# Patient Record
Sex: Male | Born: 1935 | Race: White | Hispanic: No | Smoking: Former smoker
Health system: Southern US, Community
[De-identification: ages and names within clinical notes are randomized; demographics above are authoritative.]

## PROBLEM LIST (undated history)

## (undated) DIAGNOSIS — E785 Hyperlipidemia, unspecified: Secondary | ICD-10-CM

## (undated) DIAGNOSIS — J449 Chronic obstructive pulmonary disease, unspecified: Secondary | ICD-10-CM

## (undated) DIAGNOSIS — I639 Cerebral infarction, unspecified: Secondary | ICD-10-CM

## (undated) DIAGNOSIS — I4891 Unspecified atrial fibrillation: Secondary | ICD-10-CM

## (undated) DIAGNOSIS — C61 Malignant neoplasm of prostate: Secondary | ICD-10-CM

## (undated) DIAGNOSIS — I499 Cardiac arrhythmia, unspecified: Secondary | ICD-10-CM

## (undated) DIAGNOSIS — I509 Heart failure, unspecified: Secondary | ICD-10-CM

## (undated) DIAGNOSIS — D72829 Elevated white blood cell count, unspecified: Secondary | ICD-10-CM

## (undated) DIAGNOSIS — K219 Gastro-esophageal reflux disease without esophagitis: Secondary | ICD-10-CM

## (undated) DIAGNOSIS — I10 Essential (primary) hypertension: Secondary | ICD-10-CM

## (undated) DIAGNOSIS — M1990 Unspecified osteoarthritis, unspecified site: Secondary | ICD-10-CM

## (undated) HISTORY — DX: Malignant neoplasm of prostate: C61

## (undated) HISTORY — PX: EYE SURGERY: SHX253

## (undated) HISTORY — DX: Cerebral infarction, unspecified: I63.9

## (undated) HISTORY — PX: CHOLECYSTECTOMY: SHX55

## (undated) HISTORY — DX: Elevated white blood cell count, unspecified: D72.829

## (undated) HISTORY — DX: Heart failure, unspecified: I50.9

## (undated) HISTORY — DX: Gastro-esophageal reflux disease without esophagitis: K21.9

---

## 2003-12-30 ENCOUNTER — Ambulatory Visit (HOSPITAL_COMMUNITY): Admission: RE | Admit: 2003-12-30 | Discharge: 2003-12-30 | Payer: Self-pay | Admitting: Ophthalmology

## 2003-12-31 ENCOUNTER — Emergency Department (HOSPITAL_COMMUNITY): Admission: EM | Admit: 2003-12-31 | Discharge: 2003-12-31 | Payer: Self-pay | Admitting: Emergency Medicine

## 2008-12-03 ENCOUNTER — Inpatient Hospital Stay: Payer: Self-pay | Admitting: Specialist

## 2009-10-02 ENCOUNTER — Ambulatory Visit: Payer: Self-pay | Admitting: Internal Medicine

## 2009-11-12 ENCOUNTER — Ambulatory Visit: Payer: Self-pay | Admitting: Internal Medicine

## 2010-06-29 ENCOUNTER — Emergency Department: Payer: Self-pay | Admitting: Emergency Medicine

## 2010-07-09 ENCOUNTER — Ambulatory Visit: Payer: Self-pay | Admitting: Internal Medicine

## 2010-07-14 ENCOUNTER — Ambulatory Visit: Payer: Self-pay | Admitting: Internal Medicine

## 2011-04-08 ENCOUNTER — Ambulatory Visit: Payer: Self-pay | Admitting: Internal Medicine

## 2011-04-21 ENCOUNTER — Ambulatory Visit: Payer: Self-pay | Admitting: Internal Medicine

## 2012-01-28 ENCOUNTER — Encounter (INDEPENDENT_AMBULATORY_CARE_PROVIDER_SITE_OTHER): Payer: Medicare Other | Admitting: Ophthalmology

## 2012-01-28 DIAGNOSIS — H3530 Unspecified macular degeneration: Secondary | ICD-10-CM

## 2012-01-28 DIAGNOSIS — H2510 Age-related nuclear cataract, unspecified eye: Secondary | ICD-10-CM

## 2012-01-28 DIAGNOSIS — H43819 Vitreous degeneration, unspecified eye: Secondary | ICD-10-CM

## 2012-01-31 ENCOUNTER — Ambulatory Visit: Payer: Self-pay | Admitting: Internal Medicine

## 2012-01-31 LAB — CBC CANCER CENTER
Basophil #: 0.1 x10 3/mm (ref 0.0–0.1)
Eosinophil #: 0.2 x10 3/mm (ref 0.0–0.7)
HCT: 39.3 % — ABNORMAL LOW (ref 40.0–52.0)
Lymphocyte #: 2 x10 3/mm (ref 1.0–3.6)
Lymphocyte %: 13.6 %
MCH: 32.7 pg (ref 26.0–34.0)
MCHC: 33.2 g/dL (ref 32.0–36.0)
Monocyte %: 12.1 %
Neutrophil #: 10.5 x10 3/mm — ABNORMAL HIGH (ref 1.4–6.5)
Neutrophil %: 72.5 %
Platelet: 226 x10 3/mm (ref 150–440)
RDW: 15 % — ABNORMAL HIGH (ref 11.5–14.5)

## 2012-01-31 LAB — RETICULOCYTES: Reticulocyte: 1.12 % (ref 0.5–1.5)

## 2012-02-26 ENCOUNTER — Ambulatory Visit: Payer: Self-pay | Admitting: Internal Medicine

## 2012-03-01 LAB — CBC CANCER CENTER
HCT: 37.3 % — ABNORMAL LOW (ref 40.0–52.0)
HGB: 12.2 g/dL — ABNORMAL LOW (ref 13.0–18.0)
MCH: 32 pg (ref 26.0–34.0)
MCHC: 32.7 g/dL (ref 32.0–36.0)
Platelet: 225 x10 3/mm (ref 150–440)
WBC: 17.6 x10 3/mm — ABNORMAL HIGH (ref 3.8–10.6)

## 2012-03-27 ENCOUNTER — Ambulatory Visit: Payer: Self-pay | Admitting: Internal Medicine

## 2012-07-31 ENCOUNTER — Ambulatory Visit (INDEPENDENT_AMBULATORY_CARE_PROVIDER_SITE_OTHER): Payer: Medicare Other | Admitting: Ophthalmology

## 2012-07-31 DIAGNOSIS — H3530 Unspecified macular degeneration: Secondary | ICD-10-CM

## 2012-07-31 DIAGNOSIS — H43819 Vitreous degeneration, unspecified eye: Secondary | ICD-10-CM

## 2012-07-31 DIAGNOSIS — H2510 Age-related nuclear cataract, unspecified eye: Secondary | ICD-10-CM

## 2012-07-31 DIAGNOSIS — H35349 Macular cyst, hole, or pseudohole, unspecified eye: Secondary | ICD-10-CM

## 2012-08-31 ENCOUNTER — Ambulatory Visit: Payer: Self-pay

## 2012-12-27 ENCOUNTER — Encounter (HOSPITAL_COMMUNITY): Payer: Self-pay | Admitting: Emergency Medicine

## 2012-12-27 ENCOUNTER — Emergency Department (HOSPITAL_COMMUNITY)
Admission: EM | Admit: 2012-12-27 | Discharge: 2012-12-27 | Disposition: A | Discharge status: Home / Self Care | Payer: Medicare Other | Attending: Emergency Medicine | Admitting: Emergency Medicine

## 2012-12-27 ENCOUNTER — Emergency Department (HOSPITAL_COMMUNITY): Payer: Medicare Other

## 2012-12-27 DIAGNOSIS — J449 Chronic obstructive pulmonary disease, unspecified: Secondary | ICD-10-CM | POA: Insufficient documentation

## 2012-12-27 DIAGNOSIS — J4489 Other specified chronic obstructive pulmonary disease: Secondary | ICD-10-CM | POA: Insufficient documentation

## 2012-12-27 DIAGNOSIS — Z87891 Personal history of nicotine dependence: Secondary | ICD-10-CM | POA: Insufficient documentation

## 2012-12-27 DIAGNOSIS — I4891 Unspecified atrial fibrillation: Secondary | ICD-10-CM | POA: Insufficient documentation

## 2012-12-27 DIAGNOSIS — Z9889 Other specified postprocedural states: Secondary | ICD-10-CM | POA: Insufficient documentation

## 2012-12-27 DIAGNOSIS — R000 Tachycardia, unspecified: Secondary | ICD-10-CM | POA: Insufficient documentation

## 2012-12-27 HISTORY — DX: Unspecified atrial fibrillation: I48.91

## 2012-12-27 HISTORY — DX: Chronic obstructive pulmonary disease, unspecified: J44.9

## 2012-12-27 LAB — BASIC METABOLIC PANEL
BUN: 15 mg/dL (ref 6–23)
Chloride: 105 mEq/L (ref 96–112)
Creatinine, Ser: 0.85 mg/dL (ref 0.50–1.35)
GFR calc Af Amer: >90 mL/min (ref >90)

## 2012-12-27 LAB — POCT I-STAT TROPONIN I

## 2012-12-27 LAB — CBC
HCT: 36.7 % — ABNORMAL LOW (ref 39.0–52.0)
MCV: 93.6 fL (ref 78.0–100.0)
RDW: 15.5 % (ref 11.5–15.5)
WBC: 15.7 10*3/uL — ABNORMAL HIGH (ref 4.0–10.5)

## 2012-12-27 MED ORDER — DILTIAZEM HCL 25 MG/5ML IV SOLN
20.0000 mg | Freq: Once | INTRAVENOUS | Status: AC
  Administered 2012-12-27: 20 mg via INTRAVENOUS
  Filled 2012-12-27: qty 5

## 2012-12-27 NOTE — ED Notes (Addendum)
Per EMS, patient was having L cataract surgery today when they realized he was showing AFIB on the monitor.  Patient states he has a history of afib and is on cardizem daily.   Patient said "i feel fine, I don't really know why they sent me here.  EMS advised patient is asymptomatic with AFIB.  Patient received Cardizem 10 mg IV and Metoprolol 10 mg IV while at [facility].   Lactated Ringers @ [facility] was started at surgical center.

## 2012-12-27 NOTE — ED Provider Notes (Signed)
History     CSN: [id number]  Arrival date & time 12/27/12  1103   First MD Initiated Contact with Patient 12/27/12 1106      Chief Complaint  Patient presents with  . Atrial Fibrillation    (Consider location/radiation/quality/duration/timing/severity/associated sxs/prior treatment) HPI Patient presents after a planned outpatient procedure with concern of atrial fibrillation, tachycardia. The patient had a planned cataract procedure just prior to arrival.  And progression for the procedure he took none of his typical medications.  These include diltiazem.  The patient has a diagnosis of atrial fibrillation. He states that following the procedure, which was not complicated by anything, he was told his heart rate was elevated, irregular.  He was sent here for evaluation.  He denies any complaints. Past Medical History  Diagnosis Date  . COPD (chronic obstructive pulmonary disease)   . Atrial fibrillation     History reviewed. No pertinent past surgical history.  No family history on file.  History  Substance Use Topics  . Smoking status: Former Games developer  . Smokeless tobacco: Not on file  . Alcohol Use: Yes      Review of Systems  All other systems reviewed and are negative.    Allergies  Review of patient's allergies indicates no known allergies.  Home Medications  No current outpatient prescriptions on file.  BP 123/89  Pulse 82  Resp 22  SpO2 97%  Physical Exam  Nursing note and vitals reviewed. Constitutional: He is oriented to person, place, and time. He appears well-developed. No distress.  HENT:  Head: Normocephalic and atraumatic.    Eyes: Conjunctivae and EOM are normal.  Cardiovascular: An irregularly irregular rhythm present. Tachycardia present.   Pulmonary/Chest: Effort normal. No stridor. No respiratory distress.  Abdominal: He exhibits no distension.  Musculoskeletal: He exhibits no edema.  Neurological: He is alert and oriented to person,  place, and time.  Skin: Skin is warm and dry.  Psychiatric: He has a normal mood and affect.    ED Course  Procedures (including critical care time)  Labs Reviewed  CBC - Abnormal; Notable for the following:    WBC 15.7 (*)    RBC 3.92 (*)    Hemoglobin 12.2 (*)    HCT 36.7 (*)    All other components within normal limits  BASIC METABOLIC PANEL - Abnormal; Notable for the following:    GFR calc non Af Amer 81 (*)    All other components within normal limits  POCT I-STAT TROPONIN I   No results found.   No diagnosis found.  Cardiac 110 abnormal tachycardic  Pulse ox 96% room air normal   Date: 12/27/2012  Rate: 103  Rhythm: afib  QRS Axis: right  Intervals: afib  ST/T Wave abnormalities: nonspecific ST/T changes  Conduction Disutrbances:afib  Narrative Interpretation:   Old EKG Reviewed: changes noted  Previously in SR - abnormal  1:24 PM Patient continued to deny complaints.  Heart rate in the 90s.  We reviewed all labs.  The patient requests discharge as he has another ophthalmology appointment this afternoon.  MDM  Patient presents without significant complaints, but in atrial fibrillation with tachycardia.  Notably, the patient's lack of distress, the reassuring labs, his ECG are consistent with chronic atrial fibrillation, with low suspicion for acute progression of disease.  Given the patient's preparation for his surgery with no medication use this morning, this is likely causative.  Heart rate improvement with calcium channel blocker, the patient is appropriate for discharge, per his  request, which seems reasonable.  We discussed the need for ongoing primary care/cardiology followup, and he was discharged in stable condition.      Gerhard Munch, MD 12/27/12 1325

## 2013-01-08 ENCOUNTER — Ambulatory Visit: Payer: Self-pay | Admitting: Physician Assistant

## 2013-01-29 ENCOUNTER — Ambulatory Visit (INDEPENDENT_AMBULATORY_CARE_PROVIDER_SITE_OTHER): Payer: Medicare Other | Admitting: Ophthalmology

## 2013-01-29 DIAGNOSIS — H35349 Macular cyst, hole, or pseudohole, unspecified eye: Secondary | ICD-10-CM

## 2013-01-29 DIAGNOSIS — H43819 Vitreous degeneration, unspecified eye: Secondary | ICD-10-CM

## 2013-01-29 DIAGNOSIS — H3530 Unspecified macular degeneration: Secondary | ICD-10-CM

## 2013-01-29 DIAGNOSIS — H35379 Puckering of macula, unspecified eye: Secondary | ICD-10-CM

## 2013-02-25 ENCOUNTER — Ambulatory Visit: Payer: Self-pay | Admitting: Internal Medicine

## 2013-03-27 ENCOUNTER — Ambulatory Visit: Payer: Self-pay | Admitting: Internal Medicine

## 2013-03-28 LAB — CBC CANCER CENTER
Bands: 1 %
Basophil: 1 %
HCT: 37.1 % — ABNORMAL LOW (ref 40.0–52.0)
HGB: 12.7 g/dL — ABNORMAL LOW (ref 13.0–18.0)
Monocytes: 18 %
Platelet: 187 x10 3/mm (ref 150–440)
WBC: 22 x10 3/mm — ABNORMAL HIGH (ref 3.8–10.6)

## 2013-04-27 ENCOUNTER — Ambulatory Visit: Payer: Self-pay | Admitting: Internal Medicine

## 2013-11-05 ENCOUNTER — Ambulatory Visit (INDEPENDENT_AMBULATORY_CARE_PROVIDER_SITE_OTHER): Payer: Medicare Other | Admitting: Ophthalmology

## 2013-11-19 ENCOUNTER — Ambulatory Visit (INDEPENDENT_AMBULATORY_CARE_PROVIDER_SITE_OTHER): Payer: Medicare Other | Admitting: Ophthalmology

## 2013-11-19 DIAGNOSIS — H35349 Macular cyst, hole, or pseudohole, unspecified eye: Secondary | ICD-10-CM

## 2013-11-19 DIAGNOSIS — H3530 Unspecified macular degeneration: Secondary | ICD-10-CM

## 2013-11-19 DIAGNOSIS — H43819 Vitreous degeneration, unspecified eye: Secondary | ICD-10-CM

## 2013-11-27 ENCOUNTER — Ambulatory Visit: Payer: Self-pay | Admitting: Internal Medicine

## 2013-11-27 LAB — CBC CANCER CENTER
HCT: 38.5 % — ABNORMAL LOW (ref 40.0–52.0)
HGB: 12.3 g/dL — AB (ref 13.0–18.0)
LYMPHS PCT: 10 %
MCH: 30.4 pg (ref 26.0–34.0)
MCHC: 32 g/dL (ref 32.0–36.0)
MCV: 95 fL (ref 80–100)
Monocytes: 14 %
Platelet: 195 x10 3/mm (ref 150–440)
RBC: 4.06 10*6/uL — AB (ref 4.40–5.90)
RDW: 16.8 % — ABNORMAL HIGH (ref 11.5–14.5)
Segmented Neutrophils: 76 %
WBC: 15.7 x10 3/mm — AB (ref 3.8–10.6)

## 2013-12-26 ENCOUNTER — Ambulatory Visit: Payer: Self-pay | Admitting: Internal Medicine

## 2014-03-01 ENCOUNTER — Ambulatory Visit: Payer: Self-pay | Admitting: Physician Assistant

## 2014-03-04 DIAGNOSIS — I4891 Unspecified atrial fibrillation: Secondary | ICD-10-CM | POA: Insufficient documentation

## 2014-03-04 DIAGNOSIS — J449 Chronic obstructive pulmonary disease, unspecified: Secondary | ICD-10-CM | POA: Insufficient documentation

## 2014-03-04 DIAGNOSIS — I5032 Chronic diastolic (congestive) heart failure: Secondary | ICD-10-CM | POA: Insufficient documentation

## 2014-03-04 DIAGNOSIS — K219 Gastro-esophageal reflux disease without esophagitis: Secondary | ICD-10-CM | POA: Insufficient documentation

## 2014-03-18 ENCOUNTER — Ambulatory Visit: Payer: Self-pay | Admitting: Physician Assistant

## 2014-03-20 ENCOUNTER — Ambulatory Visit: Payer: Self-pay | Admitting: Internal Medicine

## 2014-03-20 LAB — CBC CANCER CENTER
Bands: 1 %
Basophil: 1 %
Eosinophil: 1 %
HCT: 31.2 % — ABNORMAL LOW (ref 40.0–52.0)
HGB: 10.4 g/dL — AB (ref 13.0–18.0)
Lymphocytes: 7 %
MCH: 31.6 pg (ref 26.0–34.0)
MCHC: 33.4 g/dL (ref 32.0–36.0)
MCV: 95 fL (ref 80–100)
Monocytes: 16 %
Platelet: 227 x10 3/mm (ref 150–440)
RBC: 3.29 10*6/uL — ABNORMAL LOW (ref 4.40–5.90)
RDW: 16.1 % — AB (ref 11.5–14.5)
Segmented Neutrophils: 72 %
Variant Lymphocyte: 2 %
WBC: 15.5 x10 3/mm — ABNORMAL HIGH (ref 3.8–10.6)

## 2014-03-27 ENCOUNTER — Ambulatory Visit: Payer: Self-pay | Admitting: Internal Medicine

## 2014-04-09 ENCOUNTER — Ambulatory Visit: Payer: Self-pay | Admitting: Physician Assistant

## 2014-07-22 ENCOUNTER — Ambulatory Visit: Payer: Self-pay | Admitting: Internal Medicine

## 2014-07-22 LAB — CBC CANCER CENTER
BASOS PCT: 0.4 %
Basophil #: 0.1 x10 3/mm (ref 0.0–0.1)
EOS ABS: 0 x10 3/mm (ref 0.0–0.7)
Eosinophil %: 0.2 %
HCT: 34.4 % — AB (ref 40.0–52.0)
HGB: 10.9 g/dL — ABNORMAL LOW (ref 13.0–18.0)
LYMPHS ABS: 1 x10 3/mm (ref 1.0–3.6)
Lymphocyte %: 3.8 %
MCH: 30.5 pg (ref 26.0–34.0)
MCHC: 31.7 g/dL — AB (ref 32.0–36.0)
MCV: 96 fL (ref 80–100)
MONO ABS: 1.6 x10 3/mm — AB (ref 0.2–1.0)
MONOS PCT: 5.8 %
NEUTROS PCT: 89.8 %
Neutrophil #: 24.8 x10 3/mm — ABNORMAL HIGH (ref 1.4–6.5)
Platelet: 299 x10 3/mm (ref 150–440)
RBC: 3.56 10*6/uL — ABNORMAL LOW (ref 4.40–5.90)
RDW: 17.9 % — AB (ref 11.5–14.5)
WBC: 27.6 x10 3/mm — ABNORMAL HIGH (ref 3.8–10.6)

## 2014-07-22 LAB — RETICULOCYTES
Absolute Retic Count: 0.0763 10*6/uL (ref 0.019–0.186)
RETICULOCYTE: 2.14 % (ref 0.4–3.1)

## 2014-07-22 LAB — IRON AND TIBC
IRON SATURATION: 18 %
IRON: 52 ug/dL — AB (ref 65–175)
Iron Bind.Cap.(Total): 297 ug/dL (ref 250–450)
UNBOUND IRON-BIND. CAP.: 245 ug/dL

## 2014-07-22 LAB — FOLATE: FOLIC ACID: 29 ng/mL (ref 3.1–100.0)

## 2014-07-22 LAB — FERRITIN: Ferritin (ARMC): 120 ng/mL (ref 8–388)

## 2014-07-23 LAB — PROT IMMUNOELECTROPHORES(ARMC)

## 2014-07-28 ENCOUNTER — Ambulatory Visit: Payer: Self-pay | Admitting: Internal Medicine

## 2014-08-19 ENCOUNTER — Ambulatory Visit (INDEPENDENT_AMBULATORY_CARE_PROVIDER_SITE_OTHER): Payer: Medicare Other | Admitting: Ophthalmology

## 2014-08-19 DIAGNOSIS — H43813 Vitreous degeneration, bilateral: Secondary | ICD-10-CM

## 2014-08-19 DIAGNOSIS — H3531 Nonexudative age-related macular degeneration: Secondary | ICD-10-CM | POA: Diagnosis not present

## 2014-08-19 DIAGNOSIS — H35341 Macular cyst, hole, or pseudohole, right eye: Secondary | ICD-10-CM

## 2014-08-27 ENCOUNTER — Other Ambulatory Visit: Payer: Self-pay | Admitting: Orthopaedic Surgery

## 2014-08-27 DIAGNOSIS — M79631 Pain in right forearm: Secondary | ICD-10-CM

## 2014-10-01 DIAGNOSIS — M1990 Unspecified osteoarthritis, unspecified site: Secondary | ICD-10-CM | POA: Diagnosis not present

## 2014-10-01 DIAGNOSIS — M25512 Pain in left shoulder: Secondary | ICD-10-CM | POA: Diagnosis not present

## 2014-10-01 DIAGNOSIS — M79641 Pain in right hand: Secondary | ICD-10-CM | POA: Diagnosis not present

## 2014-10-01 DIAGNOSIS — M79642 Pain in left hand: Secondary | ICD-10-CM | POA: Diagnosis not present

## 2014-10-16 DIAGNOSIS — M0579 Rheumatoid arthritis with rheumatoid factor of multiple sites without organ or systems involvement: Secondary | ICD-10-CM | POA: Diagnosis not present

## 2014-10-16 DIAGNOSIS — M25512 Pain in left shoulder: Secondary | ICD-10-CM | POA: Diagnosis not present

## 2014-11-12 DIAGNOSIS — M064 Inflammatory polyarthropathy: Secondary | ICD-10-CM | POA: Diagnosis not present

## 2014-11-12 DIAGNOSIS — M0579 Rheumatoid arthritis with rheumatoid factor of multiple sites without organ or systems involvement: Secondary | ICD-10-CM | POA: Diagnosis not present

## 2014-11-12 DIAGNOSIS — C9290 Myeloid leukemia, unspecified, not having achieved remission: Secondary | ICD-10-CM | POA: Diagnosis not present

## 2014-11-12 DIAGNOSIS — J449 Chronic obstructive pulmonary disease, unspecified: Secondary | ICD-10-CM | POA: Diagnosis not present

## 2014-11-12 DIAGNOSIS — I482 Chronic atrial fibrillation: Secondary | ICD-10-CM | POA: Diagnosis not present

## 2014-11-12 DIAGNOSIS — I10 Essential (primary) hypertension: Secondary | ICD-10-CM | POA: Diagnosis not present

## 2014-11-18 DIAGNOSIS — D72828 Other elevated white blood cell count: Secondary | ICD-10-CM | POA: Diagnosis not present

## 2014-11-18 DIAGNOSIS — D72829 Elevated white blood cell count, unspecified: Secondary | ICD-10-CM | POA: Insufficient documentation

## 2014-11-18 DIAGNOSIS — M0579 Rheumatoid arthritis with rheumatoid factor of multiple sites without organ or systems involvement: Secondary | ICD-10-CM | POA: Diagnosis not present

## 2014-11-18 DIAGNOSIS — M25511 Pain in right shoulder: Secondary | ICD-10-CM | POA: Diagnosis not present

## 2014-11-18 DIAGNOSIS — Z79899 Other long term (current) drug therapy: Secondary | ICD-10-CM | POA: Diagnosis not present

## 2014-12-10 DIAGNOSIS — Z79899 Other long term (current) drug therapy: Secondary | ICD-10-CM | POA: Diagnosis not present

## 2014-12-10 DIAGNOSIS — M0579 Rheumatoid arthritis with rheumatoid factor of multiple sites without organ or systems involvement: Secondary | ICD-10-CM | POA: Diagnosis not present

## 2014-12-13 ENCOUNTER — Ambulatory Visit
Admit: 2014-12-13 | Disposition: A | Discharge status: Home / Self Care | Payer: Self-pay | Attending: Internal Medicine | Admitting: Internal Medicine

## 2014-12-13 DIAGNOSIS — D649 Anemia, unspecified: Secondary | ICD-10-CM | POA: Diagnosis not present

## 2014-12-13 DIAGNOSIS — D72829 Elevated white blood cell count, unspecified: Secondary | ICD-10-CM | POA: Diagnosis not present

## 2014-12-17 DIAGNOSIS — D72828 Other elevated white blood cell count: Secondary | ICD-10-CM | POA: Diagnosis not present

## 2014-12-17 DIAGNOSIS — I70209 Unspecified atherosclerosis of native arteries of extremities, unspecified extremity: Secondary | ICD-10-CM | POA: Diagnosis not present

## 2014-12-17 DIAGNOSIS — Z79899 Other long term (current) drug therapy: Secondary | ICD-10-CM | POA: Diagnosis not present

## 2014-12-17 DIAGNOSIS — M0579 Rheumatoid arthritis with rheumatoid factor of multiple sites without organ or systems involvement: Secondary | ICD-10-CM | POA: Diagnosis not present

## 2014-12-27 ENCOUNTER — Ambulatory Visit
Admit: 2014-12-27 | Disposition: A | Discharge status: Home / Self Care | Payer: Self-pay | Attending: Internal Medicine | Admitting: Internal Medicine

## 2015-01-20 DIAGNOSIS — R609 Edema, unspecified: Secondary | ICD-10-CM | POA: Diagnosis not present

## 2015-01-20 DIAGNOSIS — I482 Chronic atrial fibrillation: Secondary | ICD-10-CM | POA: Diagnosis not present

## 2015-01-20 DIAGNOSIS — Z5181 Encounter for therapeutic drug level monitoring: Secondary | ICD-10-CM | POA: Diagnosis not present

## 2015-01-20 DIAGNOSIS — J449 Chronic obstructive pulmonary disease, unspecified: Secondary | ICD-10-CM | POA: Diagnosis not present

## 2015-01-21 DIAGNOSIS — E869 Volume depletion, unspecified: Secondary | ICD-10-CM | POA: Diagnosis not present

## 2015-01-21 DIAGNOSIS — R509 Fever, unspecified: Secondary | ICD-10-CM | POA: Diagnosis not present

## 2015-01-21 DIAGNOSIS — Z7982 Long term (current) use of aspirin: Secondary | ICD-10-CM | POA: Diagnosis not present

## 2015-01-21 DIAGNOSIS — J449 Chronic obstructive pulmonary disease, unspecified: Secondary | ICD-10-CM | POA: Diagnosis not present

## 2015-01-21 DIAGNOSIS — Z87891 Personal history of nicotine dependence: Secondary | ICD-10-CM | POA: Diagnosis not present

## 2015-01-21 DIAGNOSIS — K529 Noninfective gastroenteritis and colitis, unspecified: Secondary | ICD-10-CM | POA: Diagnosis not present

## 2015-01-21 DIAGNOSIS — D72828 Other elevated white blood cell count: Secondary | ICD-10-CM | POA: Diagnosis not present

## 2015-01-21 DIAGNOSIS — J9811 Atelectasis: Secondary | ICD-10-CM | POA: Diagnosis not present

## 2015-01-21 DIAGNOSIS — R404 Transient alteration of awareness: Secondary | ICD-10-CM | POA: Diagnosis not present

## 2015-01-21 DIAGNOSIS — Z833 Family history of diabetes mellitus: Secondary | ICD-10-CM | POA: Diagnosis not present

## 2015-01-21 DIAGNOSIS — R5383 Other fatigue: Secondary | ICD-10-CM | POA: Diagnosis not present

## 2015-01-21 DIAGNOSIS — Z8249 Family history of ischemic heart disease and other diseases of the circulatory system: Secondary | ICD-10-CM | POA: Diagnosis not present

## 2015-01-21 DIAGNOSIS — R4182 Altered mental status, unspecified: Secondary | ICD-10-CM | POA: Diagnosis not present

## 2015-01-21 DIAGNOSIS — R531 Weakness: Secondary | ICD-10-CM | POA: Diagnosis not present

## 2015-01-21 DIAGNOSIS — Z8546 Personal history of malignant neoplasm of prostate: Secondary | ICD-10-CM | POA: Diagnosis not present

## 2015-01-21 DIAGNOSIS — Z79899 Other long term (current) drug therapy: Secondary | ICD-10-CM | POA: Diagnosis not present

## 2015-01-21 DIAGNOSIS — R197 Diarrhea, unspecified: Secondary | ICD-10-CM | POA: Diagnosis not present

## 2015-01-21 DIAGNOSIS — D649 Anemia, unspecified: Secondary | ICD-10-CM | POA: Diagnosis not present

## 2015-01-21 DIAGNOSIS — I482 Chronic atrial fibrillation: Secondary | ICD-10-CM | POA: Diagnosis not present

## 2015-01-21 LAB — CBC WITH DIFFERENTIAL/PLATELET
BASOS ABS: 0.1 10*3/uL (ref 0.0–0.1)
Basophil %: 0.3 %
EOS PCT: 0.2 %
Eosinophil #: 0.1 10*3/uL (ref 0.0–0.7)
HCT: 27.1 % — AB (ref 40.0–52.0)
HGB: 8.5 g/dL — ABNORMAL LOW (ref 13.0–18.0)
LYMPHS ABS: 1.1 10*3/uL (ref 1.0–3.6)
Lymphocyte %: 2.6 %
MCH: 30.9 pg (ref 26.0–34.0)
MCHC: 31.4 g/dL — ABNORMAL LOW (ref 32.0–36.0)
MCV: 98 fL (ref 80–100)
MONOS PCT: 15.7 %
Monocyte #: 6.5 x10 3/mm — ABNORMAL HIGH (ref 0.2–1.0)
NEUTROS ABS: 33.7 10*3/uL — AB (ref 1.4–6.5)
Neutrophil %: 81.2 %
PLATELETS: 284 10*3/uL (ref 150–440)
RBC: 2.76 10*6/uL — ABNORMAL LOW (ref 4.40–5.90)
RDW: 19.5 % — ABNORMAL HIGH (ref 11.5–14.5)
WBC: 41.5 10*3/uL — AB (ref 3.8–10.6)

## 2015-01-21 LAB — COMPREHENSIVE METABOLIC PANEL
ALK PHOS: 86 U/L
ANION GAP: 8 (ref 7–16)
Albumin: 3.3 g/dL — ABNORMAL LOW
BILIRUBIN TOTAL: 0.9 mg/dL
BUN: 18 mg/dL
CO2: 26 mmol/L
CREATININE: 0.99 mg/dL
Calcium, Total: 8.1 mg/dL — ABNORMAL LOW
Chloride: 104 mmol/L
EGFR (African American): >60
GLUCOSE: 96 mg/dL
Potassium: 3.6 mmol/L
SGOT(AST): 20 U/L
SGPT (ALT): 6 U/L — ABNORMAL LOW
Sodium: 138 mmol/L
Total Protein: 6.8 g/dL

## 2015-01-21 LAB — LACTIC ACID, PLASMA: LACTIC ACID, VENOUS: 1.3 mmol/L

## 2015-01-21 LAB — LIPASE, BLOOD: Lipase: 22 U/L

## 2015-01-21 LAB — DIGOXIN LEVEL: Digoxin: 1 ng/mL

## 2015-01-21 LAB — TROPONIN I: Troponin-I: <0.03 ng/mL

## 2015-01-22 ENCOUNTER — Observation Stay
Admit: 2015-01-22 | Disposition: A | Discharge status: Home / Self Care | Payer: Self-pay | Attending: Internal Medicine | Admitting: Internal Medicine

## 2015-01-22 DIAGNOSIS — R509 Fever, unspecified: Secondary | ICD-10-CM | POA: Diagnosis not present

## 2015-01-22 DIAGNOSIS — R4182 Altered mental status, unspecified: Secondary | ICD-10-CM | POA: Diagnosis not present

## 2015-01-22 DIAGNOSIS — I482 Chronic atrial fibrillation: Secondary | ICD-10-CM | POA: Diagnosis not present

## 2015-01-22 DIAGNOSIS — R531 Weakness: Secondary | ICD-10-CM | POA: Diagnosis not present

## 2015-01-22 DIAGNOSIS — R112 Nausea with vomiting, unspecified: Secondary | ICD-10-CM | POA: Diagnosis not present

## 2015-01-22 DIAGNOSIS — D72829 Elevated white blood cell count, unspecified: Secondary | ICD-10-CM | POA: Diagnosis not present

## 2015-01-22 LAB — BASIC METABOLIC PANEL
ANION GAP: 9 (ref 7–16)
BUN: 16 mg/dL
CALCIUM: 7.7 mg/dL — AB
CHLORIDE: 106 mmol/L
CO2: 25 mmol/L
Creatinine: 0.88 mg/dL
GLUCOSE: 89 mg/dL
POTASSIUM: 3.7 mmol/L
Sodium: 140 mmol/L

## 2015-01-22 LAB — CBC WITH DIFFERENTIAL/PLATELET
BASOS PCT: 0.3 %
Basophil #: 0.1 10*3/uL (ref 0.0–0.1)
EOS ABS: 0.1 10*3/uL (ref 0.0–0.7)
EOS PCT: 0.2 %
HCT: 24.7 % — AB (ref 40.0–52.0)
HGB: 7.7 g/dL — AB (ref 13.0–18.0)
LYMPHS PCT: 3 %
Lymphocyte #: 0.9 10*3/uL — ABNORMAL LOW (ref 1.0–3.6)
MCH: 30.5 pg (ref 26.0–34.0)
MCHC: 31 g/dL — AB (ref 32.0–36.0)
MCV: 98 fL (ref 80–100)
MONOS PCT: 16.9 %
Monocyte #: 5.3 x10 3/mm — ABNORMAL HIGH (ref 0.2–1.0)
NEUTROS ABS: 24.9 10*3/uL — AB (ref 1.4–6.5)
Neutrophil %: 79.6 %
Platelet: 245 10*3/uL (ref 150–440)
RBC: 2.51 10*6/uL — ABNORMAL LOW (ref 4.40–5.90)
RDW: 19.6 % — ABNORMAL HIGH (ref 11.5–14.5)
WBC: 31.3 10*3/uL — ABNORMAL HIGH (ref 3.8–10.6)

## 2015-01-23 DIAGNOSIS — K529 Noninfective gastroenteritis and colitis, unspecified: Secondary | ICD-10-CM | POA: Diagnosis not present

## 2015-01-23 DIAGNOSIS — I4891 Unspecified atrial fibrillation: Secondary | ICD-10-CM | POA: Diagnosis not present

## 2015-01-23 DIAGNOSIS — D72829 Elevated white blood cell count, unspecified: Secondary | ICD-10-CM | POA: Diagnosis not present

## 2015-01-23 DIAGNOSIS — D649 Anemia, unspecified: Secondary | ICD-10-CM | POA: Diagnosis not present

## 2015-01-23 LAB — CBC WITH DIFFERENTIAL/PLATELET
Basophil #: 0.1 10*3/uL (ref 0.0–0.1)
Basophil %: 0.3 %
EOS ABS: 0 10*3/uL (ref 0.0–0.7)
Eosinophil %: 0 %
HCT: 24.7 % — ABNORMAL LOW (ref 40.0–52.0)
HGB: 7.9 g/dL — ABNORMAL LOW (ref 13.0–18.0)
Lymphocyte #: 0.9 10*3/uL — ABNORMAL LOW (ref 1.0–3.6)
Lymphocyte %: 2.9 %
MCH: 31.3 pg (ref 26.0–34.0)
MCHC: 31.9 g/dL — AB (ref 32.0–36.0)
MCV: 98 fL (ref 80–100)
Monocyte #: 7.3 x10 3/mm — ABNORMAL HIGH (ref 0.2–1.0)
Monocyte %: 22.8 %
NEUTROS PCT: 74 %
Neutrophil #: 23.5 10*3/uL — ABNORMAL HIGH (ref 1.4–6.5)
Platelet: 214 10*3/uL (ref 150–440)
RBC: 2.52 10*6/uL — AB (ref 4.40–5.90)
RDW: 20.1 % — ABNORMAL HIGH (ref 11.5–14.5)
WBC: 31.8 10*3/uL — AB (ref 3.8–10.6)

## 2015-01-23 LAB — IRON AND TIBC
IRON BIND. CAP.(TOTAL): 197 — AB (ref 250–450)
IRON: 12 ug/dL — AB
Iron Saturation: 6.1
UNBOUND IRON-BIND. CAP.: 184.7

## 2015-01-23 LAB — OCCULT BLOOD X 1 CARD TO LAB, STOOL: Occult Blood, Feces: POSITIVE

## 2015-01-23 LAB — CULTURE, BLOOD (SINGLE)

## 2015-01-23 LAB — FOLATE: Folic Acid: 30 ng/mL

## 2015-01-24 DIAGNOSIS — I4891 Unspecified atrial fibrillation: Secondary | ICD-10-CM | POA: Diagnosis not present

## 2015-01-24 DIAGNOSIS — F17210 Nicotine dependence, cigarettes, uncomplicated: Secondary | ICD-10-CM | POA: Diagnosis not present

## 2015-01-24 DIAGNOSIS — K529 Noninfective gastroenteritis and colitis, unspecified: Secondary | ICD-10-CM | POA: Diagnosis not present

## 2015-01-24 DIAGNOSIS — J449 Chronic obstructive pulmonary disease, unspecified: Secondary | ICD-10-CM | POA: Diagnosis not present

## 2015-01-24 DIAGNOSIS — D649 Anemia, unspecified: Secondary | ICD-10-CM | POA: Diagnosis not present

## 2015-01-26 NOTE — Consult Note (Signed)
PATIENT NAME:  Thomas Townsend, Thomas Townsend MR#:  [id number] DATE OF BIRTH:  22-Aug-1931  DATE OF CONSULTATION:  01/22/2015  REFERRING PHYSICIAN:  Dr. Bridgett Larsson  CONSULTING PHYSICIAN:  Kristjan Derner R. Ma Hillock, MD  REASON FOR CONSULTATION:  History of chronic leukocytosis, possibly CMML admitted with altered mental status, low grade fever.   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 The patient currently is weak and resting in bed, family present at bedside states that he has been resting but had been having altered mental status on and off for the last 1 to 2 days, mostly mild confusion. Temperature has been up to 100.9 since admission. Urinalysis has not been sent yet. Blood cultures are negative so far. The patient has chronic leukocytosis but this was higher upon admission up to 41.5, hemoglobin was 8.5, and platelets 284,000. Repeat CBC today showed WBC count slightly better at par31.3K, hemoglobin 7.7, platelets unremarkable. The patient denies any pain. Denies any obvious bleeding issues.   PAST MEDICAL HISTORY AND SURGICAL HISTORY: As in HPI above. In addition, he had bone marrow biopsy done around June 2013, which was normocellular to mildly hypocellular marrow 30 to 50% with no overt monocytosis, cytogenetics, and flow cytometry unremarkable.   ALLERGIES: ? ANESTHETIC.   HOME MEDICATIONS: Aspirin 81 mg daily, Centrum Silver 1 tablet daily, digoxin 125 mcg once daily, fluticasone inhaler once daily, montelukast 10 mg daily, meloxicam 7.5 mg b.i.d. p.r.n., Lasix 20 mg once daily, folic acid 1 mg daily.   REVIEW OF SYSTEMS: Limited. The patient  is resting but arousable. He denies any obvious pain. Denies any bleeding issues. Denies any shortness of breath at rest. No chest pain. No diarrhea.   PHYSICAL EXAMINATION:  GENERAL: The patient is resting in bed, lethargic but arousable and answers simple questions appropriately. No acute distress. No icterus.  VITAL SIGNS: T-max 100.9, T-current 98. RR 16, BP 110/49, 90% on room air.  HEENT: Normocephalic, atraumatic. Extraocular movements intact.  NECK: Negative for lymphadenopathy.  CARDIOVASCULAR: S1, S2, regular.  LUNGS: Bilateral diminished breath sounds overall, no rhonchi.  ABDOMEN: Soft, nontender, bowel sounds present, no hepatosplenomegaly clinically.  EXTREMITIES: Trace edema. No cyanosis.  SKIN: No generalized rashes or major bruising.  NEUROLOGIC: Limited exam. Moves all extremities spontaneously.   LABORATORY RESULTS: WBC today 31.3K, ANC par 24000, absolute monocyte count elevated at 5300, platelets normal range, hemoglobin 7.7, creatinine 0.88, calcium 7.7. Blood culture negative so far. UA is still pending. Abdominal 3-way x-ray including chest reports: COPD and left lung base atelectasis/scarring. Normal bowel gas pattern.   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 The patient is currently admitted with GI symptoms and low grade fever along with mild confusion. No obvious infection, blood culture remains negative but urinalysis still pending. Given his significant worsened leukocytosis with neutrophilia along with low-grade fever and altered mental status, will also add empiric antibiotic with levofloxacin 500 mg IV daily for occult infection. Continue supportive treatment. If altered mental status does nor improve, consider CT scan of brain. The  patient also has progressive anemia with hemoglobin lower  than his previous values. No obvious bleeding. We will get preliminary anemia workup including iron and TIBC, B12 and folate, direct Coombs test, SIEP, stool Hemoccult. We will continue to follow. If he still has progressive anemia along with significant leukocytosis despite improvement in acute illness, then may need to consider repeat bone marrow biopsy evaluation to see if he has developed more definitive chronic myelomonocytic leukemia or other marrow disorder. The patient and family present explained above, they are agreeable to this plan.   Thank you for the referral. Please feel free to contact me for additional questions.     ____________________________ Rhett Bannister Ma Hillock, MD srp:AT D: 01/22/2015 23:12:34 ET T: 01/22/2015 23:54:33 ET JOB#: [id number]  cc: Trevor Iha R. Ma Hillock, MD, <Dictator> Alveta Heimlich MD ELECTRONICALLY SIGNED 01/23/2015 6:32

## 2015-01-26 NOTE — Consult Note (Signed)
Hb low but steady at 7.3, iron study shows low TIBC s/o ACD but the iron saturation also is very low at <10% indicative of iron deficiency. He also has Stool hemoccult positive.d/w hospitalist Dr.Chen, he will get GI appt as outpatient. Otherwise for iron deficiency, plan is to give IV Venofer 500 mg x 1 today over 4 hours. If no side effects, then he may be discharged from Hematology standpoint and will followup CBC as oputpatient.  Electronic Signatures: Jonn Shingles (MD)  (Signed on 28-Apr-16 10:21)  Authored  Last Updated: 28-Apr-16 10:21 by Jonn Shingles (MD)

## 2015-01-26 NOTE — Discharge Summary (Signed)
PATIENT NAME:  Thomas Townsend, Thomas Townsend MR#:  [id number] DATE OF BIRTH:  [birth year]  DATE OF ADMISSION:  01/22/2015 DATE OF DISCHARGE:  01/23/2015  PRIMARY CARE PHYSICIAN:  Dr.  Clayborn Bigness.   DISCHARGE DIAGNOSES:  1. Acute gastroenteritis.  2. Generalized weakness.  3. Leukocytosis and possible chronic myelogenous leukemia.  4. Anemia.  5.  Positive fecal occult blood test.   6. Atrial fibrillation. 7. Chronic obstructive pulmonary disease.   CONDITION:  Stable.   CODE STATUS:  Full code.   HOME MEDICATIONS:  Please refer to the medication reconciliation list.  Aspirin and  meloxicam discontinued due to positive stool occult.   DISCHARGE SERVICES:  Patient needs home health with PT and RN.   DIET:  Low-sodium, low-fat, low-cholesterol diet.   ACTIVITY:  As tolerated.   FOLLOWUP CARE:  Follow up with PCP and Dr. Tiffany Kocher within 1-2 weeks.  Follow up with Dr. Ma Hillock within 1-2 days.    REASON FOR ADMISSION:  Nausea and vomiting.   HOSPITAL COURSE:  The patient is an 81 year old Caucasian male with a history of AFib, not on anticoagulation due to [facility], who came to the ED due to nausea and vomiting.  For detailed history and physical examination, please refer to the admission note dictated by Dr. Lavetta Nielsen.  On admission date, the patient's WBC was 41.5, hemoglobin 8, platelets 284,000, BUN 18, creatinine 0.99.   The patient was admitted for intractable nausea, vomiting due to gastroenteritis. After admission, the patient was treated with IV fluid rehydration.  Lasix was on hold.  In addition, the patient was treated with Zofran p.r.n. for nausea and vomiting.  The patient only had mild nausea after admission.  The patient denies any fever or chills.  The patient has no active diarrhea.  His symptoms have much improved, but the patient has generalized weakness. According to PT evaluation, the patient needs home health with physical therapy.   For AFib, the patient is on digoxin.  Heart rate is  controlled.   Anemia:  The patient's hemoglobin decreased from 8.5 to 7.7, and Dr. Ma Hillock suggested  IV iron  infusion today.  In addition, the patient's leukocytosis decreased  to 31.8.  The patient has been treated with Cipro according to Dr. Beverly Gust suggestion.  He will continue.  The patient has a history of questionable CML but at this time, due to increased leukocytosis, Dr. Ma Hillock suggested that the patient needs Cipro for 5 days.   The patient's symptoms have much improved.  Vital signs are stable.  He is clinically stable.  Will be discharged to home with home health and PT today.  I discussed the patient's discharge plan with the patient, the patient's wife, nurse, case manager, and Dr. Ma Hillock.    TIME SPENT:  About 41 minutes.    ____________________________ Demetrios Loll, MD qc:kc D: 01/23/2015 14:31:04 ET T: 01/23/2015 22:47:47 ET JOB#: [id number]  cc: Demetrios Loll, MD, <Dictator> Demetrios Loll MD ELECTRONICALLY SIGNED 01/24/2015 10:43

## 2015-01-26 NOTE — H&P (Signed)
PATIENT NAME:  Thomas Townsend, Thomas Townsend MR#:  [id number] DATE OF BIRTH:  03-01-1931  DATE OF ADMISSION:  01/22/2015    PRIMARY CARE PHYSICIAN: Lavera Guise, MD    CHIEF COMPLAINT: Nausea and vomiting.   HISTORY OF PRESENT ILLNESS: This is an 81 year old Caucasian gentleman with past medical history of chronic atrial fibrillation but is not on anticoagulants, possible CML, rheumatoid arthritis, COPD, non-O2 dependent, presenting with nausea and vomiting. The patient is a poor historian. History aided from family who is present at bedside. They described a 1 day duration of nausea and vomiting. He denies having any further symptomatology including abdominal pain or fevers, chills. Given his persistent nausea and vomiting, he has become progressively weak and having difficulty with ambulation. Family states they also noted mild confusion thus prompted presentation to the hospital for further workup and evaluation. Despite receiving some Zofran in the Emergency Department, still symptomatic.   REVIEW OF SYSTEMS:  Question validity of statements given patient's mental status, however:   CONSTITUTIONAL: Denies fevers, chills. Positive for fatigue, weakness.  EYES: Denies blurred vision, double vision, eye pain.  EARS, NOSE, THROAT: Denies tinnitus, ear pain, hearing loss.  RESPIRATORY:  Denies cough, wheeze, shortness of breath.    CARDIOVASCULAR: Denies chest pain, palpitations. Positive for edema, which has been present for about a month or so.  GASTROINTESTINAL: Positive nausea, vomiting. He denies any diarrhea or constipation.  GENITOURINARY: Denies any dysuria or hematuria.  ENDOCRINE: Denies nocturia or thyroid problems. HEMATOLOGIC AND LYMPHATIC:  Denies easy bruising or bleeding.  SKIN: Denies rash or lesion.  MUSCULOSKELETAL: Denies pain in neck, back, shoulder, knees, hips or arthritic symptoms.  NEUROLOGIC: He denies paralysis or paresthesias.  PSYCHIATRIC: Denies anxiety or depressive  symptoms.  Otherwise full review of systems performed by me is negative.    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     SOCIAL HISTORY: Remote tobacco use. Denies any alcohol or drug use.   FAMILY HISTORY: Positive for diabetes and hypertension.   ALLERGIES: No known drug allergies.   HOME MEDICATIONS: Include meloxicam 7.5 mg p.o. b.i.d., which is as needed for pain, just started prior to symptoms, digoxin 125 mcg p.o. q. daily, fluticasone/vilanterol 100/25 mcg inhalation 1 puff daily, Lasix 20 mg p.o.  just started 2 days ago, montelukast 10 mg p.o. q. daily, multivitamin 1 tablet daily, folic acid 1 mg daily, aspirin 81 mg p.o. q. daily.   PHYSICAL EXAMINATION:  VITAL SIGNS: Temperature 98.4, heart rate of 83, respirations 18, blood pressure 111/68, saturating 94% on room air, weight is 73.5 kg, BMI 25.4.  GENERAL: Weak, frail-appearing Caucasian gentleman currently in no acute distress.  HEAD: Normocephalic, atraumatic.  EYES: Pupils equal, round, react to light. Extraocular muscles intact. No scleral icterus.  MOUTH: Dry mucosal membrane. Dentition poor. No abscess noted.  EAR, NOSE, THROAT: Clear without exudates. No external lesions.  NECK: Supple. No thyromegaly. No nodules. No JVD.  PULMONARY: Clear to auscultation bilaterally without wheezes, rales, or rhonchi. No use of accessory muscles. Good respiratory effort.  CHEST: Nontender to palpation.  CARDIOVASCULAR: S1, S2, irregular rate, irregular rhythm. No murmurs, rubs, or gallops. No edema. Pedal pulses 2+ bilaterally. GASTROINTESTINAL: Soft, nontender, nondistended. No masses. Positive bowel sounds. No hepatosplenomegaly.  MUSCULOSKELETAL: No swelling, clubbing, or edema Range of motion full in all extremities.  NEUROLOGIC: Cranial nerves II  through XII intact. No  gross focal neurological deficits. Sensation intact. Reflexes intact.  SKIN: No ulceration, lesions, rash, or cyanosis. Skin warm and dry. Turgor intact.  PSYCHIATRIC: Mood and affect blunted. He is awake, alert, and oriented x 3, however, has some difficulty responding to questions and takes an extended period of time to essentially come up with an answer. Insight and judgment appear to be intact.    LABORATORY DATA: 138, potassium 3.6, chloride 104, bicarbonate 26, BUN 18, creatinine 0.99, , albumin of 3.3, otherwise, LFTs within normal limits. Digoxin 1, WBC of 41.5, hemoglobin 8, platelets of 284,000.   ASSESSMENT AND PLAN: An 81 year old gentleman with a history of atrial fibrillation as well as possible chronic myelogenous leukemia presenting with nausea and vomiting.  1.  Intractable nausea and vomiting. Provide gentle IV fluid hydration. We will hold off on further diuretics for now. Continue with p.r.n. medications as needed for nausea.  2.  Generalized weakness. We will get physical therapy evaluation prior to his discharge.  3.  Atrial fibrillation, which is chronic. Currently rate controlled. Continue with digoxin.  4.  Venous thromboembolism prophylaxis, heparin subcutaneous.   CODE STATUS: The patient is full code.   TIME SPENT: 35 minutes.    ____________________________ Aaron Mose. Gillie Crisci, MD dkh:AT D: 01/22/2015 00:40:49 ET T: 01/22/2015 01:12:51 ET JOB#: [id number]  cc: Aaron Mose. Mickie Badders, MD, <Dictator> Rachel Rison Woodfin Ganja MD ELECTRONICALLY SIGNED 01/23/2015 10:49

## 2015-01-27 ENCOUNTER — Other Ambulatory Visit: Payer: Self-pay | Admitting: *Deleted

## 2015-01-27 DIAGNOSIS — D72821 Monocytosis (symptomatic): Secondary | ICD-10-CM

## 2015-01-27 DIAGNOSIS — D649 Anemia, unspecified: Secondary | ICD-10-CM

## 2015-01-27 DIAGNOSIS — D729 Disorder of white blood cells, unspecified: Secondary | ICD-10-CM

## 2015-01-27 DIAGNOSIS — D72829 Elevated white blood cell count, unspecified: Secondary | ICD-10-CM

## 2015-01-27 LAB — PROT IMMUNOELECTROPHORES(ARMC)

## 2015-01-28 ENCOUNTER — Inpatient Hospital Stay (HOSPITAL_BASED_OUTPATIENT_CLINIC_OR_DEPARTMENT_OTHER): Payer: Medicare Other | Admitting: Internal Medicine

## 2015-01-28 ENCOUNTER — Encounter: Payer: Self-pay | Admitting: Internal Medicine

## 2015-01-28 ENCOUNTER — Inpatient Hospital Stay: Payer: Medicare Other | Attending: Internal Medicine | Admitting: Internal Medicine

## 2015-01-28 ENCOUNTER — Inpatient Hospital Stay: Payer: Medicare Other

## 2015-01-28 DIAGNOSIS — Z8546 Personal history of malignant neoplasm of prostate: Secondary | ICD-10-CM | POA: Diagnosis not present

## 2015-01-28 DIAGNOSIS — Z87891 Personal history of nicotine dependence: Secondary | ICD-10-CM

## 2015-01-28 DIAGNOSIS — D649 Anemia, unspecified: Secondary | ICD-10-CM | POA: Insufficient documentation

## 2015-01-28 DIAGNOSIS — R5383 Other fatigue: Secondary | ICD-10-CM | POA: Insufficient documentation

## 2015-01-28 DIAGNOSIS — K219 Gastro-esophageal reflux disease without esophagitis: Secondary | ICD-10-CM | POA: Diagnosis not present

## 2015-01-28 DIAGNOSIS — D72821 Monocytosis (symptomatic): Secondary | ICD-10-CM

## 2015-01-28 DIAGNOSIS — Z79899 Other long term (current) drug therapy: Secondary | ICD-10-CM | POA: Diagnosis not present

## 2015-01-28 DIAGNOSIS — R531 Weakness: Secondary | ICD-10-CM | POA: Diagnosis not present

## 2015-01-28 DIAGNOSIS — D72829 Elevated white blood cell count, unspecified: Secondary | ICD-10-CM | POA: Insufficient documentation

## 2015-01-28 DIAGNOSIS — J449 Chronic obstructive pulmonary disease, unspecified: Secondary | ICD-10-CM | POA: Diagnosis not present

## 2015-01-28 DIAGNOSIS — D509 Iron deficiency anemia, unspecified: Secondary | ICD-10-CM

## 2015-01-28 DIAGNOSIS — I4891 Unspecified atrial fibrillation: Secondary | ICD-10-CM | POA: Insufficient documentation

## 2015-01-28 DIAGNOSIS — D729 Disorder of white blood cells, unspecified: Secondary | ICD-10-CM

## 2015-01-28 DIAGNOSIS — M069 Rheumatoid arthritis, unspecified: Secondary | ICD-10-CM | POA: Insufficient documentation

## 2015-01-28 LAB — SAMPLE TO BLOOD BANK

## 2015-01-28 LAB — HEMOGLOBIN: Hemoglobin: 7.9 g/dL — ABNORMAL LOW (ref 13.0–18.0)

## 2015-01-31 DIAGNOSIS — D649 Anemia, unspecified: Secondary | ICD-10-CM | POA: Diagnosis not present

## 2015-01-31 DIAGNOSIS — I4891 Unspecified atrial fibrillation: Secondary | ICD-10-CM | POA: Diagnosis not present

## 2015-01-31 DIAGNOSIS — F17210 Nicotine dependence, cigarettes, uncomplicated: Secondary | ICD-10-CM | POA: Diagnosis not present

## 2015-01-31 DIAGNOSIS — J449 Chronic obstructive pulmonary disease, unspecified: Secondary | ICD-10-CM | POA: Diagnosis not present

## 2015-01-31 DIAGNOSIS — K529 Noninfective gastroenteritis and colitis, unspecified: Secondary | ICD-10-CM | POA: Diagnosis not present

## 2015-02-04 DIAGNOSIS — I4891 Unspecified atrial fibrillation: Secondary | ICD-10-CM | POA: Diagnosis not present

## 2015-02-04 DIAGNOSIS — F17210 Nicotine dependence, cigarettes, uncomplicated: Secondary | ICD-10-CM | POA: Diagnosis not present

## 2015-02-04 DIAGNOSIS — D649 Anemia, unspecified: Secondary | ICD-10-CM | POA: Diagnosis not present

## 2015-02-04 DIAGNOSIS — J449 Chronic obstructive pulmonary disease, unspecified: Secondary | ICD-10-CM | POA: Diagnosis not present

## 2015-02-04 DIAGNOSIS — K529 Noninfective gastroenteritis and colitis, unspecified: Secondary | ICD-10-CM | POA: Diagnosis not present

## 2015-02-05 ENCOUNTER — Ambulatory Visit: Payer: Medicare Other

## 2015-02-05 ENCOUNTER — Ambulatory Visit: Payer: Medicare Other | Admitting: Internal Medicine

## 2015-02-06 DIAGNOSIS — R0602 Shortness of breath: Secondary | ICD-10-CM | POA: Diagnosis not present

## 2015-02-06 DIAGNOSIS — J449 Chronic obstructive pulmonary disease, unspecified: Secondary | ICD-10-CM | POA: Diagnosis not present

## 2015-02-06 DIAGNOSIS — R609 Edema, unspecified: Secondary | ICD-10-CM | POA: Diagnosis not present

## 2015-02-10 NOTE — Progress Notes (Signed)
Erroneous    This encounter was created in error - please disregard.

## 2015-02-13 DIAGNOSIS — F17210 Nicotine dependence, cigarettes, uncomplicated: Secondary | ICD-10-CM | POA: Diagnosis not present

## 2015-02-13 DIAGNOSIS — D649 Anemia, unspecified: Secondary | ICD-10-CM | POA: Diagnosis not present

## 2015-02-13 DIAGNOSIS — K529 Noninfective gastroenteritis and colitis, unspecified: Secondary | ICD-10-CM | POA: Diagnosis not present

## 2015-02-13 DIAGNOSIS — I4891 Unspecified atrial fibrillation: Secondary | ICD-10-CM | POA: Diagnosis not present

## 2015-02-13 DIAGNOSIS — J449 Chronic obstructive pulmonary disease, unspecified: Secondary | ICD-10-CM | POA: Diagnosis not present

## 2015-02-17 DIAGNOSIS — M0579 Rheumatoid arthritis with rheumatoid factor of multiple sites without organ or systems involvement: Secondary | ICD-10-CM | POA: Diagnosis not present

## 2015-02-17 DIAGNOSIS — D72828 Other elevated white blood cell count: Secondary | ICD-10-CM | POA: Diagnosis not present

## 2015-02-17 DIAGNOSIS — I70209 Unspecified atherosclerosis of native arteries of extremities, unspecified extremity: Secondary | ICD-10-CM | POA: Diagnosis not present

## 2015-02-17 DIAGNOSIS — Z79899 Other long term (current) drug therapy: Secondary | ICD-10-CM | POA: Diagnosis not present

## 2015-02-18 NOTE — Progress Notes (Signed)
[facility]  [phone] [phone]     ID: Thomas Townsend OB: 1931-02-17  MR#: [id number]  FOY#:[id number]  Patient Care Team: Provider Default, MD as PCP - General   CHIEF COMPLAINT/DIAGNOSIS:  Persistent Leukocytosis with Neutrophilia and Monocytosis - ? 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   Workup done on 01/31/12: Hb 13.1, WBC 14,400 with ANC of 10,500, monocytes of 1700, platelets of 226,000, retic count of 0.0448.    Ultrasound negative for hepatosplenomegaly.  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 Bone marrow biopsy 03/01/12 - no definitive features of a Mediport neoplasia including CMML, normal cytogenetics (54 XY.  Normocellular to focally mildly hypocellular marrow for age (30-50%) with trilineage hematopoiesis, no overt monocytosis (6% by morphologic differential).  Storage iron present.  Flow study unremarkable.  LAP score, LDH, JAK2 V617F mutation, and BCR-ABL study all unremarkable.   HISTORY OF PRESENT ILLNESS:  Patient returns for continued hematology followup, he was seen in June 2015. Overall doing about the same, he has chronic weakness and fatiguability. Denies any fevers or night sweats. Appetite and weight is ste ady. No bleeding symptoms. No new bone pains. He remains physically active for his age and medical problems. No nausea or vomiting. WBC is 31.8K, denies major recurrent infections.  REVIEW OF SYSTEMS:   ROS As in HPI above. In addition, no fever, chills or sweats. No new headaches or focal weakness.  No new mood disturbances. No  sore throat, cough, shortness of breath, sputum, hemoptysis or chest pain. No dizziness or palpitation. No abdominal pain, constipation, diarrhea, dysuria or hematuria. No new skin rash or bleeding symptoms. No new paresthesias in extremities.  Otherwise, a complete review of systems is negative.  PAST MEDICAL  HISTORY: Past Medical History  Diagnosis Date  . COPD (chronic obstructive pulmonary disease)   . Atrial fibrillation   . Prostate cancer   . GERD (gastroesophageal reflux disease)   . CHF (congestive heart failure)     PAST SURGICAL HISTORY: Past Surgical History  Procedure Laterality Date  . Cholecystectomy     Past Medical History/Past Surgical History -  COPD  Pneumonia 2010  Hyperlipidemia  Prostate cancer (localized disease patient) on V[employer]as implant for about 8 years now  Cholecystectomy    SOCIAL HISTORY: History  Substance Use Topics  . Smoking status: Former Smoker -- 20 years    Types: Cigarettes  . Smokeless tobacco: Not on file  . Alcohol Use: 0.0 oz/week    0 Standard drinks or equivalent per week     Comment: Wine every once in a while  20-pack-year smoking history, quit many years ago.  Denies alcohol usage.   No Known Allergies  Current Outpatient Prescriptions  Medication Sig Dispense Refill  . BREO ELLIPTA 100-25 MCG/INH AEPB Inhale 1 puff into the lungs every morning.    . digoxin (LANOXIN) 0.125 MG tablet Take 1 tablet by mouth 1 day or 1 dose.    . folic acid (FOLVITE) 1 MG tablet Take 1 tablet by mouth 1 day or 1 dose.  11  . furosemide (LASIX) 20 MG tablet Take 1 tablet by mouth 1 day or 1 dose. In the morning    . methotrexate (RHEUMATREX) 2.5 MG tablet Take 1 tablet by mouth every 7 (seven) days.  1  . montelukast (SINGULAIR) 10 MG tablet Take 1 tablet by mouth at bedtime.    . Multiple Vitamins-Minerals (PRESERVISION AREDS PO) Take 1 tablet by mouth  1 day or 1 dose.    . ondansetron (ZOFRAN) 4 MG tablet Take 1 tablet by mouth every 6 (six) hours as needed.     No current facility-administered medications for this visit.    OBJECTIVE: GENERAL: Patient is alert and oriented and in no acute distress. There is no icterus. HEENT: Oral exam negative for thrush or lesions. No cervical lymphadenopathy. CVS: S1S2, regular LUNGS: Bilaterally  clear to auscultation, no rhonchi. ABDOMEN: Soft, nontender. No hepatosplenomegaly clinically.  EXTREMITIES: No pedal edema. LYMPHATICS: No palpable adenopathy in axillary or inguinal areas.   LAB RESULTS:     Component Value Date/Time   NA 140 01/22/2015 0345   NA 139 12/27/2012 1135   K 3.7 01/22/2015 0345   K 4.3 12/27/2012 1135   CL 106 01/22/2015 0345   CL 105 12/27/2012 1135   CO2 25 01/22/2015 0345   CO2 28 12/27/2012 1135   GLUCOSE 89 01/22/2015 0345   GLUCOSE 96 12/27/2012 1135   BUN 16 01/22/2015 0345   BUN 15 12/27/2012 1135   CREATININE 0.88 01/22/2015 0345   CREATININE 0.85 12/27/2012 1135   CALCIUM 7.7* 01/22/2015 0345   CALCIUM 8.8 12/27/2012 1135   PROT 6.8 01/21/2015 1946   ALBUMIN 3.3* 01/21/2015 1946   AST 20 01/21/2015 1946   ALT 6* 01/21/2015 1946   ALKPHOS 86 01/21/2015 1946   GFRNONAA >60 01/22/2015 0345   GFRNONAA 81* 12/27/2012 1135   GFRAA >60 01/22/2015 0345   GFRAA >90 12/27/2012 1135     Lab Results  Component Value Date   WBC 31.8* 01/23/2015   NEUTROABS 23.5* 01/23/2015   HGB 7.9* 01/28/2015   HCT 24.7* 01/23/2015   MCV 98 01/23/2015   PLT 214 01/23/2015          ASSESSMENT / PLAN:   1. Persistent Leukocytosis with Neutrophilia and Monocytosis. Last bone marrow biopsy did not find any definite evidence of hematologic neoplasia including CMML (chronic myelomonocytic leukemia), that given that he continues to have slowly progressive chronic leukocytosis patient was explained that he most likely has some form of myeloproliferative disorder - reviewed labs from today and d/w patient. Given that he otherwise asymptomatic from this, no treatment is indicated at this time and plan is continued observation with monitoring of labs. Will monitor CBC/differential more closely q 4 weekly. Patient does not want to repeat bone marrow biopsy at this time. 2. Progressive Anemia -  will also get anemia workup and plan management accordingly. Will  monitor closely.  3. In between visits, the patient has been advised to call or come to the ER in case of fevers, night sweats, bleeding, acute sickness or new symptoms. Patient is agreeable to this plan.   Leia Alf, MD   02/18/2015 10:35 PM

## 2015-02-25 DIAGNOSIS — R195 Other fecal abnormalities: Secondary | ICD-10-CM | POA: Diagnosis not present

## 2015-02-25 DIAGNOSIS — D500 Iron deficiency anemia secondary to blood loss (chronic): Secondary | ICD-10-CM | POA: Diagnosis not present

## 2015-03-04 ENCOUNTER — Other Ambulatory Visit: Payer: Medicare Other

## 2015-03-10 DIAGNOSIS — J449 Chronic obstructive pulmonary disease, unspecified: Secondary | ICD-10-CM | POA: Diagnosis not present

## 2015-03-10 DIAGNOSIS — I482 Chronic atrial fibrillation: Secondary | ICD-10-CM | POA: Diagnosis not present

## 2015-03-18 DIAGNOSIS — I482 Chronic atrial fibrillation: Secondary | ICD-10-CM | POA: Diagnosis not present

## 2015-03-26 ENCOUNTER — Inpatient Hospital Stay: Payer: Medicare Other | Attending: Internal Medicine | Admitting: Internal Medicine

## 2015-03-26 ENCOUNTER — Inpatient Hospital Stay: Payer: Medicare Other

## 2015-03-26 DIAGNOSIS — D72829 Elevated white blood cell count, unspecified: Secondary | ICD-10-CM

## 2015-03-26 DIAGNOSIS — D509 Iron deficiency anemia, unspecified: Secondary | ICD-10-CM | POA: Diagnosis not present

## 2015-03-26 LAB — CBC WITH DIFFERENTIAL/PLATELET
Basophils Absolute: 0.1 10*3/uL (ref 0–0.1)
Basophils Relative: 0 %
EOS PCT: 1 %
Eosinophils Absolute: 0.1 10*3/uL (ref 0–0.7)
HCT: 32.2 % — ABNORMAL LOW (ref 40.0–52.0)
Hemoglobin: 10.2 g/dL — ABNORMAL LOW (ref 13.0–18.0)
LYMPHS ABS: 1.2 10*3/uL (ref 1.0–3.6)
LYMPHS PCT: 6 %
MCH: 30.3 pg (ref 26.0–34.0)
MCHC: 31.6 g/dL — ABNORMAL LOW (ref 32.0–36.0)
MCV: 96.1 fL (ref 80.0–100.0)
MONO ABS: 1.4 10*3/uL — AB (ref 0.2–1.0)
Monocytes Relative: 7 %
Neutro Abs: 18 10*3/uL — ABNORMAL HIGH (ref 1.4–6.5)
Neutrophils Relative %: 86 %
PLATELETS: 219 10*3/uL (ref 150–440)
RBC: 3.35 MIL/uL — AB (ref 4.40–5.90)
RDW: 18.1 % — AB (ref 11.5–14.5)
WBC: 20.8 10*3/uL — AB (ref 3.8–10.6)

## 2015-03-26 LAB — IRON AND TIBC
IRON: 61 ug/dL (ref 45–182)
Saturation Ratios: 24 % (ref 17.9–39.5)
TIBC: 260 ug/dL (ref 250–450)
UIBC: 199 ug/dL

## 2015-03-26 LAB — FERRITIN: Ferritin: 174 ng/mL (ref 24–336)

## 2015-03-28 LAB — MISC LABCORP TEST (SEND OUT): Labcorp test code: 480260

## 2015-04-01 ENCOUNTER — Inpatient Hospital Stay: Payer: Medicare Other | Attending: Internal Medicine

## 2015-04-04 ENCOUNTER — Ambulatory Visit: Payer: Medicare Other | Admitting: Anesthesiology

## 2015-04-04 ENCOUNTER — Ambulatory Visit
Admission: RE | Admit: 2015-04-04 | Discharge: 2015-04-04 | Disposition: A | Discharge status: Home / Self Care | Payer: Medicare Other | Source: Ambulatory Visit | Attending: Unknown Physician Specialty | Admitting: Unknown Physician Specialty

## 2015-04-04 ENCOUNTER — Encounter
Admission: RE | Disposition: A | Discharge status: Home / Self Care | Payer: Self-pay | Source: Ambulatory Visit | Attending: Unknown Physician Specialty

## 2015-04-04 DIAGNOSIS — D509 Iron deficiency anemia, unspecified: Secondary | ICD-10-CM | POA: Diagnosis not present

## 2015-04-04 DIAGNOSIS — I509 Heart failure, unspecified: Secondary | ICD-10-CM | POA: Diagnosis not present

## 2015-04-04 DIAGNOSIS — C61 Malignant neoplasm of prostate: Secondary | ICD-10-CM | POA: Insufficient documentation

## 2015-04-04 DIAGNOSIS — M069 Rheumatoid arthritis, unspecified: Secondary | ICD-10-CM | POA: Diagnosis not present

## 2015-04-04 DIAGNOSIS — K3189 Other diseases of stomach and duodenum: Secondary | ICD-10-CM | POA: Diagnosis not present

## 2015-04-04 DIAGNOSIS — E785 Hyperlipidemia, unspecified: Secondary | ICD-10-CM | POA: Diagnosis not present

## 2015-04-04 DIAGNOSIS — R195 Other fecal abnormalities: Secondary | ICD-10-CM | POA: Diagnosis not present

## 2015-04-04 DIAGNOSIS — K209 Esophagitis, unspecified: Secondary | ICD-10-CM | POA: Diagnosis not present

## 2015-04-04 DIAGNOSIS — Z9049 Acquired absence of other specified parts of digestive tract: Secondary | ICD-10-CM | POA: Diagnosis not present

## 2015-04-04 DIAGNOSIS — K5730 Diverticulosis of large intestine without perforation or abscess without bleeding: Secondary | ICD-10-CM | POA: Diagnosis not present

## 2015-04-04 DIAGNOSIS — J449 Chronic obstructive pulmonary disease, unspecified: Secondary | ICD-10-CM | POA: Insufficient documentation

## 2015-04-04 DIAGNOSIS — I4891 Unspecified atrial fibrillation: Secondary | ICD-10-CM | POA: Diagnosis not present

## 2015-04-04 DIAGNOSIS — Z79899 Other long term (current) drug therapy: Secondary | ICD-10-CM | POA: Insufficient documentation

## 2015-04-04 DIAGNOSIS — I10 Essential (primary) hypertension: Secondary | ICD-10-CM | POA: Insufficient documentation

## 2015-04-04 DIAGNOSIS — K210 Gastro-esophageal reflux disease with esophagitis: Secondary | ICD-10-CM | POA: Diagnosis not present

## 2015-04-04 DIAGNOSIS — K921 Melena: Secondary | ICD-10-CM | POA: Diagnosis not present

## 2015-04-04 DIAGNOSIS — K640 First degree hemorrhoids: Secondary | ICD-10-CM | POA: Diagnosis not present

## 2015-04-04 DIAGNOSIS — Z87891 Personal history of nicotine dependence: Secondary | ICD-10-CM | POA: Diagnosis not present

## 2015-04-04 DIAGNOSIS — Q282 Arteriovenous malformation of cerebral vessels: Secondary | ICD-10-CM | POA: Diagnosis not present

## 2015-04-04 DIAGNOSIS — K31819 Angiodysplasia of stomach and duodenum without bleeding: Secondary | ICD-10-CM | POA: Diagnosis not present

## 2015-04-04 HISTORY — PX: ESOPHAGOGASTRODUODENOSCOPY: SHX5428

## 2015-04-04 HISTORY — PX: COLONOSCOPY WITH PROPOFOL: SHX5780

## 2015-04-04 HISTORY — DX: Essential (primary) hypertension: I10

## 2015-04-04 HISTORY — DX: Unspecified osteoarthritis, unspecified site: M19.90

## 2015-04-04 HISTORY — DX: Cardiac arrhythmia, unspecified: I49.9

## 2015-04-04 HISTORY — DX: Hyperlipidemia, unspecified: E78.5

## 2015-04-04 SURGERY — COLONOSCOPY WITH PROPOFOL
Anesthesia: General

## 2015-04-04 MED ORDER — LIDOCAINE HCL (PF) 2 % IJ SOLN
INTRAMUSCULAR | Status: DC | PRN
  Administered 2015-04-04: 50 mg

## 2015-04-04 MED ORDER — SODIUM CHLORIDE 0.9 % IV SOLN
INTRAVENOUS | Status: DC
  Administered 2015-04-04: 09:00:00 via INTRAVENOUS

## 2015-04-04 MED ORDER — FENTANYL CITRATE (PF) 100 MCG/2ML IJ SOLN
INTRAMUSCULAR | Status: DC | PRN
  Administered 2015-04-04: 50 ug via INTRAVENOUS

## 2015-04-04 MED ORDER — SODIUM CHLORIDE 0.9 % IV SOLN: INTRAVENOUS | Status: DC

## 2015-04-04 MED ORDER — PROPOFOL 10 MG/ML IV BOLUS
INTRAVENOUS | Status: DC | PRN
  Administered 2015-04-04: 30 mg via INTRAVENOUS

## 2015-04-04 MED ORDER — PROPOFOL INFUSION 10 MG/ML OPTIME
INTRAVENOUS | Status: DC | PRN
  Administered 2015-04-04: 100 ug/kg/min via INTRAVENOUS

## 2015-04-04 MED ORDER — PHENYLEPHRINE HCL 10 MG/ML IJ SOLN
INTRAMUSCULAR | Status: DC | PRN
Start: 2015-04-04 — End: 2015-04-04
  Administered 2015-04-04 (×4): 100 ug via INTRAVENOUS
  Administered 2015-04-04 (×4): 200 ug via INTRAVENOUS

## 2015-04-04 NOTE — H&P (Signed)
Primary Care Physician:  Allyne Gee, MD Primary Gastroenterologist:  Dr. Vira Agar  Pre-Procedure History & Physical: HPI:  Thomas Townsend is a 81 y.o. male is here for an endoscopy and colonoscopy.   Past Medical History  Diagnosis Date  . COPD (chronic obstructive pulmonary disease)   . Atrial fibrillation   . Prostate cancer   . GERD (gastroesophageal reflux disease)   . CHF (congestive heart failure)   . Dysrhythmia   . Arthritis   . Hypertension   . Hyperlipemia     Past Surgical History  Procedure Laterality Date  . Cholecystectomy      Prior to Admission medications   Medication Sig Start Date End Date Taking? Authorizing Provider  apixaban (ELIQUIS) 2.5 MG TABS tablet Take 2.5 mg by mouth 2 (two) times daily.   Yes Historical Provider, MD  BREO ELLIPTA 100-25 MCG/INH AEPB Inhale 1 puff into the lungs every morning. 01/28/15  Yes Historical Provider, MD  folic acid (FOLVITE) 1 MG tablet Take 1 tablet by mouth 1 day or 1 dose. 01/13/15  Yes Historical Provider, MD  furosemide (LASIX) 20 MG tablet Take 1 tablet by mouth 1 day or 1 dose. In the morning 01/20/15  Yes Historical Provider, MD  meloxicam (MOBIC) 7.5 MG tablet Take by mouth.   Yes Historical Provider, MD  methotrexate (RHEUMATREX) 2.5 MG tablet Take 1 tablet by mouth every 7 (seven) days. 01/11/15  Yes Historical Provider, MD  montelukast (SINGULAIR) 10 MG tablet Take 1 tablet by mouth at bedtime. 01/28/15  Yes Historical Provider, MD  Multiple Vitamins-Minerals (PRESERVISION AREDS PO) Take 1 tablet by mouth 1 day or 1 dose.   Yes Historical Provider, MD  ondansetron (ZOFRAN) 4 MG tablet Take 1 tablet by mouth every 6 (six) hours as needed. 01/23/15  Yes Historical Provider, MD  polyethylene glycol powder (GLYCOLAX/MIRALAX) powder TAKE 255 G BY MOUTH ONCE. USE AS DIRECTED FOR COLON PREP. 02/25/15  Yes Historical Provider, MD  predniSONE (DELTASONE) 5 MG tablet TAKE 1 TABLET (5 MG TOTAL) BY MOUTH ONCE DAILY. 03/03/15   Yes Historical Provider, MD  digoxin (LANOXIN) 0.125 MG tablet Take 1 tablet by mouth 1 day or 1 dose. 01/28/15   Historical Provider, MD  ferrous sulfate 325 (65 FE) MG tablet Take by mouth.    Historical Provider, MD    Allergies as of 02/26/2015  . (No Known Allergies)    No family history on file.  History   Social History  . Marital Status: Married    Spouse Name: N/A  . Number of Children: N/A  . Years of Education: N/A   Occupational History  . Not on file.   Social History Main Topics  . Smoking status: Former Smoker -- 20 years    Types: Cigarettes  . Smokeless tobacco: Not on file  . Alcohol Use: 0.0 oz/week    0 Standard drinks or equivalent per week     Comment: Wine every once in a while  . Drug Use: No  . Sexual Activity: Not on file   Other Topics Concern  . Not on file   Social History Narrative    Review of Systems: See HPI, otherwise negative ROS  Physical Exam: Pulse 107  Temp(Src) 97.6 F (36.4 C) (Oral)  Resp 20  Ht 5\' 7"  (1.702 m) General:   Alert,  pleasant and cooperative in NAD Head:  Normocephalic and atraumatic. Neck:  Supple; no masses or thyromegaly. Lungs:  Clear throughout to auscultation.  Heart:  Regular rate and rhythm. Abdomen:  Soft, nontender and nondistended. Normal bowel sounds, without guarding, and without rebound.   Neurologic:  Alert and  oriented x4;  grossly normal neurologically.  Impression/Plan: Thomas Townsend is here for an endoscopy and colonoscopy to be performed for iron def anemia and heme positive stool.  Risks, benefits, limitations, and alternatives regarding  endoscopy and colonoscopy have been reviewed with the patient.  Questions have been answered.  All parties agreeable.   Gaylyn Cheers, MD  04/04/2015, 9:22 AM   Primary Care Physician:  Allyne Gee, MD Primary Gastroenterologist:  Dr. Vira Agar  Pre-Procedure History & Physical: HPI:  Thomas Townsend is a 81 y.o. male is here for an  endoscopy and colonoscopy.   Past Medical History  Diagnosis Date  . COPD (chronic obstructive pulmonary disease)   . Atrial fibrillation   . Prostate cancer   . GERD (gastroesophageal reflux disease)   . CHF (congestive heart failure)   . Dysrhythmia   . Arthritis   . Hypertension   . Hyperlipemia     Past Surgical History  Procedure Laterality Date  . Cholecystectomy      Prior to Admission medications   Medication Sig Start Date End Date Taking? Authorizing Provider  apixaban (ELIQUIS) 2.5 MG TABS tablet Take 2.5 mg by mouth 2 (two) times daily.   Yes Historical Provider, MD  BREO ELLIPTA 100-25 MCG/INH AEPB Inhale 1 puff into the lungs every morning. 01/28/15  Yes Historical Provider, MD  folic acid (FOLVITE) 1 MG tablet Take 1 tablet by mouth 1 day or 1 dose. 01/13/15  Yes Historical Provider, MD  furosemide (LASIX) 20 MG tablet Take 1 tablet by mouth 1 day or 1 dose. In the morning 01/20/15  Yes Historical Provider, MD  meloxicam (MOBIC) 7.5 MG tablet Take by mouth.   Yes Historical Provider, MD  methotrexate (RHEUMATREX) 2.5 MG tablet Take 1 tablet by mouth every 7 (seven) days. 01/11/15  Yes Historical Provider, MD  montelukast (SINGULAIR) 10 MG tablet Take 1 tablet by mouth at bedtime. 01/28/15  Yes Historical Provider, MD  Multiple Vitamins-Minerals (PRESERVISION AREDS PO) Take 1 tablet by mouth 1 day or 1 dose.   Yes Historical Provider, MD  ondansetron (ZOFRAN) 4 MG tablet Take 1 tablet by mouth every 6 (six) hours as needed. 01/23/15  Yes Historical Provider, MD  polyethylene glycol powder (GLYCOLAX/MIRALAX) powder TAKE 255 G BY MOUTH ONCE. USE AS DIRECTED FOR COLON PREP. 02/25/15  Yes Historical Provider, MD  predniSONE (DELTASONE) 5 MG tablet TAKE 1 TABLET (5 MG TOTAL) BY MOUTH ONCE DAILY. 03/03/15  Yes Historical Provider, MD  digoxin (LANOXIN) 0.125 MG tablet Take 1 tablet by mouth 1 day or 1 dose. 01/28/15   Historical Provider, MD  ferrous sulfate 325 (65 FE) MG tablet Take by  mouth.    Historical Provider, MD    Allergies as of 02/26/2015  . (No Known Allergies)    No family history on file.  History   Social History  . Marital Status: Married    Spouse Name: N/A  . Number of Children: N/A  . Years of Education: N/A   Occupational History  . Not on file.   Social History Main Topics  . Smoking status: Former Smoker -- 20 years    Types: Cigarettes  . Smokeless tobacco: Not on file  . Alcohol Use: 0.0 oz/week    0 Standard drinks or equivalent per week     Comment: Wine  every once in a while  . Drug Use: No  . Sexual Activity: Not on file   Other Topics Concern  . Not on file   Social History Narrative    Review of Systems: See HPI, otherwise negative ROS  Physical Exam: Pulse 107  Temp(Src) 97.6 F (36.4 C) (Oral)  Resp 20  Ht 5\' 7"  (1.702 m) General:   Alert,  pleasant and cooperative in NAD Head:  Normocephalic and atraumatic. Neck:  Supple; no masses or thyromegaly. Lungs:  Clear throughout to auscultation.   Decreased breath sounds both sides. Heart:  Irregular irregular rhythm c/w atrial fib. Abdomen:  Soft, nontender and nondistended. Normal bowel sounds, without guarding, and without rebound.   Neurologic:  Alert and  oriented x4;  grossly normal neurologically.  Impression/Plan: Melecio TAEGEN DELKER is here for an endoscopy and colonoscopy to be performed for iron def anemia and heme positive stool.  Risks, benefits, limitations, and alternatives regarding  endoscopy and colonoscopy have been reviewed with the patient.  Questions have been answered.  All parties agreeable.   Gaylyn Cheers, MD  04/04/2015, 9:22 AM

## 2015-04-04 NOTE — Transfer of Care (Signed)
Immediate Anesthesia Transfer of Care Note  Patient: Thomas Townsend  Procedure(s) Performed: Procedure(s): COLONOSCOPY WITH PROPOFOL (N/A) ESOPHAGOGASTRODUODENOSCOPY (EGD) (N/A)  Patient Location: PACU  Anesthesia Type:General  Level of Consciousness: sedated  Airway & Oxygen Therapy: Patient Spontanous Breathing and Patient connected to nasal cannula oxygen  Post-op Assessment: Report given to RN and Post -op Vital signs reviewed and stable  Post vital signs: Reviewed and stable  Last Vitals:  Filed Vitals:   04/04/15 0909  Pulse: 107  Temp: 36.4 C  Resp: 20    Complications: No apparent anesthesia complications

## 2015-04-04 NOTE — Anesthesia Preprocedure Evaluation (Addendum)
Anesthesia Evaluation  Patient identified by MRN, date of birth, ID band Patient awake    Reviewed: Allergy & Precautions, NPO status , Patient's Chart, lab work & pertinent test results  Airway Mallampati: III  TM Distance: >3 FB Neck ROM: Limited    Dental  (+) Upper Dentures   Pulmonary COPD COPD inhaler, former smoker,    + decreased breath sounds      Cardiovascular Exercise Tolerance: Poor +CHF + dysrhythmias Atrial Fibrillation Rhythm:Irregular  afib. on elequis which has been held >3 days. CHF, lasix, stable.   Neuro/Psych    GI/Hepatic   Endo/Other    Renal/GU      Musculoskeletal  (+) Arthritis -, Rheumatoid disorders,    Abdominal (+)  Abdomen: soft.    Peds  Hematology   Anesthesia Other Findings   Reproductive/Obstetrics                            Anesthesia Physical Anesthesia Plan  ASA: III  Anesthesia Plan: General   Post-op Pain Management:    Induction: Intravenous  Airway Management Planned: Nasal Cannula  Additional Equipment:   Intra-op Plan:   Post-operative Plan:   Informed Consent: I have reviewed the patients History and Physical, chart, labs and discussed the procedure including the risks, benefits and alternatives for the proposed anesthesia with the patient or authorized representative who has indicated his/her understanding and acceptance.     Plan Discussed with:   Anesthesia Plan Comments:         Anesthesia Quick Evaluation

## 2015-04-04 NOTE — Anesthesia Postprocedure Evaluation (Signed)
  Anesthesia Post-op Note  Patient: Thomas Townsend  Procedure(s) Performed: Procedure(s): COLONOSCOPY WITH PROPOFOL (N/A) ESOPHAGOGASTRODUODENOSCOPY (EGD) (N/A)  Anesthesia type:General  Patient location: PACU  Post pain: Pain level controlled  Post assessment: Post-op Vital signs reviewed, Patient's Cardiovascular Status Stable, Respiratory Function Stable, Patent Airway and No signs of Nausea or vomiting  Post vital signs: Reviewed and stable  Last Vitals:  Filed Vitals:   04/04/15 0909  Pulse: 107  Temp: 36.4 C  Resp: 20    Level of consciousness: awake, alert  and patient cooperative  Complications: No apparent anesthesia complications

## 2015-04-04 NOTE — Op Note (Signed)
[facility] Gastroenterology Patient Name: Thomas Townsend Procedure Date: 04/04/2015 10:02 AM MRN: [id number] Account #: [id number] Date of Birth: 31-Jan-1931 Admit Type: Outpatient Age: 81 Room: [facility] ENDO ROOM 1 Gender: Male Note Status: Finalized Procedure:         Colonoscopy Providers:         Manya Silvas, MD Complications:     No immediate complications. Procedure:         Pre-Anesthesia Assessment:                    - After reviewing the risks and benefits, the patient was                     deemed in satisfactory condition to undergo the procedure.                    After obtaining informed consent, the colonoscope was                     passed under direct vision. Throughout the procedure, the                     patient's blood pressure, pulse, and oxygen saturations                     were monitored continuously. The Olympus PCF-160AL                     colonoscope (S#. [id number]) was introduced through the anus                     with the intention of advancing to the cecum. The scope                     was advanced to the sigmoid colon before the procedure was                     aborted. Medications were given. The colonoscopy was                     extremely difficult due to multiple diverticula in the                     colon, poor endoscopic visualization, restricted mobility                     of the colon and a tortuous colon. The patient tolerated                     the procedure well. The quality of the bowel preparation                     was unsatisfactory. Findings:      Multiple small and large-mouthed diverticula were found in the sigmoid       colon.      A large amount of stool was found in the sigmoid colon, precluding       visualization.      Internal hemorrhoids were found during endoscopy. The hemorrhoids were       medium-sized and Grade I (internal hemorrhoids that do not prolapse). Impression:        - Preparation  of the colon was unsatisfactory.                    -  Diverticulosis in the sigmoid colon.                    - Stool in the sigmoid colon.                    - Internal hemorrhoids.                    - No specimens collected. Recommendation:    - The findings and recommendations were discussed with the                     patient's family. Consider virtual colonoscopy. Manya Silvas, MD 04/04/2015 10:22:44 AM This report has been signed electronically. Number of Addenda: 0 Note Initiated On: 04/04/2015 10:02 AM Total Procedure Duration: 0 hours 13 minutes 15 seconds       [facility]

## 2015-04-07 ENCOUNTER — Other Ambulatory Visit: Payer: Self-pay | Admitting: Unknown Physician Specialty

## 2015-04-07 ENCOUNTER — Encounter: Payer: Self-pay | Admitting: Unknown Physician Specialty

## 2015-04-07 DIAGNOSIS — Q438 Other specified congenital malformations of intestine: Secondary | ICD-10-CM

## 2015-04-07 DIAGNOSIS — K5792 Diverticulitis of intestine, part unspecified, without perforation or abscess without bleeding: Secondary | ICD-10-CM

## 2015-04-09 NOTE — Op Note (Signed)
[facility] Gastroenterology Patient Name: Thomas Townsend Procedure Date: 04/04/2015 9:09 AM MRN: [id number] Account #: [id number] Date of Birth: [birth year] Admit Type: Outpatient Age: 81 Room: [facility] ENDO ROOM 1 Gender: Male Note Status: Finalized Procedure:         Upper GI endoscopy Indications:       Iron deficiency anemia, Heme positive stool Providers:         Manya Silvas, MD Referring MD:      Allyne Gee, MD (Referring MD) Medicines:         Propofol per Anesthesia Complications:     No immediate complications. Procedure:         Pre-Anesthesia Assessment:                    - After reviewing the risks and benefits, the patient was                     deemed in satisfactory condition to undergo the procedure.                    After obtaining informed consent, the endoscope was passed                     under direct vision. Throughout the procedure, the                     patient's blood pressure, pulse, and oxygen saturations                     were monitored continuously. The Olympus GIF-160 endoscope                     (S#. [id number]) was introduced through the mouth, and                     advanced to the second part of duodenum. The upper GI                     endoscopy was accomplished without difficulty. The patient                     tolerated the procedure well. Findings:      LA Grade A (one or more mucosal breaks less than 5 mm, not extending       between tops of 2 mucosal folds) esophagitis with no bleeding was found.      Hematin (altered blood/coffee-ground-like material) was found in the       gastric body. 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 Coagulation for tissue       destruction using argon plasma at 2 liters/minute and 20 watts was       successful o destroy these spots to lessen bleeding. An esophageal ring       was seen at GEJ.      A mass was seen  in proximal second portion of the duodenum and biopsied       twice, adenoma? doubt ampulla. Impression:        - LA Grade A reflux esophagitis.                    - Hematin (altered blood/coffee-ground-like material) in  the gastric body. Treated with argon plasma coagulation                     (APC).                    - No specimens collected. Recommendation:    - Await pathology results.                    - Perform a colonoscopy as previously scheduled. Manya Silvas, MD 04/09/2015 10:28:51 AM This report has been signed electronically. Number of Addenda: 0 Note Initiated On: 04/04/2015 9:09 AM      [facility]

## 2015-04-14 DIAGNOSIS — I482 Chronic atrial fibrillation: Secondary | ICD-10-CM | POA: Diagnosis not present

## 2015-04-14 DIAGNOSIS — J449 Chronic obstructive pulmonary disease, unspecified: Secondary | ICD-10-CM | POA: Diagnosis not present

## 2015-04-14 DIAGNOSIS — C9290 Myeloid leukemia, unspecified, not having achieved remission: Secondary | ICD-10-CM | POA: Diagnosis not present

## 2015-04-14 DIAGNOSIS — Z0001 Encounter for general adult medical examination with abnormal findings: Secondary | ICD-10-CM | POA: Diagnosis not present

## 2015-04-14 DIAGNOSIS — M064 Inflammatory polyarthropathy: Secondary | ICD-10-CM | POA: Diagnosis not present

## 2015-04-17 DIAGNOSIS — D72828 Other elevated white blood cell count: Secondary | ICD-10-CM | POA: Diagnosis not present

## 2015-04-17 DIAGNOSIS — Z79899 Other long term (current) drug therapy: Secondary | ICD-10-CM | POA: Diagnosis not present

## 2015-04-17 DIAGNOSIS — M0579 Rheumatoid arthritis with rheumatoid factor of multiple sites without organ or systems involvement: Secondary | ICD-10-CM | POA: Diagnosis not present

## 2015-04-29 ENCOUNTER — Inpatient Hospital Stay: Payer: Medicare Other | Attending: Internal Medicine

## 2015-04-29 DIAGNOSIS — M129 Arthropathy, unspecified: Secondary | ICD-10-CM | POA: Diagnosis not present

## 2015-04-29 DIAGNOSIS — J449 Chronic obstructive pulmonary disease, unspecified: Secondary | ICD-10-CM | POA: Insufficient documentation

## 2015-04-29 DIAGNOSIS — D509 Iron deficiency anemia, unspecified: Secondary | ICD-10-CM

## 2015-04-29 DIAGNOSIS — Z7952 Long term (current) use of systemic steroids: Secondary | ICD-10-CM | POA: Diagnosis not present

## 2015-04-29 DIAGNOSIS — Z79899 Other long term (current) drug therapy: Secondary | ICD-10-CM | POA: Insufficient documentation

## 2015-04-29 DIAGNOSIS — Z8546 Personal history of malignant neoplasm of prostate: Secondary | ICD-10-CM | POA: Diagnosis not present

## 2015-04-29 DIAGNOSIS — Z87891 Personal history of nicotine dependence: Secondary | ICD-10-CM | POA: Insufficient documentation

## 2015-04-29 DIAGNOSIS — D72821 Monocytosis (symptomatic): Secondary | ICD-10-CM | POA: Insufficient documentation

## 2015-04-29 DIAGNOSIS — E785 Hyperlipidemia, unspecified: Secondary | ICD-10-CM | POA: Diagnosis not present

## 2015-04-29 DIAGNOSIS — I10 Essential (primary) hypertension: Secondary | ICD-10-CM | POA: Insufficient documentation

## 2015-04-29 DIAGNOSIS — K219 Gastro-esophageal reflux disease without esophagitis: Secondary | ICD-10-CM | POA: Diagnosis not present

## 2015-04-29 DIAGNOSIS — I4891 Unspecified atrial fibrillation: Secondary | ICD-10-CM | POA: Diagnosis not present

## 2015-04-29 DIAGNOSIS — Z8701 Personal history of pneumonia (recurrent): Secondary | ICD-10-CM | POA: Diagnosis not present

## 2015-04-29 DIAGNOSIS — I509 Heart failure, unspecified: Secondary | ICD-10-CM | POA: Insufficient documentation

## 2015-04-29 DIAGNOSIS — D72829 Elevated white blood cell count, unspecified: Secondary | ICD-10-CM | POA: Insufficient documentation

## 2015-04-29 LAB — CBC WITH DIFFERENTIAL/PLATELET
BASOS ABS: 0.1 10*3/uL (ref 0–0.1)
Basophils Relative: 1 %
Eosinophils Absolute: 0.2 10*3/uL (ref 0–0.7)
Eosinophils Relative: 1 %
HEMATOCRIT: 32.1 % — AB (ref 40.0–52.0)
HEMOGLOBIN: 10.5 g/dL — AB (ref 13.0–18.0)
LYMPHS ABS: 1.7 10*3/uL (ref 1.0–3.6)
LYMPHS PCT: 7 %
MCH: 30.4 pg (ref 26.0–34.0)
MCHC: 32.6 g/dL (ref 32.0–36.0)
MCV: 93.2 fL (ref 80.0–100.0)
MONO ABS: 1.6 10*3/uL — AB (ref 0.2–1.0)
MONOS PCT: 7 %
Neutro Abs: 20.1 10*3/uL — ABNORMAL HIGH (ref 1.4–6.5)
Neutrophils Relative %: 84 %
Platelets: 269 10*3/uL (ref 150–440)
RBC: 3.44 MIL/uL — ABNORMAL LOW (ref 4.40–5.90)
RDW: 17.3 % — AB (ref 11.5–14.5)
WBC: 23.8 10*3/uL — ABNORMAL HIGH (ref 3.8–10.6)

## 2015-04-29 LAB — IRON AND TIBC
IRON: 106 ug/dL (ref 45–182)
Saturation Ratios: 41 % — ABNORMAL HIGH (ref 17.9–39.5)
TIBC: 259 ug/dL (ref 250–450)
UIBC: 153 ug/dL

## 2015-04-29 LAB — FERRITIN: FERRITIN: 147 ng/mL (ref 24–336)

## 2015-04-30 LAB — SURGICAL PATHOLOGY

## 2015-05-06 DIAGNOSIS — C61 Malignant neoplasm of prostate: Secondary | ICD-10-CM | POA: Diagnosis not present

## 2015-05-12 DIAGNOSIS — C61 Malignant neoplasm of prostate: Secondary | ICD-10-CM | POA: Diagnosis not present

## 2015-05-20 ENCOUNTER — Ambulatory Visit (INDEPENDENT_AMBULATORY_CARE_PROVIDER_SITE_OTHER): Payer: Medicare Other | Admitting: Ophthalmology

## 2015-05-20 DIAGNOSIS — H43812 Vitreous degeneration, left eye: Secondary | ICD-10-CM | POA: Diagnosis not present

## 2015-05-20 DIAGNOSIS — H3531 Nonexudative age-related macular degeneration: Secondary | ICD-10-CM

## 2015-05-20 DIAGNOSIS — H35341 Macular cyst, hole, or pseudohole, right eye: Secondary | ICD-10-CM | POA: Diagnosis not present

## 2015-05-27 ENCOUNTER — Inpatient Hospital Stay (HOSPITAL_BASED_OUTPATIENT_CLINIC_OR_DEPARTMENT_OTHER): Payer: Medicare Other | Admitting: Internal Medicine

## 2015-05-27 ENCOUNTER — Inpatient Hospital Stay: Payer: Medicare Other

## 2015-05-27 VITALS — Ht 67.0 in | Wt 152.3 lb

## 2015-05-27 DIAGNOSIS — I509 Heart failure, unspecified: Secondary | ICD-10-CM | POA: Diagnosis not present

## 2015-05-27 DIAGNOSIS — J449 Chronic obstructive pulmonary disease, unspecified: Secondary | ICD-10-CM | POA: Diagnosis not present

## 2015-05-27 DIAGNOSIS — M129 Arthropathy, unspecified: Secondary | ICD-10-CM

## 2015-05-27 DIAGNOSIS — D509 Iron deficiency anemia, unspecified: Secondary | ICD-10-CM | POA: Diagnosis not present

## 2015-05-27 DIAGNOSIS — E785 Hyperlipidemia, unspecified: Secondary | ICD-10-CM | POA: Diagnosis not present

## 2015-05-27 DIAGNOSIS — I10 Essential (primary) hypertension: Secondary | ICD-10-CM

## 2015-05-27 DIAGNOSIS — D72829 Elevated white blood cell count, unspecified: Secondary | ICD-10-CM

## 2015-05-27 DIAGNOSIS — M79631 Pain in right forearm: Secondary | ICD-10-CM

## 2015-05-27 DIAGNOSIS — Z7952 Long term (current) use of systemic steroids: Secondary | ICD-10-CM | POA: Diagnosis not present

## 2015-05-27 DIAGNOSIS — K219 Gastro-esophageal reflux disease without esophagitis: Secondary | ICD-10-CM | POA: Diagnosis not present

## 2015-05-27 DIAGNOSIS — Z87891 Personal history of nicotine dependence: Secondary | ICD-10-CM | POA: Diagnosis not present

## 2015-05-27 DIAGNOSIS — D72821 Monocytosis (symptomatic): Secondary | ICD-10-CM | POA: Diagnosis not present

## 2015-05-27 DIAGNOSIS — I4891 Unspecified atrial fibrillation: Secondary | ICD-10-CM | POA: Diagnosis not present

## 2015-05-27 DIAGNOSIS — Z8701 Personal history of pneumonia (recurrent): Secondary | ICD-10-CM

## 2015-05-27 DIAGNOSIS — Z8546 Personal history of malignant neoplasm of prostate: Secondary | ICD-10-CM

## 2015-05-27 DIAGNOSIS — Z79899 Other long term (current) drug therapy: Secondary | ICD-10-CM

## 2015-05-27 LAB — CBC WITH DIFFERENTIAL/PLATELET
Basophils Absolute: 0.1 10*3/uL (ref 0–0.1)
Basophils Relative: 0 %
EOS ABS: 0.2 10*3/uL (ref 0–0.7)
EOS PCT: 1 %
HCT: 28.9 % — ABNORMAL LOW (ref 40.0–52.0)
HEMOGLOBIN: 9.5 g/dL — AB (ref 13.0–18.0)
LYMPHS ABS: 1 10*3/uL (ref 1.0–3.6)
Lymphocytes Relative: 4 %
MCH: 31.2 pg (ref 26.0–34.0)
MCHC: 33.1 g/dL (ref 32.0–36.0)
MCV: 94.3 fL (ref 80.0–100.0)
MONOS PCT: 5 %
Monocytes Absolute: 1.3 10*3/uL — ABNORMAL HIGH (ref 0.2–1.0)
NEUTROS PCT: 90 %
Neutro Abs: 23.9 10*3/uL — ABNORMAL HIGH (ref 1.4–6.5)
Platelets: 222 10*3/uL (ref 150–440)
RBC: 3.06 MIL/uL — ABNORMAL LOW (ref 4.40–5.90)
RDW: 18.1 % — ABNORMAL HIGH (ref 11.5–14.5)
WBC: 26.4 10*3/uL — ABNORMAL HIGH (ref 3.8–10.6)

## 2015-05-27 NOTE — Progress Notes (Signed)
Patient is here for follow-up. He states that he has been feeling good and offers no complaints.

## 2015-06-09 DIAGNOSIS — I482 Chronic atrial fibrillation: Secondary | ICD-10-CM | POA: Diagnosis not present

## 2015-06-11 DIAGNOSIS — M0579 Rheumatoid arthritis with rheumatoid factor of multiple sites without organ or systems involvement: Secondary | ICD-10-CM | POA: Diagnosis not present

## 2015-06-11 NOTE — Progress Notes (Signed)
Thomas Townsend  [phone] [phone]     ID: Lora Paula OB: 1930/12/22  MR#: [id number]  TOI#:[id number]  Patient Care Team: Allyne Gee, MD as PCP - General (Internal Medicine)   CHIEF COMPLAINT/DIAGNOSIS:  Persistent Leukocytosis with Neutrophilia and Monocytosis - ? 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   Workup done on 01/31/12: Hb 13.1, WBC 14,400 with ANC of 10,500, monocytes of 1700, platelets of 226,000, retic count of 0.0448.    Ultrasound negative for hepatosplenomegaly.  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 Bone marrow biopsy 03/01/12 - no definitive features of a Mediport neoplasia including CMML, normal cytogenetics (30 XY.  Normocellular to focally mildly hypocellular marrow for age (30-50%) with trilineage hematopoiesis, no overt monocytosis (6% by morphologic differential).  Storage iron present.  Flow study unremarkable.  LAP score, LDH, JAK2 V617F mutation, and BCR-ABL study all unremarkable.   HISTORY OF PRESENT ILLNESS:  Patient returns for continued hematology followup, he was seen in June. States he is doing about the same, he has chronic weakness and fatiguability. Denies any fevers or night sweats. Appetite is steady, denies weight loss. No bleeding symptoms. No new bone pains. He remains physically active for his age and medical problems. No nausea or vomiting. WBC is 31.8K, denies major recurrent infections. States he also is taking low dose prednisone.Marland Kitchen  REVIEW OF SYSTEMS:   ROS As in HPI above. In addition, no feverls or sweats. No new headaches or focal weakness.  No new sore throat, cough, shortness of breath, sputum, hemoptysis or chest pain. No dizziness or palpitation. No abdominal pain, constipation, diarrhea, dysuria or hematuria. No new skin rash or bleeding symptoms. No new paresthesias in extremities.   PAST MEDICAL HISTORY: Past  Medical History  Diagnosis Date  . COPD (chronic obstructive pulmonary disease)   . Atrial fibrillation   . Prostate cancer   . GERD (gastroesophageal reflux disease)   . CHF (congestive heart failure)   . Dysrhythmia   . Arthritis   . Hypertension   . Hyperlipemia           COPD  Pneumonia 2010  Hyperlipidemia  Prostate cancer (localized disease patient) on V[employer]as implant for about 8 years now  Cholecystectomy  PAST SURGICAL HISTORY: Past Surgical History  Procedure Laterality Date  . Cholecystectomy    . Colonoscopy with propofol N/A 04/04/2015    Procedure: COLONOSCOPY WITH PROPOFOL;  Surgeon: Manya Silvas, MD;  [facility] END[facility]COPY;  Service: Endoscopy;  Laterality: N/A;  . Esophagogastroduodenoscopy N/A 04/04/2015    Procedure: ESOPHAGOGASTRODUODENOSCOPY (EGD);  Surgeon: Manya Silvas, MD;  Location: [facility] ENDOSCOPY;  Service: Endoscopy;  Laterality: N/A;   SOCIAL HISTORY: Social History  Substance Use Topics  . Smoking status: Former Smoker -- 20 years    Types: Cigarettes  . Smokeless tobacco: Not on file  . Alcohol Use: 0.0 oz/week    0 Standard drinks or equivalent per week     Comment: Wine every once in a while  20-pack-year smoking history, quit many years ago.  Denies alcohol usage.   No Known Allergies  Current Outpatient Prescriptions  Medication Sig Dispense Refill  . BREO ELLIPTA 100-25 MCG/INH AEPB Inhale 1 puff into the lungs every morning.    . digoxin (LANOXIN) 0.125 MG tablet Take 1 tablet by mouth 1 day or 1 dose.    . ferrous sulfate 325 (65 FE) MG tablet Take by mouth.    . folic acid (  FOLVITE) 1 MG tablet Take 1 tablet by mouth 1 day or 1 dose.  11  . furosemide (LASIX) 20 MG tablet Take 1 tablet by mouth 1 day or 1 dose. In the morning    . meloxicam (MOBIC) 7.5 MG tablet Take by mouth.    . methotrexate (RHEUMATREX) 2.5 MG tablet Take 1 tablet by mouth every 7 (seven) days.  1  . montelukast (SINGULAIR) 10 MG tablet Take 1 tablet by  mouth at bedtime.    . Multiple Vitamins-Minerals (PRESERVISION AREDS PO) Take 1 tablet by mouth 1 day or 1 dose.    . Multiple Vitamins-Minerals (PRESERVISION AREDS PO) Take by mouth.    . ondansetron (ZOFRAN) 4 MG tablet Take 1 tablet by mouth every 6 (six) hours as needed.    . predniSONE (DELTASONE) 5 MG tablet TAKE 1 TABLET (5 MG TOTAL) BY MOUTH ONCE DAILY.  1  . apixaban (ELIQUIS) 2.5 MG TABS tablet Take 2.5 mg by mouth 2 (two) times daily.    . polyethylene glycol powder (GLYCOLAX/MIRALAX) powder TAKE 255 G BY MOUTH ONCE. USE AS DIRECTED FOR COLON PREP.  0   No current facility-administered medications for this visit.   PHYSICAL EXAM: GENERAL: alert and oriented and in no acute distress. no icterus. Mild pallor. HEENT: EOMs intact. No cervical lymphadenopathy. CVS: S1S2, regular LUNGS: Bilaterally clear to auscultation, no rhonchi. ABDOMEN: Soft, nontender. No hepatosplenomegaly clinically.  EXTREMITIES: No pedal edema.   LAB RESULTS:    Component Value Date/Time   NA 140 01/22/2015 0345   NA 139 12/27/2012 1135   K 3.7 01/22/2015 0345   K 4.3 12/27/2012 1135   CL 106 01/22/2015 0345   CL 105 12/27/2012 1135   CO2 25 01/22/2015 0345   CO2 28 12/27/2012 1135   GLUCOSE 89 01/22/2015 0345   GLUCOSE 96 12/27/2012 1135   BUN 16 01/22/2015 0345   BUN 15 12/27/2012 1135   CREATININE 0.88 01/22/2015 0345   CREATININE 0.85 12/27/2012 1135   CALCIUM 7.7* 01/22/2015 0345   CALCIUM 8.8 12/27/2012 1135   PROT 6.8 01/21/2015 1946   ALBUMIN 3.3* 01/21/2015 1946   AST 20 01/21/2015 1946   ALT 6* 01/21/2015 1946   ALKPHOS 86 01/21/2015 1946   BILITOT 0.9 01/21/2015 1946   GFRNONAA >60 01/22/2015 0345   GFRNONAA 81* 12/27/2012 1135   GFRAA >60 01/22/2015 0345   GFRAA >90 12/27/2012 1135     Lab Results  Component Value Date   WBC 26.4* 05/27/2015   NEUTROABS 23.9* 05/27/2015   HGB 9.5* 05/27/2015   HCT 28.9* 05/27/2015   MCV 94.3 05/27/2015   PLT 222 05/27/2015            ASSESSMENT / PLAN:   1. Persistent Leukocytosis with Neutrophilia and Monocytosis, ?secondary to prednisone versus underlying myeloproliferative disorder versus both. Last bone marrow biopsy did not find any definite evidence of hematologic neoplasia including CMML (chronic myelomonocytic leukemia), that given that he continues to have slowly progressive chronic leukocytosis patient was explained that he most likely has some form of myeloproliferative disorder - reviewed labs from today and d/w patient. Given that he otherwise asymptomatic from this, no treatment is indicated at this time and plan is continued observation with monitoring of labs. Will monitor CBC/differential q8 weekly. Next MD f/u at 24 weeks wth labs and make further plan of management. 2. Anemia -  Recent iron study doing better, continue to monitor.  3. In between visits, the patient has been advised  to call or come to the ER in case of fevers, night sweats, bleeding, acute sickness or new symptoms. Patient is agreeable to this plan.   Leia Alf, MD   06/11/2015 11:04 PM

## 2015-06-18 DIAGNOSIS — M0579 Rheumatoid arthritis with rheumatoid factor of multiple sites without organ or systems involvement: Secondary | ICD-10-CM | POA: Diagnosis not present

## 2015-06-18 DIAGNOSIS — Z79899 Other long term (current) drug therapy: Secondary | ICD-10-CM | POA: Diagnosis not present

## 2015-06-18 DIAGNOSIS — C9110 Chronic lymphocytic leukemia of B-cell type not having achieved remission: Secondary | ICD-10-CM | POA: Diagnosis not present

## 2015-06-18 DIAGNOSIS — I482 Chronic atrial fibrillation: Secondary | ICD-10-CM | POA: Diagnosis not present

## 2015-07-01 DIAGNOSIS — H524 Presbyopia: Secondary | ICD-10-CM | POA: Diagnosis not present

## 2015-07-01 DIAGNOSIS — H5213 Myopia, bilateral: Secondary | ICD-10-CM | POA: Diagnosis not present

## 2015-07-05 NOTE — Progress Notes (Signed)
This encounter was created in error - please disregard.

## 2015-07-07 DIAGNOSIS — I482 Chronic atrial fibrillation: Secondary | ICD-10-CM | POA: Diagnosis not present

## 2015-07-08 DIAGNOSIS — J441 Chronic obstructive pulmonary disease with (acute) exacerbation: Secondary | ICD-10-CM | POA: Diagnosis not present

## 2015-07-08 DIAGNOSIS — R0602 Shortness of breath: Secondary | ICD-10-CM | POA: Diagnosis not present

## 2015-07-08 DIAGNOSIS — I482 Chronic atrial fibrillation: Secondary | ICD-10-CM | POA: Diagnosis not present

## 2015-07-08 DIAGNOSIS — B009 Herpesviral infection, unspecified: Secondary | ICD-10-CM | POA: Diagnosis not present

## 2015-07-08 DIAGNOSIS — C9290 Myeloid leukemia, unspecified, not having achieved remission: Secondary | ICD-10-CM | POA: Diagnosis not present

## 2015-07-14 ENCOUNTER — Other Ambulatory Visit: Payer: Self-pay | Admitting: Internal Medicine

## 2015-07-14 ENCOUNTER — Ambulatory Visit
Admission: RE | Admit: 2015-07-14 | Discharge: 2015-07-14 | Disposition: A | Discharge status: Home / Self Care | Payer: Medicare Other | Source: Ambulatory Visit | Attending: Internal Medicine | Admitting: Internal Medicine

## 2015-07-14 DIAGNOSIS — J449 Chronic obstructive pulmonary disease, unspecified: Secondary | ICD-10-CM | POA: Insufficient documentation

## 2015-07-14 DIAGNOSIS — R0602 Shortness of breath: Secondary | ICD-10-CM | POA: Diagnosis not present

## 2015-07-14 DIAGNOSIS — I509 Heart failure, unspecified: Secondary | ICD-10-CM | POA: Diagnosis not present

## 2015-07-17 DIAGNOSIS — C9290 Myeloid leukemia, unspecified, not having achieved remission: Secondary | ICD-10-CM | POA: Diagnosis not present

## 2015-07-17 DIAGNOSIS — J309 Allergic rhinitis, unspecified: Secondary | ICD-10-CM | POA: Diagnosis not present

## 2015-07-17 DIAGNOSIS — R0602 Shortness of breath: Secondary | ICD-10-CM | POA: Diagnosis not present

## 2015-07-17 DIAGNOSIS — J449 Chronic obstructive pulmonary disease, unspecified: Secondary | ICD-10-CM | POA: Diagnosis not present

## 2015-07-17 DIAGNOSIS — I482 Chronic atrial fibrillation: Secondary | ICD-10-CM | POA: Diagnosis not present

## 2015-07-22 ENCOUNTER — Inpatient Hospital Stay: Payer: Medicare Other | Attending: Internal Medicine

## 2015-07-22 DIAGNOSIS — D509 Iron deficiency anemia, unspecified: Secondary | ICD-10-CM | POA: Diagnosis not present

## 2015-07-22 DIAGNOSIS — D72829 Elevated white blood cell count, unspecified: Secondary | ICD-10-CM

## 2015-07-22 LAB — CBC WITH DIFFERENTIAL/PLATELET
Basophils Absolute: 0.2 10*3/uL — ABNORMAL HIGH (ref 0–0.1)
Basophils Relative: 1 %
EOS ABS: 0.1 10*3/uL (ref 0–0.7)
EOS PCT: 1 %
HCT: 28 % — ABNORMAL LOW (ref 40.0–52.0)
Hemoglobin: 9 g/dL — ABNORMAL LOW (ref 13.0–18.0)
LYMPHS ABS: 1 10*3/uL (ref 1.0–3.6)
Lymphocytes Relative: 4 %
MCH: 32.1 pg (ref 26.0–34.0)
MCHC: 32.2 g/dL (ref 32.0–36.0)
MCV: 99.6 fL (ref 80.0–100.0)
MONO ABS: 1.1 10*3/uL — AB (ref 0.2–1.0)
Monocytes Relative: 5 %
Neutro Abs: 21.8 10*3/uL — ABNORMAL HIGH (ref 1.4–6.5)
Neutrophils Relative %: 89 %
PLATELETS: 346 10*3/uL (ref 150–440)
RBC: 2.81 MIL/uL — AB (ref 4.40–5.90)
RDW: 21.4 % — AB (ref 11.5–14.5)
WBC: 24.2 10*3/uL — AB (ref 3.8–10.6)

## 2015-08-05 DIAGNOSIS — I482 Chronic atrial fibrillation: Secondary | ICD-10-CM | POA: Diagnosis not present

## 2015-08-05 NOTE — Progress Notes (Signed)
This encounter was created in error - please disregard.

## 2015-08-06 ENCOUNTER — Emergency Department: Payer: Medicare Other

## 2015-08-06 ENCOUNTER — Inpatient Hospital Stay
Admission: EM | Admit: 2015-08-06 | Discharge: 2015-08-11 | DRG: 871 | Disposition: A | Discharge status: Home Health | Payer: Medicare Other | Attending: Internal Medicine | Admitting: Internal Medicine

## 2015-08-06 ENCOUNTER — Other Ambulatory Visit: Payer: Self-pay

## 2015-08-06 DIAGNOSIS — I110 Hypertensive heart disease with heart failure: Secondary | ICD-10-CM | POA: Diagnosis not present

## 2015-08-06 DIAGNOSIS — K219 Gastro-esophageal reflux disease without esophagitis: Secondary | ICD-10-CM | POA: Diagnosis present

## 2015-08-06 DIAGNOSIS — J9601 Acute respiratory failure with hypoxia: Secondary | ICD-10-CM

## 2015-08-06 DIAGNOSIS — J851 Abscess of lung with pneumonia: Secondary | ICD-10-CM

## 2015-08-06 DIAGNOSIS — I9589 Other hypotension: Secondary | ICD-10-CM | POA: Diagnosis present

## 2015-08-06 DIAGNOSIS — E872 Acidosis, unspecified: Secondary | ICD-10-CM

## 2015-08-06 DIAGNOSIS — R05 Cough: Secondary | ICD-10-CM | POA: Diagnosis not present

## 2015-08-06 DIAGNOSIS — I350 Nonrheumatic aortic (valve) stenosis: Secondary | ICD-10-CM | POA: Diagnosis present

## 2015-08-06 DIAGNOSIS — J189 Pneumonia, unspecified organism: Secondary | ICD-10-CM | POA: Diagnosis not present

## 2015-08-06 DIAGNOSIS — D638 Anemia in other chronic diseases classified elsewhere: Secondary | ICD-10-CM | POA: Diagnosis not present

## 2015-08-06 DIAGNOSIS — R059 Cough, unspecified: Secondary | ICD-10-CM

## 2015-08-06 DIAGNOSIS — R6521 Severe sepsis with septic shock: Secondary | ICD-10-CM | POA: Diagnosis present

## 2015-08-06 DIAGNOSIS — J9621 Acute and chronic respiratory failure with hypoxia: Secondary | ICD-10-CM | POA: Diagnosis present

## 2015-08-06 DIAGNOSIS — Z7901 Long term (current) use of anticoagulants: Secondary | ICD-10-CM | POA: Diagnosis not present

## 2015-08-06 DIAGNOSIS — I4891 Unspecified atrial fibrillation: Secondary | ICD-10-CM | POA: Diagnosis present

## 2015-08-06 DIAGNOSIS — Z7952 Long term (current) use of systemic steroids: Secondary | ICD-10-CM | POA: Diagnosis not present

## 2015-08-06 DIAGNOSIS — Z8249 Family history of ischemic heart disease and other diseases of the circulatory system: Secondary | ICD-10-CM

## 2015-08-06 DIAGNOSIS — I272 Other secondary pulmonary hypertension: Secondary | ICD-10-CM | POA: Diagnosis present

## 2015-08-06 DIAGNOSIS — E785 Hyperlipidemia, unspecified: Secondary | ICD-10-CM | POA: Diagnosis present

## 2015-08-06 DIAGNOSIS — M1990 Unspecified osteoarthritis, unspecified site: Secondary | ICD-10-CM | POA: Diagnosis not present

## 2015-08-06 DIAGNOSIS — Z79899 Other long term (current) drug therapy: Secondary | ICD-10-CM | POA: Diagnosis not present

## 2015-08-06 DIAGNOSIS — R402411 Glasgow coma scale score 13-15, in the field [EMT or ambulance]: Secondary | ICD-10-CM | POA: Diagnosis not present

## 2015-08-06 DIAGNOSIS — Z8546 Personal history of malignant neoplasm of prostate: Secondary | ICD-10-CM | POA: Diagnosis not present

## 2015-08-06 DIAGNOSIS — R509 Fever, unspecified: Secondary | ICD-10-CM | POA: Diagnosis not present

## 2015-08-06 DIAGNOSIS — Z87891 Personal history of nicotine dependence: Secondary | ICD-10-CM

## 2015-08-06 DIAGNOSIS — G9341 Metabolic encephalopathy: Secondary | ICD-10-CM | POA: Diagnosis not present

## 2015-08-06 DIAGNOSIS — J449 Chronic obstructive pulmonary disease, unspecified: Secondary | ICD-10-CM | POA: Diagnosis not present

## 2015-08-06 DIAGNOSIS — R0902 Hypoxemia: Secondary | ICD-10-CM

## 2015-08-06 DIAGNOSIS — J181 Lobar pneumonia, unspecified organism: Secondary | ICD-10-CM | POA: Diagnosis not present

## 2015-08-06 DIAGNOSIS — I5032 Chronic diastolic (congestive) heart failure: Secondary | ICD-10-CM | POA: Diagnosis present

## 2015-08-06 DIAGNOSIS — A419 Sepsis, unspecified organism: Secondary | ICD-10-CM | POA: Diagnosis not present

## 2015-08-06 DIAGNOSIS — R531 Weakness: Secondary | ICD-10-CM

## 2015-08-06 LAB — CBC WITH DIFFERENTIAL/PLATELET
BASOS ABS: 0.4 10*3/uL — AB (ref 0–0.1)
BASOS PCT: 1 %
EOS ABS: 0.2 10*3/uL (ref 0–0.7)
EOS PCT: 1 %
HEMATOCRIT: 26.3 % — AB (ref 40.0–52.0)
Hemoglobin: 8.3 g/dL — ABNORMAL LOW (ref 13.0–18.0)
Lymphocytes Relative: 2 %
Lymphs Abs: 0.7 10*3/uL — ABNORMAL LOW (ref 1.0–3.6)
MCH: 32.7 pg (ref 26.0–34.0)
MCHC: 31.6 g/dL — AB (ref 32.0–36.0)
MCV: 103.3 fL — ABNORMAL HIGH (ref 80.0–100.0)
MONO ABS: 1.1 10*3/uL — AB (ref 0.2–1.0)
MONOS PCT: 3 %
Neutro Abs: 33 10*3/uL — ABNORMAL HIGH (ref 1.4–6.5)
Neutrophils Relative %: 93 %
PLATELETS: 210 10*3/uL (ref 150–440)
RBC: 2.55 MIL/uL — ABNORMAL LOW (ref 4.40–5.90)
RDW: 22.4 % — AB (ref 11.5–14.5)
WBC: 35.3 10*3/uL — ABNORMAL HIGH (ref 3.8–10.6)

## 2015-08-06 LAB — CBC
HEMATOCRIT: 23.6 % — AB (ref 40.0–52.0)
HEMATOCRIT: 21.4 % — AB (ref 40.0–52.0)
HEMOGLOBIN: 7.2 g/dL — AB (ref 13.0–18.0)
Hemoglobin: 6.7 g/dL — ABNORMAL LOW (ref 13.0–18.0)
MCH: 31.8 pg (ref 26.0–34.0)
MCH: 32.4 pg (ref 26.0–34.0)
MCHC: 30.4 g/dL — ABNORMAL LOW (ref 32.0–36.0)
MCHC: 31.2 g/dL — ABNORMAL LOW (ref 32.0–36.0)
MCV: 104.8 fL — ABNORMAL HIGH (ref 80.0–100.0)
MCV: 104 fL — AB (ref 80.0–100.0)
Platelets: 210 10*3/uL (ref 150–440)
Platelets: 188 10*3/uL (ref 150–440)
RBC: 2.25 MIL/uL — ABNORMAL LOW (ref 4.40–5.90)
RBC: 2.06 MIL/uL — AB (ref 4.40–5.90)
RDW: 22.4 % — ABNORMAL HIGH (ref 11.5–14.5)
RDW: 23 % — AB (ref 11.5–14.5)
WBC: 56.5 10*3/uL — AB (ref 3.8–10.6)
WBC: 49.1 10*3/uL — AB (ref 3.8–10.6)

## 2015-08-06 LAB — COMPREHENSIVE METABOLIC PANEL
ALBUMIN: 3.2 g/dL — AB (ref 3.5–5.0)
ALK PHOS: 71 U/L (ref 38–126)
ALT: 13 U/L — AB (ref 17–63)
ANION GAP: 13 (ref 5–15)
AST: 25 U/L (ref 15–41)
BILIRUBIN TOTAL: 0.5 mg/dL (ref 0.3–1.2)
BUN: 26 mg/dL — ABNORMAL HIGH (ref 6–20)
CALCIUM: 8.6 mg/dL — AB (ref 8.9–10.3)
CO2: 24 mmol/L (ref 22–32)
CREATININE: 1.22 mg/dL (ref 0.61–1.24)
Chloride: 102 mmol/L (ref 101–111)
GFR calc Af Amer: >60 mL/min (ref >60)
GFR calc non Af Amer: 54 mL/min — ABNORMAL LOW (ref >60)
GLUCOSE: 140 mg/dL — AB (ref 65–99)
Potassium: 4 mmol/L (ref 3.5–5.1)
Sodium: 139 mmol/L (ref 135–145)
TOTAL PROTEIN: 6.7 g/dL (ref 6.5–8.1)

## 2015-08-06 LAB — BLOOD GAS, ARTERIAL
ACID-BASE EXCESS: 1.1 mmol/L (ref 0.0–3.0)
Allens test (pass/fail): POSITIVE — AB
BICARBONATE: 24.7 meq/L (ref 21.0–28.0)
FIO2: 0.4
O2 SAT: 94.5 %
PH ART: 7.47 — AB (ref 7.350–7.450)
Patient temperature: 37
pCO2 arterial: 34 mmHg (ref 32.0–48.0)
pO2, Arterial: 68 mmHg — ABNORMAL LOW (ref 83.0–108.0)

## 2015-08-06 LAB — BASIC METABOLIC PANEL
Anion gap: 4 — ABNORMAL LOW (ref 5–15)
BUN: 21 mg/dL — AB (ref 6–20)
CHLORIDE: 111 mmol/L (ref 101–111)
CO2: 23 mmol/L (ref 22–32)
CREATININE: 1.16 mg/dL (ref 0.61–1.24)
Calcium: 7.4 mg/dL — ABNORMAL LOW (ref 8.9–10.3)
GFR calc Af Amer: >60 mL/min (ref >60)
GFR, EST NON AFRICAN AMERICAN: 57 mL/min — AB (ref >60)
GLUCOSE: 151 mg/dL — AB (ref 65–99)
Potassium: 3.5 mmol/L (ref 3.5–5.1)
Sodium: 138 mmol/L (ref 135–145)

## 2015-08-06 LAB — ABO/RH: ABO/RH(D): O NEG

## 2015-08-06 LAB — PROTIME-INR
INR: 2.24
Prothrombin Time: 24.6 seconds — ABNORMAL HIGH (ref 11.4–15.0)

## 2015-08-06 LAB — LACTIC ACID, PLASMA
LACTIC ACID, VENOUS: 4 mmol/L — AB (ref 0.5–2.0)
LACTIC ACID, VENOUS: 1.9 mmol/L (ref 0.5–2.0)
Lactic Acid, Venous: 1.9 mmol/L (ref 0.5–2.0)

## 2015-08-06 LAB — MRSA PCR SCREENING: MRSA by PCR: NEGATIVE

## 2015-08-06 LAB — PREPARE RBC (CROSSMATCH)

## 2015-08-06 MED ORDER — IPRATROPIUM-ALBUTEROL 0.5-2.5 (3) MG/3ML IN SOLN
6.0000 mL | Freq: Once | RESPIRATORY_TRACT | Status: AC
  Administered 2015-08-06: 6 mL via RESPIRATORY_TRACT

## 2015-08-06 MED ORDER — ALUM & MAG HYDROXIDE-SIMETH 200-200-20 MG/5ML PO SUSP
30.0000 mL | Freq: Once | ORAL | Status: AC
  Administered 2015-08-06: 30 mL via ORAL
  Filled 2015-08-06: qty 30

## 2015-08-06 MED ORDER — DIGOXIN 125 MCG PO TABS
0.1250 mg | ORAL_TABLET | Freq: Every day | ORAL | Status: DC
  Administered 2015-08-06 – 2015-08-11 (×6): 0.125 mg via ORAL
  Filled 2015-08-06 (×6): qty 1

## 2015-08-06 MED ORDER — ONDANSETRON HCL 4 MG/2ML IJ SOLN
4.0000 mg | Freq: Four times a day (QID) | INTRAMUSCULAR | Status: DC | PRN
  Administered 2015-08-07: 4 mg via INTRAVENOUS
  Filled 2015-08-06: qty 2

## 2015-08-06 MED ORDER — PIPERACILLIN-TAZOBACTAM 3.375 G IVPB
3.3750 g | Freq: Three times a day (TID) | INTRAVENOUS | Status: DC
  Administered 2015-08-06 – 2015-08-10 (×12): 3.375 g via INTRAVENOUS
  Filled 2015-08-06 (×15): qty 50

## 2015-08-06 MED ORDER — MONTELUKAST SODIUM 10 MG PO TABS
10.0000 mg | ORAL_TABLET | Freq: Every day | ORAL | Status: DC
Start: 2015-08-06 — End: 2015-08-11
  Administered 2015-08-06 – 2015-08-11 (×6): 10 mg via ORAL
  Filled 2015-08-06 (×6): qty 1

## 2015-08-06 MED ORDER — PREDNISOLONE 5 MG PO TABS
5.0000 mg | ORAL_TABLET | Freq: Every day | ORAL | Status: DC
  Filled 2015-08-06: qty 1

## 2015-08-06 MED ORDER — PIPERACILLIN-TAZOBACTAM 3.375 G IVPB 30 MIN
3.3750 g | Freq: Three times a day (TID) | INTRAVENOUS | Status: DC
  Administered 2015-08-06 (×2): 3.375 g via INTRAVENOUS
  Filled 2015-08-06 (×4): qty 50

## 2015-08-06 MED ORDER — SODIUM CHLORIDE 0.9 % IV BOLUS (SEPSIS)
500.0000 mL | Freq: Once | INTRAVENOUS | Status: AC
Start: 2015-08-06 — End: 2015-08-06
  Administered 2015-08-06: 500 mL via INTRAVENOUS

## 2015-08-06 MED ORDER — ZOLPIDEM TARTRATE 5 MG PO TABS
5.0000 mg | ORAL_TABLET | Freq: Once | ORAL | Status: AC
  Administered 2015-08-06: 5 mg via ORAL
  Filled 2015-08-06: qty 1

## 2015-08-06 MED ORDER — VANCOMYCIN HCL IN DEXTROSE 1-5 GM/200ML-% IV SOLN
1000.0000 mg | Freq: Once | INTRAVENOUS | Status: AC
  Administered 2015-08-06: 1000 mg via INTRAVENOUS
  Filled 2015-08-06: qty 200

## 2015-08-06 MED ORDER — NOREPINEPHRINE 4 MG/250ML-% IV SOLN
INTRAVENOUS | Status: AC
  Filled 2015-08-06: qty 250

## 2015-08-06 MED ORDER — HYDROCODONE-ACETAMINOPHEN 5-325 MG PO TABS: 1.5000 | ORAL_TABLET | ORAL | Status: DC | PRN

## 2015-08-06 MED ORDER — WARFARIN - PHARMACIST DOSING INPATIENT
Freq: Every day | Status: DC
  Administered 2015-08-06 – 2015-08-10 (×5)

## 2015-08-06 MED ORDER — IPRATROPIUM-ALBUTEROL 0.5-2.5 (3) MG/3ML IN SOLN: 3.0000 mL | RESPIRATORY_TRACT | Status: DC | PRN

## 2015-08-06 MED ORDER — WARFARIN - PHYSICIAN DOSING INPATIENT: Freq: Every day | Status: DC

## 2015-08-06 MED ORDER — SODIUM CHLORIDE 0.9 % IV BOLUS (SEPSIS)
1000.0000 mL | Freq: Once | INTRAVENOUS | Status: AC
  Administered 2015-08-06: 1000 mL via INTRAVENOUS

## 2015-08-06 MED ORDER — HEPARIN SODIUM (PORCINE) 5000 UNIT/ML IJ SOLN: 5000.0000 [IU] | Freq: Three times a day (TID) | INTRAMUSCULAR | Status: DC

## 2015-08-06 MED ORDER — ACETAMINOPHEN 650 MG RE SUPP
650.0000 mg | Freq: Once | RECTAL | Status: AC
  Administered 2015-08-06: 650 mg via RECTAL

## 2015-08-06 MED ORDER — SODIUM CHLORIDE 0.9 % IJ SOLN
3.0000 mL | Freq: Two times a day (BID) | INTRAMUSCULAR | Status: DC
  Administered 2015-08-06 – 2015-08-10 (×8): 3 mL via INTRAVENOUS

## 2015-08-06 MED ORDER — PIPERACILLIN-TAZOBACTAM 3.375 G IVPB
3.3750 g | Freq: Three times a day (TID) | INTRAVENOUS | Status: DC
  Filled 2015-08-06 (×3): qty 50

## 2015-08-06 MED ORDER — HYDROCODONE-IBUPROFEN 7.5-200 MG PO TABS: 1.0000 | ORAL_TABLET | ORAL | Status: DC | PRN

## 2015-08-06 MED ORDER — IPRATROPIUM-ALBUTEROL 0.5-2.5 (3) MG/3ML IN SOLN
RESPIRATORY_TRACT | Status: AC
  Filled 2015-08-06: qty 3

## 2015-08-06 MED ORDER — FERROUS SULFATE 325 (65 FE) MG PO TABS
325.0000 mg | ORAL_TABLET | Freq: Every morning | ORAL | Status: DC
  Administered 2015-08-06 – 2015-08-11 (×6): 325 mg via ORAL
  Filled 2015-08-06 (×6): qty 1

## 2015-08-06 MED ORDER — IBUPROFEN 600 MG PO TABS
600.0000 mg | ORAL_TABLET | Freq: Once | ORAL | Status: AC
  Administered 2015-08-06: 600 mg via ORAL
  Filled 2015-08-06: qty 1

## 2015-08-06 MED ORDER — ONDANSETRON HCL 4 MG PO TABS: 4.0000 mg | ORAL_TABLET | Freq: Four times a day (QID) | ORAL | Status: DC | PRN

## 2015-08-06 MED ORDER — WARFARIN SODIUM 4 MG PO TABS
4.0000 mg | ORAL_TABLET | Freq: Every day | ORAL | Status: DC
  Administered 2015-08-06: 4 mg via ORAL
  Filled 2015-08-06: qty 1

## 2015-08-06 MED ORDER — SODIUM CHLORIDE 0.9 % IV BOLUS (SEPSIS)
1000.0000 mL | INTRAVENOUS | Status: AC
  Administered 2015-08-06: 1000 mL via INTRAVENOUS

## 2015-08-06 MED ORDER — SODIUM CHLORIDE 0.9 % IV SOLN
Freq: Once | INTRAVENOUS | Status: AC
  Administered 2015-08-06: 05:00:00 via INTRAVENOUS

## 2015-08-06 MED ORDER — MORPHINE SULFATE (PF) 2 MG/ML IV SOLN
2.0000 mg | INTRAVENOUS | Status: DC | PRN
  Filled 2015-08-06: qty 1

## 2015-08-06 MED ORDER — NOREPINEPHRINE 4 MG/250ML-% IV SOLN
0.0000 ug/min | INTRAVENOUS | Status: DC
  Administered 2015-08-06: 4 ug/min via INTRAVENOUS
  Filled 2015-08-06: qty 250

## 2015-08-06 MED ORDER — PREDNISONE 10 MG PO TABS
5.0000 mg | ORAL_TABLET | Freq: Every day | ORAL | Status: DC
  Administered 2015-08-06 – 2015-08-11 (×6): 5 mg via ORAL
  Filled 2015-08-06 (×2): qty 5
  Filled 2015-08-06 (×5): qty 1

## 2015-08-06 MED ORDER — OXYCODONE HCL 5 MG PO TABS: 5.0000 mg | ORAL_TABLET | ORAL | Status: DC | PRN

## 2015-08-06 MED ORDER — FLUTICASONE FUROATE-VILANTEROL 100-25 MCG/INH IN AEPB: 1.0000 | INHALATION_SPRAY | RESPIRATORY_TRACT | Status: DC

## 2015-08-06 MED ORDER — FOLIC ACID 1 MG PO TABS
1.0000 mg | ORAL_TABLET | Freq: Every day | ORAL | Status: DC
  Administered 2015-08-06 – 2015-08-11 (×6): 1 mg via ORAL
  Filled 2015-08-06 (×6): qty 1

## 2015-08-06 MED ORDER — NOREPINEPHRINE BITARTRATE 1 MG/ML IV SOLN
5.0000 ug/min | Freq: Once | INTRAVENOUS | Status: DC
  Administered 2015-08-06: 5 ug/min via INTRAVENOUS

## 2015-08-06 MED ORDER — ACETAMINOPHEN 650 MG RE SUPP
RECTAL | Status: AC
  Filled 2015-08-06: qty 1

## 2015-08-06 MED ORDER — ACETAMINOPHEN 325 MG PO TABS
650.0000 mg | ORAL_TABLET | Freq: Once | ORAL | Status: AC
  Administered 2015-08-06: 650 mg via ORAL
  Filled 2015-08-06: qty 2

## 2015-08-06 MED ORDER — ACETAMINOPHEN 650 MG RE SUPP: 650.0000 mg | Freq: Four times a day (QID) | RECTAL | Status: DC | PRN

## 2015-08-06 MED ORDER — SODIUM CHLORIDE 0.9 % IV SOLN
INTRAVENOUS | Status: DC
  Administered 2015-08-06 – 2015-08-07 (×2): via INTRAVENOUS

## 2015-08-06 MED ORDER — SODIUM CHLORIDE 0.9 % IV SOLN: Freq: Once | INTRAVENOUS | Status: DC

## 2015-08-06 MED ORDER — BUDESONIDE 0.25 MG/2ML IN SUSP
0.2500 mg | Freq: Two times a day (BID) | RESPIRATORY_TRACT | Status: DC
  Administered 2015-08-06 – 2015-08-10 (×9): 0.25 mg via RESPIRATORY_TRACT
  Filled 2015-08-06 (×10): qty 2

## 2015-08-06 MED ORDER — ACETAMINOPHEN 325 MG PO TABS
650.0000 mg | ORAL_TABLET | Freq: Four times a day (QID) | ORAL | Status: DC | PRN
  Filled 2015-08-06: qty 2

## 2015-08-06 MED ORDER — ARFORMOTEROL TARTRATE 15 MCG/2ML IN NEBU
15.0000 ug | INHALATION_SOLUTION | Freq: Two times a day (BID) | RESPIRATORY_TRACT | Status: DC
  Administered 2015-08-06 – 2015-08-10 (×9): 15 ug via RESPIRATORY_TRACT
  Filled 2015-08-06 (×13): qty 2

## 2015-08-06 MED ORDER — VANCOMYCIN HCL IN DEXTROSE 1-5 GM/200ML-% IV SOLN
1000.0000 mg | INTRAVENOUS | Status: DC
  Administered 2015-08-06 – 2015-08-07 (×3): 1000 mg via INTRAVENOUS
  Filled 2015-08-06 (×5): qty 200

## 2015-08-06 MED ORDER — SODIUM CHLORIDE 0.9 % IV BOLUS (SEPSIS)
500.0000 mL | INTRAVENOUS | Status: AC
  Administered 2015-08-06: 02:00:00 via INTRAVENOUS

## 2015-08-06 NOTE — ED Notes (Signed)
Pt resting in bed, family at bedside. No increased work in breathing noted. Skin warm and dry.

## 2015-08-06 NOTE — H&P (Signed)
[place] at [place] NAME: Thomas Townsend    MR#:  [id number]  DATE OF BIRTH:  Apr 17, 1931   DATE OF ADMISSION:  08/06/2015  PRIMARY CARE PHYSICIAN: Allyne Gee, MD   REQUESTING/REFERRING PHYSICIAN: Marjean Donna  CHIEF COMPLAINT:   Chief Complaint  Patient presents with  . Altered Mental Status    HISTORY OF PRESENT ILLNESS:  Auron Minor  is a 81 y.o. male with a known history of COPD non-option requiring, atrial fibrillation on warfarin who is presenting with altered mental status. Patient unable to provide meaningful information given mental status/medical condition. History obtained from daughter who is present at bedside. She describes these been recently having issues with dentition to the point of actually having a procedure performed approximately one week ago. He is now presenting with altered mental status described as confusion with associated fever, cough, shortness of breath. Apparently cough is nonproductive shortness of breath and cough are both essentially at baseline per the daughter. Upon arrival to the emergent department noted to be febrile tachycardic tachypneic  PAST MEDICAL HISTORY:   Past Medical History  Diagnosis Date  . COPD (chronic obstructive pulmonary disease) ([place])   . Atrial fibrillation ([place])   . Prostate cancer ([place])   . GERD (gastroesophageal reflux disease)   . CHF (congestive heart failure) ([place])   . Dysrhythmia   . Arthritis   . Hypertension   . Hyperlipemia     PAST SURGICAL HISTORY:   Past Surgical History  Procedure Laterality Date  . Cholecystectomy    . Colonoscopy with propofol N/A 04/04/2015    Procedure: COLONOSCOPY WITH PROPOFOL;  Surgeon: Manya Silvas, MD;  Location: [facility] ENDOSCOPY;  Service: Endoscopy;  Laterality: N/A;  . Esophagogastroduodenoscopy N/A 04/04/2015    Procedure: ESOPHAGOGASTRODUODENOSCOPY (EGD);  Surgeon: Manya Silvas, MD;  Location: [facility]  ENDOSCOPY;  Service: Endoscopy;  Laterality: N/A;    SOCIAL HISTORY:   Social History  Substance Use Topics  . Smoking status: Former Smoker -- 20 years    Types: Cigarettes  . Smokeless tobacco: Not on file  . Alcohol Use: 0.0 oz/week    0 Standard drinks or equivalent per week     Comment: Wine every once in a while    FAMILY HISTORY:   Family History  Problem Relation Age of Onset  . Hypertension Other     DRUG ALLERGIES:  No Known Allergies  REVIEW OF SYSTEMS:  Unobtainable given patient's mental status/medical condition  MEDICATIONS AT HOME:   Prior to Admission medications   Medication Sig Start Date End Date Taking? Authorizing Provider  BREO ELLIPTA 100-25 MCG/INH AEPB Inhale 1 puff into the lungs every morning. 01/28/15  Yes Historical Provider, MD  digoxin (LANOXIN) 0.125 MG tablet Take 0.125 mg by mouth daily.    Yes Historical Provider, MD  ferrous sulfate 325 (65 FE) MG tablet Take 325 mg by mouth every morning.    Yes Historical Provider, MD  folic acid (FOLVITE) 1 MG tablet Take 1 mg by mouth daily.    Yes Historical Provider, MD  furosemide (LASIX) 20 MG tablet Take 20 mg by mouth daily. In the morning   Yes Historical Provider, MD  HYDROcodone-ibuprofen (VICOPROFEN) 7.5-200 MG tablet Take 1 tablet by mouth every 8 (eight) hours as needed.   Yes Historical Provider, MD  methotrexate (RHEUMATREX) 2.5 MG tablet Take 15 mg by mouth every 7 (seven) days.    Yes Historical Provider, MD  montelukast (  SINGULAIR) 10 MG tablet Take 1 tablet by mouth daily.    Yes Historical Provider, MD  Multiple Vitamins-Minerals (PRESERVISION AREDS PO) Take 1 tablet by mouth daily.    Yes Historical Provider, MD  prednisoLONE 5 MG TABS tablet Take 5 mg by mouth daily.   Yes Historical Provider, MD  warfarin (COUMADIN) 4 MG tablet Take 4 mg by mouth daily.   Yes Historical Provider, MD      VITAL SIGNS:  Blood pressure 100/50, pulse 117, temperature 100.8 F (38.2 C), temperature  source Rectal, resp. rate 22, weight 152 lb (68.947 kg), SpO2 91 %.  PHYSICAL EXAMINATION:  VITAL SIGNS: Filed Vitals:   08/06/15 0242  BP: 100/50  Pulse: 117  Temp: 100.8 F (38.2 C)  Resp: 22   GENERAL:81 y.o.male currently inmoderate  acute distress Given mental status.  HEAD: Normocephalic, atraumatic.  EYES: Pupils equal, round, reactive to light. Extraocular muscles intact. No scleral icterus.  MOUTH:Dry mucosal  membrane. Dentitionpoor. No abscess noted.  EAR, NOSE, THROAT: Clear without exudates. No external lesions.  NECK: Supple. No thyromegaly. No nodules. No JVD.  PULMONARY:Tachypneic coarse rhonchi right base nasal accessory muscles, Good respiratory effort. good air entry bilaterally CHEST: Nontender to palpation.  CARDIOVASCULAR: S1 and S2.Irregular rate and irregular rhythm. No murmurs, rubs, or gallops. No edema. Pedal pulses 2+ bilaterally.  GASTROINTESTINAL: Soft, nontender, nondistended. No masses. Positive bowel sounds. No hepatosplenomegaly.  MUSCULOSKELETAL: No swelling, clubbing, or edema. Range of motion full in all extremities.  NEUROLOGIC:Unable to fully assess given patient's mental status/medical condition spontaneous movement of all extremities, able to mumble incoherently to verbal commands  SKIN: No ulceration, lesions, rashes, or cyanosis. Skin warm and dry. Turgor intact.  PSYCHIATRIC:Unable to fully assess given patient's mental status medical condition  LABORATORY PANEL:   CBC  Recent Labs Lab 08/06/15 0135  WBC 35.3*  HGB 8.3*  HCT 26.3*  PLT 210   ------------------------------------------------------------------------------------------------------------------  Chemistries   Recent Labs Lab 08/06/15 0135  NA 139  K 4.0  CL 102  CO2 24  GLUCOSE 140*  BUN 26*  CREATININE 1.22  CALCIUM 8.6*  AST 25  ALT 13*  ALKPHOS 71  BILITOT 0.5    ------------------------------------------------------------------------------------------------------------------  Cardiac Enzymes No results for input(s): TROPONINI in the last 168 hours. ------------------------------------------------------------------------------------------------------------------  RADIOLOGY:  Dg Chest Port 1 View  08/06/2015  CLINICAL DATA:  Fever hypoxia and cough EXAM: PORTABLE CHEST 1 VIEW COMPARISON:  07/14/2015 FINDINGS: New airspace disease at the right base.  No effusion or cavitation. Background hyperinflation and interstitial coarsening compatible with patient's known COPD. Normal heart size and aortic contours. IMPRESSION: Right basilar pneumonia. Electronically Signed   By: Monte Fantasia M.D.   On: 08/06/2015 01:59    EKG:   Orders placed or performed during the hospital encounter of 08/06/15  . ED EKG 12-Lead  . ED EKG 12-Lead    IMPRESSION AND PLAN:   81 year old Caucasian gentleman history of atrial fibrillation on warfarin presenting with altered mental status.  1.Sepsis, meeting septic criteria by leukocytosis, temperature, respiratory rate, heart rate present on arrival. Source urinary acquired pneumonia Code sepsis initiated. Panculture. Broad-spectrum antibiotics including ankle license/Zosyn and taper antibiotics when culture data returns. He has not received a 30 mL/kg IV fluid bolus given congestive heart failure. Continue IV fluid hydration to keep mean arterial pressure greater than 65. He may require pressor therapy if blood pressure worsens. We will repeat lactic acid if the initial is greater than 2.2.   2. Atrial  fibrillation rapid ventricular response: Some improvements after IV fluid hydration will managed telemetry, heart rate continuously less than 120 May require Cardizem when necessary versus Cardizem drip and future if no improvement, continue warfarin for adequate ventilation 3. COPD not in acute exacerbation: Continue oxygen  as stated above, DuoNeb treatments, home medications 4. Venous thrombo emboli prophylactic: Therapeutic warfarin    All the records are reviewed and case discussed with ED provider. Management plans discussed with the patient, family and they are in agreement.  CODE STATUS: Full  TOTAL TIME TAKING CARE OF THIS PATIENT: 45  minutes.    Hower,  Karenann Cai.D on 08/06/2015 at 3:41 AM  Between 7am to 6pm - Pager - [phone]  After 6pm: House Pager: - [phone]  [facility]  Office  [phone]  CC: Primary care physician; Allyne Gee, MD

## 2015-08-06 NOTE — ED Notes (Signed)
Pt presents to ED from home via EMS. Pt had dental work done 1 week ago with problems since. Pt is hot to touch, dry heaving, BP with EMS is 96/41. Family found pt screaming deceased wife's name, walking around, and disoriented. Pt has hx of a fib, COPD, cancer, aplastic anemia.

## 2015-08-06 NOTE — Progress Notes (Addendum)
[place] at [place] NAME: Thomas Townsend    MR#:  [id number]  DATE OF BIRTH:  25-Jun-1931  SUBJECTIVE:  CHIEF COMPLAINT:   Chief Complaint  Patient presents with  . Altered Mental Status   the patient is alert, awake and oriented. He has no complaints. On Levophed drip.  REVIEW OF SYSTEMS:  CONSTITUTIONAL: No fever, fatigue or weakness.  EYES: No blurred or double vision.  EARS, NOSE, AND THROAT: No tinnitus or ear pain.  RESPIRATORY: No cough, shortness of breath, wheezing or hemoptysis.  CARDIOVASCULAR: No chest pain, orthopnea, edema.  GASTROINTESTINAL: No nausea, vomiting, diarrhea or abdominal pain.  GENITOURINARY: No dysuria, hematuria.  ENDOCRINE: No polyuria, nocturia,  HEMATOLOGY: No anemia, easy bruising or bleeding SKIN: No rash or lesion. MUSCULOSKELETAL: No joint pain or arthritis.   NEUROLOGIC: No tingling, numbness, weakness.  PSYCHIATRY: No anxiety or depression.   DRUG ALLERGIES:  No Known Allergies  VITALS:  Blood pressure 86/48, pulse 107, temperature 97.7 F (36.5 C), temperature source Oral, resp. rate 16, weight 68.947 kg (152 lb), SpO2 95 %.  PHYSICAL EXAMINATION:  GENERAL:  81 y.o.-year-old patient lying in the bed with no acute distress.  EYES: Pupils equal, round, reactive to light and accommodation. No scleral icterus. Extraocular muscles intact.  HEENT: Head atraumatic, normocephalic. Oropharynx and nasopharynx clear.  NECK:  Supple, no jugular venous distention. No thyroid enlargement, no tenderness.  LUNGS: Coarse breath sounds bilaterally, no wheezing, but has right-sided crackles. No use of accessory muscles of respiration.  CARDIOVASCULAR: S1, S2 normal. No murmurs, rubs, or gallops.  ABDOMEN: Soft, nontender, nondistended. Bowel sounds present. No organomegaly or mass.  EXTREMITIES: No pedal edema, cyanosis, or clubbing.  NEUROLOGIC: Cranial nerves II through XII are intact. Muscle  strength 5/5 in all extremities. Sensation intact. Gait not checked.  PSYCHIATRIC: The patient is alert and oriented x 3.  SKIN: No obvious rash, lesion, or ulcer.    LABORATORY PANEL:   CBC  Recent Labs Lab 08/06/15 1342  WBC 49.1*  HGB 6.7*  HCT 21.4*  PLT 188   ------------------------------------------------------------------------------------------------------------------  Chemistries   Recent Labs Lab 08/06/15 0135 08/06/15 0849  NA 139 138  K 4.0 3.5  CL 102 111  CO2 24 23  GLUCOSE 140* 151*  BUN 26* 21*  CREATININE 1.22 1.16  CALCIUM 8.6* 7.4*  AST 25  --   ALT 13*  --   ALKPHOS 71  --   BILITOT 0.5  --    ------------------------------------------------------------------------------------------------------------------  Cardiac Enzymes No results for input(s): TROPONINI in the last 168 hours. ------------------------------------------------------------------------------------------------------------------  RADIOLOGY:  Dg Chest Port 1 View  08/06/2015  CLINICAL DATA:  Fever hypoxia and cough EXAM: PORTABLE CHEST 1 VIEW COMPARISON:  07/14/2015 FINDINGS: New airspace disease at the right base.  No effusion or cavitation. Background hyperinflation and interstitial coarsening compatible with patient's known COPD. Normal heart size and aortic contours. IMPRESSION: Right basilar pneumonia. Electronically Signed   By: Monte Fantasia M.D.   On: 08/06/2015 01:59    EKG:   Orders placed or performed during the hospital encounter of 08/06/15  . ED EKG 12-Lead  . ED EKG 12-Lead    ASSESSMENT AND PLAN:   1. Septic shock with pneumonia. Started Levophed drip. Continue vancomycin and Zosyn, follow-up panculture and the CBC. Continue IV fluid support.  2. Atrial fibrillation rapid ventricular response:  Continue IV fluid hydration, continue digoxin and warfarin, follow-up INR. No Cardizem or Lopressor due to  septic shock.  3. COPD not in acute exacerbation:  Continue oxygen as stated above, DuoNeb treatments, home medications  4. History of CHF. Unclear type. Stable. Watch for fluid overload.   *Anemia of chronic disease. No active bleeding. Hemoglobin decreased to 6.7.  PRBC transfusion 1 units in the follow-up CBC.  * Acute metabolic encephalopathy. Improved.  * Lactic Acidosis. Improved  All the records are reviewed and case discussed with Care Management/Social Workerr. Management plans discussed with the patient, his son and they are in agreement. Greater than 50% time was spent on coordination of care and face-to-face counseling.  CODE STATUS: Full code  TOTAL CRITICAL TIME TAKING CARE OF THIS PATIENT: 48 minutes.   POSSIBLE D/C IN >3 DAYS, DEPENDING ON CLINICAL CONDITION.   Demetrios Loll M.D on 08/06/2015 at 3:29 PM  Between 7am to 6pm - Pager - [phone]  After 6pm go to [email or web address] - password [email or web address] [facility]  Office  [phone]  CC: Primary care physician; Allyne Gee, MD

## 2015-08-06 NOTE — ED Notes (Signed)
Pt swallowed pill without difficulty 

## 2015-08-06 NOTE — Progress Notes (Signed)
[place] Progress Note Patient Name: Thomas Townsend DOB: 1931/02/05 MRN: [id number]   Date of Service  08/06/2015  HPI/Events of Note  insomina  eICU Interventions  Add ambien low dose     Intervention Category Minor Interventions: Routine modifications to care plan (e.g. PRN medications for pain, fever)  Raylene Miyamoto. 08/06/2015, 10:09 PM

## 2015-08-06 NOTE — ED Notes (Addendum)
Dr. Owens Shark notified of pt blood pressure, verbal order to increase rate to 10 mcg/min.

## 2015-08-06 NOTE — Progress Notes (Signed)
Notified Dr. Stevenson Clinch of patient's WBC 49.1 and hgb 6.7- patient showing no signs of bleeding.  No new orders at this time.

## 2015-08-06 NOTE — ED Notes (Addendum)
Pt daughter is in room. She states that pt has had oral sores x 1 month, teeth extraction 1 week ago. Today pt was yelling deceased wife's name and walking around. Daughter states pt has not been able to eat much since teeth extraction, only had crackers and soup today. Daughter states pt's status has changed in 5 hours.

## 2015-08-06 NOTE — ED Notes (Signed)
Pt belching continuously in room, reports has hx of acid reflux.

## 2015-08-06 NOTE — Progress Notes (Signed)
ANTICOAGULATION CONSULT NOTE - Initial Consult  Pharmacy Consult for Coumadin Indication: atrial fibrillation  No Known Allergies  Patient Measurements: Weight: 152 lb (68.947 kg)   Vital Signs: Temp: 97.7 F (36.5 C) (11/09 0822) Temp Source: Oral (11/09 0822) BP: 122/57 mmHg (11/09 1030) Pulse Rate: 116 (11/09 1027)  Labs:  Recent Labs  08/06/15 0135 08/06/15 0849 08/06/15 0935  HGB 8.3* 7.2*  --   HCT 26.3* 23.6*  --   PLT 210 210  --   LABPROT  --   --  24.6*  INR  --   --  2.24  CREATININE 1.22 1.16  --     Estimated Creatinine Clearance: 46.7 mL/min (by C-G formula based on Cr of 1.16).   Medical History: Past Medical History  Diagnosis Date  . COPD (chronic obstructive pulmonary disease) ([place])   . Atrial fibrillation ([place])   . Prostate cancer ([place])   . GERD (gastroesophageal reflux disease)   . CHF (congestive heart failure) ([place])   . Dysrhythmia   . Arthritis   . Hypertension   . Hyperlipemia     Medications:  Scheduled:  . arformoterol  15 mcg Nebulization BID  . budesonide (PULMICORT) nebulizer solution  0.25 mg Nebulization BID  . digoxin  0.125 mg Oral Daily  . ferrous sulfate  325 mg Oral q morning - 73U  . folic acid  1 mg Oral Daily  . montelukast  10 mg Oral Daily  . piperacillin-tazobactam (ZOSYN)  IV  3.375 g Intravenous Q8H  . prednisoLONE  5 mg Oral Daily  . sodium chloride  3 mL Intravenous Q12H  . vancomycin  1,000 mg Intravenous Q18H  . warfarin  4 mg Oral Daily  . Warfarin - Pharmacist Dosing Inpatient   Does not apply q1800   Infusions:  . sodium chloride 75 mL/hr at 08/06/15 0838  . norepinephrine 5 mcg/min (08/06/15 1145)    Assessment: 81 y/o M admitted with sepsis on Coumadin 4 mg daily PTA. INR Is therapeutic, however patient is on antibiotics which may increase INR.   Goal of Therapy:  INR 2-3   Plan:  Will continue Coumadin 4 mg daily and f/u AM INR.    Ulice Dash D 08/06/2015,11:47 AM

## 2015-08-06 NOTE — ED Notes (Signed)
Upon entering room, pt's lip is bleeding. Pt has sores on lip that are cracked.

## 2015-08-06 NOTE — ED Provider Notes (Signed)
[facility] Emergency Department Provider Note  ____________________________________________  Time seen: 1:20 AM  I have reviewed the triage vital signs and the nursing notes.  Street Limited secondary to altered mental status HISTORY  Chief Complaint Altered Mental Status      HPI Thomas Townsend is a 81 y.o. male presents with fever altered mental status status post dental surgery approximately one week ago. Patient noted to be febrile on presentation here with a temperature 102.8. Patient also noted to be hypotensive on presentation with a blood pressure of 92/54. As well as tachypnea restaurateur rate of 24 with hypoxia O2 sat 72% on room air       Past Medical History  Diagnosis Date  . COPD (chronic obstructive pulmonary disease) ([place])   . Atrial fibrillation ([place])   . Prostate cancer ([place])   . GERD (gastroesophageal reflux disease)   . CHF (congestive heart failure) ([place])   . Dysrhythmia   . Arthritis   . Hypertension   . Hyperlipemia     Patient Active Problem List   Diagnosis Date Noted  . Rheumatoid arthritis involving multiple joints ([place]) 01/28/2015  . Arthritis or polyarthritis, rheumatoid ([place]) 01/28/2015  . Elevated WBC count 11/18/2014  . A-fib ([place]) 03/04/2014  . CAFL (chronic airflow limitation) ([place]) 03/04/2014  . Acid reflux 03/04/2014  . Diastolic dysfunction 23/76/2831    Past Surgical History  Procedure Laterality Date  . Cholecystectomy    . Colonoscopy with propofol N/A 04/04/2015    Procedure: COLONOSCOPY WITH PROPOFOL;  Surgeon: Manya Silvas, MD;  Location: [facility] ENDOSCOPY;  Service: Endoscopy;  Laterality: N/A;  . Esophagogastroduodenoscopy N/A 04/04/2015    Procedure: ESOPHAGOGASTRODUODENOSCOPY (EGD);  Surgeon: Manya Silvas, MD;  Location: [facility] ENDOSCOPY;  Service: Endoscopy;  Laterality: N/A;    Current Outpatient Rx  Name  Route  Sig  Dispense  Refill  . BREO ELLIPTA 100-25 MCG/INH AEPB   Inhalation    Inhale 1 puff into the lungs every morning.           Dispense as written.   . digoxin (LANOXIN) 0.125 MG tablet   Oral   Take 0.125 mg by mouth daily.          . ferrous sulfate 325 (65 FE) MG tablet   Oral   Take 325 mg by mouth every morning.          . folic acid (FOLVITE) 1 MG tablet   Oral   Take 1 mg by mouth daily.       11   . furosemide (LASIX) 20 MG tablet   Oral   Take 20 mg by mouth daily. In the morning         . HYDROcodone-ibuprofen (VICOPROFEN) 7.5-200 MG tablet   Oral   Take 1 tablet by mouth every 8 (eight) hours as needed.         . methotrexate (RHEUMATREX) 2.5 MG tablet   Oral   Take 15 mg by mouth every 7 (seven) days.       1   . montelukast (SINGULAIR) 10 MG tablet   Oral   Take 1 tablet by mouth daily.          . Multiple Vitamins-Minerals (PRESERVISION AREDS PO)   Oral   Take 1 tablet by mouth daily.          . prednisoLONE 5 MG TABS tablet   Oral   Take 5 mg by mouth daily.         Marland Kitchen  warfarin (COUMADIN) 4 MG tablet   Oral   Take 4 mg by mouth daily.           Allergies No Known drug allergies No family history on file.  Social History Social History  Substance Use Topics  . Smoking status: Former Smoker -- 20 years    Types: Cigarettes  . Smokeless tobacco: None  . Alcohol Use: 0.0 oz/week    0 Standard drinks or equivalent per week     Comment: Wine every once in a while    Review of Systems  Constitutional: positive for fever. Eyes: Negative for visual changes. ENT: Negative for sore throat. Cardiovascular: Negative for chest pain. Respiratory: Negative for shortness of breath.positive cough Gastrointestinal: Negative for abdominal pain, vomiting and diarrhea. Genitourinary: Negative for dysuria. Musculoskeletal: Negative for back pain. Skin: Negative for rash. Neurological: Negative for headaches, focal weakness or numbness.  10-point ROS otherwise  negative.  ____________________________________________   PHYSICAL EXAM:  VITAL SIGNS: ED Triage Vitals  Enc Vitals Group     BP 08/06/15 0121 92/54 mmHg     Pulse Rate 08/06/15 0121 90     Resp 08/06/15 0121 13     Temp 08/06/15 0121 102.8 F (39.3 C)     Temp Source 08/06/15 0121 Rectal     SpO2 08/06/15 0121 96 %     Weight 08/06/15 0121 152 lb (68.947 kg)     Height --      Head Cir --      Peak Flow --      Pain Score --      Pain Loc --      Pain Edu? --      Excl. in [place]? --      Constitutional: Alert and oriented. Well appearing and in no distress. Eyes: Conjunctivae are normal. PERRL. Normal extraocular movements. ENT   Head: Normocephalic and atraumatic.   Nose: No congestion/rhinnorhea.   Mouth/Throat: Mucous membranes are moist.   Neck: No stridor. Cardiovascular: tachycardia regular regular rhythmNormal and symmetric distal pulses are present in all extremities. No murmurs, rubs, or gallops. Respiratory: tachypnea effort right lower lobe rhonchi Gastrointestinal: Soft and nontender. No distention. There is no CVA tenderness. Genitourinary: deferred Musculoskeletal: Nontender with normal range of motion in all extremities. No joint effusions.  No lower extremity tenderness nor edema. Neurologic:  Normal speech and language. No gross focal neurologic deficits are appreciated. Speech is normal.  Skin:  Skin is warm, dry and intact. No rash noted. Psychiatric: Mood and affect are normal. Speech and behavior are normal. Patient exhibits appropriate insight and judgment.  ____________________________________________    LABS (pertinent positives/negatives)  Labs Reviewed  COMPREHENSIVE METABOLIC PANEL - Abnormal; Notable for the following:    Glucose, Bld 140 (*)    BUN 26 (*)    Calcium 8.6 (*)    Albumin 3.2 (*)    ALT 13 (*)    GFR calc non Af Amer 54 (*)    All other components within normal limits  CBC WITH DIFFERENTIAL/PLATELET -  Abnormal; Notable for the following:    WBC 35.3 (*)    RBC 2.55 (*)    Hemoglobin 8.3 (*)    HCT 26.3 (*)    MCV 103.3 (*)    MCHC 31.6 (*)    RDW 22.4 (*)    Neutro Abs 33.0 (*)    Lymphs Abs 0.7 (*)    Monocytes Absolute 1.1 (*)    Basophils Absolute 0.4 (*)  All other components within normal limits  BLOOD GAS, ARTERIAL - Abnormal; Notable for the following:    pH, Arterial 7.47 (*)    pO2, Arterial 68 (*)    Allens test (pass/fail) POSITIVE (*)    All other components within normal limits  LACTIC ACID, PLASMA - Abnormal; Notable for the following:    Lactic Acid, Venous 4.0 (*)    All other components within normal limits  CULTURE, BLOOD (ROUTINE X 2)  CULTURE, BLOOD (ROUTINE X 2)  URINE CULTURE  LACTIC ACID, PLASMA  CBC  CREATININE, SERUM  BASIC METABOLIC PANEL  CBC     ____________________________________________   EKG  ED ECG REPORT I, BROWN, [place] N, the attending physician, personally viewed and interpreted this ECG.   Date: 08/06/2015  EKG Time: 1:25 AM  Rate: 78  Rhythm: atrial fibrillation  Axis: None  Intervals:normal  ST&T Change: none   ____________________________________________    RADIOLOGY   DG Chest Port 1 View (Final result) Result time: 08/06/15 01:59:38   Procedure changed from [facility] Chest 2 View      Final result by Rad Results In Interface (08/06/15 01:59:38)   Narrative:   CLINICAL DATA: Fever hypoxia and cough  EXAM: PORTABLE CHEST 1 VIEW  COMPARISON: 07/14/2015  FINDINGS: New airspace disease at the right base. No effusion or cavitation.  Background hyperinflation and interstitial coarsening compatible with patient's known COPD. Normal heart size and aortic contours.  IMPRESSION: Right basilar pneumonia.   Electronically Signed By: Monte Fantasia M.D. On: 08/06/2015 01:59        ECG Results       Critical Care performed: CRITICAL CARE Performed by: Marjean Donna N   Total  critical care time: 76minutes  Critical care time was exclusive of separately billable procedures and treating other patients.  Critical care was necessary to treat or prevent imminent or life-threatening deterioration.  Critical care was time spent personally by me on the following activities: development of treatment plan with patient and/or surrogate as well as nursing, discussions with consultants, evaluation of patient's response to treatment, examination of patient, obtaining history from patient or surrogate, ordering and performing treatments and interventions, ordering and review of laboratory studies, ordering and review of radiographic studies, pulse oximetry and re-evaluation of patient's condition.   ____________________________________________   INITIAL IMPRESSION / ASSESSMENT AND PLAN / ED COURSE  Pertinent labs & imaging results that were available during my care of the patient were reviewed by me and considered in my medical decision making (see chart for details).  History of physical exam consistent with sepsis most likely secondary to pneumonia. Chest x-ray confirmed right lower lobe pneumonia. Patient received 30 ML's per kilogram of normal saline IV as well as vancomycin and Zosyn on presentation to the emergency department. Patient discussed with Dr. Lavetta Nielsen for hospital admission for further management.  ____________________________________________   FINAL CLINICAL IMPRESSION(S) / ED DIAGNOSES  Final diagnoses:  Sepsis, due to unspecified organism [facility])  Abscess of lower lobe of right lung with pneumonia [facility])      Gregor Hams, MD 08/06/15 [phone]

## 2015-08-06 NOTE — ED Notes (Signed)
Pt hypotensive 74/44 blood pressure, Dr. Owens Shark ordered 2 L NS bolus.

## 2015-08-06 NOTE — Progress Notes (Signed)
ANTIBIOTIC CONSULT NOTE - INITIAL  Pharmacy Consult for vancomycin/Zosyn Indication: rule out pneumonia  No Known Allergies  Patient Measurements: Weight: 152 lb (68.947 kg) Adjusted Body Weight:   Vital Signs: Temp: 102.8 F (39.3 C) (11/09 0121) Temp Source: Rectal (11/09 0121) BP: 99/47 mmHg (11/09 0155) Pulse Rate: 102 (11/09 0155) Intake/Output from previous day: 11/08 0701 - 11/09 0700 In: -  Out: 300 [Urine:300] Intake/Output from this shift: Total I/O In: -  Out: 300 [Urine:300]  Labs:  Recent Labs  08/06/15 0135  WBC 35.3*  HGB 8.3*  PLT 210  CREATININE 1.22   Estimated Creatinine Clearance: 44.4 mL/min (by C-G formula based on Cr of 1.22). No results for input(s): VANCOTROUGH, VANCOPEAK, VANCORANDOM, GENTTROUGH, GENTPEAK, GENTRANDOM, TOBRATROUGH, TOBRAPEAK, TOBRARND, AMIKACINPEAK, AMIKACINTROU, AMIKACIN in the last 72 hours.   Microbiology: No results found for this or any previous visit (from the past 720 hour(s)).  Medical History: Past Medical History  Diagnosis Date  . COPD (chronic obstructive pulmonary disease) ([place])   . Atrial fibrillation ([place])   . Prostate cancer ([place])   . GERD (gastroesophageal reflux disease)   . CHF (congestive heart failure) ([place])   . Dysrhythmia   . Arthritis   . Hypertension   . Hyperlipemia     Medications:   Assessment: Thomas Townsend cc AMX, hx AF, COPD, cancer, aplastic anemia, recent dental extraction. Starting broad spectrum abx for fever, hypoxia, cough, and CXR showing right basilar pneumonia.   Vd 47 L, Ke 0.042 hr-1, T1/2 16.5 hr.   Goal of Therapy:  Vancomycin trough level 15-20 mcg/ml  Plan:  Expected duration 7 days with resolution of temperature and/or normalization of WBC. Zosyn 3.375 gm Q8H EI and vancomycin 1 gm IV Q18H with stacked dosing, second dose 9 hours after first, predicted trough 19 mcg/mL, will continue to follow and adjust as needed to achieve trough 15 to 20 mcg/mL.   Laural Benes,  Pharm.D.  Clinical Pharmacist 08/06/2015,2:12 AM

## 2015-08-07 LAB — BASIC METABOLIC PANEL
Anion gap: 3 — ABNORMAL LOW (ref 5–15)
BUN: 15 mg/dL (ref 6–20)
CO2: 24 mmol/L (ref 22–32)
Calcium: 7.6 mg/dL — ABNORMAL LOW (ref 8.9–10.3)
Chloride: 112 mmol/L — ABNORMAL HIGH (ref 101–111)
Creatinine, Ser: 0.94 mg/dL (ref 0.61–1.24)
Glucose, Bld: 134 mg/dL — ABNORMAL HIGH (ref 65–99)
POTASSIUM: 4.1 mmol/L (ref 3.5–5.1)
SODIUM: 139 mmol/L (ref 135–145)

## 2015-08-07 LAB — CBC WITH DIFFERENTIAL/PLATELET
BASOS ABS: 0 10*3/uL (ref 0–0.1)
Basophils Relative: 0 %
Eosinophils Absolute: 0.4 10*3/uL (ref 0–0.7)
Eosinophils Relative: 1 %
HCT: 24.6 % — ABNORMAL LOW (ref 40.0–52.0)
HEMOGLOBIN: 7.8 g/dL — AB (ref 13.0–18.0)
LYMPHS ABS: 0.7 10*3/uL — AB (ref 1.0–3.6)
Lymphocytes Relative: 2 %
MCH: 31.2 pg (ref 26.0–34.0)
MCHC: 31.9 g/dL — ABNORMAL LOW (ref 32.0–36.0)
MCV: 97.8 fL (ref 80.0–100.0)
MONO ABS: 2.2 10*3/uL — AB (ref 0.2–1.0)
MONOS PCT: 6 %
NEUTROS PCT: 91 %
Neutro Abs: 33.8 10*3/uL — ABNORMAL HIGH (ref 1.4–6.5)
PLATELETS: 181 10*3/uL (ref 150–440)
RBC: 2.51 MIL/uL — AB (ref 4.40–5.90)
RDW: 27.5 % — ABNORMAL HIGH (ref 11.5–14.5)
WBC: 37.1 10*3/uL — AB (ref 3.8–10.6)

## 2015-08-07 LAB — PROTIME-INR
INR: 2.74
PROTHROMBIN TIME: 28.6 s — AB (ref 11.4–15.0)

## 2015-08-07 LAB — URINE CULTURE: CULTURE: NO GROWTH

## 2015-08-07 LAB — EXPECTORATED SPUTUM ASSESSMENT W REFEX TO RESP CULTURE

## 2015-08-07 LAB — EXPECTORATED SPUTUM ASSESSMENT W GRAM STAIN, RFLX TO RESP C

## 2015-08-07 MED ORDER — IPRATROPIUM-ALBUTEROL 0.5-2.5 (3) MG/3ML IN SOLN
3.0000 mL | Freq: Four times a day (QID) | RESPIRATORY_TRACT | Status: AC
  Administered 2015-08-07 – 2015-08-09 (×6): 3 mL via RESPIRATORY_TRACT
  Filled 2015-08-07 (×7): qty 3

## 2015-08-07 MED ORDER — WARFARIN SODIUM 4 MG PO TABS
4.0000 mg | ORAL_TABLET | Freq: Every day | ORAL | Status: DC
  Administered 2015-08-08: 18:00:00 4 mg via ORAL
  Filled 2015-08-07: qty 1

## 2015-08-07 MED ORDER — WARFARIN SODIUM 1 MG PO TABS
2.0000 mg | ORAL_TABLET | Freq: Once | ORAL | Status: AC
  Administered 2015-08-07: 2 mg via ORAL
  Filled 2015-08-07: qty 2

## 2015-08-07 MED ORDER — ENSURE ENLIVE PO LIQD
237.0000 mL | Freq: Two times a day (BID) | ORAL | Status: DC
  Administered 2015-08-08 – 2015-08-11 (×7): 237 mL via ORAL

## 2015-08-07 NOTE — Progress Notes (Signed)
ANTICOAGULATION CONSULT NOTE - Initial Consult  Pharmacy Consult for Coumadin Indication: atrial fibrillation  No Known Allergies  Patient Measurements: Weight: 152 lb (68.947 kg)   Vital Signs: Temp: 98.5 F (36.9 C) (11/10 0800) Temp Source: Oral (11/10 0800) BP: 92/50 mmHg (11/10 1200) Pulse Rate: 95 (11/10 0900)  Labs:  Recent Labs  08/06/15 0135 08/06/15 0849 08/06/15 0935 08/06/15 1342 08/07/15 0423  HGB 8.3* 7.2*  --  6.7* 7.8*  HCT 26.3* 23.6*  --  21.4* 24.6*  PLT 210 210  --  188 181  LABPROT  --   --  24.6*  --  28.6*  INR  --   --  2.24  --  2.74  CREATININE 1.22 1.16  --   --  0.94    Estimated Creatinine Clearance: 57.6 mL/min (by C-G formula based on Cr of 0.94).   Medical History: Past Medical History  Diagnosis Date  . COPD (chronic obstructive pulmonary disease) ([place])   . Atrial fibrillation ([place])   . Prostate cancer ([place])   . GERD (gastroesophageal reflux disease)   . CHF (congestive heart failure) ([place])   . Dysrhythmia   . Arthritis   . Hypertension   . Hyperlipemia     Medications:  Scheduled:  . sodium chloride   Intravenous Once  . arformoterol  15 mcg Nebulization BID  . budesonide (PULMICORT) nebulizer solution  0.25 mg Nebulization BID  . digoxin  0.125 mg Oral Daily  . ferrous sulfate  325 mg Oral q morning - [id number]  . folic acid  1 mg Oral Daily  . ipratropium-albuterol  3 mL Nebulization Q6H  . montelukast  10 mg Oral Daily  . piperacillin-tazobactam (ZOSYN)  IV  3.375 g Intravenous Q8H  . predniSONE  5 mg Oral Q breakfast  . sodium chloride  3 mL Intravenous Q12H  . vancomycin  1,000 mg Intravenous Q18H  . warfarin  2 mg Oral ONCE-1800  . [START ON 08/08/2015] warfarin  4 mg Oral Daily  . Warfarin - Pharmacist Dosing Inpatient   Does not apply q1800   Infusions:  . sodium chloride 75 mL/hr at 08/07/15 0700  . norepinephrine 0 mcg/min (08/07/15 0700)    Assessment: 81 y/o M admitted with sepsis on Coumadin 4 mg  daily PTA. INR Is therapeutic, however patient is on antibiotics which may increase INR.   Goal of Therapy:  INR 2-3   Plan:  INR remains therapeutic but increased significantly from yesterday. Due to potential for increased INR with antibiotics, will empirically decrease warfarin dose to 2 mg today and f/u AM INR.    Ulice Dash D 08/07/2015,12:25 PM

## 2015-08-07 NOTE — Progress Notes (Addendum)
[place] [facility] [place] NAME: Thomas Townsend    MR#:  [id number]  DATE OF BIRTH:  Jun 07, 1931  SUBJECTIVE:  CHIEF COMPLAINT:   Chief Complaint  Patient presents with  . Altered Mental Status   the patient is alert, awake but lethargic. He had respiratory distress last night and put on NRB per RN. He has no complaints. Off Levophed drip. On O2 [place] 3L.  REVIEW OF SYSTEMS:  CONSTITUTIONAL: No fever, has generalized weakness.  EYES: No blurred or double vision.  EARS, NOSE, AND THROAT: No tinnitus or ear pain.  RESPIRATORY: No cough, shortness of breath, wheezing or hemoptysis.  CARDIOVASCULAR: No chest pain, orthopnea, edema.  GASTROINTESTINAL: No nausea, vomiting, diarrhea or abdominal pain.  GENITOURINARY: No dysuria, hematuria.  ENDOCRINE: No polyuria, nocturia,  HEMATOLOGY: No anemia, easy bruising or bleeding SKIN: No rash or lesion. MUSCULOSKELETAL: No joint pain or arthritis.   NEUROLOGIC: No tingling, numbness, weakness.  PSYCHIATRY: No anxiety or depression.   DRUG ALLERGIES:  No Known Allergies  VITALS:  Blood pressure 91/73, pulse 95, temperature 98.5 F (36.9 C), temperature source Oral, resp. rate 31, weight 68.947 kg (152 lb), SpO2 95 %.  PHYSICAL EXAMINATION:  GENERAL:  81 y.o.-year-old patient lying in the bed with no acute distress.  EYES: Pupils equal, round, reactive to light and accommodation. No scleral icterus. Extraocular muscles intact.  HEENT: Head atraumatic, normocephalic. Oropharynx and nasopharynx clear.  NECK:  Supple, no jugular venous distention. No thyroid enlargement, no tenderness.  LUNGS: Coarse breath sounds bilaterally, no wheezing, but has right-sided crackles. No use of accessory muscles of respiration.  CARDIOVASCULAR: S1, S2 normal. No murmurs, rubs, or gallops.  ABDOMEN: Soft, nontender, nondistended. Bowel sounds present. No organomegaly or mass.  EXTREMITIES: No pedal edema, cyanosis, or  clubbing.  NEUROLOGIC: Cranial nerves II through XII are intact. Muscle strength 4/5 in all extremities. Sensation intact. Gait not checked.  PSYCHIATRIC: The patient is alert but lethargic.  SKIN: No obvious rash, lesion, or ulcer.    LABORATORY PANEL:   CBC  Recent Labs Lab 08/07/15 0423  WBC 37.1*  HGB 7.8*  HCT 24.6*  PLT 181   ------------------------------------------------------------------------------------------------------------------  Chemistries   Recent Labs Lab 08/06/15 0135  08/07/15 0423  NA 139  < > 139  K 4.0  < > 4.1  CL 102  < > 112*  CO2 24  < > 24  GLUCOSE 140*  < > 134*  BUN 26*  < > 15  CREATININE 1.22  < > 0.94  CALCIUM 8.6*  < > 7.6*  AST 25  --   --   ALT 13*  --   --   ALKPHOS 71  --   --   BILITOT 0.5  --   --   < > = values in this interval not displayed. ------------------------------------------------------------------------------------------------------------------  Cardiac Enzymes No results for input(s): TROPONINI in the last 168 hours. ------------------------------------------------------------------------------------------------------------------  RADIOLOGY:  Dg Chest Port 1 View  08/06/2015  CLINICAL DATA:  Fever hypoxia and cough EXAM: PORTABLE CHEST 1 VIEW COMPARISON:  07/14/2015 FINDINGS: New airspace disease at the right base.  No effusion or cavitation. Background hyperinflation and interstitial coarsening compatible with patient's known COPD. Normal heart size and aortic contours. IMPRESSION: Right basilar pneumonia. Electronically Signed   By: Monte Fantasia M.D.   On: 08/06/2015 01:59    EKG:   Orders placed or performed during the hospital encounter of 08/06/15  . ED EKG  12-Lead  . ED EKG 12-Lead    ASSESSMENT AND PLAN:   1. Septic shock with pneumonia. off Levophed drip. Continue vancomycin and Zosyn, follow-up panculture and the CBC. Continue IV fluid support. WBC is down to 37.1.  2. Atrial fibrillation  rapid ventricular response:  Continue IV fluid hydration, continue digoxin and warfarin, follow-up INR. No Cardizem or Lopressor due to septic shock.  3. COPD: Continue oxygen as stated above, DuoNeb treatments, home medications  4. History of CHF. Unclear type. Stable. Watch for fluid overload.  * acute respiratory failure with hypoxia.  Continue O2 [place] and NEB.  *Anemia of chronic disease. No active bleeding. Hemoglobin decreased to 6.7. s/p PRBC transfusion 1 units,  follow-up CBC.  * Acute metabolic encephalopathy. Aspiration precaution.  * Lactic Acidosis. Improved  All the records are reviewed and case discussed with Care Management/Social Workerr. Management plans discussed with the patient, his duaghter and they are in agreement. Greater than 50% time was spent on coordination of care and face-to-face counseling.  CODE STATUS: Full code  TOTAL CRITICAL TIME TAKING CARE OF THIS PATIENT: 50 minutes.   POSSIBLE D/C IN >3 DAYS, DEPENDING ON CLINICAL CONDITION.   Demetrios Loll M.D on 08/07/2015 at 12:02 PM  Between 7am to 6pm - Pager - [phone]  After 6pm go to [email or web address] - password [email or web address] [place] [facility]  Office  [phone]  CC: Primary care physician; Allyne Gee, MD

## 2015-08-07 NOTE — Progress Notes (Signed)
ANTIBIOTIC CONSULT NOTE - INITIAL  Pharmacy Consult for vancomycin/Zosyn Indication: rule out pneumonia  No Known Allergies  Patient Measurements: Weight: 152 lb (68.947 kg) Adjusted Body Weight:   Vital Signs: Temp: 98.5 F (36.9 C) (11/10 0800) Temp Source: Oral (11/10 0800) BP: 92/50 mmHg (11/10 1200) Pulse Rate: 95 (11/10 0900) Intake/Output from previous day: 11/09 0701 - 11/10 0700 In: 3847.2 [P.O.:120; I.V.:2397.2; Blood:280; IV Piggyback:1050] Out: 1850 [Urine:1850] Intake/Output from this shift: Total I/O In: 225 [I.V.:225] Out: 1 [Stool:1]  Labs:  Recent Labs  08/06/15 0135 08/06/15 0849 08/06/15 1342 08/07/15 0423  WBC 35.3* 56.5* 49.1* 37.1*  HGB 8.3* 7.2* 6.7* 7.8*  PLT 210 210 188 181  CREATININE 1.22 1.16  --  0.94   Estimated Creatinine Clearance: 57.6 mL/min (by C-G formula based on Cr of 0.94). No results for input(s): VANCOTROUGH, VANCOPEAK, VANCORANDOM, GENTTROUGH, GENTPEAK, GENTRANDOM, TOBRATROUGH, TOBRAPEAK, TOBRARND, AMIKACINPEAK, AMIKACINTROU, AMIKACIN in the last 72 hours.   Microbiology: Recent Results (from the past 720 hour(s))  Urine culture     Status: None   Collection Time: 08/06/15  1:34 AM  Result Value Ref Range Status   Specimen Description URINE, RANDOM  Final   Special Requests NONE  Final   Culture NO GROWTH 1 DAY  Final   Report Status 08/07/2015 FINAL  Final  MRSA PCR Screening     Status: None   Collection Time: 08/06/15  8:43 AM  Result Value Ref Range Status   MRSA by PCR NEGATIVE NEGATIVE Final    Comment:        The GeneXpert MRSA Assay (FDA approved for NASAL specimens only), is one component of a comprehensive MRSA colonization surveillance program. It is not intended to diagnose MRSA infection nor to guide or monitor treatment for MRSA infections.     Medical History: Past Medical History  Diagnosis Date  . COPD (chronic obstructive pulmonary disease) ([place])   . Atrial fibrillation ([place])   .  Prostate cancer ([place])   . GERD (gastroesophageal reflux disease)   . CHF (congestive heart failure) ([place])   . Dysrhythmia   . Arthritis   . Hypertension   . Hyperlipemia     Medications:   Assessment: 83 yom cc AMX, hx AF, COPD, cancer, aplastic anemia, recent dental extraction. Starting broad spectrum abx for fever, hypoxia, cough, and CXR showing right basilar pneumonia.   Goal of Therapy:  Vancomycin trough level 15-20 mcg/ml  Plan:  Expected duration 7 days with resolution of temperature and/or normalization of WBC. Continue Zosyn 3.375 gm Q8H EI and vancomycin 1 gm IV Q18H. Trough scheduled with the 5th total dose.    Ulice Dash D, Pharm.D.  Clinical Pharmacist 08/07/2015,12:27 PM

## 2015-08-07 NOTE — Clinical Documentation Improvement (Signed)
Internal Medicine   Possible Conditions?        Respiratory Failure   Document Acuity - Acute, Chronic, Acute on Chronic  Document Inclusion Of - Hypoxia, Hypercapnia, Combination of Both  Other  Clinically Undetermined  Document any associated diagnoses/conditions.  Please update your documentation within the medical record to reflect your response to this query. Thank you.  Supporting Information:(As per notes) Pt placed on a non-rebreather O2 @ 12 l/min As well as tachypnea restaurateur rate of 24 with hypoxia O2 sat 72% on room air   Component     Latest Ref Rng 08/06/2015  FIO2      0.40  Delivery systems      OXYGEN MASK  pH, Arterial     7.350 - 7.450 7.47 (H)  pCO2 arterial     32.0 - 48.0 mmHg 34  pO2, Arterial     83.0 - 108.0 mmHg 68 (L)  Bicarbonate     21.0 - 28.0 mEq/L 24.7  Acid-Base Excess     0.0 - 3.0 mmol/L 1.1  O2 Saturation      94.5  Patient temperature      37.0  Collection site      RIGHT RADIAL  Sample type      ARTERIAL DRAW  Allens test (pass/fail)     PASS POSITIVE (A)   Please exercise your independent, professional judgment when responding. A specific answer is not anticipated or expected.  Thank You, Alessandra Grout, RN, BSN, CCDS,Clinical Documentation Specialist:  [phone]  [phone]=Cell [place]- Health Information Management

## 2015-08-07 NOTE — Evaluation (Signed)
Clinical/Bedside Swallow Evaluation Patient Details  Name: Thomas Townsend MRN: [id number] Date of Birth: 22-Nov-1930  Today's Date: 08/07/2015 Time: SLP Start Time (ACUTE ONLY): 1500 SLP Stop Time (ACUTE ONLY): 1600 SLP Time Calculation (min) (ACUTE ONLY): 60 min  Past Medical History:  Past Medical History  Diagnosis Date  . COPD (chronic obstructive pulmonary disease) ([place])   . Atrial fibrillation ([place])   . Prostate cancer ([place])   . GERD (gastroesophageal reflux disease)   . CHF (congestive heart failure) ([place])   . Dysrhythmia   . Arthritis   . Hypertension   . Hyperlipemia    Past Surgical History:  Past Surgical History  Procedure Laterality Date  . Cholecystectomy    . Colonoscopy with propofol N/A 04/04/2015    Procedure: COLONOSCOPY WITH PROPOFOL;  Surgeon: Manya Silvas, MD;  Location: [facility] ENDOSCOPY;  Service: Endoscopy;  Laterality: N/A;  . Esophagogastroduodenoscopy N/A 04/04/2015    Procedure: ESOPHAGOGASTRODUODENOSCOPY (EGD);  Surgeon: Manya Silvas, MD;  Location: [facility] ENDOSCOPY;  Service: Endoscopy;  Laterality: N/A;   HPI:  Pt is a 81 y.o. male with a known history of CHF, GERD, COPD non-option requiring, atrial fibrillation on warfarin who is presenting with altered mental status. Patient unable to provide meaningful information given mental status/medical condition. History obtained from daughter who is present at bedside. She describes these been recently having issues with dentition to the point of actually having a procedure performed approximately one week ago. He is now presenting with altered mental status described as confusion with associated fever, cough, shortness of breath. Apparently cough is nonproductive shortness of breath and cough are both essentially at baseline per the daughter. Pt recently had dental surgery in which he had 9-11 teeth removed. Pt indicated his gums are still "sore". Pt has been taking in less oral intake in the past week since  the sugery which may have contributed to his declined medical status requiring hospitalization. Pt and family indicate is eating/drinking less. Pt is verbally conversive; alert and oriented.    Assessment / Plan / Recommendation Clinical Impression  Pt appears at reduced risk for aspiration following general aspiration precautions when taking po's. Pt consumed trials of thin liquids and purees w/ no overt s/s of aspiration; no oral phase deficits during oral management. Clear vocal quality was noted b/t trials; no decline in respiratory status during/post trials(O2 sats remained in upper 90s). Pt fed self w/ support. Rec. a pureed diet texture at this time sec. to recent dental surgery/extraction w/ trials to upgrade food consistency as pt tolerates during the next week. Rec. thin liquids w/ aspiration precautions including NO straws; meds in puree as nec. Education given to family/pt re: diet consistency and food options. Rec'd oral rinsing post meal to aid oral care(possibly even salt water rinses per MD ok). NSG updated.     Aspiration Risk   (reduced following aspiration precautions)    Diet Recommendation  Dys. 1 (puree); thin liquids; dietary supplements - pt drinks Ensure at home  Medication Administration: Whole meds with liquid (w/ puree as nec if trouble swallowing w/ liquids)    Other  Recommendations Recommended Consults:  (Dietician) Oral Care Recommendations: Oral care QID;Staff/trained caregiver to provide oral care   Follow up Recommendations   (TBD)    Frequency and Duration min 2x/week  1 week       Swallow Study   General Date of Onset: 08/06/15 HPI: Pt is a 81 y.o. male with a known history of CHF, GERD,  COPD non-option requiring, atrial fibrillation on warfarin who is presenting with altered mental status. Patient unable to provide meaningful information given mental status/medical condition. History obtained from daughter who is present at bedside. She describes these  been recently having issues with dentition to the point of actually having a procedure performed approximately one week ago. He is now presenting with altered mental status described as confusion with associated fever, cough, shortness of breath. Apparently cough is nonproductive shortness of breath and cough are both essentially at baseline per the daughter. Pt recently had dental surgery in which he had 9-11 teeth removed. Pt indicated his gums are still "sore". Pt has been taking in less oral intake in the past week since the sugery which may have contributed to his declined medical status requiring hospitalization. Pt and family indicate is eating/drinking less. Pt is verbally conversive; alert and oriented.  Type of Study: Bedside Swallow Evaluation Previous Swallow Assessment: none Diet Prior to this Study: Regular;Thin liquids (prior to dental surgery/teeth extraction) Temperature Spikes Noted: No (wbc 37.1) Respiratory Status: Nasal cannula (3 liters) History of Recent Intubation: No Behavior/Cognition: Alert;Cooperative;Pleasant mood Oral Cavity Assessment: Edema (teeth extraction) Oral Care Completed by SLP: Recent completion by staff Oral Cavity - Dentition:  (teeth extraction) Vision: Functional for self-feeding Self-Feeding Abilities: Able to feed self;Needs assist;Needs set up Patient Positioning: Upright in bed Baseline Vocal Quality: Normal;Low vocal intensity Volitional Cough: Strong Volitional Swallow: Able to elicit    Oral/Motor/Sensory Function Overall Oral Motor/Sensory Function: Within functional limits   Ice Chips Ice chips: Not tested Other Comments: too cold   Thin Liquid Thin Liquid: Within functional limits Presentation: Cup;Self Fed Other Comments: ~3 ozs     Nectar Thick Nectar Thick Liquid: Not tested   Honey Thick Honey Thick Liquid: Not tested   Puree Puree: Within functional limits Presentation: Self Fed;Spoon (assisted) Other Comments: 4 trials   Solid  Solid: Not tested Other Comments: too much of an increased consistency for recent dental extraction; edema      Orinda Kenner, MS, CCC-SLP  Goerge Mohr 08/07/2015,4:35 PM

## 2015-08-08 ENCOUNTER — Inpatient Hospital Stay (HOSPITAL_COMMUNITY)
Admit: 2015-08-08 | Discharge: 2015-08-08 | Disposition: A | Discharge status: Home / Self Care | Payer: Medicare Other | Attending: Internal Medicine | Admitting: Internal Medicine

## 2015-08-08 DIAGNOSIS — I350 Nonrheumatic aortic (valve) stenosis: Secondary | ICD-10-CM

## 2015-08-08 LAB — BASIC METABOLIC PANEL
ANION GAP: 6 (ref 5–15)
BUN: 13 mg/dL (ref 6–20)
CO2: 24 mmol/L (ref 22–32)
Calcium: 7.9 mg/dL — ABNORMAL LOW (ref 8.9–10.3)
Chloride: 108 mmol/L (ref 101–111)
Creatinine, Ser: 0.87 mg/dL (ref 0.61–1.24)
Glucose, Bld: 97 mg/dL (ref 65–99)
POTASSIUM: 3.8 mmol/L (ref 3.5–5.1)
SODIUM: 138 mmol/L (ref 135–145)

## 2015-08-08 LAB — CBC WITH DIFFERENTIAL/PLATELET
BASOS ABS: 0 10*3/uL (ref 0–0.1)
Basophils Relative: 0 %
EOS ABS: 0.3 10*3/uL (ref 0–0.7)
Eosinophils Relative: 1 %
HCT: 24.2 % — ABNORMAL LOW (ref 40.0–52.0)
Hemoglobin: 7.7 g/dL — ABNORMAL LOW (ref 13.0–18.0)
Lymphocytes Relative: 8 %
Lymphs Abs: 2.3 10*3/uL (ref 1.0–3.6)
MCH: 31.2 pg (ref 26.0–34.0)
MCHC: 31.9 g/dL — ABNORMAL LOW (ref 32.0–36.0)
MCV: 97.8 fL (ref 80.0–100.0)
MONO ABS: 0.9 10*3/uL (ref 0.2–1.0)
Monocytes Relative: 3 %
NEUTROS PCT: 88 %
Neutro Abs: 25.1 10*3/uL — ABNORMAL HIGH (ref 1.4–6.5)
PLATELETS: 192 10*3/uL (ref 150–440)
RBC: 2.47 MIL/uL — AB (ref 4.40–5.90)
RDW: 27.1 % — ABNORMAL HIGH (ref 11.5–14.5)
WBC: 28.6 10*3/uL — AB (ref 3.8–10.6)

## 2015-08-08 LAB — PROTIME-INR
INR: 2.45
PROTHROMBIN TIME: 26.3 s — AB (ref 11.4–15.0)

## 2015-08-08 LAB — MAGNESIUM: Magnesium: 2.1 mg/dL (ref 1.7–2.4)

## 2015-08-08 MED ORDER — SODIUM BICARBONATE/SODIUM CHLORIDE MOUTHWASH
OROMUCOSAL | Status: DC | PRN
  Filled 2015-08-08: qty 1000

## 2015-08-08 MED ORDER — ZOLPIDEM TARTRATE 5 MG PO TABS
5.0000 mg | ORAL_TABLET | Freq: Every evening | ORAL | Status: DC | PRN
  Administered 2015-08-08 – 2015-08-09 (×2): 5 mg via ORAL
  Filled 2015-08-08 (×3): qty 1

## 2015-08-08 MED ORDER — FUROSEMIDE 10 MG/ML IJ SOLN
20.0000 mg | Freq: Once | INTRAMUSCULAR | Status: AC
  Administered 2015-08-08: 20 mg via INTRAVENOUS
  Filled 2015-08-08: qty 2

## 2015-08-08 NOTE — Progress Notes (Signed)
Speech Language Pathology Treatment: Dysphagia  Patient Details Name: Thomas Townsend MRN: [id number] DOB: [birth year] Today's Date: 08/08/2015 Time: 1115-1200 SLP Time Calculation (min) (ACUTE ONLY): 45 min  Assessment / Plan / Recommendation Clinical Impression  Pt appears at reduced risk for aspiration following general aspiration precautions when taking po's. Pt consumed trials of thin liquids and purees feeding himself w/ no overt s/s of aspiration; no oral phase deficits during oral management of these consistencies. Clear vocal quality was noted b/t trials; no decline in respiratory status during/post trials(O2 sats remained in upper 90s). Pt fed self w/ setup A. Rec. a pureed diet texture at this time sec. to recent dental surgery/extraction w/ trials to upgrade food consistency as pt tolerates during admission, or into next week. Rec. thin liquids w/ aspiration precautions including no straws if not appropriate post dental extraction; meds in puree as nec. Education given to Thomas Townsend/pt re: diet consistency and food options/prep. Rec'd oral rinsing post meal to aid oral care(possibly even salt water rinses per MD ok). NSG updated. Pt agreed.   HPI HPI: Pt is a 81 y.o. male with a known history of CHF, GERD, COPD non-option requiring, atrial fibrillation on warfarin who is presenting with altered mental status. Patient unable to provide meaningful information given mental status/medical condition. History obtained from daughter who is present at bedside. She describes these been recently having issues with dentition to the point of actually having a procedure performed approximately one week ago. He is now presenting with altered mental status described as confusion with associated fever, cough, shortness of breath. Apparently cough is nonproductive shortness of breath and cough are both essentially at baseline per the daughter. Pt recently had dental surgery in which he had 9-11 teeth removed. Pt  indicated his gums are still "sore". Pt has been taking in less oral intake in the past week since the sugery which may have contributed to his declined medical status requiring hospitalization. Pt and family indicate is eating/drinking less. Pt is verbally conversive; alert and oriented. He reported he was drinking and eating w/out difficulty - pureed diet. Pt appeared much stronger and more conversive.      SLP Plan  Continue with current plan of care     Recommendations  Diet recommendations: Dysphagia 1 (puree);Thin liquid (w/ trials to upgrade next 1-2 days) Liquids provided via: Cup Medication Administration: Whole meds with liquid (w/ puree if easier to swallow) Supervision: Patient able to self feed (setup A) Compensations: Minimize environmental distractions;Slow rate;Small sips/bites;Follow solids with liquid Postural Changes and/or Swallow Maneuvers: Seated upright 90 degrees              General recommendations:  (dietary supplements if indicated; no skilled ST at d/c) Oral Care Recommendations: Oral care QID;Patient independent with oral care (w/ support) Follow up Recommendations: None Plan: Continue with current plan of care    Thomas Townsend, [place], CCC-SLP  Shaunie Boehm 08/08/2015, 2:33 PM

## 2015-08-08 NOTE — Plan of Care (Signed)
Problem: SLP Dysphagia Goals Goal: Misc Dysphagia Goal Pt will safely tolerate po diet of least restrictive consistency w/ no overt s/s of aspiration noted by Staff/pt/family x3 sessions. Pt will safely tolerate po diet of least restrictive consistency w/ no overt s/s of aspiration noted by Staff/pt/family x3 sessions.

## 2015-08-08 NOTE — Progress Notes (Signed)
ANTIBIOTIC CONSULT NOTE - INITIAL  Pharmacy Consult for vancomycin/Zosyn Indication: rule out pneumonia  No Known Allergies  Patient Measurements: Height: 5\' 7"  (170.2 cm) Weight: 147 lb 4.3 oz (66.8 kg) IBW/kg (Calculated) : 66.1 Adjusted Body Weight:   Vital Signs: Temp: 98.8 F (37.1 C) (11/11 0800) Temp Source: Oral (11/11 0800) BP: 118/79 mmHg (11/11 1000) Pulse Rate: 89 (11/11 0800) Intake/Output from previous day: 11/10 0701 - 11/11 0700 In: 2626 [P.O.:476; I.V.:1800; IV Piggyback:350] Out: 2401 [Urine:2400; Stool:1] Intake/Output from this shift: Total I/O In: 599.3 [P.O.:348; I.V.:251.3] Out: 1975 [id number]  Labs:  Recent Labs  08/06/15 0849 08/06/15 1342 08/07/15 0423 08/08/15 0440  WBC 56.5* 49.1* 37.1* 28.6*  HGB 7.2* 6.7* 7.8* 7.7*  PLT 210 188 181 192  CREATININE 1.16  --  0.94 0.87   Estimated Creatinine Clearance: 62.3 mL/min (by C-G formula based on Cr of 0.87). No results for input(s): VANCOTROUGH, VANCOPEAK, VANCORANDOM, GENTTROUGH, GENTPEAK, GENTRANDOM, TOBRATROUGH, TOBRAPEAK, TOBRARND, AMIKACINPEAK, AMIKACINTROU, AMIKACIN in the last 72 hours.   Microbiology: Recent Results (from the past 720 hour(s))  Blood Culture (routine x 2)     Status: None (Preliminary result)   Collection Time: 08/06/15  1:34 AM  Result Value Ref Range Status   Specimen Description BLOOD BLOOD RIGHT FOREARM  Final   Special Requests BOTTLES DRAWN AEROBIC AND ANAEROBIC 5ML  Final   Culture NO GROWTH 2 DAYS  Final   Report Status PENDING  Incomplete  Blood Culture (routine x 2)     Status: None (Preliminary result)   Collection Time: 08/06/15  1:34 AM  Result Value Ref Range Status   Specimen Description BLOOD RIGHT HAND  Final   Special Requests BOTTLES DRAWN AEROBIC AND ANAEROBIC 5ML  Final   Culture NO GROWTH 2 DAYS  Final   Report Status PENDING  Incomplete  Urine culture     Status: None   Collection Time: 08/06/15  1:34 AM  Result Value Ref Range Status    Specimen Description URINE, RANDOM  Final   Special Requests NONE  Final   Culture NO GROWTH 1 DAY  Final   Report Status 08/07/2015 FINAL  Final  MRSA PCR Screening     Status: None   Collection Time: 08/06/15  8:43 AM  Result Value Ref Range Status   MRSA by PCR NEGATIVE NEGATIVE Final    Comment:        The GeneXpert MRSA Assay (FDA approved for NASAL specimens only), is one component of a comprehensive MRSA colonization surveillance program. It is not intended to diagnose MRSA infection nor to guide or monitor treatment for MRSA infections.   Culture, expectorated sputum-assessment     Status: None   Collection Time: 08/07/15 12:26 PM  Result Value Ref Range Status   Specimen Description EXPECTORATED SPUTUM  Final   Special Requests NONE  Final   Sputum evaluation THIS SPECIMEN IS ACCEPTABLE FOR SPUTUM CULTURE  Final   Report Status 08/07/2015 FINAL  Final  Culture, respiratory (NON-Expectorated)     Status: None (Preliminary result)   Collection Time: 08/07/15 12:26 PM  Result Value Ref Range Status   Specimen Description EXPECTORATED SPUTUM  Final   Special Requests NONE Reflexed from [id number]  Final   Gram Stain PENDING  Incomplete   Culture NO GROWTH < 24 HOURS  Final   Report Status PENDING  Incomplete    Medical History: Past Medical History  Diagnosis Date  . COPD (chronic obstructive pulmonary disease) ([place])   .  Atrial fibrillation ([place])   . Prostate cancer ([place])   . GERD (gastroesophageal reflux disease)   . CHF (congestive heart failure) ([place])   . Dysrhythmia   . Arthritis   . Hypertension   . Hyperlipemia     Medications:   Assessment: 83 yom cc AMX, hx AF, COPD, cancer, aplastic anemia, recent dental extraction. Starting broad spectrum abx for fever, hypoxia, cough, and CXR showing right basilar pneumonia. Vancomycin d/c due to negative MRSA PCR and blood cultures negative for 48 hours.   Goal of Therapy:  Resolution of Infection  Plan:   Expected duration 7 days with resolution of temperature and/or normalization of WBC. Continue Zosyn 3.375 gm Q8H EI. Will continue to follow renal function and culture results.   Ulice Dash D, Pharm.D.  Clinical Pharmacist 08/08/2015,12:24 PM

## 2015-08-08 NOTE — Care Management Note (Signed)
Case Management Note  Patient Details  Name: Thomas Townsend MRN: [id number] Date of Birth: 18-Aug-1931  Subjective/Objective:  Met with patient at bedside. Patient is alert and oriented. 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 PCP is Dr. Humphrey Rolls. He gets his medications at [facility]. Denies financial issues and is able to access medical care. Patient states he does not want to be placed any where or have home health.                 Action/Plan: Requested PT consult.   Expected Discharge Date:                  Expected Discharge Plan:     In-House Referral:     Discharge planning Services     Post Acute Care Choice:    Choice offered to:     DME Arranged:    DME Agency:     HH Arranged:    [place] Agency:     Status of Service:     Medicare Important Message Given:    Date Medicare IM Given:    Medicare IM give by:    Date Additional Medicare IM Given:    Additional Medicare Important Message give by:     If discussed at [place] of Stay Meetings, dates discussed:    Additional Comments:  Jolly Mango, RN 08/08/2015, 11:55 AM

## 2015-08-08 NOTE — Progress Notes (Signed)
*  PRELIMINARY RESULTS* Echocardiogram 2D Echocardiogram has been performed.  Thomas Townsend 08/08/2015, 4:35 PM

## 2015-08-08 NOTE — Progress Notes (Signed)
[place] [facility] [place] NAME: Thomas Townsend    MR#:  [id number]  DATE OF BIRTH:  09-Apr-1931  SUBJECTIVE:  CHIEF COMPLAINT:   Chief Complaint  Patient presents with  . Altered Mental Status   the patient is alert, awake but lethargic.  He has no complaints. Off Levophed drip. On O2 [place] 3L.  REVIEW OF SYSTEMS:  CONSTITUTIONAL: No fever, has generalized weakness.  EYES: No blurred or double vision.  EARS, NOSE, AND THROAT: No tinnitus or ear pain.  RESPIRATORY: No cough, shortness of breath, wheezing or hemoptysis.  CARDIOVASCULAR: No chest pain, orthopnea, edema.  GASTROINTESTINAL: No nausea, vomiting, diarrhea or abdominal pain.  GENITOURINARY: No dysuria, hematuria.  ENDOCRINE: No polyuria, nocturia,  HEMATOLOGY: No anemia, easy bruising or bleeding SKIN: No rash or lesion. MUSCULOSKELETAL: No joint pain or arthritis.   NEUROLOGIC: No tingling, numbness, weakness.  PSYCHIATRY: No anxiety or depression.   DRUG ALLERGIES:  No Known Allergies  VITALS:  Blood pressure 113/67, pulse 68, temperature 98.8 F (37.1 C), temperature source Oral, resp. rate 25, height 5\' 7"  (1.702 m), weight 66.8 kg (147 lb 4.3 oz), SpO2 100 %.  PHYSICAL EXAMINATION:  GENERAL:  81 y.o.-year-old patient lying in the bed with no acute distress.  EYES: Pupils equal, round, reactive to light and accommodation. No scleral icterus. Extraocular muscles intact.  HEENT: Head atraumatic, normocephalic. Oropharynx and nasopharynx clear.  NECK:  Supple, no jugular venous distention. No thyroid enlargement, no tenderness.  LUNGS: Coarse breath sounds bilaterally, mild wheezing and right-sided crackles. No use of accessory muscles of respiration.  CARDIOVASCULAR: S1, S2 normal. No murmurs, rubs, or gallops.  ABDOMEN: Soft, nontender, nondistended. Bowel sounds present. No organomegaly or mass.  EXTREMITIES: No pedal edema, cyanosis, or clubbing.  NEUROLOGIC: Cranial  nerves II through XII are intact. Muscle strength 4/5 in all extremities. Sensation intact. Gait not checked.  PSYCHIATRIC: The patient is alert but lethargic.  SKIN: No obvious rash, lesion, or ulcer.    LABORATORY PANEL:   CBC  Recent Labs Lab 08/08/15 0440  WBC 28.6*  HGB 7.7*  HCT 24.2*  PLT 192   ------------------------------------------------------------------------------------------------------------------  Chemistries   Recent Labs Lab 08/06/15 0135  08/08/15 0440  NA 139  < > 138  K 4.0  < > 3.8  CL 102  < > 108  CO2 24  < > 24  GLUCOSE 140*  < > 97  BUN 26*  < > 13  CREATININE 1.22  < > 0.87  CALCIUM 8.6*  < > 7.9*  MG  --   --  2.1  AST 25  --   --   ALT 13*  --   --   ALKPHOS 71  --   --   BILITOT 0.5  --   --   < > = values in this interval not displayed. ------------------------------------------------------------------------------------------------------------------  Cardiac Enzymes No results for input(s): TROPONINI in the last 168 hours. ------------------------------------------------------------------------------------------------------------------  RADIOLOGY:  No results found.  EKG:   Orders placed or performed during the hospital encounter of 08/06/15  . ED EKG 12-Lead  . ED EKG 12-Lead    ASSESSMENT AND PLAN:   1. Septic shock with pneumonia. Septic shock improved. off Levophed drip. Continue vancomycin and Zosyn, follow-up panculture and the CBC. Continue IV fluid support. WBC is down to 28.6. Blood culture so far is negative.  2. Atrial fibrillation rapid ventricular response:  Discontinue IV fluid hydration, continue digoxin and warfarin,  follow-up INR. No Cardizem or Lopressor due to low BP. INR is supratherapeutic.  3. COPD: Continue oxygen as stated above, DuoNeb treatments, home medications  4. History of CHF. Unclear type. Stable. Watch for fluid overload. We will get echocardiogram.  * acute respiratory failure with  hypoxia.  Continue O2 [place] and NEB. Lasix 1 dose.  *Anemia of chronic disease. No active bleeding. Hemoglobin decreased to 6.7 and up to 7.7 after PRBC transfusion 1 units,  follow-up CBC.  * Acute metabolic encephalopathy. Improved. Aspiration precaution.  * Lactic Acidosis. Improved  Transfer to medical floor with telemetry monitor. Discontinue Foley and Start physical therapy evaluation.  All the records are reviewed and case discussed with Care Management/Social Workerr. Management plans discussed with the patient, his duaghter and they are in agreement. Greater than 50% time was spent on coordination of care and face-to-face counseling.  CODE STATUS: Full code  TOTAL CRITICAL TIME TAKING CARE OF THIS PATIENT: 41 minutes.   POSSIBLE D/C IN 3 DAYS, DEPENDING ON CLINICAL CONDITION.   Demetrios Loll M.D on 08/08/2015 at 1:39 PM  Between 7am to 6pm - Pager - [phone]  After 6pm go to [email or web address] - password [email or web address] [facility]  Office  [phone]  CC: Primary care physician; Allyne Gee, MD

## 2015-08-08 NOTE — Progress Notes (Signed)
Called floor to give report and nurse is currently at lunch. They said she would call back. Will continue to assess and monitor.

## 2015-08-08 NOTE — Progress Notes (Signed)
Nutrition Follow-up   INTERVENTION:   Meals and Snacks: Cater to patient preferences Medical Food Supplement Therapy: continue Ensure Enlive po BID, each supplement provides 350 kcal and 20 grams of protein  NUTRITION DIAGNOSIS:   Biting/chewing difficulty related to altered GI function as evidenced by  (recent dental extraction).  GOAL:   Patient will meet greater than or equal to 90% of their needs  MONITOR:    (Energy Intake, Anthropometrics, Digestive System, Electrolyte/Renal Profile)  REASON FOR ASSESSMENT:   Consult Poor PO  ASSESSMENT:    Pt admitted with septic shock with pneumonia, afib with RVR; pt with recent dental extraction  Past Medical History  Diagnosis Date  . COPD (chronic obstructive pulmonary disease) ([place])   . Atrial fibrillation ([place])   . Prostate cancer ([place])   . GERD (gastroesophageal reflux disease)   . CHF (congestive heart failure) ([place])   . Dysrhythmia   . Arthritis   . Hypertension   . Hyperlipemia      Diet Order:  DIET - DYS 1 Room service appropriate?: Yes; Fluid consistency:: Thin; SLP following  Energy Intake: recorded po intake 48% on average, drinking some Ensure. Reports appetite good  Food and Nutrition Related History: pt reports good appetite at thome; reports he drinks 1 Ensure daily. Noted pt with recent dental extraction 9-11 teeth; pt needing softer foods, reports no issues chewing current puree diet  Electrolyte and Renal Profile:  Recent Labs Lab 08/06/15 0849 08/07/15 0423 08/08/15 0440  BUN 21* 15 13  CREATININE 1.16 0.94 0.87  NA 138 139 138  K 3.5 4.1 3.8  MG  --   --  2.1   Glucose Profile: No results for input(s): GLUCAP in the last 72 hours.   Meds: lasix, prednisone  Height:   Ht Readings from Last 1 Encounters:  08/07/15 5\' 7"  (1.702 m)    Weight: pt reports he does not know how much he weighs normally but thinks weight has been stable, clothes still fit the same.   Wt Readings from Last  1 Encounters:  08/07/15 147 lb 4.3 oz (66.8 kg)    Wt Readings from Last 10 Encounters:  08/07/15 147 lb 4.3 oz (66.8 kg)  05/27/15 152 lb 5.4 oz (69.1 kg)  03/26/15 149 lb 6 oz (67.756 kg)  01/28/15 148 lb 2.4 oz (67.2 kg)  03/20/14 140 lb 6.9 oz (63.699 kg)    BMI:  Body mass index is 23.06 kg/(m^2).  LOW Care Level  Kerman Passey MS, [place], LDN [phone] Pager

## 2015-08-08 NOTE — Progress Notes (Signed)
ANTICOAGULATION CONSULT NOTE - Initial Consult  Pharmacy Consult for Coumadin Indication: atrial fibrillation  No Known Allergies  Patient Measurements: Height: 5\' 7"  (170.2 cm) Weight: 147 lb 4.3 oz (66.8 kg) IBW/kg (Calculated) : 66.1   Vital Signs: Temp: 98.7 F (37.1 C) (11/11 0300) Temp Source: Axillary (11/11 0300) BP: 122/86 mmHg (11/11 0600) Pulse Rate: 63 (11/11 0500)  Labs:  Recent Labs  08/06/15 0849 08/06/15 0935 08/06/15 1342 08/07/15 0423 08/08/15 0440  HGB 7.2*  --  6.7* 7.8* 7.7*  HCT 23.6*  --  21.4* 24.6* 24.2*  PLT 210  --  188 181 192  LABPROT  --  24.6*  --  28.6* 26.3*  INR  --  2.24  --  2.74 2.45  CREATININE 1.16  --   --  0.94 0.87    Estimated Creatinine Clearance: 62.3 mL/min (by C-G formula based on Cr of 0.87).   Medical History: Past Medical History  Diagnosis Date  . COPD (chronic obstructive pulmonary disease) ([place])   . Atrial fibrillation ([place])   . Prostate cancer ([place])   . GERD (gastroesophageal reflux disease)   . CHF (congestive heart failure) ([place])   . Dysrhythmia   . Arthritis   . Hypertension   . Hyperlipemia     Medications:  Scheduled:  . sodium chloride   Intravenous Once  . arformoterol  15 mcg Nebulization BID  . budesonide (PULMICORT) nebulizer solution  0.25 mg Nebulization BID  . digoxin  0.125 mg Oral Daily  . feeding supplement (ENSURE ENLIVE)  237 mL Oral BID BM  . ferrous sulfate  325 mg Oral q morning - 123XX123  . folic acid  1 mg Oral Daily  . ipratropium-albuterol  3 mL Nebulization Q6H  . montelukast  10 mg Oral Daily  . piperacillin-tazobactam (ZOSYN)  IV  3.375 g Intravenous Q8H  . predniSONE  5 mg Oral Q breakfast  . sodium chloride  3 mL Intravenous Q12H  . vancomycin  1,000 mg Intravenous Q18H  . warfarin  4 mg Oral Daily  . Warfarin - Pharmacist Dosing Inpatient   Does not apply q1800   Infusions:  . sodium chloride 75 mL/hr at 08/07/15 2259  . norepinephrine 0 mcg/min (08/07/15 0700)     Assessment: 81 y/o M admitted with sepsis on Coumadin 4 mg daily PTA. INR Is therapeutic, however patient is on antibiotics which may increase INR.   Goal of Therapy:  INR 2-3   Plan:  INR remains therapeutic, however gave lower dose yesterday as INR increasing significantly and patient on antibiotics. Will continue from today with outpatient dosing of Coumadin 4 mg daily. Will f/u AM INR and may use less frequent monitoring after tomorrow if INR remains therapeutic.   Ulice Dash D 08/08/2015,7:20 AM

## 2015-08-09 LAB — CULTURE, RESPIRATORY: CULTURE: NORMAL

## 2015-08-09 LAB — CBC WITH DIFFERENTIAL/PLATELET
BASOS ABS: 0 10*3/uL (ref 0–0.1)
Basophils Relative: 0 %
Eosinophils Absolute: 0.2 10*3/uL (ref 0–0.7)
Eosinophils Relative: 1 %
HEMATOCRIT: 24.3 % — AB (ref 40.0–52.0)
Hemoglobin: 7.8 g/dL — ABNORMAL LOW (ref 13.0–18.0)
LYMPHS ABS: 1.3 10*3/uL (ref 1.0–3.6)
LYMPHS PCT: 6 %
MCH: 31.2 pg (ref 26.0–34.0)
MCHC: 32.1 g/dL (ref 32.0–36.0)
MCV: 97.2 fL (ref 80.0–100.0)
MONOS PCT: 10 %
Monocytes Absolute: 2.1 10*3/uL — ABNORMAL HIGH (ref 0.2–1.0)
Neutro Abs: 17.5 10*3/uL — ABNORMAL HIGH (ref 1.4–6.5)
Neutrophils Relative %: 83 %
Platelets: 201 10*3/uL (ref 150–440)
RBC: 2.51 MIL/uL — AB (ref 4.40–5.90)
RDW: 26 % — AB (ref 11.5–14.5)
WBC: 21.1 10*3/uL — AB (ref 3.8–10.6)

## 2015-08-09 LAB — CULTURE, RESPIRATORY W GRAM STAIN

## 2015-08-09 LAB — PREPARE RBC (CROSSMATCH)

## 2015-08-09 LAB — PROTIME-INR
INR: 2.19
PROTHROMBIN TIME: 24.2 s — AB (ref 11.4–15.0)

## 2015-08-09 MED ORDER — SALINE SPRAY 0.65 % NA SOLN
1.0000 | NASAL | Status: DC | PRN
  Filled 2015-08-09: qty 44

## 2015-08-09 MED ORDER — WARFARIN SODIUM 5 MG PO TABS
5.0000 mg | ORAL_TABLET | Freq: Once | ORAL | Status: AC
  Administered 2015-08-09: 18:00:00 5 mg via ORAL
  Filled 2015-08-09: qty 1

## 2015-08-09 MED ORDER — WARFARIN SODIUM 4 MG PO TABS
4.0000 mg | ORAL_TABLET | Freq: Every day | ORAL | Status: DC
  Administered 2015-08-10: 18:00:00 4 mg via ORAL
  Filled 2015-08-09: qty 1

## 2015-08-09 MED ORDER — SODIUM CHLORIDE 0.9 % IV SOLN: Freq: Once | INTRAVENOUS | Status: DC

## 2015-08-09 MED ORDER — SODIUM CHLORIDE 0.9 % IV SOLN
INTRAVENOUS | Status: DC
  Administered 2015-08-09 – 2015-08-10 (×2): via INTRAVENOUS

## 2015-08-09 MED ORDER — SODIUM CHLORIDE 0.9 % IV SOLN
INTRAVENOUS | Status: DC
  Administered 2015-08-09: 16:00:00 via INTRAVENOUS

## 2015-08-09 NOTE — Evaluation (Signed)
Physical Therapy Evaluation Patient Details Name: Thomas Townsend MRN: [id number] DOB: [birth year] Today's Date: 08/09/2015   History of Present Illness  Pt is a 81 yo male who was admitted to the hospital for SOB and altered mental status. Pt found to have pneumonia.  Clinical Impression  Pt presents with hx of COPD, A-fib, prostate cancer, GERD, CHF, and HTN. Examination reveals that pt performs bed mobility and transfers at mod I, and ambulation of 250 ft at [facility]. Pt has minor balance issues when walking without RW, but this corrected with RW. Pt performed ambulation on room air with NAD noted and fair SaO2 response (reference ambulation section). Due to his gait and activity tolerance deficits, he will continue to benefit from skilled PT in order to return to optimal PLOF. Pt should be safe at home with RW, however he is still somewhat removed from his functional baseline.     Follow Up Recommendations Outpatient PT;Supervision - Intermittent    Equipment Recommendations  Rolling walker with 5" wheels    Recommendations for Other Services       Precautions / Restrictions Precautions Precautions: Fall Restrictions Weight Bearing Restrictions: No      Mobility  Bed Mobility Overal bed mobility: Modified Independent             General bed mobility comments: Pt able to perform bed mobility with good control and use of hands on side rails.   Transfers Overall transfer level: Modified independent Equipment used: Rolling walker (2 wheeled) Transfers: Sit to/from Stand Sit to Stand: Supervision         General transfer comment: Pt performs transfers with cues for hand placement, demonstrates good functional strength and stability with transfers.   Ambulation/Gait Ambulation/Gait assistance: Min guard Ambulation Distance (Feet): 250 Feet Assistive device: Rolling walker (2 wheeled);None Gait Pattern/deviations: Staggering left;Staggering right Gait velocity:  decreased Gait velocity interpretation: Below normal speed for age/gender General Gait Details: Pt performs ambulation with and without RW. Pt ambulating without RW shows good endurance but has minor staggering bilaterally that is self-correctable. He has no overt LOB without RW. With RW any staggering disappears. Pt performed all ambulation on room air and SaO2 hovered between 88 and 90%. Following ambualtion pt's O2 sat was at 92% resting. Pt was left off of O2 [place]; nurse notified and in agreement   Stairs            Wheelchair Mobility    Modified Rankin (Stroke Patients Only)       Balance Overall balance assessment:  (minor balance issues with ambulation; stable in standing)                                           Pertinent Vitals/Pain Pain Assessment: No/denies pain    Home Living Family/patient expects to be discharged to:: Private residence Living Arrangements:  (Lives with son in-law) Available Help at Discharge: Family Type of Home: House Home Access: Stairs to enter Entrance Stairs-Rails: Can reach both Entrance Stairs-Number of Steps: 2 Home Layout: One level Home Equipment: None      Prior Function Level of Independence: Independent         Comments: Pt indep with ambulation, ADLs, and still driving. Ambulating community distances     Hand Dominance        Extremity/Trunk Assessment   Upper Extremity Assessment: Overall WFL for tasks assessed  Lower Extremity Assessment: Overall WFL for tasks assessed         Communication   Communication: No difficulties  Cognition Arousal/Alertness: Awake/alert Behavior During Therapy: WFL for tasks assessed/performed Overall Cognitive Status: Within Functional Limits for tasks assessed                      General Comments      Exercises Other Exercises Other Exercises: Gait training      Assessment/Plan    PT Assessment Patient needs continued PT  services  PT Diagnosis Abnormality of gait (cardiopulmonary deficits )   PT Problem List Decreased activity tolerance;Cardiopulmonary status limiting activity;Decreased balance  PT Treatment Interventions DME instruction;Gait training;Stair training;Functional mobility training;Therapeutic activities;Therapeutic exercise;Balance training;Neuromuscular re-education   PT Goals (Current goals can be found in the Care Plan section) Acute Rehab PT Goals Patient Stated Goal: to return home PT Goal Formulation: With patient Time For Goal Achievement: 08/23/15 Potential to Achieve Goals: Good    Frequency Min 2X/week   Barriers to discharge        Co-evaluation               End of Session Equipment Utilized During Treatment: Gait belt Activity Tolerance: Patient tolerated treatment well Patient left: in bed;with call bell/phone within reach;with bed alarm set Nurse Communication: Mobility status (SaO2 response)         Time: [id number] PT Time Calculation (min) (ACUTE ONLY): 20 min   Charges:         PT G CodesJanyth Contes 08-10-15, 10:26 AM  Janyth Contes, SPT. [phone]

## 2015-08-09 NOTE — Progress Notes (Signed)
Tx provided at meal. Trial of Dysphagia 2 diet in hopes of upgrade. Pt tolerated an egg salad sandwhich with no s/s of aspiration and min oral difficulty. Pt reported he enjoyed the egg salad very much but it was a bit too tough and he preferred to continue with the blended foods. Will continue with Dys 1 diet for now. SLP to f/u 2-4 days.

## 2015-08-09 NOTE — Progress Notes (Signed)
[facility] [place] NAME: Thomas Townsend    MR#:  [id number]  DATE OF BIRTH:  05-31-1931  SUBJECTIVE:  CHIEF COMPLAINT:   Chief Complaint  Patient presents with  . Altered Mental Status   the patient is alert, awake but lethargic.  He has no complaints. Off Levophed drip. On O2 [place] 2L, she is at the higher baseline oxygenation. Feels weak, somewhat nausea  REVIEW OF SYSTEMS:  CONSTITUTIONAL: No fever, has generalized weakness.  EYES: No blurred or double vision.  EARS, NOSE, AND THROAT: No tinnitus or ear pain.  RESPIRATORY: No cough, shortness of breath, wheezing or hemoptysis.  CARDIOVASCULAR: No chest pain, orthopnea, edema.  GASTROINTESTINAL: No nausea, vomiting, diarrhea or abdominal pain.  GENITOURINARY: No dysuria, hematuria.  ENDOCRINE: No polyuria, nocturia,  HEMATOLOGY: No anemia, easy bruising or bleeding SKIN: No rash or lesion. MUSCULOSKELETAL: No joint pain or arthritis.   NEUROLOGIC: No tingling, numbness, weakness.  PSYCHIATRY: No anxiety or depression.   DRUG ALLERGIES:  No Known Allergies  VITALS:  Blood pressure 89/62, pulse 117, temperature 98.2 F (36.8 C), temperature source Oral, resp. rate 20, height 5\' 7"  (1.702 m), weight 67.132 kg (148 lb), SpO2 92 %.  PHYSICAL EXAMINATION:  GENERAL:  81 y.o.-year-old patient lying in the bed with no acute distress.  EYES: Pupils equal, round, reactive to light and accommodation. No scleral icterus. Extraocular muscles intact.  HEENT: Head atraumatic, normocephalic. Oropharynx and nasopharynx clear.  NECK:  Supple, no jugular venous distention. No thyroid enlargement, no tenderness.  LUNGS: Coarse breath sounds bilaterally, mild wheezing and right-sided crackles. No use of accessory muscles of respiration.  CARDIOVASCULAR: S1, S2 normal. No murmurs, rubs, or gallops.  ABDOMEN: Soft, nontender, nondistended. Bowel sounds present. No organomegaly or mass.   EXTREMITIES: No pedal edema, cyanosis, or clubbing.  NEUROLOGIC: Cranial nerves II through XII are intact. Muscle strength 4/5 in all extremities. Sensation intact. Gait not checked.  PSYCHIATRIC: The patient is alert but lethargic.  SKIN: No obvious rash, lesion, or ulcer.    LABORATORY PANEL:   CBC  Recent Labs Lab 08/09/15 0419  WBC 21.1*  HGB 7.8*  HCT 24.3*  PLT 201   ------------------------------------------------------------------------------------------------------------------  Chemistries   Recent Labs Lab 08/06/15 0135  08/08/15 0440  NA 139  < > 138  K 4.0  < > 3.8  CL 102  < > 108  CO2 24  < > 24  GLUCOSE 140*  < > 97  BUN 26*  < > 13  CREATININE 1.22  < > 0.87  CALCIUM 8.6*  < > 7.9*  MG  --   --  2.1  AST 25  --   --   ALT 13*  --   --   ALKPHOS 71  --   --   BILITOT 0.5  --   --   < > = values in this interval not displayed. ------------------------------------------------------------------------------------------------------------------  Cardiac Enzymes No results for input(s): TROPONINI in the last 168 hours. ------------------------------------------------------------------------------------------------------------------  RADIOLOGY:  No results found.  EKG:   Orders placed or performed during the hospital encounter of 08/06/15  . ED EKG 12-Lead  . ED EKG 12-Lead    ASSESSMENT AND PLAN:   1. Septic shock with pneumonia. Septic shock , resolved. off Levophed drip. Continue vancomycin and Zosyn, follow-up panculture and the CBC. Continue IV fluid support. WBC is down to 21.1 thousand. Blood culture so far is negative. Sputum culture setup pending, although  gram-positive cocci are noted .   2. Atrial fibrillation rapid ventricular response:  Of IV fluid hydration, continue digoxin and warfarin, follow-up INR. No Cardizem or Lopressor due to low BP. INR is therapeutic.  3. COPD: Continue oxygen as stated above, DuoNeb treatments, home  medications  4. History of CHF. Unclear type. Stable. Watch for fluid overload. echocardiogram. Revealed normal ejection fraction, mild LVH, mild aortic stenosis and moderately elevated pulmonary arterial pressures of 55 mmHg. Supportive therapy at present   * acute respiratory failure with hypoxia.  Continue O2 Elias-Fela Solis and NEB. Lasix 1 dose yesterday, patient blood pressure readings are low today, concerning for intravascular depletion.  *Anemia of chronic disease. No active bleeding. Hemoglobin decreased to 6.7 and up to 7.8 after PRBC transfusion 1 units,  follow-up CBC in the morning. We'll get one more unit of packed red blood cells transfused due to significant hypotension, hypoxia, infection, altered mental status  * Acute metabolic encephalopathy. Improved. Aspiration precaution.  * Lactic Acidosis. Improved  Transfer to medical floor with telemetry monitor. Discontinue Foley and Start physical therapy evaluation.  All the records are reviewed and case discussed with Care Management/Social Workerr. Management plans discussed with the patient, his duaghter and they are in agreement. Greater than 50% time was spent on coordination of care and face-to-face counseling.  CODE STATUS: Full code  TOTAL CRITICAL TIME TAKING CARE OF THIS PATIENT: 40 minutes.   POSSIBLE D/C IN 3 DAYS, DEPENDING ON CLINICAL CONDITION.   Theodoro Grist M.D on 08/09/2015 at 2:50 PM  Between 7am to 6pm - Pager - [phone]  After 6pm go to [email or web address] - password [email or web address] [facility]  Office  [phone]  CC: Primary care physician; Allyne Gee, MD

## 2015-08-09 NOTE — Plan of Care (Signed)
Problem: Education: Goal: Knowledge of [place] [facility] Education information/materials will improve Outcome: Progressing Discussed the plan of care with pt and family. Pt/family verbalized understanding. Discussed with pt about high fall risk precautions, pt verbalized understanding and follows precautions.   Problem: Safety: Goal: Ability to remain free from injury will improve Outcome: Progressing High fall risk precautions maintained. Pt remained free of injury.  Problem: Health Behavior/Discharge Planning: Goal: Ability to manage health-related needs will improve Outcome: Progressing Pt is compliant with plan of care, treatment and medications.  Problem: Pain Managment: Goal: General experience of comfort will improve Outcome: Completed/Met Date Met:  08/09/15 Pt denies pain throughout the shift.  Problem: Physical Regulation: Goal: Ability to maintain clinical measurements within normal limits will improve Outcome: Progressing A fib on the monitor, Digoxin continues,warfarin given. Pt ambulated in the hall today O2 sats dropped to 88% on RA. Sats up to 92% once back to bed. During VS checks O2 sats 89% on RA, BP 89/62. 1L O2 applied per [place], O2 sats up to 92%, MD ordered IVF and 1xunit of blood to administered. Blood initiated.  Problem: Skin Integrity: Goal: Risk for impaired skin integrity will decrease Outcome: Progressing Feeding encouraged. Pt is ambulatory and able to turn self. Heels elevated. Skin intact.   Problem: Activity: Goal: Risk for activity intolerance will decrease Outcome: Progressing Pt ambulate with standby assist. O2 sats dropped to 88% during ambulation in the hall.Currently on 1L O2 per [place] with O2 sats 94%.  Problem: Fluid Volume: Goal: Ability to maintain a balanced intake and output will improve Outcome: Progressing Pt voids without difficulty. Fluids and meals encouraged. Blood initiated. Currently BP stable.  Problem: Nutrition: Goal:  Adequate nutrition will be maintained Outcome: Progressing Fair appetite. Encouraged feeding.  Problem: Bowel/Gastric: Goal: Will not experience complications related to bowel motility Outcome: Progressing Last BM on 08/07/2015. Will continue to monitor.

## 2015-08-09 NOTE — Progress Notes (Signed)
ANTICOAGULATION CONSULT NOTE - Initial Consult  Pharmacy Consult for Coumadin Indication: atrial fibrillation  No Known Allergies  Patient Measurements: Height: 5\' 7"  (170.2 cm) Weight: 148 lb (67.132 kg) IBW/kg (Calculated) : 66.1   Vital Signs: Temp: 98.1 F (36.7 C) (11/12 0522) Temp Source: Oral (11/12 0522) BP: 111/55 mmHg (11/12 0522) Pulse Rate: 95 (11/12 0522)  Labs:  Recent Labs  08/07/15 0423 08/08/15 0440 08/09/15 0419  HGB 7.8* 7.7* 7.8*  HCT 24.6* 24.2* 24.3*  PLT 181 192 201  LABPROT 28.6* 26.3* 24.2*  INR 2.74 2.45 2.19  CREATININE 0.94 0.87  --     Estimated Creatinine Clearance: 62.3 mL/min (by C-G formula based on Cr of 0.87).   Medical History: Past Medical History  Diagnosis Date  . COPD (chronic obstructive pulmonary disease) ([place])   . Atrial fibrillation ([place])   . Prostate cancer ([place])   . GERD (gastroesophageal reflux disease)   . CHF (congestive heart failure) ([place])   . Dysrhythmia   . Arthritis   . Hypertension   . Hyperlipemia     Medications:  Scheduled:  . arformoterol  15 mcg Nebulization BID  . budesonide (PULMICORT) nebulizer solution  0.25 mg Nebulization BID  . digoxin  0.125 mg Oral Daily  . feeding supplement (ENSURE ENLIVE)  237 mL Oral BID BM  . ferrous sulfate  325 mg Oral q morning - 123XX123  . folic acid  1 mg Oral Daily  . montelukast  10 mg Oral Daily  . piperacillin-tazobactam (ZOSYN)  IV  3.375 g Intravenous Q8H  . predniSONE  5 mg Oral Q breakfast  . sodium chloride  3 mL Intravenous Q12H  . warfarin  4 mg Oral Daily  . Warfarin - Pharmacist Dosing Inpatient   Does not apply q1800   Infusions:     Assessment: 80 y/o M admitted with sepsis on Coumadin 4 mg daily PTA. INR Is therapeutic, however patient is on antibiotics which may increase INR.   Goal of Therapy:  INR 2-3   Plan:  INR remains therapeutic, however  lower dose was given on 11/10 as INR increasing significantly and patient on antibiotics.  Patient received home dose of warfarin 4 mg PO on 11/11.   Will give 5 mg PO x 1 today due to INR trending downward and will continue with 4 mg PO q1800 pm on 11/13 unless INR results warrant change of strategy.  Tamre Cass D 08/09/2015,9:16 AM

## 2015-08-10 ENCOUNTER — Inpatient Hospital Stay: Payer: Medicare Other

## 2015-08-10 LAB — TYPE AND SCREEN
ABO/RH(D): O NEG
ANTIBODY SCREEN: NEGATIVE
UNIT DIVISION: 0
UNIT DIVISION: 0

## 2015-08-10 LAB — CBC WITH DIFFERENTIAL/PLATELET
BASOS ABS: 0 10*3/uL (ref 0–0.1)
Basophils Relative: 0 %
EOS ABS: 0.6 10*3/uL (ref 0–0.7)
Eosinophils Relative: 2 %
HCT: 31.5 % — ABNORMAL LOW (ref 40.0–52.0)
HEMOGLOBIN: 10.4 g/dL — AB (ref 13.0–18.0)
LYMPHS ABS: 2.1 10*3/uL (ref 1.0–3.6)
Lymphocytes Relative: 7 %
MCH: 32.2 pg (ref 26.0–34.0)
MCHC: 33.1 g/dL (ref 32.0–36.0)
MCV: 97.1 fL (ref 80.0–100.0)
MONO ABS: 3.3 10*3/uL — AB (ref 0.2–1.0)
Monocytes Relative: 11 %
NEUTROS ABS: 24.2 10*3/uL — AB (ref 1.4–6.5)
Neutrophils Relative %: 80 %
Platelets: 231 10*3/uL (ref 150–440)
RBC: 3.24 MIL/uL — AB (ref 4.40–5.90)
RDW: 23.3 % — ABNORMAL HIGH (ref 11.5–14.5)
WBC: 30.2 10*3/uL — AB (ref 3.8–10.6)

## 2015-08-10 LAB — PROTIME-INR
INR: 2.13
PROTHROMBIN TIME: 23.7 s — AB (ref 11.4–15.0)

## 2015-08-10 MED ORDER — BISACODYL 5 MG PO TBEC
10.0000 mg | DELAYED_RELEASE_TABLET | Freq: Two times a day (BID) | ORAL | Status: DC | PRN
  Administered 2015-08-10: 10 mg via ORAL
  Filled 2015-08-10: qty 2

## 2015-08-10 NOTE — Progress Notes (Signed)
Pt ambulated in the hall with standby assist O2 sats dropped to 89% momentarily. O2 sats up to 92% following ambulation.

## 2015-08-10 NOTE — Progress Notes (Signed)
[facility] NAME: Thomas Townsend    MR#:  [id number]  DATE OF BIRTH:  12/11/1930  SUBJECTIVE:  CHIEF COMPLAINT:   Chief Complaint  Patient presents with  . Altered Mental Status   the patient is alert, awake , wants to go home. Apparently in the past was considered for oxygen therapy at home. Due to COPD.  He has no complaints. Transfuse packed red blood cells is sedated with improvement of blood pressure . Hemoglobin level has improved to 10.4. Chest assay showed improvement in right lower lobe pneumonia, associated small pleural effusions were noted, but no pulmonary edema.   REVIEW OF SYSTEMS:  CONSTITUTIONAL: No fever, has generalized weakness.  EYES: No blurred or double vision.  EARS, NOSE, AND THROAT: No tinnitus or ear pain.  RESPIRATORY: No cough, shortness of breath, wheezing or hemoptysis.  CARDIOVASCULAR: No chest pain, orthopnea, edema.  GASTROINTESTINAL: No nausea, vomiting, diarrhea or abdominal pain.  GENITOURINARY: No dysuria, hematuria.  ENDOCRINE: No polyuria, nocturia,  HEMATOLOGY: No anemia, easy bruising or bleeding SKIN: No rash or lesion. MUSCULOSKELETAL: No joint pain or arthritis.   NEUROLOGIC: No tingling, numbness, weakness.  PSYCHIATRY: No anxiety or depression.   DRUG ALLERGIES:  No Known Allergies  VITALS:  Blood pressure 115/64, pulse 86, temperature 98.7 F (37.1 C), temperature source Oral, resp. rate 18, height 5\' 7"  (1.702 m), weight 67.132 kg (148 lb), SpO2 95 %.  PHYSICAL EXAMINATION:  GENERAL:  81 y.o.-year-old patient lying in the bed with no acute distress.  EYES: Pupils equal, round, reactive to light and accommodation. No scleral icterus. Extraocular muscles intact.  HEENT: Head atraumatic, normocephalic. Oropharynx and nasopharynx clear.  NECK:  Supple, no jugular venous distention. No thyroid enlargement, no tenderness.  LUNGS: Coarse breath sounds bilaterally, no wheezing  and right-sided crackles. No use of accessory muscles of respiration.  CARDIOVASCULAR: S1, S2 normal. No murmurs, rubs, or gallops.  ABDOMEN: Soft, nontender, nondistended. Bowel sounds present. No organomegaly or mass.  EXTREMITIES: No pedal edema, cyanosis, or clubbing.  NEUROLOGIC: Cranial nerves II through XII are intact. Muscle strength 4/5 in all extremities. Sensation intact. Gait not checked.  PSYCHIATRIC: The patient is alert but lethargic.  SKIN: No obvious rash, lesion, or ulcer.    LABORATORY PANEL:   CBC  Recent Labs Lab 08/10/15 0125  WBC 30.2*  HGB 10.4*  HCT 31.5*  PLT 231   ------------------------------------------------------------------------------------------------------------------  Chemistries   Recent Labs Lab 08/06/15 0135  08/08/15 0440  NA 139  < > 138  K 4.0  < > 3.8  CL 102  < > 108  CO2 24  < > 24  GLUCOSE 140*  < > 97  BUN 26*  < > 13  CREATININE 1.22  < > 0.87  CALCIUM 8.6*  < > 7.9*  MG  --   --  2.1  AST 25  --   --   ALT 13*  --   --   ALKPHOS 71  --   --   BILITOT 0.5  --   --   < > = values in this interval not displayed. ------------------------------------------------------------------------------------------------------------------  Cardiac Enzymes No results for input(s): TROPONINI in the last 168 hours. ------------------------------------------------------------------------------------------------------------------  RADIOLOGY:  Dg Chest 2 View  08/10/2015  CLINICAL DATA:  Patient with pneumonia.  Subsequent encounter. EXAM: CHEST  2 VIEW COMPARISON:  08/06/2015 FINDINGS: Right lung base consolidation has improved. There are areas of coarse reticular type opacity  in the lungs mostly at the bases, consistent with scarring. Some additional opacity noted in the lower lobes, right greater left, likely due to atelectasis. There are small bilateral pleural effusions. Some residual pneumonia at the right lung base is suspected. No  pulmonary edema.  No pneumothorax. Cardiac silhouette is normal in size. No mediastinal or hilar masses or evidence of adenopathy. IMPRESSION: 1. Improved right lower lobe pneumonia. 2. Mild atelectasis at the lung bases with associated small effusions. No pulmonary edema. Electronically Signed   By: Lajean Manes M.D.   On: 08/10/2015 09:36    EKG:   Orders placed or performed during the hospital encounter of 08/06/15  . ED EKG 12-Lead  . ED EKG 12-Lead    ASSESSMENT AND PLAN:   1. Septic shock with pneumonia. Resolved. off Levophed drip. Continue Zosyn, blood cultures are negative as well as sputum cultures consistent with normal respiratory flora , discontinue IV fluid . WBC is up, related to blood cancer per patient's family  2. Atrial fibrillation rapid ventricular response:  Of IV fluid hydration, continue digoxin and warfarin, follow-up INR. No Cardizem or Lopressor due to low BP. INR is therapeutic.  3. COPD with acute on chronic likely respiratory failure: Will likely need oxygen therapy at home , continue DuoNeb, Pulmicort, Singulair , steroids , following clinically  4. History of CHF. Unclear type. Some fluid overload with blood transfusion. echocardiogram. Revealed normal ejection fraction, mild LVH, mild aortic stenosis and moderately elevated pulmonary arterial pressures of 55 mmHg. Supportive therapy at present, may be able to initiate the diuretics if blood pressure is stable   * acute on chronic likely respiratory failure with hypoxia.  Continue O2 [place] and NEB. to initiate Lasix at low dose , provided patient blood pressure readings are good  *Anemia of chronic disease. No active bleeding. Hemoglobin improved to about 10 after PRBC transfusion , follow-up CBC in the morning.   * Acute metabolic encephalopathy. Improved. Aspiration precaution.  * Lactic Acidosis. Improved  *Generalized weakness. Physical therapist evaluation is still pending, likely home health physical  therapy. Discussed with patient's daughter extensively  Transfer to medical floor with telemetry monitor. Discontinue Foley and Start physical therapy evaluation.  All the records are reviewed and case discussed with Care Management/Social Workerr. Management plans discussed with the patient, his duaghter and they are in agreement. Greater than 50% time was spent on coordination of care and face-to-face counseling.  CODE STATUS: Full code  TOTAL TIME TAKING CARE OF THIS PATIENT: 45 minutes.  Coordination of care time 15 minutes POSSIBLE D/C IN 3 DAYS, DEPENDING ON CLINICAL CONDITION.   Theodoro Grist M.D on 08/10/2015 at 2:28 PM  Between 7am to 6pm - Pager - [phone]  After 6pm go to [email or web address] - password [email or web address] [facility]  Office  [phone]  CC: Primary care physician; Allyne Gee, MD

## 2015-08-10 NOTE — Plan of Care (Signed)
Problem: Education: Goal: Knowledge of [facility] Education information/materials will improve Outcome: Progressing Reinforced high fall risk and safety precautions. Pt verbalized understanding of aspirations precautions and fall precautions. Pt also demonstrated use of call light.   Problem: Safety: Goal: Ability to remain free from injury will improve Outcome: Progressing High fall risk with bed alarm activated; patient remained free of injury this shift.   Problem: Health Behavior/Discharge Planning: Goal: Ability to manage health-related needs will improve Outcome: Progressing Patient compliant with plan of care, medications, and treatment.   Problem: Physical Regulation: Goal: Ability to maintain clinical measurements within normal limits will improve Outcome: Progressing Status post 1 unit pRBC's, H&H improving. O2 saturations have maintained greater that 92% on 1 LPM via [place].  Goal: Will remain free from infection Outcome: Progressing Patient practicing hand hygiene when appropriate.    Problem: Skin Integrity: Goal: Risk for impaired skin integrity will decrease Outcome: Progressing Patient noted with fair appetite, ambulatory with minimal assist, able to turn self in bed, and skin intact.  Problem: Tissue Perfusion: Goal: Risk factors for ineffective tissue perfusion will decrease Outcome: Progressing Patient up to BR with standby assist, continues with 1 LPM via [place] to maintain oxygen >92%.  Problem: Activity: Goal: Risk for activity intolerance will decrease Outcome: Progressing Patient up to BR with standby assist, continues with 1 LPM via [place] to maintain oxygen >92%.  Problem: Fluid Volume: Goal: Ability to maintain a balanced intake and output will improve Outcome: Progressing IVF initiated per MD order after blood transfusion completed. Patient using urinal for accurate output measurements.   Problem: Nutrition: Goal: Adequate nutrition will be  maintained Outcome: Progressing Per report patient noted with fair appetite, snacks and protein shake encouraged.  Problem: Bowel/Gastric: Goal: Will not experience complications related to bowel motility Outcome: Progressing Last BM 08/07/2015, pt states that he does not feel constipated, declines any further actions to assist with BM during this shift.

## 2015-08-10 NOTE — Progress Notes (Signed)
Speech Language Pathology Treatment:    Patient Details Name: HAMID HASER MRN: [id number] DOB: October 10, 1930 Today's Date: 08/10/2015 Time:  -     Assessment / Plan / Recommendation Clinical Impression     HPI        SLP Plan        Recommendations                     Lucila Maine 08/10/2015, 6:32 PM

## 2015-08-10 NOTE — Progress Notes (Signed)
ANTICOAGULATION CONSULT NOTE - Follow Up Consult  Pharmacy Consult for Coumadin Indication: atrial fibrillation  No Known Allergies  Patient Measurements: Height: 5\' 7"  (170.2 cm) Weight: 148 lb (67.132 kg) IBW/kg (Calculated) : 66.1   Vital Signs: Temp: 98.7 F (37.1 C) (11/12 2023) Temp Source: Oral (11/12 2023) BP: 94/49 mmHg (11/13 0519) Pulse Rate: 95 (11/13 0519)  Labs:  Recent Labs  08/08/15 0440 08/09/15 0419 08/10/15 0125  HGB 7.7* 7.8* 10.4*  HCT 24.2* 24.3* 31.5*  PLT 192 201 231  LABPROT 26.3* 24.2* 23.7*  INR 2.45 2.19 2.13  CREATININE 0.87  --   --     Estimated Creatinine Clearance: 62.3 mL/min (by C-G formula based on Cr of 0.87).   Medical History: Past Medical History  Diagnosis Date  . COPD (chronic obstructive pulmonary disease) ([place])   . Atrial fibrillation ([place])   . Prostate cancer ([place])   . GERD (gastroesophageal reflux disease)   . CHF (congestive heart failure) ([place])   . Dysrhythmia   . Arthritis   . Hypertension   . Hyperlipemia     Medications:  Scheduled:  . arformoterol  15 mcg Nebulization BID  . budesonide (PULMICORT) nebulizer solution  0.25 mg Nebulization BID  . digoxin  0.125 mg Oral Daily  . feeding supplement (ENSURE ENLIVE)  237 mL Oral BID BM  . ferrous sulfate  325 mg Oral q morning - [id number]  . folic acid  1 mg Oral Daily  . montelukast  10 mg Oral Daily  . piperacillin-tazobactam (ZOSYN)  IV  3.375 g Intravenous Q8H  . predniSONE  5 mg Oral Q breakfast  . sodium chloride  3 mL Intravenous Q12H  . warfarin  4 mg Oral Daily  . Warfarin - Pharmacist Dosing Inpatient   Does not apply q1800   Infusions:  . sodium chloride 100 mL/hr at 08/10/15 0457  . sodium chloride Stopped (08/09/15 2023)    Assessment: 81 y/o M admitted with sepsis on Coumadin 4 mg daily PTA. INR Is therapeutic, however patient is on antibiotics which may increase INR.   INR therapeutic @ 2.13 Goal of Therapy:  INR 2-3   Plan:   Will  start warfarin 4 mg PO daily (home dose). INR with am labs.    Maxum Cassarino D 08/10/2015,8:10 AM

## 2015-08-10 NOTE — Plan of Care (Signed)
Problem: Education: Goal: Knowledge of [facility] Education information/materials will improve Outcome: Progressing High fall risk precautions maintained. Pt remained free of injury.     Problem: Health Behavior/Discharge Planning: Goal: Ability to manage health-related needs will improve Outcome: Progressing Pt was assessed today for possible O2 at discharge.Pt ambulated in the hall with standby assist O2 sats dropped to 89% momentarily. O2 sats up to 92% following ambulation. Pt remains on room air.             Problem: Physical Regulation: Goal: Ability to maintain clinical measurements within normal limits will improve Outcome: Progressing VSS. A fib on telemetry. On room air.  Problem: Skin Integrity: Goal: Risk for impaired skin integrity will decrease Outcome: Progressing Appetite improving. Pt is ambulatory and able to turn self. Heels elevated. Skin intact.      Problem: Tissue Perfusion: Goal: Risk factors for ineffective tissue perfusion will decrease Outcome: Progressing Pt ambulates with standby assist. Turn self in the bed.   Problem: Fluid Volume: Goal: Ability to maintain a balanced intake and output will improve Outcome: Progressing VSS. IVF d/c. Fluids encouraged. Voids without difficulty.  Problem: Bowel/Gastric: Goal: Will not experience complications related to bowel motility Outcome: Progressing Last BM on the Nov 10th. Pt c/o constipation.MD notified. Bisacodyl ordered and given. No BM yet.

## 2015-08-11 DIAGNOSIS — R531 Weakness: Secondary | ICD-10-CM

## 2015-08-11 DIAGNOSIS — E872 Acidosis, unspecified: Secondary | ICD-10-CM

## 2015-08-11 DIAGNOSIS — I272 Pulmonary hypertension, unspecified: Secondary | ICD-10-CM

## 2015-08-11 DIAGNOSIS — D638 Anemia in other chronic diseases classified elsewhere: Secondary | ICD-10-CM

## 2015-08-11 DIAGNOSIS — J9601 Acute respiratory failure with hypoxia: Secondary | ICD-10-CM

## 2015-08-11 DIAGNOSIS — I350 Nonrheumatic aortic (valve) stenosis: Secondary | ICD-10-CM

## 2015-08-11 LAB — CULTURE, BLOOD (ROUTINE X 2)
CULTURE: NO GROWTH
Culture: NO GROWTH

## 2015-08-11 LAB — PROTIME-INR
INR: 2.22
Prothrombin Time: 24.4 seconds — ABNORMAL HIGH (ref 11.4–15.0)

## 2015-08-11 MED ORDER — LEVOFLOXACIN 500 MG PO TABS: 500.0000 mg | ORAL_TABLET | Freq: Every day | ORAL | Status: DC

## 2015-08-11 MED ORDER — IPRATROPIUM-ALBUTEROL 0.5-2.5 (3) MG/3ML IN SOLN: 3.0000 mL | RESPIRATORY_TRACT | Status: DC | PRN

## 2015-08-11 MED ORDER — LEVOFLOXACIN 500 MG PO TABS
500.0000 mg | ORAL_TABLET | Freq: Every day | ORAL | Status: DC
Start: 2015-08-11 — End: 2015-08-11
  Administered 2015-08-11: 11:00:00 500 mg via ORAL
  Filled 2015-08-11: qty 1

## 2015-08-11 MED ORDER — BISACODYL 5 MG PO TBEC: 10.0000 mg | DELAYED_RELEASE_TABLET | Freq: Two times a day (BID) | ORAL | Status: DC | PRN

## 2015-08-11 NOTE — Plan of Care (Signed)
Problem: Education: Goal: Knowledge of [facility] Education information/materials will improve Outcome: Progressing High fall risk with bed alarm activated, pt up multiple times this shift for BM (dulcolax given 08/10/2015 for constipation). Pt continued to ask if he could just get up and go. Reinforced that fall risk and bed alarm were only to keep him safe and encouraged to continue to call and wait for assistance while getting up to BR. Pt compliant with instructions and safety precautions, remained free of fall or injury this shift.      Problem: Safety: Goal: Ability to remain free from injury will improve Outcome: Progressing High fall risk with bed alarm activated, pt verbalized and demonstrated proper use of call light. Pt has remained free of fall or injury this shift.  Problem: Health Behavior/Discharge Planning: Goal: Ability to manage health-related needs will improve Outcome: Progressing Pt is alert and oriented, from home with son and daughter. Pt has verbalized that he would like to return home at time of discharge, pt accepts assistance and verbalizes understanding of importance of calling for assistance.     Problem: Skin Integrity: Goal: Risk for impaired skin integrity will decrease Outcome: Progressing Pt is ambulatory, able to turn self in bed. PO intake improving. Skin intact.      Problem: Tissue Perfusion: Goal: Risk factors for ineffective tissue perfusion will decrease Outcome: Progressing Patient continues on room air, maintaining O2 sats at 97%. Pt up with ambulation to BR, no c/o dyspnea.     Problem: Activity: Goal: Risk for activity intolerance will decrease Outcome: Progressing Pt up ambulating to BR with assistance, turns self in bed and remains on room air.     Problem: Fluid Volume: Goal: Ability to maintain a balanced intake and output will improve Outcome: Progressing Appetite improving, pt up to BR to void and for BM this shift.    Problem: Bowel/Gastric: Goal: Will not experience complications related to bowel motility Outcome: Progressing Dulcolax PO given 08/10/2015, pt had multiple small BM's and then reported this morning that he had a large BM, brown in color.

## 2015-08-11 NOTE — Discharge Summary (Signed)
[facility] NAME: Thomas Townsend    MR#:  [id number]  DATE OF BIRTH:  07-Nov-1930  DATE OF ADMISSION:  08/06/2015 ADMITTING PHYSICIAN: Demetrios Loll, MD  DATE OF DISCHARGE: 08/11/2015 12:00 PM  PRIMARY CARE PHYSICIAN: KHAN,SAADAT A, MD     ADMISSION DIAGNOSIS:  Cough [R05] Hypoxia [R09.02] Fever [R50.9] Sepsis, due to unspecified organism [facility]) [A41.9] Abscess of lower lobe of right lung with pneumonia ([place]) [J85.1]  DISCHARGE DIAGNOSIS:  Principal Problem:   Sepsis ([place]) Active Problems:   CAP (community acquired pneumonia)   Septic shock ([place])   Acidosis, lactic   Acute respiratory failure with hypoxia (HCC)   Atrial fibrillation with rapid ventricular response (HCC)   Encephalopathy, metabolic   Pneumonia   Pulmonary hypertension (HCC)   Mild aortic stenosis   Generalized weakness   Anemia of chronic disease   SECONDARY DIAGNOSIS:   Past Medical History  Diagnosis Date  . COPD (chronic obstructive pulmonary disease) ([place])   . Atrial fibrillation ([place])   . Prostate cancer ([place])   . GERD (gastroesophageal reflux disease)   . CHF (congestive heart failure) ([place])   . Dysrhythmia   . Arthritis   . Hypertension   . Hyperlipemia     .pro HOSPITAL COURSE:   The patient is 81 year old Caucasian male with history of COPD, atrial fibrillation, being treated with warfarin who presents to the hospital with complaints of altered mental status, confusion, fever, cough, shortness of breath. On arrival to the hospital patient was noted to have a right basilar pneumonia, he was initiated on antibiotic therapy and admitted to the hospital. Initial vitals revealed borderline hypotension and tachycardia as well as tachypnea, signifying sepsis. This conservative therapy, however, patient's condition improved , he was weaned off oxygen and was deemed to be stable to be discharged home Discussion by the problem 1. Sepsis with  septic shock due to pneumonia. Resolved. Patient required Levophed drip initially, which was weaned off. Blood cultures remained negative. Sputum cultures showed normal respiratory flora , patient initially received vancomycin with Zosyn, patient is to continue antibiotics with levofloxacin for 5 more days to complete course  2. Atrial fibrillation rapid ventricular response:  Patient is to resume his outpatient medications including digoxin and warfarin, follow-up INR as outpatient. Patient's blood pressure has improved. He is to resume Cardizem and Lopressor, patient is to follow-up with his primary care physician for recommendations  3. COPD with acute on chronic likely respiratory failure with hypoxia: Patient was weaned off oxygen , he was recommended to continue DuoNeb, Pulmicort, Singulair , steroid taper , patient is to follow-up with pulmonologist, Dr. Humphrey Rolls for recommendations  4. Chronic diastolic CHF.  Some fluid overload after blood transfusion. Echocardiogram revealed normal ejection fraction, mild LVH, mild aortic stenosis and moderately elevated pulmonary arterial pressures of 55 mmHg. Supportive therapy was recommended  , resume Lasix   5 acute on chronic likely respiratory failure with hypoxia. Continue O2 [place] and NEB. Marland Kitchen Resume Lasix at home dose 6 Anemia of chronic disease. No active bleeding. Hemoglobin improved to about 10 after PRBC transfusion , follow-up CBC as outpatient.   7 Acute metabolic encephalopathy. Resolved. .  8 Lactic Acidosis. Resolved  9 Generalized weakness. Physical therapist recommended home health physical therapy, which will be arranged for patient upon discharge DISCHARGE CONDITIONS:   Stable  CONSULTS OBTAINED:  Treatment Team:  Lytle Butte, MD  DRUG ALLERGIES:  No Known Allergies  DISCHARGE MEDICATIONS:  Discharge Medication List as of 08/11/2015 11:18 AM    START taking these medications   Details  bisacodyl (DULCOLAX) 5 MG EC  tablet Take 2 tablets (10 mg total) by mouth 2 (two) times daily as needed for moderate constipation., Starting 08/11/2015, Until Discontinued, Normal    ipratropium-albuterol (DUONEB) 0.5-2.5 (3) MG/3ML SOLN Take 3 mLs by nebulization every 4 (four) hours as needed., Starting 08/11/2015, Until Discontinued, Normal    levofloxacin (LEVAQUIN) 500 MG tablet Take 1 tablet (500 mg total) by mouth daily., Starting 08/11/2015, Until Discontinued, Normal      CONTINUE these medications which have NOT CHANGED   Details  BREO ELLIPTA 100-25 MCG/INH AEPB Inhale 1 puff into the lungs every morning., Starting 01/28/2015, Until Discontinued, Historical Med    digoxin (LANOXIN) 0.125 MG tablet Take 0.125 mg by mouth daily. , Until Discontinued, Historical Med    ferrous sulfate 325 (65 FE) MG tablet Take 325 mg by mouth every morning. , Until Discontinued, Historical Med    folic acid (FOLVITE) 1 MG tablet Take 1 mg by mouth daily. , Until Discontinued, Historical Med    furosemide (LASIX) 20 MG tablet Take 20 mg by mouth daily. In the morning, Until Discontinued, Historical Med    HYDROcodone-ibuprofen (VICOPROFEN) 7.5-200 MG tablet Take 1 tablet by mouth every 8 (eight) hours as needed., Until Discontinued, Historical Med    methotrexate (RHEUMATREX) 2.5 MG tablet Take 15 mg by mouth every 7 (seven) days. , Until Discontinued, Historical Med    montelukast (SINGULAIR) 10 MG tablet Take 1 tablet by mouth daily. , Until Discontinued, Historical Med    Multiple Vitamins-Minerals (PRESERVISION AREDS PO) Take 1 tablet by mouth daily. , Until Discontinued, Historical Med    prednisoLONE 5 MG TABS tablet Take 5 mg by mouth daily., Until Discontinued, Historical Med    warfarin (COUMADIN) 4 MG tablet Take 4 mg by mouth daily., Until Discontinued, Historical Med         DISCHARGE INSTRUCTIONS:    Patient is to follow-up with his primary care physician as outpatient, following his blood pressure  readings closely  If you experience worsening of your admission symptoms, develop shortness of breath, life threatening emergency, suicidal or homicidal thoughts you must seek medical attention immediately by calling 911 or calling your MD immediately  if symptoms less severe.  You Must read complete instructions/literature along with all the possible adverse reactions/side effects for all the Medicines you take and that have been prescribed to you. Take any new Medicines after you have completely understood and accept all the possible adverse reactions/side effects.   Please note  You were cared for by a hospitalist during your hospital stay. If you have any questions about your discharge medications or the care you received while you were in the hospital after you are discharged, you can call the unit and asked to speak with the hospitalist on call if the hospitalist that took care of you is not available. Once you are discharged, your primary care physician will handle any further medical issues. Please note that NO REFILLS for any discharge medications will be authorized once you are discharged, as it is imperative that you return to your primary care physician (or establish a relationship with a primary care physician if you do not have one) for your aftercare needs so that they can reassess your need for medications and monitor your lab values.    Today   CHIEF COMPLAINT:   Chief Complaint  Patient presents  with  . Altered Mental Status    HISTORY OF PRESENT ILLNESS:  Thomas Townsend  is a 81 y.o. male with a known history of  COPD, atrial fibrillation, being treated with warfarin who presents to the hospital with complaints of altered mental status, confusion, fever, cough, shortness of breath. On arrival to the hospital patient was noted to have a right basilar pneumonia, he was initiated on antibiotic therapy and admitted to the hospital. Initial vitals revealed borderline hypotension  and tachycardia as well as tachypnea, signifying sepsis. This conservative therapy, however, patient's condition improved , he was weaned off oxygen and was deemed to be stable to be discharged home Discussion by the problem 1. Sepsis with septic shock due to pneumonia. Resolved. Patient required Levophed drip initially, which was weaned off. Blood cultures remained negative. Sputum cultures showed normal respiratory flora , patient initially received vancomycin with Zosyn, patient is to continue antibiotics with levofloxacin for 5 more days to complete course  2. Atrial fibrillation rapid ventricular response:  Patient is to resume his outpatient medications including digoxin and warfarin, follow-up INR as outpatient. Patient's blood pressure has improved. He is to resume Cardizem and Lopressor, patient is to follow-up with his primary care physician for recommendations  3. COPD with acute on chronic likely respiratory failure with hypoxia: Patient was weaned off oxygen , he was recommended to continue DuoNeb, Pulmicort, Singulair , steroid taper , patient is to follow-up with pulmonologist, Dr. Humphrey Rolls for recommendations  4. Chronic diastolic CHF.  Some fluid overload after blood transfusion. Echocardiogram revealed normal ejection fraction, mild LVH, mild aortic stenosis and moderately elevated pulmonary arterial pressures of 55 mmHg. Supportive therapy was recommended  , resume Lasix   5 acute on chronic likely respiratory failure with hypoxia. Continue O2 [place] and NEB. Marland Kitchen Resume Lasix at home dose 6 Anemia of chronic disease. No active bleeding. Hemoglobin improved to about 10 after PRBC transfusion , follow-up CBC as outpatient.   7 Acute metabolic encephalopathy. Resolved. .  8 Lactic Acidosis. Resolved  9 Generalized weakness. Physical therapist recommended home health physical therapy, which will be arranged for patient upon discharge    VITAL SIGNS:  Blood pressure 117/57, pulse 55,  temperature 97.9 F (36.6 C), temperature source Oral, resp. rate 20, height 5\' 7"  (1.702 m), weight 67.132 kg (148 lb), SpO2 95 %.  I/O:   Intake/Output Summary (Last 24 hours) at 08/11/15 1618 Last data filed at 08/11/15 1001  Gross per 24 hour  Intake      0 ml  Output      0 ml  Net      0 ml    PHYSICAL EXAMINATION:  GENERAL:  81 y.o.-year-old patient lying in the bed with no acute distress.  EYES: Pupils equal, round, reactive to light and accommodation. No scleral icterus. Extraocular muscles intact.  HEENT: Head atraumatic, normocephalic. Oropharynx and nasopharynx clear.  NECK:  Supple, no jugular venous distention. No thyroid enlargement, no tenderness.  LUNGS: Normal breath sounds bilaterally, no wheezing, rales,rhonchi or crepitation. No use of accessory muscles of respiration.  CARDIOVASCULAR: S1, S2 normal. No murmurs, rubs, or gallops.  ABDOMEN: Soft, non-tender, non-distended. Bowel sounds present. No organomegaly or mass.  EXTREMITIES: No pedal edema, cyanosis, or clubbing.  NEUROLOGIC: Cranial nerves II through XII are intact. Muscle strength 5/5 in all extremities. Sensation intact. Gait not checked.  PSYCHIATRIC: The patient is alert and oriented x 3.  SKIN: No obvious rash, lesion, or ulcer.   DATA REVIEW:  CBC  Recent Labs Lab 08/10/15 0125  WBC 30.2*  HGB 10.4*  HCT 31.5*  PLT 231    Chemistries   Recent Labs Lab 08/06/15 0135  08/08/15 0440  NA 139  < > 138  K 4.0  < > 3.8  CL 102  < > 108  CO2 24  < > 24  GLUCOSE 140*  < > 97  BUN 26*  < > 13  CREATININE 1.22  < > 0.87  CALCIUM 8.6*  < > 7.9*  MG  --   --  2.1  AST 25  --   --   ALT 13*  --   --   ALKPHOS 71  --   --   BILITOT 0.5  --   --   < > = values in this interval not displayed.  Cardiac Enzymes No results for input(s): TROPONINI in the last 168 hours.  Microbiology Results  Results for orders placed or performed during the hospital encounter of 08/06/15  Blood Culture  (routine x 2)     Status: None   Collection Time: 08/06/15  1:34 AM  Result Value Ref Range Status   Specimen Description BLOOD BLOOD RIGHT FOREARM  Final   Special Requests BOTTLES DRAWN AEROBIC AND ANAEROBIC 5ML  Final   Culture NO GROWTH 5 DAYS  Final   Report Status 08/11/2015 FINAL  Final  Blood Culture (routine x 2)     Status: None   Collection Time: 08/06/15  1:34 AM  Result Value Ref Range Status   Specimen Description BLOOD RIGHT HAND  Final   Special Requests BOTTLES DRAWN AEROBIC AND ANAEROBIC 5ML  Final   Culture NO GROWTH 5 DAYS  Final   Report Status 08/11/2015 FINAL  Final  Urine culture     Status: None   Collection Time: 08/06/15  1:34 AM  Result Value Ref Range Status   Specimen Description URINE, RANDOM  Final   Special Requests NONE  Final   Culture NO GROWTH 1 DAY  Final   Report Status 08/07/2015 FINAL  Final  MRSA PCR Screening     Status: None   Collection Time: 08/06/15  8:43 AM  Result Value Ref Range Status   MRSA by PCR NEGATIVE NEGATIVE Final    Comment:        The GeneXpert MRSA Assay (FDA approved for NASAL specimens only), is one component of a comprehensive MRSA colonization surveillance program. It is not intended to diagnose MRSA infection nor to guide or monitor treatment for MRSA infections.   Culture, expectorated sputum-assessment     Status: None   Collection Time: 08/07/15 12:26 PM  Result Value Ref Range Status   Specimen Description EXPECTORATED SPUTUM  Final   Special Requests NONE  Final   Sputum evaluation THIS SPECIMEN IS ACCEPTABLE FOR SPUTUM CULTURE  Final   Report Status 08/07/2015 FINAL  Final  Culture, respiratory (NON-Expectorated)     Status: None   Collection Time: 08/07/15 12:26 PM  Result Value Ref Range Status   Specimen Description EXPECTORATED SPUTUM  Final   Special Requests NONE Reflexed from [id number]  Final   Gram Stain   Final    GOOD SPECIMEN - 80-90% WBCS MANY WBC SEEN RARE GRAM POSITIVE COCCI     Culture Consistent with normal respiratory flora.  Final   Report Status 08/09/2015 FINAL  Final    RADIOLOGY:  Dg Chest 2 View  08/10/2015  CLINICAL DATA:  Patient with pneumonia.  Subsequent encounter. EXAM: CHEST  2 VIEW COMPARISON:  08/06/2015 FINDINGS: Right lung base consolidation has improved. There are areas of coarse reticular type opacity in the lungs mostly at the bases, consistent with scarring. Some additional opacity noted in the lower lobes, right greater left, likely due to atelectasis. There are small bilateral pleural effusions. Some residual pneumonia at the right lung base is suspected. No pulmonary edema.  No pneumothorax. Cardiac silhouette is normal in size. No mediastinal or hilar masses or evidence of adenopathy. IMPRESSION: 1. Improved right lower lobe pneumonia. 2. Mild atelectasis at the lung bases with associated small effusions. No pulmonary edema. Electronically Signed   By: Lajean Manes M.D.   On: 08/10/2015 09:36    EKG:   Orders placed or performed during the hospital encounter of 08/06/15  . ED EKG 12-Lead  . ED EKG 12-Lead      Management plans discussed with the patient, family and they are in agreement.  CODE STATUS:   TOTAL TIME TAKING CARE OF THIS PATIENT: 40 minutes.    Theodoro Grist M.D on 08/11/2015 at 4:18 PM  Between 7am to 6pm - Pager - [phone]  After 6pm go to [email or web address] - password [email or web address] [place] [facility]  Office  [phone]  CC: Primary care physician; Allyne Gee, MD

## 2015-08-11 NOTE — Discharge Instructions (Signed)
Abscess An abscess is an infected area that contains a collection of pus and debris.It can occur in almost any part of the body. An abscess is also known as a furuncle or boil. CAUSES  An abscess occurs when tissue gets infected. This can occur from blockage of oil or sweat glands, infection of hair follicles, or a minor injury to the skin. As the body tries to fight the infection, pus collects in the area and creates pressure under the skin. This pressure causes pain. People with weakened immune systems have difficulty fighting infections and get certain abscesses more often.  SYMPTOMS Usually an abscess develops on the skin and becomes a painful mass that is red, warm, and tender. If the abscess forms under the skin, you may feel a moveable soft area under the skin. Some abscesses break open (rupture) on their own, but most will continue to get worse without care. The infection can spread deeper into the body and eventually into the bloodstream, causing you to feel ill.  DIAGNOSIS  Your caregiver will take your medical history and perform a physical exam. A sample of fluid may also be taken from the abscess to determine what is causing your infection. TREATMENT  Your caregiver may prescribe antibiotic medicines to fight the infection. However, taking antibiotics alone usually does not cure an abscess. Your caregiver may need to make a small cut (incision) in the abscess to drain the pus. In some cases, gauze is packed into the abscess to reduce pain and to continue draining the area. HOME CARE INSTRUCTIONS   Only take over-the-counter or prescription medicines for pain, discomfort, or fever as directed by your caregiver.  If you were prescribed antibiotics, take them as directed. Finish them even if you start to feel better.  If gauze is used, follow your caregiver's directions for changing the gauze.  To avoid spreading the infection:  Keep your draining abscess covered with a  bandage.  Wash your hands well.  Do not share personal care items, towels, or whirlpools with others.  Avoid skin contact with others.  Keep your skin and clothes clean around the abscess.  Keep all follow-up appointments as directed by your caregiver. SEEK MEDICAL CARE IF:   You have increased pain, swelling, redness, fluid drainage, or bleeding.  You have muscle aches, chills, or a general ill feeling.  You have a fever. MAKE SURE YOU:   Understand these instructions.  Will watch your condition.  Will get help right away if you are not doing well or get worse.   This information is not intended to replace advice given to you by your health care provider. Make sure you discuss any questions you have with your health care provider.   Document Released: 06/23/2005 Document Revised: 03/14/2012 Document Reviewed: 11/26/2011 Elsevier Interactive Patient Education 2016 Elsevier Inc.  Acute Respiratory Distress Syndrome Acute respiratory distress syndrome is a life-threatening condition in which fluid collects in the lungs. This condition can cause severe shortness of breath and low oxygen levels in the blood. It can also cause the lungs and other vital organs to fail. The condition usually develops within 24 to 48 hours of an infection, illness, surgery, or injury. CAUSES This condition may be caused by:  An infection, such as sepsis or pneumonia.  A serious injury to the head or chest.  A major surgery.  A drug overdose.  Breathing in harmful chemicals or smoke.  Blood transfusions.  A blood clot in the lungs. SYMPTOMS Symptoms of  this condition include:  Fever.  Difficulty breathing.  Bluish skin (cyanosis).  A fast or irregular heartbeat.  Low blood pressure (hypotension).  Agitation.  Confusion.  Lack of energy (lethargy).  Sweating. DIAGNOSIS This condition may be diagnosed by using tests to rule out other diseases and conditions that cause  similar symptoms. You may have:  A chest X-ray or CT scan.  Blood tests.  A sputum culture. In this test, you will be asked to spit so that a sample of lung fluid can be taken for testing.  Bronchoscopy. During this test a thin, flexible tool is passed into the mouth or nose, down the windpipe, and into the lungs. TREATMENT This condition may be treated with:  Oxygen. A breathing machine (ventilator) is often used to provide oxygen and help with breathing.  Medicine to help you relax (sedative).  Fluids and nutrients given through an IV tube.  Blood pressure medicine.  Steroid medicine to help decrease inflammation in the lungs.  Diuretic medicine to get rid of extra fluid in the body. Additional treatment may be needed, depending on the cause of the condition. It can take up to 12 months to recover from this condition. Some people recover fully, but others may continue to have:  Weakness.  Shortness of breath.  Memory problems.  Depression. HOME CARE INSTRUCTIONS Until you recover from this condition:  Do not smoke.  Limit alcohol intake to no more than 1 drink per day for nonpregnant women and 2 drinks per day for men. One drink equals 12 oz of beer, 5 oz of wine, or 1 oz of hard liquor.  Ask friends and family to help you if daily activities make you tired. SEEK MEDICAL CARE IF:  You become short of breath with activity or while resting.  You develop a cough that does not go away.  You have a fever. SEEK IMMEDIATE MEDICAL CARE IF:  You have sudden shortness of breath.  You develop chest pain that does not go away.  You develop swelling or pain in one of your legs.  You cough up blood.   This information is not intended to replace advice given to you by your health care provider. Make sure you discuss any questions you have with your health care provider.   Document Released: 09/13/2005 Document Revised: 01/28/2015 Document Reviewed: 09/09/2014 Elsevier  Interactive Patient Education 2016 Elsevier Inc.  Acute Respiratory Distress Syndrome Acute respiratory distress syndrome is a life-threatening condition in which fluid collects in the lungs. This condition can cause severe shortness of breath and low oxygen levels in the blood. It can also cause the lungs and other vital organs to fail. The condition usually develops within 24 to 48 hours of an infection, illness, surgery, or injury. CAUSES This condition may be caused by:  An infection, such as sepsis or pneumonia.  A serious injury to the head or chest.  A major surgery.  A drug overdose.  Breathing in harmful chemicals or smoke.  Blood transfusions.  A blood clot in the lungs. SYMPTOMS Symptoms of this condition include:  Fever.  Difficulty breathing.  Bluish skin (cyanosis).  A fast or irregular heartbeat.  Low blood pressure (hypotension).  Agitation.  Confusion.  Lack of energy (lethargy).  Sweating. DIAGNOSIS This condition may be diagnosed by using tests to rule out other diseases and conditions that cause similar symptoms. You may have:  A chest X-ray or CT scan.  Blood tests.  A sputum culture. In this test, you  will be asked to spit so that a sample of lung fluid can be taken for testing.  Bronchoscopy. During this test a thin, flexible tool is passed into the mouth or nose, down the windpipe, and into the lungs. TREATMENT This condition may be treated with:  Oxygen. A breathing machine (ventilator) is often used to provide oxygen and help with breathing.  Medicine to help you relax (sedative).  Fluids and nutrients given through an IV tube.  Blood pressure medicine.  Steroid medicine to help decrease inflammation in the lungs.  Diuretic medicine to get rid of extra fluid in the body. Additional treatment may be needed, depending on the cause of the condition. It can take up to 12 months to recover from this condition. Some people recover  fully, but others may continue to have:  Weakness.  Shortness of breath.  Memory problems.  Depression. HOME CARE INSTRUCTIONS Until you recover from this condition:  Do not smoke.  Limit alcohol intake to no more than 1 drink per day for nonpregnant women and 2 drinks per day for men. One drink equals 12 oz of beer, 5 oz of wine, or 1 oz of hard liquor.  Ask friends and family to help you if daily activities make you tired. SEEK MEDICAL CARE IF:  You become short of breath with activity or while resting.  You develop a cough that does not go away.  You have a fever. SEEK IMMEDIATE MEDICAL CARE IF:  You have sudden shortness of breath.  You develop chest pain that does not go away.  You develop swelling or pain in one of your legs.  You cough up blood.   This information is not intended to replace advice given to you by your health care provider. Make sure you discuss any questions you have with your health care provider.   Document Released: 09/13/2005 Document Revised: 01/28/2015 Document Reviewed: 09/09/2014 Elsevier Interactive Patient Education [employer].

## 2015-08-11 NOTE — Progress Notes (Signed)
MD making rounds. Discharge orders received. No Appointments to Schedule. Telemetry Removed. IV removed. Prescriptions E-Prescribed to Pharmacy. Discharge paperwork provided, explained, signed and witnessed. Education handouts provided to patient. No unanswered questions. Escorted via wheelchair by [employer] representative. All belongings sent with patient and family.

## 2015-08-11 NOTE — Care Management (Signed)
Discussed home health services with Dr. Ether Griffins. Would Like Mr. Elgart to have services in the home to assess the home environment. Discussed agencies with Mr. Yerena. [facility] in the past, would like them again. Floydene Flock, representative for [facility] updated.  Son will transport. Shelbie Ammons RN MSN CCM  Care Management [phone]

## 2015-08-11 NOTE — Care Management Important Message (Signed)
Important Message  Patient Details  Name: Thomas Townsend MRN: [id number] Date of Birth: 07/22/1931   Medicare Important Message Given:  Yes    Shelbie Ammons, RN 08/11/2015, 8:45 AM

## 2015-08-11 NOTE — Progress Notes (Signed)
   08/09/15 1415  SLP Visit Information  SLP Received On 08/09/15  General Information  Behavior/Cognition Alert  Patient Positioning Upright in bed  Temperature Spikes Noted No  Respiratory Status Supplemental O2 delivered via (comment)  Patient observed directly with PO's Yes  Type of PO's observed Dysphagia 2 (chopped)  Feeding Able to feed self  Liquids provided via Cup  Treatment Provided  Treatment provided Dysphagia  Dysphagia Treatment  Treatment Methods Skilled observation;Compensation strategy training;Patient/caregiver education  Oral Phase Signs & Symptoms Prolonged mastication  Pharyngeal Phase Signs & Symptoms Other (comment) (none)  Type of cueing Verbal  Amount of cueing Modified independent  Pain Assessment  Pain Assessment No/denies pain  SLP - End of Session  Patient left with bed alarm set  Nurse Communication Diet recommendation  Assessment / Recommendations / Plan  Plan Continue with current plan of care  Dysphagia Recommendations  Diet recommendations Dysphagia 1 (puree);Thin liquid  Liquids provided via Cup  Medication Administration Whole meds with puree  Supervision Patient able to self feed  Compensations Small sips/bites  Postural Changes and/or Swallow Maneuvers Seated upright 90 degrees  General Recommendations  Oral Care Recommendations Oral care BID  Follow up Recommendations None  Progression Toward Goals  Progression toward goals Progressing toward goals  Patient/Family Stated Goal Pt wants to remain on dysphagi 1 diet for now, feels like egg salad was too textured.  SLP Time Calculation  SLP Start Time (ACUTE ONLY) 1130  SLP Stop Time (ACUTE ONLY) 1230  SLP Time Calculation (min) (ACUTE ONLY) 60 min   Late Entry note copy forwarded from 08/09/15 14:15 secondary to unable to complete pended note. This note was created and the treatment was performed by Donzetta Starch, SLP.   Orinda Kenner, Rew, CCC-SLP 08/11/2015 8:04 AM

## 2015-08-13 DIAGNOSIS — I509 Heart failure, unspecified: Secondary | ICD-10-CM | POA: Diagnosis not present

## 2015-08-13 DIAGNOSIS — I10 Essential (primary) hypertension: Secondary | ICD-10-CM | POA: Diagnosis not present

## 2015-08-13 DIAGNOSIS — Z7901 Long term (current) use of anticoagulants: Secondary | ICD-10-CM | POA: Diagnosis not present

## 2015-08-13 DIAGNOSIS — K219 Gastro-esophageal reflux disease without esophagitis: Secondary | ICD-10-CM | POA: Diagnosis not present

## 2015-08-13 DIAGNOSIS — J449 Chronic obstructive pulmonary disease, unspecified: Secondary | ICD-10-CM | POA: Diagnosis not present

## 2015-08-13 DIAGNOSIS — Z8546 Personal history of malignant neoplasm of prostate: Secondary | ICD-10-CM | POA: Diagnosis not present

## 2015-08-13 DIAGNOSIS — I4891 Unspecified atrial fibrillation: Secondary | ICD-10-CM | POA: Diagnosis not present

## 2015-08-13 DIAGNOSIS — M1990 Unspecified osteoarthritis, unspecified site: Secondary | ICD-10-CM | POA: Diagnosis not present

## 2015-08-14 DIAGNOSIS — Z7901 Long term (current) use of anticoagulants: Secondary | ICD-10-CM | POA: Diagnosis not present

## 2015-08-14 DIAGNOSIS — I509 Heart failure, unspecified: Secondary | ICD-10-CM | POA: Diagnosis not present

## 2015-08-14 DIAGNOSIS — M1990 Unspecified osteoarthritis, unspecified site: Secondary | ICD-10-CM | POA: Diagnosis not present

## 2015-08-14 DIAGNOSIS — K219 Gastro-esophageal reflux disease without esophagitis: Secondary | ICD-10-CM | POA: Diagnosis not present

## 2015-08-14 DIAGNOSIS — I10 Essential (primary) hypertension: Secondary | ICD-10-CM | POA: Diagnosis not present

## 2015-08-14 DIAGNOSIS — I4891 Unspecified atrial fibrillation: Secondary | ICD-10-CM | POA: Diagnosis not present

## 2015-08-14 DIAGNOSIS — J449 Chronic obstructive pulmonary disease, unspecified: Secondary | ICD-10-CM | POA: Diagnosis not present

## 2015-08-14 DIAGNOSIS — Z8546 Personal history of malignant neoplasm of prostate: Secondary | ICD-10-CM | POA: Diagnosis not present

## 2015-08-18 DIAGNOSIS — C9290 Myeloid leukemia, unspecified, not having achieved remission: Secondary | ICD-10-CM | POA: Diagnosis not present

## 2015-08-18 DIAGNOSIS — Z79899 Other long term (current) drug therapy: Secondary | ICD-10-CM | POA: Diagnosis not present

## 2015-08-18 DIAGNOSIS — J189 Pneumonia, unspecified organism: Secondary | ICD-10-CM | POA: Diagnosis not present

## 2015-08-18 DIAGNOSIS — M0579 Rheumatoid arthritis with rheumatoid factor of multiple sites without organ or systems involvement: Secondary | ICD-10-CM | POA: Diagnosis not present

## 2015-08-18 DIAGNOSIS — A419 Sepsis, unspecified organism: Secondary | ICD-10-CM | POA: Diagnosis not present

## 2015-08-18 DIAGNOSIS — J449 Chronic obstructive pulmonary disease, unspecified: Secondary | ICD-10-CM | POA: Diagnosis not present

## 2015-08-19 DIAGNOSIS — Z8546 Personal history of malignant neoplasm of prostate: Secondary | ICD-10-CM | POA: Diagnosis not present

## 2015-08-19 DIAGNOSIS — Z7901 Long term (current) use of anticoagulants: Secondary | ICD-10-CM | POA: Diagnosis not present

## 2015-08-19 DIAGNOSIS — J449 Chronic obstructive pulmonary disease, unspecified: Secondary | ICD-10-CM | POA: Diagnosis not present

## 2015-08-19 DIAGNOSIS — K219 Gastro-esophageal reflux disease without esophagitis: Secondary | ICD-10-CM | POA: Diagnosis not present

## 2015-08-19 DIAGNOSIS — I4891 Unspecified atrial fibrillation: Secondary | ICD-10-CM | POA: Diagnosis not present

## 2015-08-19 DIAGNOSIS — M1990 Unspecified osteoarthritis, unspecified site: Secondary | ICD-10-CM | POA: Diagnosis not present

## 2015-08-19 DIAGNOSIS — I509 Heart failure, unspecified: Secondary | ICD-10-CM | POA: Diagnosis not present

## 2015-08-19 DIAGNOSIS — I10 Essential (primary) hypertension: Secondary | ICD-10-CM | POA: Diagnosis not present

## 2015-08-20 DIAGNOSIS — R0602 Shortness of breath: Secondary | ICD-10-CM | POA: Diagnosis not present

## 2015-08-25 ENCOUNTER — Ambulatory Visit
Admission: RE | Admit: 2015-08-25 | Discharge: 2015-08-25 | Disposition: A | Discharge status: Home / Self Care | Payer: Medicare Other | Source: Ambulatory Visit | Attending: Physician Assistant | Admitting: Physician Assistant

## 2015-08-25 ENCOUNTER — Other Ambulatory Visit: Payer: Self-pay | Admitting: Physician Assistant

## 2015-08-25 DIAGNOSIS — R7309 Other abnormal glucose: Secondary | ICD-10-CM | POA: Diagnosis not present

## 2015-08-25 DIAGNOSIS — R05 Cough: Secondary | ICD-10-CM

## 2015-08-25 DIAGNOSIS — D638 Anemia in other chronic diseases classified elsewhere: Secondary | ICD-10-CM | POA: Diagnosis not present

## 2015-08-25 DIAGNOSIS — R059 Cough, unspecified: Secondary | ICD-10-CM

## 2015-09-02 DIAGNOSIS — I482 Chronic atrial fibrillation: Secondary | ICD-10-CM | POA: Diagnosis not present

## 2015-09-09 DIAGNOSIS — K219 Gastro-esophageal reflux disease without esophagitis: Secondary | ICD-10-CM | POA: Diagnosis not present

## 2015-09-09 DIAGNOSIS — I4891 Unspecified atrial fibrillation: Secondary | ICD-10-CM | POA: Diagnosis not present

## 2015-09-09 DIAGNOSIS — J449 Chronic obstructive pulmonary disease, unspecified: Secondary | ICD-10-CM | POA: Diagnosis not present

## 2015-09-09 DIAGNOSIS — M1990 Unspecified osteoarthritis, unspecified site: Secondary | ICD-10-CM | POA: Diagnosis not present

## 2015-09-09 DIAGNOSIS — Z8546 Personal history of malignant neoplasm of prostate: Secondary | ICD-10-CM | POA: Diagnosis not present

## 2015-09-09 DIAGNOSIS — I10 Essential (primary) hypertension: Secondary | ICD-10-CM | POA: Diagnosis not present

## 2015-09-09 DIAGNOSIS — Z7901 Long term (current) use of anticoagulants: Secondary | ICD-10-CM | POA: Diagnosis not present

## 2015-09-09 DIAGNOSIS — I509 Heart failure, unspecified: Secondary | ICD-10-CM | POA: Diagnosis not present

## 2015-09-15 DIAGNOSIS — J449 Chronic obstructive pulmonary disease, unspecified: Secondary | ICD-10-CM | POA: Diagnosis not present

## 2015-09-15 DIAGNOSIS — J189 Pneumonia, unspecified organism: Secondary | ICD-10-CM | POA: Diagnosis not present

## 2015-09-16 ENCOUNTER — Inpatient Hospital Stay: Payer: Medicare Other | Attending: Internal Medicine

## 2015-09-16 DIAGNOSIS — J449 Chronic obstructive pulmonary disease, unspecified: Secondary | ICD-10-CM | POA: Diagnosis not present

## 2015-09-16 DIAGNOSIS — I482 Chronic atrial fibrillation: Secondary | ICD-10-CM | POA: Diagnosis not present

## 2015-09-16 DIAGNOSIS — I519 Heart disease, unspecified: Secondary | ICD-10-CM | POA: Diagnosis not present

## 2015-09-24 DIAGNOSIS — I509 Heart failure, unspecified: Secondary | ICD-10-CM | POA: Diagnosis not present

## 2015-09-24 DIAGNOSIS — I10 Essential (primary) hypertension: Secondary | ICD-10-CM | POA: Diagnosis not present

## 2015-09-24 DIAGNOSIS — K219 Gastro-esophageal reflux disease without esophagitis: Secondary | ICD-10-CM | POA: Diagnosis not present

## 2015-09-24 DIAGNOSIS — Z7901 Long term (current) use of anticoagulants: Secondary | ICD-10-CM | POA: Diagnosis not present

## 2015-09-24 DIAGNOSIS — I4891 Unspecified atrial fibrillation: Secondary | ICD-10-CM | POA: Diagnosis not present

## 2015-09-24 DIAGNOSIS — M1990 Unspecified osteoarthritis, unspecified site: Secondary | ICD-10-CM | POA: Diagnosis not present

## 2015-09-24 DIAGNOSIS — Z8546 Personal history of malignant neoplasm of prostate: Secondary | ICD-10-CM | POA: Diagnosis not present

## 2015-09-24 DIAGNOSIS — J449 Chronic obstructive pulmonary disease, unspecified: Secondary | ICD-10-CM | POA: Diagnosis not present

## 2015-09-30 DIAGNOSIS — I482 Chronic atrial fibrillation: Secondary | ICD-10-CM | POA: Diagnosis not present

## 2015-10-08 DIAGNOSIS — Z79899 Other long term (current) drug therapy: Secondary | ICD-10-CM | POA: Diagnosis not present

## 2015-10-08 DIAGNOSIS — M0579 Rheumatoid arthritis with rheumatoid factor of multiple sites without organ or systems involvement: Secondary | ICD-10-CM | POA: Diagnosis not present

## 2015-10-15 DIAGNOSIS — I482 Chronic atrial fibrillation: Secondary | ICD-10-CM | POA: Diagnosis not present

## 2015-10-15 DIAGNOSIS — Z79899 Other long term (current) drug therapy: Secondary | ICD-10-CM | POA: Diagnosis not present

## 2015-10-15 DIAGNOSIS — C9110 Chronic lymphocytic leukemia of B-cell type not having achieved remission: Secondary | ICD-10-CM | POA: Diagnosis not present

## 2015-10-15 DIAGNOSIS — M0579 Rheumatoid arthritis with rheumatoid factor of multiple sites without organ or systems involvement: Secondary | ICD-10-CM | POA: Diagnosis not present

## 2015-10-16 ENCOUNTER — Other Ambulatory Visit: Payer: Self-pay | Admitting: Family Medicine

## 2015-10-21 DIAGNOSIS — J449 Chronic obstructive pulmonary disease, unspecified: Secondary | ICD-10-CM | POA: Diagnosis not present

## 2015-10-21 DIAGNOSIS — J069 Acute upper respiratory infection, unspecified: Secondary | ICD-10-CM | POA: Diagnosis not present

## 2015-10-21 DIAGNOSIS — I482 Chronic atrial fibrillation: Secondary | ICD-10-CM | POA: Diagnosis not present

## 2015-10-21 DIAGNOSIS — J309 Allergic rhinitis, unspecified: Secondary | ICD-10-CM | POA: Diagnosis not present

## 2015-10-23 DIAGNOSIS — J449 Chronic obstructive pulmonary disease, unspecified: Secondary | ICD-10-CM | POA: Diagnosis not present

## 2015-10-23 DIAGNOSIS — I10 Essential (primary) hypertension: Secondary | ICD-10-CM | POA: Diagnosis not present

## 2015-10-23 DIAGNOSIS — I4891 Unspecified atrial fibrillation: Secondary | ICD-10-CM | POA: Diagnosis not present

## 2015-10-23 DIAGNOSIS — I509 Heart failure, unspecified: Secondary | ICD-10-CM | POA: Diagnosis not present

## 2015-10-28 DIAGNOSIS — I482 Chronic atrial fibrillation: Secondary | ICD-10-CM | POA: Diagnosis not present

## 2015-11-11 ENCOUNTER — Other Ambulatory Visit: Payer: Self-pay | Admitting: *Deleted

## 2015-11-11 ENCOUNTER — Inpatient Hospital Stay: Payer: Medicare Other

## 2015-11-11 ENCOUNTER — Inpatient Hospital Stay: Payer: Medicare Other | Attending: Internal Medicine

## 2015-11-11 ENCOUNTER — Ambulatory Visit: Payer: Medicare Other | Admitting: Internal Medicine

## 2015-11-11 DIAGNOSIS — D509 Iron deficiency anemia, unspecified: Secondary | ICD-10-CM

## 2015-11-18 DIAGNOSIS — I482 Chronic atrial fibrillation: Secondary | ICD-10-CM | POA: Diagnosis not present

## 2015-11-18 DIAGNOSIS — I10 Essential (primary) hypertension: Secondary | ICD-10-CM | POA: Diagnosis not present

## 2015-11-18 DIAGNOSIS — C9290 Myeloid leukemia, unspecified, not having achieved remission: Secondary | ICD-10-CM | POA: Diagnosis not present

## 2015-11-18 DIAGNOSIS — J449 Chronic obstructive pulmonary disease, unspecified: Secondary | ICD-10-CM | POA: Diagnosis not present

## 2015-11-25 DIAGNOSIS — I482 Chronic atrial fibrillation: Secondary | ICD-10-CM | POA: Diagnosis not present

## 2015-12-02 ENCOUNTER — Ambulatory Visit: Payer: Self-pay

## 2015-12-23 DIAGNOSIS — I482 Chronic atrial fibrillation: Secondary | ICD-10-CM | POA: Diagnosis not present

## 2016-01-06 ENCOUNTER — Ambulatory Visit: Payer: Self-pay

## 2016-01-13 DIAGNOSIS — I482 Chronic atrial fibrillation: Secondary | ICD-10-CM | POA: Diagnosis not present

## 2016-01-13 DIAGNOSIS — Z79899 Other long term (current) drug therapy: Secondary | ICD-10-CM | POA: Diagnosis not present

## 2016-01-13 DIAGNOSIS — M0579 Rheumatoid arthritis with rheumatoid factor of multiple sites without organ or systems involvement: Secondary | ICD-10-CM | POA: Diagnosis not present

## 2016-01-14 ENCOUNTER — Inpatient Hospital Stay: Payer: Medicare Other

## 2016-01-14 ENCOUNTER — Inpatient Hospital Stay: Payer: Medicare Other | Attending: Family Medicine | Admitting: Family Medicine

## 2016-01-14 ENCOUNTER — Encounter: Payer: Self-pay | Admitting: Family Medicine

## 2016-01-14 VITALS — BP 121/75 | HR 69 | Temp 98.2°F | Wt 146.2 lb

## 2016-01-14 DIAGNOSIS — Z8546 Personal history of malignant neoplasm of prostate: Secondary | ICD-10-CM | POA: Diagnosis not present

## 2016-01-14 DIAGNOSIS — R5383 Other fatigue: Secondary | ICD-10-CM | POA: Insufficient documentation

## 2016-01-14 DIAGNOSIS — Z7901 Long term (current) use of anticoagulants: Secondary | ICD-10-CM | POA: Diagnosis not present

## 2016-01-14 DIAGNOSIS — E785 Hyperlipidemia, unspecified: Secondary | ICD-10-CM | POA: Diagnosis not present

## 2016-01-14 DIAGNOSIS — D509 Iron deficiency anemia, unspecified: Secondary | ICD-10-CM | POA: Insufficient documentation

## 2016-01-14 DIAGNOSIS — D720 Genetic anomalies of leukocytes: Secondary | ICD-10-CM

## 2016-01-14 DIAGNOSIS — J449 Chronic obstructive pulmonary disease, unspecified: Secondary | ICD-10-CM | POA: Insufficient documentation

## 2016-01-14 DIAGNOSIS — K219 Gastro-esophageal reflux disease without esophagitis: Secondary | ICD-10-CM | POA: Diagnosis not present

## 2016-01-14 DIAGNOSIS — Z79899 Other long term (current) drug therapy: Secondary | ICD-10-CM

## 2016-01-14 DIAGNOSIS — I509 Heart failure, unspecified: Secondary | ICD-10-CM | POA: Diagnosis not present

## 2016-01-14 DIAGNOSIS — Z87891 Personal history of nicotine dependence: Secondary | ICD-10-CM | POA: Diagnosis not present

## 2016-01-14 DIAGNOSIS — M069 Rheumatoid arthritis, unspecified: Secondary | ICD-10-CM | POA: Diagnosis not present

## 2016-01-14 DIAGNOSIS — D72829 Elevated white blood cell count, unspecified: Secondary | ICD-10-CM

## 2016-01-14 DIAGNOSIS — I10 Essential (primary) hypertension: Secondary | ICD-10-CM | POA: Insufficient documentation

## 2016-01-14 DIAGNOSIS — I4891 Unspecified atrial fibrillation: Secondary | ICD-10-CM | POA: Insufficient documentation

## 2016-01-14 HISTORY — DX: Elevated white blood cell count, unspecified: D72.829

## 2016-01-14 LAB — LACTATE DEHYDROGENASE: LDH: 154 U/L (ref 98–192)

## 2016-01-14 NOTE — Progress Notes (Signed)
Thomas Townsend  [phone]  [phone]) [phone]     Thomas Townsend DOB: Jan 13, 1931  MR#: [id number]  FYB#:[id number]  Patient Care Team: Allyne Gee, MD as PCP - General (Internal Medicine)  CHIEF COMPLAINT:  Chief Complaint  Patient presents with  . IDA (iron deficiency anemia)  . Leucocytosis    INTERVAL HISTORY:  Patient is here for continued follow-up regarding persistent leukocytosis with neutrophilia and monocytosis as well as iron deficiency anemia. Patient overall reports feeling fairly well except for some chronic fatigue. Patient was previously followed by Dr. Ma Hillock and has last seen him in September 2016. Patient is currently on a very low-dose of prednisone 5 mg tablets that he takes once a day along with his methotrexate for rheumatoid arthritis.  REVIEW OF SYSTEMS:   Review of Systems  Constitutional: Positive for malaise/fatigue. Negative for fever, chills, weight loss and diaphoresis.  HENT: Negative.   Eyes: Negative.   Respiratory: Negative for cough, hemoptysis, sputum production, shortness of breath and wheezing.   Cardiovascular: Negative for chest pain, palpitations, orthopnea, claudication, leg swelling and PND.  Gastrointestinal: Negative for heartburn, nausea, vomiting, abdominal pain, diarrhea, constipation, blood in stool and melena.  Genitourinary: Negative.   Musculoskeletal: Negative.   Skin: Negative.   Neurological: Negative for dizziness, tingling, focal weakness, seizures and weakness.  Endo/Heme/Allergies: Does not bruise/bleed easily.  Psychiatric/Behavioral: Negative for depression. The patient is not nervous/anxious and does not have insomnia.     As per HPI. Otherwise, a complete review of systems is negatve.  ONCOLOGY HISTORY: Persistent Leukocytosis with Neutrophilia and Monocytosis - ? 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   Workup done on 01/31/12: Hb 13.1, WBC 14,400 with ANC of  10,500, monocytes of 1700, platelets of 226,000, retic count of 0.0448.   Ultrasound negative for hepatosplenomegaly.  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 Bone marrow biopsy 03/01/12 - no definitive features of a Mediport neoplasia including CMML, normal cytogenetics (6 XY. Normocellular to focally mildly hypocellular marrow for age (30-50%) with trilineage hematopoiesis, no overt monocytosis (6% by morphologic differential). Storage iron present. Flow study unremarkable.  LAP score, LDH, JAK2 V617F mutation, and BCR-ABL study all unremarkable.  PAST MEDICAL HISTORY: Past Medical History  Diagnosis Date  . COPD (chronic obstructive pulmonary disease) ([place])   . Atrial fibrillation ([place])   . Prostate cancer ([place])   . GERD (gastroesophageal reflux disease)   . CHF (congestive heart failure) ([place])   . Dysrhythmia   . Arthritis   . Hypertension   . Hyperlipemia   . Leukocytosis 01/14/2016    PAST SURGICAL HISTORY: Past Surgical History  Procedure Laterality Date  . Cholecystectomy    . Colonoscopy with propofol N/A 04/04/2015    Procedure: COLONOSCOPY WITH PROPOFOL;  Surgeon: Manya Silvas, MD;  Location[facility] ENDOSCOPY;  Service: Endoscopy;  Laterality: N/A;  . Esophagogastroduodenoscopy N/A 04/04/2015    Procedure: ESOPHAGOGASTRODUODENOSCOPY (EGD);  Surgeon: Manya Silvas, MD;  Location: [facility] ENDOSCOPY;  Service: Endoscopy;  Laterality: N/A;    FAMILY HISTORY Family History  Problem Relation Age of Onset  . Hypertension Other     GYNECOLOGIC HISTORY:  No LMP for male patient.     ADVANCED DIRECTIVES:    HEALTH MAINTENANCE: Social History  Substance Use Topics  . Smoking status: Former Smoker -- 20 years    Types: Cigarettes  . Smokeless tobacco: Not on file  . Alcohol Use: 0.0 oz/week    0 Standard drinks  or equivalent per week     Comment: Wine every once in a while     No Known Allergies  Current  Outpatient Prescriptions  Medication Sig Dispense Refill  . BREO ELLIPTA 100-25 MCG/INH AEPB Inhale 1 puff into the lungs every morning.    . [employer] ALLERGY RELIEF-D 5-120 MG tablet TAKE 1 TABLET BY MOUTH TWICE DAILY FOR SINUS CONGESTION  0  . digoxin (LANOXIN) 0.125 MG tablet     . ferrous sulfate 325 (65 FE) MG tablet Take 325 mg by mouth every morning.     . fluticasone furoate-vilanterol (BREO ELLIPTA) 100-25 MCG/INH AEPB     . folic acid (FOLVITE) 1 MG tablet Take 1 mg by mouth daily.   11  . furosemide (LASIX) 20 MG tablet Take 20 mg by mouth daily. In the morning    . methotrexate (RHEUMATREX) 2.5 MG tablet TAKE 6 TABLETS (15 MG TOTAL) BY MOUTH EVERY 7 (SEVEN) DAYS.  4  . montelukast (SINGULAIR) 10 MG tablet Take 1 tablet by mouth daily.     . Multiple Vitamins-Minerals (PRESERVISION AREDS PO) Take 1 tablet by mouth daily.     . prednisoLONE 5 MG TABS tablet Take 5 mg by mouth daily.    Marland Kitchen warfarin (COUMADIN) 4 MG tablet Take 4 mg by mouth daily.     No current facility-administered medications for this visit.    OBJECTIVE: BP 121/75 mmHg  Pulse 69  Temp(Src) 98.2 F (36.8 C)  Wt 146 lb 2.6 oz (66.3 kg)   Body mass index is 22.89 kg/(m^2).    ECOG FS:1 - Symptomatic but completely ambulatory  General: Well-developed, well-nourished, no acute distress. Eyes: Pink conjunctiva, anicteric sclera. HEENT: Normocephalic, moist mucous membranes, clear oropharnyx. Lungs: Clear to auscultation bilaterally. Heart: Regular rate and rhythm. No rubs, murmurs, or gallops. Abdomen: Soft, nontender, nondistended. No organomegaly noted, normoactive bowel sounds. Musculoskeletal: No edema, cyanosis, or clubbing. Neuro: Alert, answering all questions appropriately. Cranial nerves grossly intact. Skin: No rashes or petechiae noted. Psych: Normal affect. Lymphatics: No cervical, clavicular LAD.   LAB RESULTS:  No visits with results within 3 Day(s) from this visit. Latest known visit with  results is:  Admission on 08/06/2015, Discharged on 08/11/2015  No results displayed because visit has over 200 results.      STUDIES: No results found.  ASSESSMENT:  Persistent leukocytosis with neutrophilia and monocytosis.  PLAN:  1. Persistent leukocytosis. Patient was last seen by Dr. Ma Hillock in September 2016, during his previous follow-ups there was concern as to leukocytosis. Secondary to prednisone use versus underlying myeloproliferative disorder or combination of the 2. His most recent bone marrow biopsy was from 2013 that showed no definitive features suggesting chronic myelomonocytic leukemia. We'll obtain and review lab work for today. Discussed with Dr. Rogue Bussing and he will see patient in 6 weeks with continued lab work for comparison. 2. Anemia. Patient had labs performed with primary care provider yesterday, see care everywhere for results.  Patient expressed understanding and was in agreement with this plan. He also understands that He can call clinic at any time with any questions, concerns, or complaints.   Dr. Rogue Bussing was available for consultation and review of plan of care for this patient.  Evlyn Kanner, NP   01/14/2016 12:28 PM

## 2016-01-15 DIAGNOSIS — I482 Chronic atrial fibrillation: Secondary | ICD-10-CM | POA: Diagnosis not present

## 2016-01-15 DIAGNOSIS — R0602 Shortness of breath: Secondary | ICD-10-CM | POA: Diagnosis not present

## 2016-01-15 DIAGNOSIS — J449 Chronic obstructive pulmonary disease, unspecified: Secondary | ICD-10-CM | POA: Diagnosis not present

## 2016-02-10 DIAGNOSIS — I482 Chronic atrial fibrillation: Secondary | ICD-10-CM | POA: Diagnosis not present

## 2016-02-17 ENCOUNTER — Ambulatory Visit (INDEPENDENT_AMBULATORY_CARE_PROVIDER_SITE_OTHER): Payer: Medicare Other | Admitting: Ophthalmology

## 2016-02-17 DIAGNOSIS — M0579 Rheumatoid arthritis with rheumatoid factor of multiple sites without organ or systems involvement: Secondary | ICD-10-CM | POA: Diagnosis not present

## 2016-02-17 DIAGNOSIS — I482 Chronic atrial fibrillation: Secondary | ICD-10-CM | POA: Diagnosis not present

## 2016-02-17 DIAGNOSIS — C9110 Chronic lymphocytic leukemia of B-cell type not having achieved remission: Secondary | ICD-10-CM | POA: Diagnosis not present

## 2016-02-25 ENCOUNTER — Inpatient Hospital Stay: Payer: Medicare Other | Attending: Internal Medicine | Admitting: Internal Medicine

## 2016-02-25 ENCOUNTER — Inpatient Hospital Stay: Payer: Medicare Other

## 2016-02-25 VITALS — BP 107/68 | HR 84 | Temp 96.4°F | Resp 19 | Ht 67.0 in | Wt 149.7 lb

## 2016-02-25 DIAGNOSIS — I4891 Unspecified atrial fibrillation: Secondary | ICD-10-CM | POA: Diagnosis not present

## 2016-02-25 DIAGNOSIS — M129 Arthropathy, unspecified: Secondary | ICD-10-CM | POA: Diagnosis not present

## 2016-02-25 DIAGNOSIS — I10 Essential (primary) hypertension: Secondary | ICD-10-CM | POA: Diagnosis not present

## 2016-02-25 DIAGNOSIS — Z87891 Personal history of nicotine dependence: Secondary | ICD-10-CM

## 2016-02-25 DIAGNOSIS — D72829 Elevated white blood cell count, unspecified: Secondary | ICD-10-CM | POA: Diagnosis not present

## 2016-02-25 DIAGNOSIS — Z8546 Personal history of malignant neoplasm of prostate: Secondary | ICD-10-CM

## 2016-02-25 DIAGNOSIS — Z79899 Other long term (current) drug therapy: Secondary | ICD-10-CM | POA: Diagnosis not present

## 2016-02-25 DIAGNOSIS — E785 Hyperlipidemia, unspecified: Secondary | ICD-10-CM

## 2016-02-25 DIAGNOSIS — D509 Iron deficiency anemia, unspecified: Secondary | ICD-10-CM

## 2016-02-25 DIAGNOSIS — I509 Heart failure, unspecified: Secondary | ICD-10-CM | POA: Diagnosis not present

## 2016-02-25 DIAGNOSIS — K219 Gastro-esophageal reflux disease without esophagitis: Secondary | ICD-10-CM

## 2016-02-25 DIAGNOSIS — R5383 Other fatigue: Secondary | ICD-10-CM | POA: Diagnosis not present

## 2016-02-25 DIAGNOSIS — Z7901 Long term (current) use of anticoagulants: Secondary | ICD-10-CM | POA: Diagnosis not present

## 2016-02-25 DIAGNOSIS — J449 Chronic obstructive pulmonary disease, unspecified: Secondary | ICD-10-CM

## 2016-02-25 DIAGNOSIS — D649 Anemia, unspecified: Secondary | ICD-10-CM | POA: Diagnosis not present

## 2016-02-25 LAB — CBC WITH DIFFERENTIAL/PLATELET
BASOS ABS: 0 10*3/uL (ref 0–0.1)
Basophils Relative: 0 %
EOS PCT: 1 %
Eosinophils Absolute: 0.2 10*3/uL (ref 0–0.7)
HEMATOCRIT: 29.8 % — AB (ref 40.0–52.0)
HEMOGLOBIN: 9.9 g/dL — AB (ref 13.0–18.0)
LYMPHS PCT: 3 %
Lymphs Abs: 0.6 10*3/uL — ABNORMAL LOW (ref 1.0–3.6)
MCH: 31.8 pg (ref 26.0–34.0)
MCHC: 33.2 g/dL (ref 32.0–36.0)
MCV: 95.6 fL (ref 80.0–100.0)
MONO ABS: 2.1 10*3/uL — AB (ref 0.2–1.0)
MONOS PCT: 10 %
NEUTROS ABS: 18.2 10*3/uL — AB (ref 1.4–6.5)
Neutrophils Relative %: 86 %
Platelets: 246 10*3/uL (ref 150–440)
RBC: 3.12 MIL/uL — ABNORMAL LOW (ref 4.40–5.90)
RDW: 17.6 % — ABNORMAL HIGH (ref 11.5–14.5)
WBC: 21.2 10*3/uL — ABNORMAL HIGH (ref 3.8–10.6)

## 2016-02-25 LAB — IRON AND TIBC
Iron: 84 ug/dL (ref 45–182)
Saturation Ratios: 44 % — ABNORMAL HIGH (ref 17.9–39.5)
TIBC: 191 ug/dL — AB (ref 250–450)
UIBC: 107 ug/dL

## 2016-02-25 LAB — COMPREHENSIVE METABOLIC PANEL
ALBUMIN: 3.7 g/dL (ref 3.5–5.0)
ALK PHOS: 107 U/L (ref 38–126)
ALT: 12 U/L — AB (ref 17–63)
AST: 19 U/L (ref 15–41)
Anion gap: 7 (ref 5–15)
BILIRUBIN TOTAL: 0.6 mg/dL (ref 0.3–1.2)
BUN: 21 mg/dL — AB (ref 6–20)
CALCIUM: 8.4 mg/dL — AB (ref 8.9–10.3)
CO2: 23 mmol/L (ref 22–32)
CREATININE: 1.1 mg/dL (ref 0.61–1.24)
Chloride: 106 mmol/L (ref 101–111)
GFR calc Af Amer: >60 mL/min (ref >60)
GFR calc non Af Amer: >60 mL/min (ref >60)
GLUCOSE: 124 mg/dL — AB (ref 65–99)
POTASSIUM: 3.6 mmol/L (ref 3.5–5.1)
Sodium: 136 mmol/L (ref 135–145)
TOTAL PROTEIN: 6.9 g/dL (ref 6.5–8.1)

## 2016-02-25 LAB — FERRITIN: Ferritin: 292 ng/mL (ref 24–336)

## 2016-02-25 LAB — LACTATE DEHYDROGENASE: LDH: 188 U/L (ref 98–192)

## 2016-02-25 NOTE — Progress Notes (Signed)
Pt reports mild fatigue believes related to being old and SOB on exertion.  IN general feels good all the time

## 2016-02-25 NOTE — Progress Notes (Signed)
[facility] OFFICE PROGRESS NOTE  Patient Care Team: Allyne Gee, MD as PCP - General (Internal Medicine)   SUMMARY OF HEMATOLOGIC/ONCOLOGIC HISTORY:  # Leucocytosis- 20-30K [Dr.Pandit-BMBx 2013-No definitive features of a Myeloproliferative neoplasia including CMML, normal cytogenetics (56 XY. Normocellular to focally mildly hypocellular marrow for age (30-50%) with trilineage hematopoiesis, no overt monocytosis ; Flow study unremarkable. Peripheral blood flow- suggestive of chronic myelomonocytic leukemia]; JAK2/Bcr-Abl-NEG.2013- [place]- No hepato-splenomegaly.   # IDA- ? Etiology.  # RA on MXT; prednisone 51m discon  INTERVAL HISTORY:  This is my first interaction with the patient since I joined the practice September 2016. I reviewed the patient's prior charts/pertinent labs/imaging in detail; findings are summarized above.   A very pleasant 82year old male patient with a prior history of long-standing leukocytosis and mild anemia is here for follow-up. Patient denies any unusual fatigue; he has mild chronic fatigue. This is not getting any worse. His appetite is good. No weight loss. No night sweats no fevers. Denies any chest pain shortness of breath or cough.  REVIEW OF SYSTEMS:  A complete 10 point review of system is done which is negative except mentioned above/history of present illness.   PAST MEDICAL HISTORY :  Past Medical History  Diagnosis Date  . COPD ([place] pulmonary disease) [place]   . [place]   . Prostate cancer ([place]   . GERD (gastroesophageal reflux disease)   . CHF (congestive heart failure) ([place]   . Dysrhythmia   . Arthritis   . Hypertension   . Hyperlipemia   . Leukocytosis 01/14/2016    PAST SURGICAL HISTORY :   Past Surgical History  Procedure Laterality Date  . Cholecystectomy    . Colonoscopy with propofol N/A 04/04/2015    Procedure: COLONOSCOPY WITH PROPOFOL;  Surgeon: RManya Silvas MD;  [facility];  Service: Endoscopy;  Laterality: N/A;  . Esophagogastroduodenoscopy N/A 04/04/2015    Procedure: ESOPHAGOGASTRODUODENOSCOPY (EGD);  Surgeon: RManya Silvas MD;  [facility];  Service: Endoscopy;  Laterality: N/A;    FAMILY HISTORY :   Family History  Problem Relation Age of Onset  . Hypertension Other     SOCIAL HISTORY:   Social History  Substance Use Topics  . Smoking status: Former Smoker -- 20 years    Types: Cigarettes  . Smokeless tobacco: Not on file  . Alcohol Use: 0.0 oz/week    0 Standard drinks or equivalent per week     Comment: Wine every once in a while    ALLERGIES:  has No Known Allergies.  MEDICATIONS:  Current Outpatient Prescriptions  Medication Sig Dispense Refill  . BREO ELLIPTA 100-25 MCG/INH AEPB Inhale 1 puff into the lungs every morning.    . digoxin (LANOXIN) 0.125 MG tablet     . ferrous sulfate 325 (65 FE) MG tablet Take 325 mg by mouth every morning. Reported on 59/51/8841   . folic acid (FOLVITE) 1 MG tablet Take 1 mg by mouth daily.   11  . furosemide (LASIX) 20 MG tablet Take 20 mg by mouth daily. In the morning    . methotrexate (RHEUMATREX) 2.5 MG tablet TAKE 6 TABLETS (15 MG TOTAL) BY MOUTH EVERY 7 (SEVEN) DAYS.  4  . montelukast (SINGULAIR) 10 MG tablet Take 1 tablet by mouth daily.     . Multiple Vitamins-Minerals (PRESERVISION AREDS PO) Take 1 tablet by mouth daily.     .Marland Kitchenwarfarin (COUMADIN) 4 MG tablet Take 4 mg  by mouth daily.     No current facility-administered medications for this visit.    PHYSICAL EXAMINATION: ECOG PERFORMANCE STATUS: \  BP 107/68 mmHg  Pulse 84  Temp(Src) 96.4 F (35.8 C) (Tympanic)  Resp 19  Ht '5\' 7"'  (1.702 m)  Wt 149 lb 11.1 oz (67.9 kg)  BMI 23.44 kg/m2  SpO2 95%  Filed Weights   02/25/16 1122  Weight: 149 lb 11.1 oz (67.9 kg)    GENERAL: Well-nourished well-developed; Alert, no distress and comfortable.   Alone. EYES: no pallor or icterus OROPHARYNX: no thrush  or ulceration; dentures.  NECK: supple, no masses felt LYMPH:  no palpable lymphadenopathy in the cervical, axillary or inguinal regions LUNGS: clear to auscultation and  No wheeze or crackles HEART/CVS: regular rate & rhythm and no murmurs; No lower extremity edema ABDOMEN:abdomen soft, non-tender and normal bowel sounds Musculoskeletal:no cyanosis of digits and no clubbing  PSYCH: alert & oriented x 3 with fluent speech NEURO: no focal motor/sensory deficits SKIN:  no rashes or significant lesions  LABORATORY DATA:  I have reviewed the data as listed    Component Value Date/Time   NA 136 02/25/2016 1005   NA 140 01/22/2015 0345   K 3.6 02/25/2016 1005   K 3.7 01/22/2015 0345   CL 106 02/25/2016 1005   CL 106 01/22/2015 0345   CO2 23 02/25/2016 1005   CO2 25 01/22/2015 0345   GLUCOSE 124* 02/25/2016 1005   GLUCOSE 89 01/22/2015 0345   BUN 21* 02/25/2016 1005   BUN 16 01/22/2015 0345   CREATININE 1.10 02/25/2016 1005   CREATININE 0.88 01/22/2015 0345   CALCIUM 8.4* 02/25/2016 1005   CALCIUM 7.7* 01/22/2015 0345   PROT 6.9 02/25/2016 1005   PROT 6.8 01/21/2015 1946   ALBUMIN 3.7 02/25/2016 1005   ALBUMIN 3.3* 01/21/2015 1946   AST 19 02/25/2016 1005   AST 20 01/21/2015 1946   ALT 12* 02/25/2016 1005   ALT 6* 01/21/2015 1946   ALKPHOS 107 02/25/2016 1005   ALKPHOS 86 01/21/2015 1946   BILITOT 0.6 02/25/2016 1005   BILITOT 0.9 01/21/2015 1946   GFRNONAA >60 02/25/2016 1005   GFRNONAA >60 01/22/2015 0345   GFRAA >60 02/25/2016 1005   GFRAA >60 01/22/2015 0345    No results found for: SPEP, UPEP  Lab Results  Component Value Date   WBC 21.2* 02/25/2016   NEUTROABS 18.2* 02/25/2016   HGB 9.9* 02/25/2016   HCT 29.8* 02/25/2016   MCV 95.6 02/25/2016   PLT 246 02/25/2016      Chemistry      Component Value Date/Time   NA 136 02/25/2016 1005   NA 140 01/22/2015 0345   K 3.6 02/25/2016 1005   K 3.7 01/22/2015 0345   CL 106 02/25/2016 1005   CL 106 01/22/2015  0345   CO2 23 02/25/2016 1005   CO2 25 01/22/2015 0345   BUN 21* 02/25/2016 1005   BUN 16 01/22/2015 0345   CREATININE 1.10 02/25/2016 1005   CREATININE 0.88 01/22/2015 0345      Component Value Date/Time   CALCIUM 8.4* 02/25/2016 1005   CALCIUM 7.7* 01/22/2015 0345   ALKPHOS 107 02/25/2016 1005   ALKPHOS 86 01/21/2015 1946   AST 19 02/25/2016 1005   AST 20 01/21/2015 1946   ALT 12* 02/25/2016 1005   ALT 6* 01/21/2015 1946   BILITOT 0.6 02/25/2016 1005   BILITOT 0.9 01/21/2015 1946         ASSESSMENT & PLAN:   #  Anemia hemoglobin 9.9 MCV 95.- Unclear etiology prior history of iron deficiency anemia. Recommend checking iron studies today. Question related to his bone marrow abnormality. However if patient is not iron deficient/ and his hemoglobin continues to drop a repeat bone marrow biopsy would be indicated. No evidence of hemolysis.  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 Previous bone marrow biopsy reviewed. White count is fairly stable- around 21,000 predominant neutrophilia. Monitor for now.    # Repeat CBC in 3 months; follow-up with me in 6 months with CBC CMP and iron studies.  # 25 minutes face-to-face with the patient discussing the above plan of care; more than 50% of time spent on prognosis/ natural history; counseling and coordination.    Cammie Sickle, MD 02/25/2016 11:25 AM

## 2016-02-25 NOTE — Progress Notes (Signed)
Reviewed the iron studies suggestive of iron deficiency. The etiology of mild anemia hemoglobin 9.9 is unclear; question related to underlying primary bone marrow process. For now repeat CBC in approximately 3 months; if getting worse would recommend further evaluation with bone marrow biopsy. Dr.B

## 2016-02-26 ENCOUNTER — Observation Stay
Admission: EM | Admit: 2016-02-26 | Discharge: 2016-02-27 | Disposition: A | Discharge status: Home / Self Care | Payer: Medicare Other | Attending: Internal Medicine | Admitting: Internal Medicine

## 2016-02-26 ENCOUNTER — Other Ambulatory Visit: Payer: Self-pay

## 2016-02-26 ENCOUNTER — Encounter: Payer: Self-pay | Admitting: Emergency Medicine

## 2016-02-26 ENCOUNTER — Telehealth: Payer: Self-pay | Admitting: *Deleted

## 2016-02-26 DIAGNOSIS — I509 Heart failure, unspecified: Secondary | ICD-10-CM | POA: Diagnosis not present

## 2016-02-26 DIAGNOSIS — Z87891 Personal history of nicotine dependence: Secondary | ICD-10-CM | POA: Diagnosis not present

## 2016-02-26 DIAGNOSIS — I350 Nonrheumatic aortic (valve) stenosis: Secondary | ICD-10-CM | POA: Diagnosis not present

## 2016-02-26 DIAGNOSIS — M069 Rheumatoid arthritis, unspecified: Secondary | ICD-10-CM | POA: Insufficient documentation

## 2016-02-26 DIAGNOSIS — R7989 Other specified abnormal findings of blood chemistry: Secondary | ICD-10-CM | POA: Diagnosis not present

## 2016-02-26 DIAGNOSIS — D72829 Elevated white blood cell count, unspecified: Secondary | ICD-10-CM | POA: Diagnosis not present

## 2016-02-26 DIAGNOSIS — I482 Chronic atrial fibrillation: Secondary | ICD-10-CM | POA: Insufficient documentation

## 2016-02-26 DIAGNOSIS — I110 Hypertensive heart disease with heart failure: Secondary | ICD-10-CM | POA: Insufficient documentation

## 2016-02-26 DIAGNOSIS — Z7901 Long term (current) use of anticoagulants: Secondary | ICD-10-CM | POA: Diagnosis not present

## 2016-02-26 DIAGNOSIS — E872 Acidosis: Secondary | ICD-10-CM | POA: Diagnosis not present

## 2016-02-26 DIAGNOSIS — K219 Gastro-esophageal reflux disease without esophagitis: Secondary | ICD-10-CM | POA: Diagnosis not present

## 2016-02-26 DIAGNOSIS — Z8546 Personal history of malignant neoplasm of prostate: Secondary | ICD-10-CM | POA: Diagnosis not present

## 2016-02-26 DIAGNOSIS — E785 Hyperlipidemia, unspecified: Secondary | ICD-10-CM | POA: Diagnosis not present

## 2016-02-26 DIAGNOSIS — R531 Weakness: Secondary | ICD-10-CM | POA: Diagnosis not present

## 2016-02-26 DIAGNOSIS — I272 Other secondary pulmonary hypertension: Secondary | ICD-10-CM | POA: Diagnosis not present

## 2016-02-26 DIAGNOSIS — R778 Other specified abnormalities of plasma proteins: Secondary | ICD-10-CM

## 2016-02-26 DIAGNOSIS — E860 Dehydration: Secondary | ICD-10-CM | POA: Diagnosis not present

## 2016-02-26 DIAGNOSIS — I4891 Unspecified atrial fibrillation: Secondary | ICD-10-CM | POA: Diagnosis not present

## 2016-02-26 DIAGNOSIS — Z9049 Acquired absence of other specified parts of digestive tract: Secondary | ICD-10-CM | POA: Insufficient documentation

## 2016-02-26 DIAGNOSIS — Z8249 Family history of ischemic heart disease and other diseases of the circulatory system: Secondary | ICD-10-CM | POA: Diagnosis not present

## 2016-02-26 DIAGNOSIS — R197 Diarrhea, unspecified: Principal | ICD-10-CM | POA: Insufficient documentation

## 2016-02-26 DIAGNOSIS — G9341 Metabolic encephalopathy: Secondary | ICD-10-CM | POA: Diagnosis not present

## 2016-02-26 DIAGNOSIS — J449 Chronic obstructive pulmonary disease, unspecified: Secondary | ICD-10-CM | POA: Insufficient documentation

## 2016-02-26 LAB — URINALYSIS COMPLETE WITH MICROSCOPIC (ARMC ONLY)
Bilirubin Urine: NEGATIVE
GLUCOSE, UA: NEGATIVE mg/dL
Hgb urine dipstick: NEGATIVE
KETONES UR: NEGATIVE mg/dL
Leukocytes, UA: NEGATIVE
NITRITE: NEGATIVE
Protein, ur: NEGATIVE mg/dL
SPECIFIC GRAVITY, URINE: 1.021 (ref 1.005–1.030)
Squamous Epithelial / LPF: NONE SEEN
pH: 6 (ref 5.0–8.0)

## 2016-02-26 LAB — CBC WITH DIFFERENTIAL/PLATELET
BASOS PCT: 0 %
Basophils Absolute: 0 10*3/uL (ref 0–0.1)
EOS ABS: 0 10*3/uL (ref 0–0.7)
Eosinophils Relative: 0 %
HEMATOCRIT: 27.5 % — AB (ref 40.0–52.0)
HEMOGLOBIN: 9 g/dL — AB (ref 13.0–18.0)
LYMPHS ABS: 1.3 10*3/uL (ref 1.0–3.6)
LYMPHS PCT: 3 %
MCH: 31.4 pg (ref 26.0–34.0)
MCHC: 32.6 g/dL (ref 32.0–36.0)
MCV: 96.4 fL (ref 80.0–100.0)
Monocytes Absolute: 4.8 10*3/uL — ABNORMAL HIGH (ref 0.2–1.0)
Monocytes Relative: 11 %
NEUTROS ABS: 37.8 10*3/uL — AB (ref 1.4–6.5)
Neutrophils Relative %: 86 %
Platelets: 254 10*3/uL (ref 150–440)
RBC: 2.86 MIL/uL — ABNORMAL LOW (ref 4.40–5.90)
RDW: 18.1 % — ABNORMAL HIGH (ref 11.5–14.5)
WBC: 43.9 10*3/uL — ABNORMAL HIGH (ref 3.8–10.6)

## 2016-02-26 LAB — COMPREHENSIVE METABOLIC PANEL
ALK PHOS: 88 U/L (ref 38–126)
ALT: 14 U/L — ABNORMAL LOW (ref 17–63)
ANION GAP: 8 (ref 5–15)
AST: 26 U/L (ref 15–41)
Albumin: 3.4 g/dL — ABNORMAL LOW (ref 3.5–5.0)
BILIRUBIN TOTAL: 0.5 mg/dL (ref 0.3–1.2)
BUN: 22 mg/dL — ABNORMAL HIGH (ref 6–20)
CALCIUM: 8.3 mg/dL — AB (ref 8.9–10.3)
CO2: 24 mmol/L (ref 22–32)
Chloride: 105 mmol/L (ref 101–111)
Creatinine, Ser: 1.14 mg/dL (ref 0.61–1.24)
GFR calc non Af Amer: 58 mL/min — ABNORMAL LOW (ref >60)
Glucose, Bld: 119 mg/dL — ABNORMAL HIGH (ref 65–99)
Potassium: 3.6 mmol/L (ref 3.5–5.1)
SODIUM: 137 mmol/L (ref 135–145)
TOTAL PROTEIN: 6.6 g/dL (ref 6.5–8.1)

## 2016-02-26 LAB — GLUCOSE, CAPILLARY: Glucose-Capillary: 118 mg/dL — ABNORMAL HIGH (ref 65–99)

## 2016-02-26 LAB — TROPONIN I: TROPONIN I: 0.05 ng/mL — AB (ref <0.031)

## 2016-02-26 LAB — PROTIME-INR
INR: 1.66
PROTHROMBIN TIME: 19.6 s — AB (ref 11.4–15.0)

## 2016-02-26 MED ORDER — ONDANSETRON HCL 4 MG/2ML IJ SOLN: 4.0000 mg | Freq: Four times a day (QID) | INTRAMUSCULAR | Status: DC | PRN

## 2016-02-26 MED ORDER — FOLIC ACID 1 MG PO TABS
1.0000 mg | ORAL_TABLET | Freq: Every day | ORAL | Status: DC
  Administered 2016-02-26 – 2016-02-27 (×2): 1 mg via ORAL
  Filled 2016-02-26 (×2): qty 1

## 2016-02-26 MED ORDER — FLUTICASONE FUROATE-VILANTEROL 100-25 MCG/INH IN AEPB
1.0000 | INHALATION_SPRAY | RESPIRATORY_TRACT | Status: DC
  Administered 2016-02-26 – 2016-02-27 (×2): 1 via RESPIRATORY_TRACT
  Filled 2016-02-26: qty 28

## 2016-02-26 MED ORDER — SODIUM CHLORIDE 0.9 % IV SOLN
INTRAVENOUS | Status: DC
  Administered 2016-02-26 – 2016-02-27 (×2): via INTRAVENOUS

## 2016-02-26 MED ORDER — OCUVITE-LUTEIN PO CAPS
ORAL_CAPSULE | Freq: Every day | ORAL | Status: DC
  Administered 2016-02-26 – 2016-02-27 (×2): 1 via ORAL
  Filled 2016-02-26 (×2): qty 1

## 2016-02-26 MED ORDER — WARFARIN SODIUM 4 MG PO TABS
4.0000 mg | ORAL_TABLET | Freq: Once | ORAL | Status: AC
  Administered 2016-02-26: 18:00:00 4 mg via ORAL
  Filled 2016-02-26: qty 1

## 2016-02-26 MED ORDER — DIGOXIN 125 MCG PO TABS
0.1250 mg | ORAL_TABLET | Freq: Every day | ORAL | Status: DC
  Administered 2016-02-26 – 2016-02-27 (×2): 0.125 mg via ORAL
  Filled 2016-02-26 (×2): qty 1

## 2016-02-26 MED ORDER — WARFARIN SODIUM 4 MG PO TABS: 4.0000 mg | ORAL_TABLET | Freq: Every day | ORAL | Status: DC

## 2016-02-26 MED ORDER — FERROUS SULFATE 325 (65 FE) MG PO TABS
325.0000 mg | ORAL_TABLET | Freq: Every morning | ORAL | Status: DC
  Administered 2016-02-26 – 2016-02-27 (×2): 325 mg via ORAL
  Filled 2016-02-26 (×2): qty 1

## 2016-02-26 MED ORDER — DIPHENHYDRAMINE HCL 25 MG PO CAPS: 25.0000 mg | ORAL_CAPSULE | Freq: Every evening | ORAL | Status: DC | PRN

## 2016-02-26 MED ORDER — MONTELUKAST SODIUM 10 MG PO TABS
10.0000 mg | ORAL_TABLET | Freq: Every day | ORAL | Status: DC
  Administered 2016-02-26 – 2016-02-27 (×2): 10 mg via ORAL
  Filled 2016-02-26 (×2): qty 1

## 2016-02-26 MED ORDER — SODIUM CHLORIDE 0.9 % IV SOLN
Freq: Once | INTRAVENOUS | Status: AC
  Administered 2016-02-26: 10:00:00 via INTRAVENOUS

## 2016-02-26 MED ORDER — WARFARIN - PHARMACIST DOSING INPATIENT
Freq: Every day | Status: DC
  Administered 2016-02-26: 18:00:00

## 2016-02-26 NOTE — ED Notes (Signed)
Dr. Jimmye Norman notified of troponin 0.05

## 2016-02-26 NOTE — Telephone Encounter (Signed)
Left vm for patient. Iron lab studies stable at this time. No need to return sooner for any IV iron infusions.  Pt instructed to keep his f/u apts with Dr. Rogue Bussing as scheduled.

## 2016-02-26 NOTE — Telephone Encounter (Signed)
-----   Message from Cammie Sickle, MD sent at 02/25/2016  5:34 PM EDT ----- Please inform patient that his iron levels are okay; he does not need any IV iron at this time recommend.  repeating the blood work in 3 months. Thx

## 2016-02-26 NOTE — ED Notes (Signed)
Pt brought in by family member with c/o diarrhea and chills all night last night.

## 2016-02-26 NOTE — H&P (Signed)
[facility] NAME: Carla Janicke    MR#:  [id number]  DATE OF BIRTH:  1931-09-20  DATE OF ADMISSION:  02/26/2016  PRIMARY CARE PHYSICIAN: Allyne Gee, MD   REQUESTING/REFERRING PHYSICIAN:   CHIEF COMPLAINT:   Chief Complaint  Patient presents with  . Diarrhea  . Chills    HISTORY OF PRESENT ILLNESS: Thomas Townsend  is a 82 y.o. male with a known history of   PAST MEDICAL HISTORY:   Past Medical History  Diagnosis Date  . COPD (chronic obstructive pulmonary disease) ([place])   . Atrial fibrillation ([place])   . Prostate cancer ([place])   . GERD (gastroesophageal reflux disease)   . CHF (congestive heart failure) ([place])   . Dysrhythmia   . Arthritis   . Hypertension   . Hyperlipemia   . Leukocytosis 01/14/2016    PAST SURGICAL HISTORY: Past Surgical History  Procedure Laterality Date  . Cholecystectomy    . Colonoscopy with propofol N/A 04/04/2015    Procedure: COLONOSCOPY WITH PROPOFOL;  Surgeon: Manya Silvas, MD;  Location: [facility] ENDOSCOPY;  Service: Endoscopy;  Laterality: N/A;  . Esophagogastroduodenoscopy N/A 04/04/2015    Procedure: ESOPHAGOGASTRODUODENOSCOPY (EGD);  Surgeon: Manya Silvas, MD;  Location: [facility] ENDOSCOPY;  Service: Endoscopy;  Laterality: N/A;    SOCIAL HISTORY:  Social History  Substance Use Topics  . Smoking status: Former Smoker -- 20 years    Types: Cigarettes  . Smokeless tobacco: Not on file  . Alcohol Use: 0.0 oz/week    0 Standard drinks or equivalent per week     Comment: Wine every once in a while    FAMILY HISTORY:  Family History  Problem Relation Age of Onset  . Hypertension Other     DRUG ALLERGIES: No Known Allergies  REVIEW OF SYSTEMS:   CONSTITUTIONAL: No fever, fatigue or weakness.  EYES: No blurred or double vision.  EARS, NOSE, AND THROAT: No tinnitus or ear pain.  RESPIRATORY: No cough, shortness of breath, wheezing or hemoptysis.  CARDIOVASCULAR: No chest pain,  orthopnea, edema.  GASTROINTESTINAL: No nausea, vomiting, diarrhea or abdominal pain.  GENITOURINARY: No dysuria, hematuria.  ENDOCRINE: No polyuria, nocturia,  HEMATOLOGY: No anemia, easy bruising or bleeding SKIN: No rash or lesion. MUSCULOSKELETAL: No joint pain or arthritis.   NEUROLOGIC: No tingling, numbness, weakness.  PSYCHIATRY: No anxiety or depression.   MEDICATIONS AT HOME:  Prior to Admission medications   Medication Sig Start Date End Date Taking? Authorizing Provider  BREO ELLIPTA 100-25 MCG/INH AEPB Inhale 1 puff into the lungs every morning. 01/28/15   Historical Provider, MD  digoxin (LANOXIN) 0.125 MG tablet  02/22/14   Historical Provider, MD  ferrous sulfate 325 (65 FE) MG tablet Take 325 mg by mouth every morning. Reported on 02/25/2016    Historical Provider, MD  folic acid (FOLVITE) 1 MG tablet Take 1 mg by mouth daily.     Historical Provider, MD  furosemide (LASIX) 20 MG tablet Take 20 mg by mouth daily. In the morning    Historical Provider, MD  methotrexate (RHEUMATREX) 2.5 MG tablet TAKE 6 TABLETS (15 MG TOTAL) BY MOUTH EVERY 7 (SEVEN) DAYS. 12/24/15   Historical Provider, MD  montelukast (SINGULAIR) 10 MG tablet Take 1 tablet by mouth daily.     Historical Provider, MD  Multiple Vitamins-Minerals (PRESERVISION AREDS PO) Take 1 tablet by mouth daily.     Historical Provider, MD  warfarin (COUMADIN) 4 MG tablet Take 4 mg  by mouth daily.    Historical Provider, MD      PHYSICAL EXAMINATION:   VITAL SIGNS: Blood pressure 107/51, pulse 114, temperature 98.2 F (36.8 C), temperature source Oral, resp. rate 20, height 5\' 7"  (1.702 m), weight 66.679 kg (147 lb), SpO2 93 %.  GENERAL:  82 y.o.-year-old patient lying in the bed with no acute distress.  EYES: Pupils equal, round, reactive to light and accommodation. No scleral icterus. Extraocular muscles intact.  HEENT: Head atraumatic, normocephalic. Oropharynx and nasopharynx clear.  NECK:  Supple, no jugular venous  distention. No thyroid enlargement, no tenderness.  LUNGS: Normal breath sounds bilaterally, no wheezing, rales,rhonchi or crepitation. No use of accessory muscles of respiration.  CARDIOVASCULAR: S1, S2 normal. No murmurs, rubs, or gallops.  ABDOMEN: Soft, nontender, nondistended. Bowel sounds present. No organomegaly or mass.  EXTREMITIES: No pedal edema, cyanosis, or clubbing.  NEUROLOGIC: Cranial nerves II through XII are intact. Muscle strength 5/5 in all extremities. Sensation intact. Gait not checked.  PSYCHIATRIC: The patient is alert and oriented x 3.  SKIN: No obvious rash, lesion, or ulcer.   LABORATORY PANEL:   CBC  Recent Labs Lab 02/25/16 1005 02/26/16 1003  WBC 21.2* 43.9*  HGB 9.9* 9.0*  HCT 29.8* 27.5*  PLT 246 254  MCV 95.6 96.4  MCH 31.8 31.4  MCHC 33.2 32.6  RDW 17.6* 18.1*  LYMPHSABS 0.6* 1.3  MONOABS 2.1* 4.8*  EOSABS 0.2 0.0  BASOSABS 0.0 0.0   ------------------------------------------------------------------------------------------------------------------  Chemistries   Recent Labs Lab 02/25/16 1005 02/26/16 1003  NA 136 137  K 3.6 3.6  CL 106 105  CO2 23 24  GLUCOSE 124* 119*  BUN 21* 22*  CREATININE 1.10 1.14  CALCIUM 8.4* 8.3*  AST 19 26  ALT 12* 14*  ALKPHOS 107 88  BILITOT 0.6 0.5   ------------------------------------------------------------------------------------------------------------------ estimated creatinine clearance is 46.7 mL/min (by C-G formula based on Cr of 1.14). ------------------------------------------------------------------------------------------------------------------ No results for input(s): TSH, T4TOTAL, T3FREE, THYROIDAB in the last 72 hours.  Invalid input(s): FREET3   Coagulation profile  Recent Labs Lab 02/26/16 1003  INR 1.66   ------------------------------------------------------------------------------------------------------------------- No results for input(s): DDIMER in the last 72  hours. -------------------------------------------------------------------------------------------------------------------  Cardiac Enzymes  Recent Labs Lab 02/26/16 1003  TROPONINI 0.05*   ------------------------------------------------------------------------------------------------------------------ Invalid input(s): POCBNP  ---------------------------------------------------------------------------------------------------------------  Urinalysis    Component Value Date/Time   COLORURINE YELLOW* 02/26/2016 1150   APPEARANCEUR CLEAR* 02/26/2016 1150   LABSPEC 1.021 02/26/2016 1150   PHURINE 6.0 02/26/2016 1150   GLUCOSEU NEGATIVE 02/26/2016 1150   HGBUR NEGATIVE 02/26/2016 1150   BILIRUBINUR NEGATIVE 02/26/2016 1150   KETONESUR NEGATIVE 02/26/2016 1150   PROTEINUR NEGATIVE 02/26/2016 1150   NITRITE NEGATIVE 02/26/2016 1150   LEUKOCYTESUR NEGATIVE 02/26/2016 1150     RADIOLOGY: No results found.  EKG: Orders placed or performed during the hospital encounter of 02/26/16  . ED EKG  . 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   Provide supportive care with IV fluids and nausea medications.  * Leukocytosis   White blood cell count jumped to 43,000 from 21,000 yesterday.   I will call oncology consult for further management.  * Atrial fibrillation   Continue digoxin and Coumadin, pharmacy to adjust the dose of Coumadin.  * COPD   No exacerbation, continue inhaler.  All the records  are reviewed and case discussed with ED provider. Management plans discussed with the patient, family and they are in agreement.  CODE STATUS: Code Status History    Date Active Date Inactive Code Status Order ID Comments User Context   08/06/2015  2:59 AM 08/11/2015  3:21 PM Full Code [id number]  Lytle Butte, MD ED       TOTAL TIME  TAKING CARE OF THIS PATIENT: 50 minutes.    Vaughan Basta M.D on 02/26/2016   Between 7am to 6pm - Pager - [phone]  After 6pm go to [email or web address] - password [email or web address] [facility]  Office  [phone]  CC: Primary care physician; Allyne Gee, MD   Note: This dictation was prepared with Dragon dictation along with smaller phrase technology. Any transcriptional errors that result from this process are unintentional.

## 2016-02-26 NOTE — Consult Note (Signed)
ANTICOAGULATION CONSULT NOTE - Initial Consult  Pharmacy Consult for warfarin Indication: atrial fibrillation  No Known Allergies  Patient Measurements: Height: 5\' 7"  (170.2 cm) Weight: 147 lb (66.679 kg) IBW/kg (Calculated) : 66.1 Heparin Dosing Weight:   Vital Signs: Temp: 98.2 F (36.8 C) (06/01 0908) Temp Source: Oral (06/01 0908) BP: 102/60 mmHg (06/01 1400) Pulse Rate: 83 (06/01 1400)  Labs:  Recent Labs  02/25/16 1005 02/26/16 1003  HGB 9.9* 9.0*  HCT 29.8* 27.5*  PLT 246 254  LABPROT  --  19.6*  INR  --  1.66  CREATININE 1.10 1.14  TROPONINI  --  0.05*    Estimated Creatinine Clearance: 46.7 mL/min (by C-G formula based on Cr of 1.14).   Medical History: Past Medical History  Diagnosis Date  . COPD (chronic obstructive pulmonary disease) ([place])   . Atrial fibrillation ([place])   . Prostate cancer ([place])   . GERD (gastroesophageal reflux disease)   . CHF (congestive heart failure) ([place])   . Dysrhythmia   . Arthritis   . Hypertension   . Hyperlipemia   . Leukocytosis 01/14/2016    Medications:  Scheduled:    Assessment: Pt is a 82 year old male with a PMH of afib on warfarin at home. Pt states he alternates 1/2 tab (2mg ) and 1 whole tab (4mg ) every other day. Pt states he took a whole tab last night, nothing today. INR on admission is low at 1.66.    Goal of Therapy:  INR 2-3 Monitor platelets by anticoagulation protocol: Yes   Plan:  Will give 4mg  tonight. Recheck INR in the AM.  Pt is here for diarrhea/chills would expect elevated INR, however INR subtherapeutic. Will continue to monitor closely.   Ramond Dial, Pharm.D Clinical Pharmacist   02/26/2016,2:20 PM

## 2016-02-26 NOTE — ED Provider Notes (Signed)
[facility] Emergency Department Provider Note        Time seen: ----------------------------------------- 9:14 AM on 02/26/2016 -----------------------------------------    I have reviewed the triage vital signs and the nursing notes.   HISTORY  Chief Complaint Diarrhea and Chills    HPI Thomas Townsend is a 82 y.o. male who presents to ER after having diarrhea and chills all night last night. Patient reportedly has a history of this and this has been going on for years.Patient has had decreased by mouth intake as well as diarrhea. Family states whenever he receives fluids he feels better. He was admitted into the hospital recently, he's also been seen by oncology for an unknown leukocytosis.   Past Medical History  Diagnosis Date  . COPD (chronic obstructive pulmonary disease) ([place])   . Atrial fibrillation ([place])   . Prostate cancer ([place])   . GERD (gastroesophageal reflux disease)   . CHF (congestive heart failure) ([place])   . Dysrhythmia   . Arthritis   . Hypertension   . Hyperlipemia   . Leukocytosis 01/14/2016    Patient Active Problem List   Diagnosis Date Noted  . Leukocytosis 01/14/2016  . Septic shock ([place]) 08/11/2015  . Acidosis, lactic 08/11/2015  . Acute respiratory failure with hypoxia ([place]) 08/11/2015  . Encephalopathy, metabolic [id number]  . Pneumonia 08/11/2015  . Pulmonary hypertension ([place]) 08/11/2015  . Mild aortic stenosis 08/11/2015  . Generalized weakness 08/11/2015  . Anemia of chronic disease 08/11/2015  . Sepsis ([place]) 08/06/2015  . Atrial fibrillation with rapid ventricular response ([place]) 08/06/2015  . CAP (community acquired pneumonia) 08/06/2015  . Rheumatoid arthritis involving multiple joints ([place]) 01/28/2015  . Arthritis or polyarthritis, rheumatoid ([place]) 01/28/2015  . A-fib ([place]) 03/04/2014  . CAFL (chronic airflow limitation) ([place]) 03/04/2014  . Acid reflux 03/04/2014  . Diastolic dysfunction [id number]   . Atrial fibrillation ([place]) 03/04/2014  . Chronic obstructive pulmonary disease ([place]) 03/04/2014    Past Surgical History  Procedure Laterality Date  . Cholecystectomy    . Colonoscopy with propofol N/A 04/04/2015    Procedure: COLONOSCOPY WITH PROPOFOL;  Surgeon: Manya Silvas, MD;  Location: [facility] ENDOSCOPY;  Service: Endoscopy;  Laterality: N/A;  . Esophagogastroduodenoscopy N/A 04/04/2015    Procedure: ESOPHAGOGASTRODUODENOSCOPY (EGD);  Surgeon: Manya Silvas, MD;  Location: [facility] ENDOSCOPY;  Service: Endoscopy;  Laterality: N/A;    Allergies Review of patient's allergies indicates no known allergies.  Social History Social History  Substance Use Topics  . Smoking status: Former Smoker -- 20 years    Types: Cigarettes  . Smokeless tobacco: None  . Alcohol Use: 0.0 oz/week    0 Standard drinks or equivalent per week     Comment: Wine every once in a while    Review of Systems Constitutional: Negative for fever.Positive for chills Eyes: Negative for visual changes. ENT: Negative for sore throat. Cardiovascular: Negative for chest pain. Respiratory: Negative for shortness of breath. Gastrointestinal: Negative for abdominal pain, Positive for diarrhea Genitourinary: Negative for dysuria. Musculoskeletal: Negative for back pain. Skin: Negative for rash. Neurological: Negative for headaches, Positive for weakness  10-point ROS otherwise negative.  ____________________________________________   PHYSICAL EXAM:  VITAL SIGNS: ED Triage Vitals  Enc Vitals Group     BP 02/26/16 0908 107/51 mmHg     Pulse Rate 02/26/16 0908 114     Resp 02/26/16 0908 20     Temp 02/26/16 0908 98.2 F (36.8 C)     Temp Source 02/26/16 0908 Oral  SpO2 02/26/16 0908 93 %     Weight 02/26/16 0908 147 lb (66.679 kg)     Height 02/26/16 0908 5\' 7"  (1.702 m)     Head Cir --      Peak Flow --      Pain Score --      Pain Loc --      Pain Edu? --      Excl. in [place]? --      Constitutional: Alert and oriented. Well appearing and in no distress. Eyes: Conjunctivae are normal. PERRL. Normal extraocular movements. ENT   Head: Normocephalic and atraumatic.   Nose: No congestion/rhinnorhea.   Mouth/Throat: Mucous membranes are moist.   Neck: No stridor. Cardiovascular: Irregularly irregular rhythm.  Respiratory: Normal respiratory effort without tachypnea nor retractions. Breath sounds are clear and equal bilaterally. No wheezes/rales/rhonchi. Gastrointestinal: Soft and nontender. Normal bowel sounds Musculoskeletal: Nontender with normal range of motion in all extremities. No lower extremity tenderness nor edema. Neurologic:  Normal speech and language. No gross focal neurologic deficits are appreciated.  Skin:  Skin is warm, dry and intact. No rash noted. Psychiatric: Mood and affect are normal. Speech and behavior are normal.  ____________________________________________  EKG: Interpreted by me. Atrial fibrillation with a rate of 109 bpm, normal QRS, normal QT interval. Normal axis.  ____________________________________________  ED COURSE:  Pertinent labs & imaging results that were available during my care of the patient were reviewed by me and considered in my medical decision making (see chart for details). Patient resists the ER with diarrhea and chills. Patient will receive IV fluids, we will check basic labs and reevaluate. ____________________________________________    LABS (pertinent positives/negatives)  Labs Reviewed  CBC WITH DIFFERENTIAL/PLATELET - Abnormal; Notable for the following:    WBC 43.9 (*)    RBC 2.86 (*)    Hemoglobin 9.0 (*)    HCT 27.5 (*)    RDW 18.1 (*)    Neutro Abs 37.8 (*)    Monocytes Absolute 4.8 (*)    All other components within normal limits  COMPREHENSIVE METABOLIC PANEL - Abnormal; Notable for the following:    Glucose, Bld 119 (*)    BUN 22 (*)    Calcium 8.3 (*)    Albumin 3.4 (*)    ALT  14 (*)    GFR calc non Af Amer 58 (*)    All other components within normal limits  TROPONIN I - Abnormal; Notable for the following:    Troponin I 0.05 (*)    All other components within normal limits  PROTIME-INR - Abnormal; Notable for the following:    Prothrombin Time 19.6 (*)    All other components within normal limits  GLUCOSE, CAPILLARY - Abnormal; Notable for the following:    Glucose-Capillary 118 (*)    All other components within normal limits  C DIFFICILE QUICK SCREEN W PCR REFLEX  GASTROINTESTINAL PANEL BY PCR, STOOL (REPLACES STOOL CULTURE)  URINALYSIS COMPLETEWITH MICROSCOPIC ([facility] ONLY)  CBG MONITORING, ED   ____________________________________________  FINAL ASSESSMENT AND PLAN  Weakness, diarrhea, marked leukocytosis, elevated troponin  Plan: Patient with labs and imaging as dictated above. Patient's feeling better after a liter of saline, however after discussed with his hematologist to agrees he likely needs observation. Patient feels like he just got dehydrated, but I'm concerned because his white count has doubled in the last 24 hours. He is afebrile, we will await stool studies and urinalysis. He will need a repeat of his troponin.  Earleen Newport, MD   Note: This dictation was prepared with Dragon dictation. Any transcriptional errors that result from this process are unintentional   Earleen Newport, MD 02/26/16 1230

## 2016-02-27 DIAGNOSIS — R197 Diarrhea, unspecified: Secondary | ICD-10-CM | POA: Diagnosis not present

## 2016-02-27 DIAGNOSIS — I4891 Unspecified atrial fibrillation: Secondary | ICD-10-CM | POA: Diagnosis not present

## 2016-02-27 DIAGNOSIS — J449 Chronic obstructive pulmonary disease, unspecified: Secondary | ICD-10-CM | POA: Diagnosis not present

## 2016-02-27 DIAGNOSIS — D72829 Elevated white blood cell count, unspecified: Secondary | ICD-10-CM | POA: Diagnosis not present

## 2016-02-27 LAB — BASIC METABOLIC PANEL
ANION GAP: 10 (ref 5–15)
BUN: 18 mg/dL (ref 6–20)
CO2: 22 mmol/L (ref 22–32)
Calcium: 7.8 mg/dL — ABNORMAL LOW (ref 8.9–10.3)
Chloride: 108 mmol/L (ref 101–111)
Creatinine, Ser: 0.92 mg/dL (ref 0.61–1.24)
GFR calc Af Amer: >60 mL/min (ref >60)
GFR calc non Af Amer: >60 mL/min (ref >60)
GLUCOSE: 94 mg/dL (ref 65–99)
POTASSIUM: 3.4 mmol/L — AB (ref 3.5–5.1)
Sodium: 140 mmol/L (ref 135–145)

## 2016-02-27 LAB — PROTIME-INR
INR: 1.54
Prothrombin Time: 18.5 seconds — ABNORMAL HIGH (ref 11.4–15.0)

## 2016-02-27 LAB — CBC
HEMATOCRIT: 28.2 % — AB (ref 40.0–52.0)
HEMOGLOBIN: 9.2 g/dL — AB (ref 13.0–18.0)
MCH: 31.6 pg (ref 26.0–34.0)
MCHC: 32.6 g/dL (ref 32.0–36.0)
MCV: 97 fL (ref 80.0–100.0)
Platelets: 249 10*3/uL (ref 150–440)
RBC: 2.91 MIL/uL — ABNORMAL LOW (ref 4.40–5.90)
RDW: 18.1 % — ABNORMAL HIGH (ref 11.5–14.5)
WBC: 28.9 10*3/uL — ABNORMAL HIGH (ref 3.8–10.6)

## 2016-02-27 MED ORDER — WARFARIN SODIUM 4 MG PO TABS: 4.0000 mg | ORAL_TABLET | Freq: Once | ORAL | Status: DC

## 2016-02-27 NOTE — Progress Notes (Signed)
Pt has been discharged home.  Discharge instructions given and explained to pt and pt's son. Pt and pt's son verbalized understanding. No new RX at this time. F/U appointment reviewed with pt. Pt escorted on a wheelchair.

## 2016-02-27 NOTE — Care Management (Addendum)
Admitted to [facility] under observation status with the diagnosis of diarrhea. Son,Michael, [phone]). Last seen Dr. Humphrey Rolls 3 weeks ago. Home Health in the past. "Don't need them." No skilled facility. No home oxygen. Takes care of all basic and instrumental activities of daily living himself, drives. No falls. Good appetite. Does have [employer] Alert in the home. Son will transport. Shelbie Ammons RN MSN CCM Care Management [phone]

## 2016-02-27 NOTE — Consult Note (Signed)
ANTICOAGULATION CONSULT NOTE - Initial Consult  Pharmacy Consult for warfarin Indication: atrial fibrillation  No Known Allergies  Patient Measurements: Height: 5\' 7"  (170.2 cm) Weight: 147 lb (66.679 kg) IBW/kg (Calculated) : 66.1 Heparin Dosing Weight:   Vital Signs: Temp: 98 F (36.7 C) (06/02 0548) Temp Source: Oral (06/02 0548) BP: 109/52 mmHg (06/02 0548) Pulse Rate: 96 (06/02 0548)  Labs:  Recent Labs  02/25/16 1005 02/26/16 1003 02/27/16 0527  HGB 9.9* 9.0* 9.2*  HCT 29.8* 27.5* 28.2*  PLT 246 254 249  LABPROT  --  19.6* 18.5*  INR  --  1.66 1.54  CREATININE 1.10 1.14 0.92  TROPONINI  --  0.05*  --     Estimated Creatinine Clearance: 57.9 mL/min (by C-G formula based on Cr of 0.92).   Medical History: Past Medical History  Diagnosis Date  . COPD (chronic obstructive pulmonary disease) ([place])   . Atrial fibrillation ([place])   . Prostate cancer ([place])   . GERD (gastroesophageal reflux disease)   . CHF (congestive heart failure) ([place])   . Dysrhythmia   . Arthritis   . Hypertension   . Hyperlipemia   . Leukocytosis 01/14/2016    Medications:  Scheduled:  . digoxin  0.125 mg Oral Daily  . ferrous sulfate  325 mg Oral q morning - 10a  . fluticasone furoate-vilanterol  1 puff Inhalation BH-q7a  . folic acid  1 mg Oral Daily  . montelukast  10 mg Oral Daily  . multivitamin-lutein   Oral Daily  . warfarin  4 mg Oral ONCE-1800  . Warfarin - Pharmacist Dosing Inpatient   Does not apply q1800    Assessment: Pt is a 82 year old male with a PMH of afib on warfarin at home. Pt states he alternates 1/2 tab (2mg ) and 1 whole tab (4mg ) every other day. Pt states he took a whole tab last night, nothing today. INR on admission is low at 1.66.   05/31 (PTA) 4mg  taken at home 06/01: INR - 1.66, Warfarin 4mg  06/02: INR - 1.54   Goal of Therapy:  INR 2-3 Monitor platelets by anticoagulation protocol: Yes   Plan:  Will give 4mg  again tonight. Recheck INR in  the AM.  Pt is here for diarrhea/chills would expect elevated INR, however INR subtherapeutic and trending down. Will continue to monitor closely.   Rexene Edison, PharmD Clinical Pharmacist   02/27/2016,8:15 AM

## 2016-02-27 NOTE — Care Management Obs Status (Signed)
[facility] NOTIFICATION   Patient Details  Name: Thomas Townsend MRN: [id number] Date of Birth: [birth year]   Medicare Observation Status Notification Given:  Yes    Shelbie Ammons, RN 02/27/2016, 9:16 AM

## 2016-02-27 NOTE — Discharge Summary (Signed)
[facility] [place] NAME: Thomas Townsend    MR#:  [id number]  DATE OF BIRTH:  26-Oct-1930  DATE OF ADMISSION:  02/26/2016 ADMITTING PHYSICIAN: Vaughan Basta, MD  DATE OF DISCHARGE: 02/27/2016  PRIMARY CARE PHYSICIAN: Devona Konig A, MD    ADMISSION DIAGNOSIS:  Leukocytosis [D72.829] Weakness [R53.1] Elevated troponin I level [R79.89] Diarrhea, unspecified type [R19.7]  DISCHARGE DIAGNOSIS:  Principal Problem:   Diarrhea   SECONDARY DIAGNOSIS:   Past Medical History  Diagnosis Date  . COPD (chronic obstructive pulmonary disease) ([place])   . Atrial fibrillation ([place])   . Prostate cancer ([place])   . GERD (gastroesophageal reflux disease)   . CHF (congestive heart failure) ([place])   . Dysrhythmia   . Arthritis   . Hypertension   . Hyperlipemia   . Leukocytosis 01/14/2016    HOSPITAL COURSE:   82 year old male with a history of COPD, IDA and leukocytosis who presented with diarrhea. For further details please refer the H&P.   1. Diarrhea: Patient had no diarrhea while in the hospital. Stool cultures were ordered however as just stated he had no diarrhea so these were not collected.  2. Leukocytosis: Patient has a history of leukocytosis with base line white blood cell count around 21-23,000. His white blood cell count is 28,000 at discharge. Leukocytosis is likely due to dehydration and reactive in nature. He is being followed by oncology as an outpatient. I discussed case with his oncologist or discharge.  3. Chronic atrial fibrillation: Patient will continue digoxin and Coumadin.  4. COPD: Patient does not appear to be in exacerbation at this time.  DISCHARGE CONDITIONS AND DIET:   Stable for discharge on a regular diet  CONSULTS OBTAINED:     DRUG ALLERGIES:  No Known Allergies  DISCHARGE MEDICATIONS:   Current Discharge Medication List    CONTINUE these medications which have NOT CHANGED   Details  BREO  ELLIPTA 100-25 MCG/INH AEPB Inhale 1 puff into the lungs every morning.    digoxin (LANOXIN) 0.125 MG tablet     ferrous sulfate 325 (65 FE) MG tablet Take 325 mg by mouth every morning. Reported on 0000000    folic acid (FOLVITE) 1 MG tablet Take 1 mg by mouth daily.  Refills: 11    furosemide (LASIX) 20 MG tablet Take 20 mg by mouth daily. In the morning    montelukast (SINGULAIR) 10 MG tablet Take 1 tablet by mouth daily.     Multiple Vitamins-Minerals (PRESERVISION AREDS PO) Take 1 tablet by mouth daily.     warfarin (COUMADIN) 4 MG tablet Take 2-4 mg by mouth daily. 1/2 tab every other day and a whole tab on the other days.    methotrexate (RHEUMATREX) 2.5 MG tablet TAKE 6 TABLETS (15 MG TOTAL) BY MOUTH EVERY 7 (SEVEN) DAYS. Refills: 4              Today   CHIEF COMPLAINT:  Patient doing well this morning. Patient denies diarrhea or abdominal pain. Patient states he still works with his [employer] outdoors and does all of his shopping and is very independent.   VITAL SIGNS:  Blood pressure 103/59, pulse 82, temperature 98 F (36.7 C), temperature source Oral, resp. rate 20, height 5\' 7"  (1.702 m), weight 66.679 kg (147 lb), SpO2 94 %.   REVIEW OF SYSTEMS:  Review of Systems  Constitutional: Negative for fever, chills and malaise/fatigue.  HENT: Negative for ear discharge, ear pain, hearing loss, nosebleeds and  sore throat.   Eyes: Negative for blurred vision and pain.  Respiratory: Negative for cough, hemoptysis, shortness of breath and wheezing.   Cardiovascular: Negative for chest pain, palpitations and leg swelling.  Gastrointestinal: Negative for nausea, vomiting, abdominal pain, diarrhea and blood in stool.  Genitourinary: Negative for dysuria.  Musculoskeletal: Negative for back pain.  Neurological: Negative for dizziness, tremors, speech change, focal weakness, seizures and headaches.  Endo/Heme/Allergies: Does not bruise/bleed easily.   Psychiatric/Behavioral: Negative for depression, suicidal ideas and hallucinations.     PHYSICAL EXAMINATION:  GENERAL:  82 y.o.-year-old patient lying in the bed with no acute distress.  NECK:  Supple, no jugular venous distention. No thyroid enlargement, no tenderness.  LUNGS: Normal breath sounds bilaterally, no wheezing, rales,rhonchi  No use of accessory muscles of respiration.  CARDIOVASCULAR: S1, S2 normal. No murmurs, rubs, or gallops.  ABDOMEN: Soft, non-tender, non-distended. Bowel sounds present. No organomegaly or mass.  EXTREMITIES: No pedal edema, cyanosis, or clubbing.  PSYCHIATRIC: The patient is alert and oriented x 3.  SKIN: No obvious rash, lesion, or ulcer.   DATA REVIEW:   CBC  Recent Labs Lab 02/27/16 0527  WBC 28.9*  HGB 9.2*  HCT 28.2*  PLT 249    Chemistries   Recent Labs Lab 02/26/16 1003 02/27/16 0527  NA 137 140  K 3.6 3.4*  CL 105 108  CO2 24 22  GLUCOSE 119* 94  BUN 22* 18  CREATININE 1.14 0.92  CALCIUM 8.3* 7.8*  AST 26  --   ALT 14*  --   ALKPHOS 88  --   BILITOT 0.5  --     Cardiac Enzymes  Recent Labs Lab 02/26/16 1003  TROPONINI 0.05*    Microbiology Results  @MICRORSLT48 @  RADIOLOGY:  No results found.    Management plans discussed with the patient and he is in agreement. Stable for discharge home  Patient should follow up with PCP in 1 week  CODE STATUS:     Code Status Orders        Start     Ordered   02/26/16 1435  Limited resuscitation (code)   Continuous    Question Answer Comment  In the event of cardiac or respiratory ARREST: Initiate Code Blue, Call Rapid Response Yes   In the event of cardiac or respiratory ARREST: Perform CPR Yes   In the event of cardiac or respiratory ARREST: Perform Intubation/Mechanical Ventilation No   In the event of cardiac or respiratory ARREST: Use NIPPV/BiPAp only if indicated Yes   In the event of cardiac or respiratory ARREST: Administer ACLS medications if  indicated Yes   In the event of cardiac or respiratory ARREST: Perform Defibrillation or Cardioversion if indicated Yes      02/26/16 1434    Code Status History    Date Active Date Inactive Code Status Order ID Comments User Context   08/06/2015  2:59 AM 08/11/2015  3:21 PM Full Code [id number]  Lytle Butte, MD ED    Advance Directive Documentation        Most Recent Value   Type of Advance Directive  Healthcare Power of Attorney   Pre-existing out of facility DNR order (yellow form or pink MOST form)     "MOST" Form in Place?        TOTAL TIME TAKING CARE OF THIS PATIENT: 36 minutes.    Note: This dictation was prepared with Dragon dictation along with smaller phrase technology. Any transcriptional errors that result from this  process are unintentional.  Reace Breshears M.D on 02/27/2016 at 11:06 AM  Between 7am to 6pm - Pager - [phone] After 6pm go to [email or web address] - password [email or web address] [facility]  Office  [phone]  CC: Primary care physician; Allyne Gee, MD

## 2016-03-04 DIAGNOSIS — I4891 Unspecified atrial fibrillation: Secondary | ICD-10-CM | POA: Diagnosis not present

## 2016-03-04 DIAGNOSIS — R197 Diarrhea, unspecified: Secondary | ICD-10-CM | POA: Diagnosis not present

## 2016-03-04 DIAGNOSIS — C9290 Myeloid leukemia, unspecified, not having achieved remission: Secondary | ICD-10-CM | POA: Diagnosis not present

## 2016-03-09 DIAGNOSIS — I482 Chronic atrial fibrillation: Secondary | ICD-10-CM | POA: Diagnosis not present

## 2016-03-16 DIAGNOSIS — I480 Paroxysmal atrial fibrillation: Secondary | ICD-10-CM | POA: Diagnosis not present

## 2016-03-16 DIAGNOSIS — J410 Simple chronic bronchitis: Secondary | ICD-10-CM | POA: Diagnosis not present

## 2016-03-18 DIAGNOSIS — I4891 Unspecified atrial fibrillation: Secondary | ICD-10-CM | POA: Diagnosis not present

## 2016-03-18 DIAGNOSIS — E782 Mixed hyperlipidemia: Secondary | ICD-10-CM | POA: Diagnosis not present

## 2016-03-18 DIAGNOSIS — I509 Heart failure, unspecified: Secondary | ICD-10-CM | POA: Diagnosis not present

## 2016-03-18 DIAGNOSIS — Z0001 Encounter for general adult medical examination with abnormal findings: Secondary | ICD-10-CM | POA: Diagnosis not present

## 2016-03-24 ENCOUNTER — Ambulatory Visit (INDEPENDENT_AMBULATORY_CARE_PROVIDER_SITE_OTHER): Payer: Medicare Other | Admitting: Ophthalmology

## 2016-03-24 DIAGNOSIS — H353134 Nonexudative age-related macular degeneration, bilateral, advanced atrophic with subfoveal involvement: Secondary | ICD-10-CM

## 2016-03-24 DIAGNOSIS — H35341 Macular cyst, hole, or pseudohole, right eye: Secondary | ICD-10-CM

## 2016-03-24 DIAGNOSIS — H43812 Vitreous degeneration, left eye: Secondary | ICD-10-CM | POA: Diagnosis not present

## 2016-04-01 DIAGNOSIS — H01002 Unspecified blepharitis right lower eyelid: Secondary | ICD-10-CM | POA: Diagnosis not present

## 2016-04-01 DIAGNOSIS — H01001 Unspecified blepharitis right upper eyelid: Secondary | ICD-10-CM | POA: Diagnosis not present

## 2016-04-01 DIAGNOSIS — H04123 Dry eye syndrome of bilateral lacrimal glands: Secondary | ICD-10-CM | POA: Diagnosis not present

## 2016-04-01 DIAGNOSIS — H01004 Unspecified blepharitis left upper eyelid: Secondary | ICD-10-CM | POA: Diagnosis not present

## 2016-04-01 DIAGNOSIS — H01005 Unspecified blepharitis left lower eyelid: Secondary | ICD-10-CM | POA: Diagnosis not present

## 2016-04-02 DIAGNOSIS — C9290 Myeloid leukemia, unspecified, not having achieved remission: Secondary | ICD-10-CM | POA: Diagnosis not present

## 2016-04-02 DIAGNOSIS — J449 Chronic obstructive pulmonary disease, unspecified: Secondary | ICD-10-CM | POA: Diagnosis not present

## 2016-04-02 DIAGNOSIS — I482 Chronic atrial fibrillation: Secondary | ICD-10-CM | POA: Diagnosis not present

## 2016-04-02 DIAGNOSIS — R197 Diarrhea, unspecified: Secondary | ICD-10-CM | POA: Diagnosis not present

## 2016-04-22 DIAGNOSIS — H04123 Dry eye syndrome of bilateral lacrimal glands: Secondary | ICD-10-CM | POA: Diagnosis not present

## 2016-04-22 DIAGNOSIS — H01004 Unspecified blepharitis left upper eyelid: Secondary | ICD-10-CM | POA: Diagnosis not present

## 2016-04-22 DIAGNOSIS — H01005 Unspecified blepharitis left lower eyelid: Secondary | ICD-10-CM | POA: Diagnosis not present

## 2016-04-22 DIAGNOSIS — H01001 Unspecified blepharitis right upper eyelid: Secondary | ICD-10-CM | POA: Diagnosis not present

## 2016-04-22 DIAGNOSIS — H01002 Unspecified blepharitis right lower eyelid: Secondary | ICD-10-CM | POA: Diagnosis not present

## 2016-05-04 DIAGNOSIS — C61 Malignant neoplasm of prostate: Secondary | ICD-10-CM | POA: Diagnosis not present

## 2016-05-11 DIAGNOSIS — C61 Malignant neoplasm of prostate: Secondary | ICD-10-CM | POA: Diagnosis not present

## 2016-05-19 DIAGNOSIS — I480 Paroxysmal atrial fibrillation: Secondary | ICD-10-CM | POA: Diagnosis not present

## 2016-05-19 DIAGNOSIS — M0579 Rheumatoid arthritis with rheumatoid factor of multiple sites without organ or systems involvement: Secondary | ICD-10-CM | POA: Diagnosis not present

## 2016-05-20 DIAGNOSIS — J449 Chronic obstructive pulmonary disease, unspecified: Secondary | ICD-10-CM | POA: Diagnosis not present

## 2016-05-20 DIAGNOSIS — I482 Chronic atrial fibrillation: Secondary | ICD-10-CM | POA: Diagnosis not present

## 2016-05-27 ENCOUNTER — Inpatient Hospital Stay: Payer: Medicare Other | Attending: Internal Medicine

## 2016-05-27 DIAGNOSIS — D509 Iron deficiency anemia, unspecified: Secondary | ICD-10-CM

## 2016-05-27 DIAGNOSIS — D72829 Elevated white blood cell count, unspecified: Secondary | ICD-10-CM

## 2016-05-27 LAB — CBC WITH DIFFERENTIAL/PLATELET
BASOS ABS: 0.1 10*3/uL (ref 0–0.1)
BASOS PCT: 1 %
EOS ABS: 0.4 10*3/uL (ref 0–0.7)
Eosinophils Relative: 3 %
HCT: 26.4 % — ABNORMAL LOW (ref 40.0–52.0)
HEMOGLOBIN: 8.9 g/dL — AB (ref 13.0–18.0)
Lymphocytes Relative: 5 %
Lymphs Abs: 0.8 10*3/uL — ABNORMAL LOW (ref 1.0–3.6)
MCH: 32.3 pg (ref 26.0–34.0)
MCHC: 33.8 g/dL (ref 32.0–36.0)
MCV: 95.7 fL (ref 80.0–100.0)
MONOS PCT: 3 %
Monocytes Absolute: 0.4 10*3/uL (ref 0.2–1.0)
NEUTROS ABS: 14.1 10*3/uL — AB (ref 1.4–6.5)
NEUTROS PCT: 88 %
Platelets: 188 10*3/uL (ref 150–440)
RBC: 2.76 MIL/uL — ABNORMAL LOW (ref 4.40–5.90)
RDW: 19.5 % — AB (ref 11.5–14.5)
WBC: 15.8 10*3/uL — ABNORMAL HIGH (ref 3.8–10.6)

## 2016-06-01 ENCOUNTER — Telehealth: Payer: Self-pay | Admitting: Internal Medicine

## 2016-06-01 DIAGNOSIS — D509 Iron deficiency anemia, unspecified: Secondary | ICD-10-CM

## 2016-06-01 DIAGNOSIS — D72829 Elevated white blood cell count, unspecified: Secondary | ICD-10-CM

## 2016-06-01 NOTE — Telephone Encounter (Signed)
Spoke to patient regarding-  Slowly dropping hemoglobin at 8.9.  Discussed regarding bone marrow biopsy patient reluctan. We will repeat a CBC in 1 month;  If still not improving/worsening recommend bmbx-  We will plan a CT-guided biopsy.  Patient agreement with the plan.   heather- Please order CBC/iron studies/ feritin/ b12/folate/LDH in 1 month.

## 2016-06-02 NOTE — Addendum Note (Signed)
Addended by: Sabino Gasser on: 06/02/2016 11:37 AM   Modules accepted: Orders

## 2016-06-07 ENCOUNTER — Telehealth: Payer: Self-pay | Admitting: *Deleted

## 2016-06-07 NOTE — Telephone Encounter (Signed)
Patients daughter called to verify information patient received from Dr. Burlene Arnt last week. She states that the patient is hard of hearing and does not always understand communication via phone. She would like to be his designated contact. I informed her that she needs to bring in Power of [place] documents and have registration office make the change in the chart.

## 2016-06-16 DIAGNOSIS — I480 Paroxysmal atrial fibrillation: Secondary | ICD-10-CM | POA: Diagnosis not present

## 2016-06-20 ENCOUNTER — Emergency Department: Payer: Medicare Other

## 2016-06-20 ENCOUNTER — Inpatient Hospital Stay
Admission: EM | Admit: 2016-06-20 | Discharge: 2016-06-26 | DRG: 871 | Disposition: A | Discharge status: Home / Self Care | Payer: Medicare Other | Attending: Internal Medicine | Admitting: Internal Medicine

## 2016-06-20 DIAGNOSIS — R918 Other nonspecific abnormal finding of lung field: Secondary | ICD-10-CM | POA: Diagnosis not present

## 2016-06-20 DIAGNOSIS — Z79899 Other long term (current) drug therapy: Secondary | ICD-10-CM | POA: Diagnosis not present

## 2016-06-20 DIAGNOSIS — J189 Pneumonia, unspecified organism: Secondary | ICD-10-CM | POA: Diagnosis not present

## 2016-06-20 DIAGNOSIS — I5033 Acute on chronic diastolic (congestive) heart failure: Secondary | ICD-10-CM | POA: Diagnosis not present

## 2016-06-20 DIAGNOSIS — D471 Chronic myeloproliferative disease: Secondary | ICD-10-CM

## 2016-06-20 DIAGNOSIS — R2689 Other abnormalities of gait and mobility: Secondary | ICD-10-CM

## 2016-06-20 DIAGNOSIS — R2681 Unsteadiness on feet: Secondary | ICD-10-CM

## 2016-06-20 DIAGNOSIS — K219 Gastro-esophageal reflux disease without esophagitis: Secondary | ICD-10-CM | POA: Diagnosis not present

## 2016-06-20 DIAGNOSIS — Z8249 Family history of ischemic heart disease and other diseases of the circulatory system: Secondary | ICD-10-CM | POA: Diagnosis not present

## 2016-06-20 DIAGNOSIS — R402441 Other coma, without documented Glasgow coma scale score, or with partial score reported, in the field [EMT or ambulance]: Secondary | ICD-10-CM | POA: Diagnosis not present

## 2016-06-20 DIAGNOSIS — J9601 Acute respiratory failure with hypoxia: Secondary | ICD-10-CM | POA: Diagnosis not present

## 2016-06-20 DIAGNOSIS — A419 Sepsis, unspecified organism: Secondary | ICD-10-CM | POA: Diagnosis not present

## 2016-06-20 DIAGNOSIS — Z8546 Personal history of malignant neoplasm of prostate: Secondary | ICD-10-CM | POA: Diagnosis not present

## 2016-06-20 DIAGNOSIS — J441 Chronic obstructive pulmonary disease with (acute) exacerbation: Secondary | ICD-10-CM | POA: Diagnosis not present

## 2016-06-20 DIAGNOSIS — I4891 Unspecified atrial fibrillation: Secondary | ICD-10-CM | POA: Diagnosis not present

## 2016-06-20 DIAGNOSIS — R000 Tachycardia, unspecified: Secondary | ICD-10-CM | POA: Diagnosis not present

## 2016-06-20 DIAGNOSIS — J449 Chronic obstructive pulmonary disease, unspecified: Secondary | ICD-10-CM | POA: Diagnosis present

## 2016-06-20 DIAGNOSIS — Z87891 Personal history of nicotine dependence: Secondary | ICD-10-CM | POA: Diagnosis not present

## 2016-06-20 DIAGNOSIS — R0902 Hypoxemia: Secondary | ICD-10-CM

## 2016-06-20 DIAGNOSIS — Z7901 Long term (current) use of anticoagulants: Secondary | ICD-10-CM | POA: Diagnosis not present

## 2016-06-20 DIAGNOSIS — M1990 Unspecified osteoarthritis, unspecified site: Secondary | ICD-10-CM | POA: Diagnosis not present

## 2016-06-20 DIAGNOSIS — D72829 Elevated white blood cell count, unspecified: Secondary | ICD-10-CM | POA: Diagnosis not present

## 2016-06-20 DIAGNOSIS — I482 Chronic atrial fibrillation: Secondary | ICD-10-CM | POA: Diagnosis not present

## 2016-06-20 DIAGNOSIS — R05 Cough: Secondary | ICD-10-CM | POA: Diagnosis not present

## 2016-06-20 DIAGNOSIS — G9341 Metabolic encephalopathy: Secondary | ICD-10-CM | POA: Diagnosis not present

## 2016-06-20 DIAGNOSIS — Z23 Encounter for immunization: Secondary | ICD-10-CM | POA: Diagnosis not present

## 2016-06-20 DIAGNOSIS — J440 Chronic obstructive pulmonary disease with acute lower respiratory infection: Secondary | ICD-10-CM | POA: Diagnosis not present

## 2016-06-20 DIAGNOSIS — R6520 Severe sepsis without septic shock: Secondary | ICD-10-CM | POA: Diagnosis not present

## 2016-06-20 DIAGNOSIS — I5032 Chronic diastolic (congestive) heart failure: Secondary | ICD-10-CM | POA: Diagnosis not present

## 2016-06-20 DIAGNOSIS — R0602 Shortness of breath: Secondary | ICD-10-CM

## 2016-06-20 DIAGNOSIS — Z9049 Acquired absence of other specified parts of digestive tract: Secondary | ICD-10-CM | POA: Diagnosis not present

## 2016-06-20 DIAGNOSIS — D638 Anemia in other chronic diseases classified elsewhere: Secondary | ICD-10-CM

## 2016-06-20 DIAGNOSIS — D649 Anemia, unspecified: Secondary | ICD-10-CM | POA: Diagnosis not present

## 2016-06-20 DIAGNOSIS — M129 Arthropathy, unspecified: Secondary | ICD-10-CM | POA: Diagnosis not present

## 2016-06-20 DIAGNOSIS — I110 Hypertensive heart disease with heart failure: Secondary | ICD-10-CM | POA: Diagnosis not present

## 2016-06-20 LAB — URINALYSIS COMPLETE WITH MICROSCOPIC (ARMC ONLY)
BACTERIA UA: NONE SEEN
BILIRUBIN URINE: NEGATIVE
Glucose, UA: NEGATIVE mg/dL
HGB URINE DIPSTICK: NEGATIVE
Ketones, ur: NEGATIVE mg/dL
LEUKOCYTES UA: NEGATIVE
Nitrite: NEGATIVE
PH: 5 (ref 5.0–8.0)
Protein, ur: NEGATIVE mg/dL
SPECIFIC GRAVITY, URINE: 1.016 (ref 1.005–1.030)

## 2016-06-20 LAB — BLOOD GAS, VENOUS
ACID-BASE DEFICIT: 3.7 mmol/L — AB (ref 0.0–2.0)
BICARBONATE: 20.7 mmol/L (ref 20.0–28.0)
O2 SAT: 67.6 %
PATIENT TEMPERATURE: 37
PO2 VEN: 36 mmHg (ref 32.0–45.0)
pCO2, Ven: 35 mmHg — ABNORMAL LOW (ref 44.0–60.0)
pH, Ven: 7.38 (ref 7.250–7.430)

## 2016-06-20 LAB — COMPREHENSIVE METABOLIC PANEL
ALBUMIN: 3.4 g/dL — AB (ref 3.5–5.0)
ALT: 15 U/L — ABNORMAL LOW (ref 17–63)
ANION GAP: 4 — AB (ref 5–15)
AST: 23 U/L (ref 15–41)
Alkaline Phosphatase: 82 U/L (ref 38–126)
BILIRUBIN TOTAL: 0.8 mg/dL (ref 0.3–1.2)
BUN: 19 mg/dL (ref 6–20)
CHLORIDE: 106 mmol/L (ref 101–111)
CO2: 25 mmol/L (ref 22–32)
Calcium: 8.5 mg/dL — ABNORMAL LOW (ref 8.9–10.3)
Creatinine, Ser: 1.12 mg/dL (ref 0.61–1.24)
GFR calc Af Amer: >60 mL/min (ref >60)
GFR calc non Af Amer: 59 mL/min — ABNORMAL LOW (ref >60)
GLUCOSE: 113 mg/dL — AB (ref 65–99)
POTASSIUM: 3.9 mmol/L (ref 3.5–5.1)
SODIUM: 135 mmol/L (ref 135–145)
Total Protein: 6.7 g/dL (ref 6.5–8.1)

## 2016-06-20 LAB — URINE DRUG SCREEN, QUALITATIVE (ARMC ONLY)
Amphetamines, Ur Screen: NOT DETECTED
BARBITURATES, UR SCREEN: NOT DETECTED
Benzodiazepine, Ur Scrn: NOT DETECTED
CANNABINOID 50 NG, UR ~~LOC~~: NOT DETECTED
COCAINE METABOLITE, UR ~~LOC~~: NOT DETECTED
MDMA (Ecstasy)Ur Screen: NOT DETECTED
Methadone Scn, Ur: NOT DETECTED
OPIATE, UR SCREEN: NOT DETECTED
PHENCYCLIDINE (PCP) UR S: NOT DETECTED
TRICYCLIC, UR SCREEN: NOT DETECTED

## 2016-06-20 LAB — CBC WITH DIFFERENTIAL/PLATELET
BAND NEUTROPHILS: 2 %
BASOS ABS: 0 10*3/uL (ref 0–0.1)
BASOS PCT: 0 %
BLASTS: 0 %
EOS ABS: 0.9 10*3/uL — AB (ref 0–0.7)
EOS PCT: 3 %
HCT: 26.6 % — ABNORMAL LOW (ref 40.0–52.0)
Hemoglobin: 8.5 g/dL — ABNORMAL LOW (ref 13.0–18.0)
LYMPHS ABS: 0.9 10*3/uL — AB (ref 1.0–3.6)
LYMPHS PCT: 3 %
MCH: 32 pg (ref 26.0–34.0)
MCHC: 32.1 g/dL (ref 32.0–36.0)
MCV: 99.8 fL (ref 80.0–100.0)
METAMYELOCYTES PCT: 1 %
MONO ABS: 4.5 10*3/uL — AB (ref 0.2–1.0)
MONOS PCT: 15 %
Myelocytes: 2 %
NEUTROS ABS: 23.7 10*3/uL — AB (ref 1.4–6.5)
Neutrophils Relative %: 74 %
OTHER: 0 %
Platelets: 269 10*3/uL (ref 150–440)
Promyelocytes Absolute: 0 %
RBC: 2.67 MIL/uL — ABNORMAL LOW (ref 4.40–5.90)
RDW: 21.5 % — AB (ref 11.5–14.5)
WBC: 30 10*3/uL — ABNORMAL HIGH (ref 3.8–10.6)
nRBC: 0 /100 WBC

## 2016-06-20 LAB — TROPONIN I: Troponin I: <0.03 ng/mL (ref <0.03)

## 2016-06-20 LAB — PROTIME-INR
INR: 1.51
Prothrombin Time: 18.4 seconds — ABNORMAL HIGH (ref 11.4–15.2)

## 2016-06-20 LAB — DIGOXIN LEVEL: Digoxin Level: 1.2 ng/mL (ref 0.8–2.0)

## 2016-06-20 LAB — LACTIC ACID, PLASMA: LACTIC ACID, VENOUS: 1.7 mmol/L (ref 0.5–1.9)

## 2016-06-20 LAB — ETHANOL: Alcohol, Ethyl (B): <5 mg/dL (ref <5)

## 2016-06-20 MED ORDER — IPRATROPIUM-ALBUTEROL 0.5-2.5 (3) MG/3ML IN SOLN
3.0000 mL | Freq: Once | RESPIRATORY_TRACT | Status: AC
  Administered 2016-06-20: 3 mL via RESPIRATORY_TRACT
  Filled 2016-06-20: qty 3

## 2016-06-20 MED ORDER — SODIUM CHLORIDE 0.9 % IV BOLUS (SEPSIS)
1000.0000 mL | Freq: Once | INTRAVENOUS | Status: AC
  Administered 2016-06-20: 1000 mL via INTRAVENOUS

## 2016-06-20 MED ORDER — AZITHROMYCIN 500 MG PO TABS
500.0000 mg | ORAL_TABLET | Freq: Once | ORAL | Status: AC
  Administered 2016-06-20: 500 mg via ORAL
  Filled 2016-06-20: qty 1

## 2016-06-20 MED ORDER — DEXTROSE 5 % IV SOLN
1.0000 g | Freq: Once | INTRAVENOUS | Status: AC
  Administered 2016-06-20: 1 g via INTRAVENOUS
  Filled 2016-06-20: qty 10

## 2016-06-20 NOTE — ED Provider Notes (Signed)
[facility] Emergency Department Provider Note  ____________________________________________  Time seen: Approximately 8:02 PM  I have reviewed the triage vital signs and the nursing notes.   HISTORY  Chief Complaint Altered Mental Status   HPI Thomas Townsend is a 82 y.o. male the history of atrial fibrillation on Coumadin, CHF, COPD, hypertension, hyperlipidemia, prostate cancer who is brought in by EMS for altered mental status. Patient was shopping at [employer] and became very confused. He sat on the ground and EMS was called. Patient told them that he was shopping with his son however his son was not there. Patient was found to be hypoxic to the upper 80s on room air and was started on 2 L nasal cannula. BP in the low 100s and patient was warm to the touch. EMS was able to contact patient's son who said that usually he can get dehydrated and become very confused. According to the son patient was doing well until yesterday. Patient has no complaints at this time. 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 He is not sure why he is here but knows he is in the hospital. He denies headache, neck pain, back pain, chest pain, fever, nausea, vomiting, abdominal pain, dysuria. According to EMS patient did not fall at the supermarket and staff saw when he sat down on the floor.   Past Medical History:  Diagnosis Date  . Arthritis   . Atrial fibrillation ([place])   . CHF (congestive heart failure) ([place])   . COPD (chronic obstructive pulmonary disease) ([place])   . Dysrhythmia   . GERD (gastroesophageal reflux disease)   . Hyperlipemia   . Hypertension   . Leukocytosis 01/14/2016  . Prostate cancer [facility])     Patient Active Problem List   Diagnosis Date Noted  . Diarrhea 02/26/2016  . Leukocytosis 01/14/2016  . Septic shock ([place]) 08/11/2015  . Acidosis, lactic 08/11/2015  . Acute respiratory failure with hypoxia ([place]) 08/11/2015  . Encephalopathy, metabolic  [id number]  . Pneumonia 08/11/2015  . Pulmonary hypertension ([place]) 08/11/2015  . Mild aortic stenosis 08/11/2015  . Generalized weakness 08/11/2015  . Anemia of chronic disease 08/11/2015  . Sepsis ([place]) 08/06/2015  . Atrial fibrillation with rapid ventricular response ([place]) 08/06/2015  . CAP (community acquired pneumonia) 08/06/2015  . Rheumatoid arthritis involving multiple joints ([place]) 01/28/2015  . Arthritis or polyarthritis, rheumatoid ([place]) 01/28/2015  . A-fib ([place]) 03/04/2014  . CAFL (chronic airflow limitation) ([place]) 03/04/2014  . Acid reflux 03/04/2014  . Diastolic dysfunction [id number]  . Atrial fibrillation ([place]) 03/04/2014  . Chronic obstructive pulmonary disease ([place]) 03/04/2014    Past Surgical History:  Procedure Laterality Date  . CHOLECYSTECTOMY    . COLONOSCOPY WITH PROPOFOL N/A 04/04/2015   Procedure: COLONOSCOPY WITH PROPOFOL;  Surgeon: Manya Silvas, MD;  [facility] ENDOSCOPY;  Service: Endoscopy;  Laterality: N/A;  . ESOPHAGOGASTRODUODENOSCOPY N/A 04/04/2015   Procedure: ESOPHAGOGASTRODUODENOSCOPY (EGD);  Surgeon: Manya Silvas, MD;  Location: [facility] ENDOSCOPY;  Service: Endoscopy;  Laterality: N/A;    Prior to Admission medications   Medication Sig Start Date End Date Taking? Authorizing Provider  BREO ELLIPTA 100-25 MCG/INH AEPB Inhale 1 puff into the lungs every morning. 01/28/15   Historical Provider, MD  digoxin (LANOXIN) 0.125 MG tablet  02/22/14   Historical Provider, MD  ferrous sulfate 325 (65 FE) MG tablet Take 325 mg by mouth every morning. Reported on 02/25/2016    Historical Provider, MD  folic acid (  FOLVITE) 1 MG tablet Take 1 mg by mouth daily.     Historical Provider, MD  furosemide (LASIX) 20 MG tablet Take 20 mg by mouth daily. In the morning    Historical Provider, MD  methotrexate (RHEUMATREX) 2.5 MG tablet TAKE 6 TABLETS (15 MG TOTAL) BY MOUTH EVERY 7 (SEVEN) DAYS. 12/24/15   Historical Provider, MD  montelukast (SINGULAIR) 10 MG tablet  Take 1 tablet by mouth daily.     Historical Provider, MD  Multiple Vitamins-Minerals (PRESERVISION AREDS PO) Take 1 tablet by mouth daily.     Historical Provider, MD  warfarin (COUMADIN) 4 MG tablet Take 2-4 mg by mouth daily. 1/2 tab every other day and a whole tab on the other days.    Historical Provider, MD    Allergies Review of patient's allergies indicates no known allergies.  Family History  Problem Relation Age of Onset  . Hypertension Other     Social History Social History  Substance Use Topics  . Smoking status: Former Smoker    Years: 20.00    Types: Cigarettes  . Smokeless tobacco: Never Used  . Alcohol use 0.0 oz/week     Comment: Wine every once in a while    Review of Systems Constitutional: Negative for fever. Eyes: Negative for visual changes. ENT: Negative for sore throat. Cardiovascular: Negative for chest pain. Respiratory: Negative for shortness of breath. Gastrointestinal: Negative for abdominal pain, vomiting or diarrhea. Genitourinary: Negative for dysuria. Musculoskeletal: Negative for back pain. Skin: Negative for rash. Neurological: Negative for headaches, weakness or numbness. + confusion  ____________________________________________   PHYSICAL EXAM:  VITAL SIGNS: ED Triage Vitals  Enc Vitals Group     BP 06/20/16 1939 115/65     Pulse Rate 06/20/16 1939 70     Resp 06/20/16 1939 (!) 21     Temp 06/20/16 1939 99.7 F (37.6 C)     Temp Source 06/20/16 1939 Oral     SpO2 06/20/16 1934 (!) 89 %     Weight 06/20/16 1940 140 lb (63.5 kg)     Height 06/20/16 1940 5\' 7"  (1.702 m)     Head Circumference --      Peak Flow --      Pain Score --      Pain Loc --      Pain Edu? --      Excl. in [place]? --     Constitutional: Alert and oriented x 2, confused, no distress.  HEENT:      Head: Normocephalic and atraumatic.         Eyes: Conjunctivae are normal. Sclera is non-icteric. EOMI. PERRL      Mouth/Throat: Mucous membranes are  moist.       Neck: Supple with no signs of meningismus. Cardiovascular:  irregularly irregular rhythm with normal rate, AS murmur,  No gallops, or rubs. 2+ symmetrical distal pulses are present in all extremities. No JVD. Respiratory: Tachypneic, decreased breath sounds on there R base, no crackles or wheezing Gastrointestinal: Soft, non tender, and non distended with positive bowel sounds. No rebound or guarding. Musculoskeletal: Nontender with normal range of motion in all extremities. No edema, cyanosis, or erythema of extremities. Neurologic: Normal speech and language. Face is symmetric. Moving all extremities. No gross focal neurologic deficits are appreciated. Skin: Skin is warm, dry and intact. No rash noted.   ____________________________________________   LABS (all labs ordered are listed, but only abnormal results are displayed)  Labs Reviewed  COMPREHENSIVE METABOLIC PANEL -  Abnormal; Notable for the following:       Result Value   Glucose, Bld 113 (*)    Calcium 8.5 (*)    Albumin 3.4 (*)    ALT 15 (*)    GFR calc non Af Amer 59 (*)    Anion gap 4 (*)    All other components within normal limits  CBC WITH DIFFERENTIAL/PLATELET - Abnormal; Notable for the following:    WBC 30.0 (*)    RBC 2.67 (*)    Hemoglobin 8.5 (*)    HCT 26.6 (*)    RDW 21.5 (*)    All other components within normal limits  PROTIME-INR - Abnormal; Notable for the following:    Prothrombin Time 18.4 (*)    All other components within normal limits  CULTURE, BLOOD (ROUTINE X 2)  CULTURE, BLOOD (ROUTINE X 2)  URINE CULTURE  LACTIC ACID, PLASMA  TROPONIN I  ETHANOL  DIGOXIN LEVEL  LACTIC ACID, PLASMA  URINALYSIS COMPLETEWITH MICROSCOPIC ([facility] ONLY)  URINE DRUG SCREEN, QUALITATIVE ([facility] ONLY)  BLOOD GAS, VENOUS   ____________________________________________  EKG  ED ECG REPORT I, Rudene Re, the attending physician, personally viewed and interpreted this ECG.  Atrial  fibrillation, rate of 64, normal QTC, normal interval, no ST elevations or depressions, T-wave inversion on lateral leads. Unchanged from prior  ____________________________________________  RADIOLOGY  CXR: Mild bibasilar airspace opacities are new from the prior study, and may reflect pneumonia or mild interstitial edema. Borderline cardiomegaly.  Head CT: Moderate atrophy with mild periventricular small vessel disease. No intracranial mass, hemorrhage, or extra-axial fluid collection. No acute infarct evident. There are foci of arterial vascular calcification. There is mucosal thickening in several ethmoid air cells. There is mild inferior left mastoid disease. There is leftward deviation of the anterior nasal septum. ____________________________________________   PROCEDURES  Procedure(s) performed: None Procedures Critical Care performed:  Yes  CRITICAL CARE Performed by: Rudene Re  ?  Total critical care time: 35 min  Critical care time was exclusive of separately billable procedures and treating other patients.  Critical care was necessary to treat or prevent imminent or life-threatening deterioration.  Critical care was time spent personally by me on the following activities: development of treatment plan with patient and/or surrogate as well as nursing, discussions with consultants, evaluation of patient's response to treatment, examination of patient, obtaining history from patient or surrogate, ordering and performing treatments and interventions, ordering and review of laboratory studies, ordering and review of radiographic studies, pulse oximetry and re-evaluation of patient's condition.  ____________________________________________   INITIAL IMPRESSION / ASSESSMENT AND PLAN / ED COURSE  82 y.o. male the history of atrial fibrillation on Coumadin, CHF, COPD, hypertension, hyperlipidemia, prostate cancer who is brought in by EMS for altered mental status.  Patient found to be hypoxic on room air per EMS setting the upper 80s. Here patient has a temp of 99.7 and is satting well on 3 L nasal cannula, has decreased breath sounds in the right base and mildly tachypnea. Patient is confused at this time but otherwise neurologically intact with no evidence of acute stroke. His first EKG is unchanged from prior. Differential diagnoses including infection vs intracranial pathology vs hypercapnia. Plan for telemetry, oxygen via [place], labs, CXR, UA, head CT, lactate, blood cultures.   Clinical Course  Comment By Time  Chest x-ray concerning for pneumonia. Patient's white count is 30,000, lactate is 1.7. Patient lives at home and has not been hospitalized recently therefore patient was started on  ceftriaxone and by mouth azithromycin for community-acquired pneumonia. Patient is receiving IV fluids per sepsis protocol. Patient be admitted to the hospitalist service. Rudene Re, MD 09/24 2135    Pertinent labs & imaging results that were available during my care of the patient were reviewed by me and considered in my medical decision making (see chart for details).    ____________________________________________   FINAL CLINICAL IMPRESSION(S) / ED DIAGNOSES  Final diagnoses:  Sepsis, due to unspecified organism [facility])  Community acquired pneumonia  Acute respiratory failure with hypoxia ([place])      NEW MEDICATIONS STARTED DURING THIS VISIT:  New Prescriptions   No medications on file     Note:  This document was prepared using Dragon voice recognition software and may include unintentional dictation errors.    Rudene Re, MD 06/20/16 2137

## 2016-06-20 NOTE — ED Triage Notes (Signed)
Pt to ED via [facility] c/o altered mental status. Per EMS pt was found confused outside of a [employer], pt A&O to self and time, disoriented to situation. EMS reports pt tachypenic, hypotensive, and sating at 88% on RA.

## 2016-06-20 NOTE — ED Notes (Signed)
Fall bracelet placed on patient. Family at bedside and patient denies need for anything at this time.

## 2016-06-21 DIAGNOSIS — R6520 Severe sepsis without septic shock: Secondary | ICD-10-CM | POA: Diagnosis present

## 2016-06-21 DIAGNOSIS — D72829 Elevated white blood cell count, unspecified: Secondary | ICD-10-CM | POA: Diagnosis not present

## 2016-06-21 DIAGNOSIS — J9601 Acute respiratory failure with hypoxia: Secondary | ICD-10-CM | POA: Diagnosis present

## 2016-06-21 DIAGNOSIS — R000 Tachycardia, unspecified: Secondary | ICD-10-CM | POA: Diagnosis present

## 2016-06-21 DIAGNOSIS — Z7901 Long term (current) use of anticoagulants: Secondary | ICD-10-CM | POA: Diagnosis not present

## 2016-06-21 DIAGNOSIS — D649 Anemia, unspecified: Secondary | ICD-10-CM | POA: Diagnosis not present

## 2016-06-21 DIAGNOSIS — I110 Hypertensive heart disease with heart failure: Secondary | ICD-10-CM | POA: Diagnosis present

## 2016-06-21 DIAGNOSIS — G9341 Metabolic encephalopathy: Secondary | ICD-10-CM | POA: Diagnosis present

## 2016-06-21 DIAGNOSIS — I5032 Chronic diastolic (congestive) heart failure: Secondary | ICD-10-CM | POA: Diagnosis present

## 2016-06-21 DIAGNOSIS — J189 Pneumonia, unspecified organism: Secondary | ICD-10-CM | POA: Diagnosis present

## 2016-06-21 DIAGNOSIS — A419 Sepsis, unspecified organism: Secondary | ICD-10-CM | POA: Diagnosis present

## 2016-06-21 DIAGNOSIS — M129 Arthropathy, unspecified: Secondary | ICD-10-CM | POA: Diagnosis not present

## 2016-06-21 DIAGNOSIS — Z79899 Other long term (current) drug therapy: Secondary | ICD-10-CM | POA: Diagnosis not present

## 2016-06-21 DIAGNOSIS — Z9049 Acquired absence of other specified parts of digestive tract: Secondary | ICD-10-CM | POA: Diagnosis not present

## 2016-06-21 DIAGNOSIS — Z87891 Personal history of nicotine dependence: Secondary | ICD-10-CM | POA: Diagnosis not present

## 2016-06-21 DIAGNOSIS — D638 Anemia in other chronic diseases classified elsewhere: Secondary | ICD-10-CM | POA: Diagnosis present

## 2016-06-21 DIAGNOSIS — Z8546 Personal history of malignant neoplasm of prostate: Secondary | ICD-10-CM | POA: Diagnosis not present

## 2016-06-21 DIAGNOSIS — J440 Chronic obstructive pulmonary disease with acute lower respiratory infection: Secondary | ICD-10-CM | POA: Diagnosis present

## 2016-06-21 DIAGNOSIS — I482 Chronic atrial fibrillation: Secondary | ICD-10-CM | POA: Diagnosis present

## 2016-06-21 DIAGNOSIS — J441 Chronic obstructive pulmonary disease with (acute) exacerbation: Secondary | ICD-10-CM | POA: Diagnosis present

## 2016-06-21 DIAGNOSIS — K219 Gastro-esophageal reflux disease without esophagitis: Secondary | ICD-10-CM | POA: Diagnosis present

## 2016-06-21 DIAGNOSIS — Z8249 Family history of ischemic heart disease and other diseases of the circulatory system: Secondary | ICD-10-CM | POA: Diagnosis not present

## 2016-06-21 DIAGNOSIS — M1990 Unspecified osteoarthritis, unspecified site: Secondary | ICD-10-CM | POA: Diagnosis present

## 2016-06-21 LAB — CBC
HCT: 21.6 % — ABNORMAL LOW (ref 40.0–52.0)
Hemoglobin: 7.3 g/dL — ABNORMAL LOW (ref 13.0–18.0)
MCH: 33.5 pg (ref 26.0–34.0)
MCHC: 33.8 g/dL (ref 32.0–36.0)
MCV: 99.2 fL (ref 80.0–100.0)
PLATELETS: 219 10*3/uL (ref 150–440)
RBC: 2.18 MIL/uL — AB (ref 4.40–5.90)
RDW: 21.5 % — AB (ref 11.5–14.5)
WBC: 29.4 10*3/uL — AB (ref 3.8–10.6)

## 2016-06-21 LAB — BASIC METABOLIC PANEL
ANION GAP: 3 — AB (ref 5–15)
BUN: 15 mg/dL (ref 6–20)
CALCIUM: 7.5 mg/dL — AB (ref 8.9–10.3)
CHLORIDE: 112 mmol/L — AB (ref 101–111)
CO2: 23 mmol/L (ref 22–32)
CREATININE: 0.92 mg/dL (ref 0.61–1.24)
GFR calc non Af Amer: >60 mL/min (ref >60)
Glucose, Bld: 100 mg/dL — ABNORMAL HIGH (ref 65–99)
POTASSIUM: 3.7 mmol/L (ref 3.5–5.1)
Sodium: 138 mmol/L (ref 135–145)

## 2016-06-21 MED ORDER — ONDANSETRON HCL 4 MG/2ML IJ SOLN: 4.0000 mg | Freq: Four times a day (QID) | INTRAMUSCULAR | Status: DC | PRN

## 2016-06-21 MED ORDER — AZITHROMYCIN 500 MG PO TABS
500.0000 mg | ORAL_TABLET | Freq: Every day | ORAL | Status: DC
Start: 2016-06-21 — End: 2016-06-26
  Administered 2016-06-21 – 2016-06-25 (×5): 500 mg via ORAL
  Filled 2016-06-21 (×5): qty 1

## 2016-06-21 MED ORDER — ACETAMINOPHEN 650 MG RE SUPP: 650.0000 mg | Freq: Four times a day (QID) | RECTAL | Status: DC | PRN

## 2016-06-21 MED ORDER — DEXTROSE 5 % IV SOLN
1.0000 g | INTRAVENOUS | Status: DC
  Administered 2016-06-21 – 2016-06-25 (×5): 1 g via INTRAVENOUS
  Filled 2016-06-21 (×5): qty 10

## 2016-06-21 MED ORDER — AZITHROMYCIN 500 MG PO TABS: 500.0000 mg | ORAL_TABLET | Freq: Once | ORAL | Status: DC

## 2016-06-21 MED ORDER — IPRATROPIUM-ALBUTEROL 0.5-2.5 (3) MG/3ML IN SOLN
3.0000 mL | RESPIRATORY_TRACT | Status: DC | PRN
  Administered 2016-06-21 (×2): 3 mL via RESPIRATORY_TRACT
  Filled 2016-06-21 (×2): qty 3

## 2016-06-21 MED ORDER — INFLUENZA VAC SPLIT QUAD 0.5 ML IM SUSY
0.5000 mL | PREFILLED_SYRINGE | INTRAMUSCULAR | Status: AC
  Administered 2016-06-25: 0.5 mL via INTRAMUSCULAR
  Filled 2016-06-21: qty 0.5

## 2016-06-21 MED ORDER — ACETAMINOPHEN 325 MG PO TABS
650.0000 mg | ORAL_TABLET | Freq: Four times a day (QID) | ORAL | Status: DC | PRN
Start: 2016-06-21 — End: 2016-06-26

## 2016-06-21 MED ORDER — FUROSEMIDE 20 MG PO TABS
20.0000 mg | ORAL_TABLET | Freq: Every day | ORAL | Status: DC
  Administered 2016-06-21 – 2016-06-24 (×4): 20 mg via ORAL
  Filled 2016-06-21 (×4): qty 1

## 2016-06-21 MED ORDER — ONDANSETRON HCL 4 MG PO TABS: 4.0000 mg | ORAL_TABLET | Freq: Four times a day (QID) | ORAL | Status: DC | PRN

## 2016-06-21 MED ORDER — DOCUSATE SODIUM 100 MG PO CAPS
100.0000 mg | ORAL_CAPSULE | Freq: Two times a day (BID) | ORAL | Status: DC
  Administered 2016-06-22 – 2016-06-26 (×6): 100 mg via ORAL
  Filled 2016-06-21 (×8): qty 1

## 2016-06-21 MED ORDER — MONTELUKAST SODIUM 10 MG PO TABS
10.0000 mg | ORAL_TABLET | Freq: Every day | ORAL | Status: DC
  Administered 2016-06-21 – 2016-06-26 (×6): 10 mg via ORAL
  Filled 2016-06-21 (×6): qty 1

## 2016-06-21 MED ORDER — SODIUM CHLORIDE 0.9 % IV SOLN
INTRAVENOUS | Status: AC
  Administered 2016-06-21: 03:00:00 via INTRAVENOUS

## 2016-06-21 MED ORDER — FLUTICASONE FUROATE-VILANTEROL 100-25 MCG/INH IN AEPB
1.0000 | INHALATION_SPRAY | RESPIRATORY_TRACT | Status: DC
  Administered 2016-06-21 – 2016-06-22 (×2): 1 via RESPIRATORY_TRACT
  Filled 2016-06-21: qty 28

## 2016-06-21 MED ORDER — WARFARIN SODIUM 2 MG PO TABS
3.0000 mg | ORAL_TABLET | Freq: Every day | ORAL | Status: DC
  Administered 2016-06-21: 18:00:00 3 mg via ORAL
  Filled 2016-06-21: qty 2

## 2016-06-21 MED ORDER — BISACODYL 5 MG PO TBEC
5.0000 mg | DELAYED_RELEASE_TABLET | Freq: Every day | ORAL | Status: DC | PRN
  Administered 2016-06-26: 09:00:00 5 mg via ORAL
  Filled 2016-06-21: qty 1

## 2016-06-21 MED ORDER — SODIUM CHLORIDE 0.9% FLUSH
3.0000 mL | Freq: Two times a day (BID) | INTRAVENOUS | Status: DC
  Administered 2016-06-21 – 2016-06-26 (×11): 3 mL via INTRAVENOUS

## 2016-06-21 MED ORDER — DIGOXIN 125 MCG PO TABS
0.1250 mg | ORAL_TABLET | Freq: Every day | ORAL | Status: DC
  Administered 2016-06-21 – 2016-06-26 (×6): 0.125 mg via ORAL
  Filled 2016-06-21 (×6): qty 1

## 2016-06-21 NOTE — Progress Notes (Signed)
[place] at [place] NAME: Garrison Terral    MR#:  [id number]  DATE OF BIRTH:  Jul 11, 1931  SUBJECTIVE:  CHIEF COMPLAINT:   Chief Complaint  Patient presents with  . Altered Mental Status   The patient feels better. Has mild cough and shortness of breath, hypoxia at 87% on O2 [place] 2L, now on O2 [place] 3 L. REVIEW OF SYSTEMS:  Review of Systems  Constitutional: Positive for malaise/fatigue. Negative for chills and fever.  HENT: Negative for sore throat.   Eyes: Negative for blurred vision and double vision.  Respiratory: Positive for cough and shortness of breath. Negative for wheezing and stridor.   Cardiovascular: Negative for chest pain, palpitations and leg swelling.  Gastrointestinal: Negative for blood in stool, diarrhea, melena, nausea and vomiting.  Genitourinary: Negative for dysuria and urgency.  Musculoskeletal: Negative for joint pain.  Skin: Negative for itching and rash.  Neurological: Negative for dizziness, seizures, loss of consciousness and headaches.  Psychiatric/Behavioral: Negative for depression.    DRUG ALLERGIES:  No Known Allergies VITALS:  Blood pressure (!) 97/43, pulse 87, temperature 97 F (36.1 C), resp. rate 20, height 5\' 7"  (1.702 m), weight 145 lb (65.8 kg), SpO2 93 %. PHYSICAL EXAMINATION:  Physical Exam  Constitutional: He is oriented to person, place, and time and well-developed, well-nourished, and in no distress. No distress.  HENT:  Head: Normocephalic.  Mouth/Throat: Oropharynx is clear and moist.  Eyes: Conjunctivae and EOM are normal. No scleral icterus.  Neck: Normal range of motion. Neck supple. No JVD present. No thyromegaly present.  Cardiovascular: Normal rate, regular rhythm and normal heart sounds.  Exam reveals no gallop.   No murmur heard. Pulmonary/Chest: Effort normal and breath sounds normal. No respiratory distress. He has no wheezes. He has no rales.  Crackles  Abdominal: He  exhibits no distension. There is no tenderness.  Musculoskeletal: Normal range of motion. He exhibits no edema or tenderness.  Lymphadenopathy:    He has no cervical adenopathy.  Neurological: He is alert and oriented to person, place, and time. No cranial nerve deficit.  Skin: Skin is warm.  Psychiatric: Affect normal.   LABORATORY PANEL:   CBC  Recent Labs Lab 06/21/16 0426  WBC 29.4*  HGB 7.3*  HCT 21.6*  PLT 219   ------------------------------------------------------------------------------------------------------------------ Chemistries   Recent Labs Lab 06/20/16 1949 06/21/16 0426  NA 135 138  K 3.9 3.7  CL 106 112*  CO2 25 23  GLUCOSE 113* 100*  BUN 19 15  CREATININE 1.12 0.92  CALCIUM 8.5* 7.5*  AST 23  --   ALT 15*  --   ALKPHOS 82  --   BILITOT 0.8  --    RADIOLOGY:  Ct Head Wo Contrast  Result Date: 06/20/2016 CLINICAL DATA:  Altered mental status/confusion EXAM: CT HEAD WITHOUT CONTRAST TECHNIQUE: Contiguous axial images were obtained from the base of the skull through the vertex without intravenous contrast. COMPARISON:  None. FINDINGS: Brain: There is moderate diffuse atrophy. There is no intracranial mass, hemorrhage, extra-axial fluid collection, or midline shift. There is mild patchy small vessel disease in the centra semiovale bilaterally. Elsewhere gray-white compartments appear normal. No acute infarct evident. Vascular: No hyperdense vessel. There is calcification in both carotid siphon regions. There is also calcification in the distal left vertebral artery. Skull: The bony calvarium appears intact. Sinuses/Orbits: There is mucosal thickening in several ethmoid air cells bilaterally. Visualized paranasal sinuses elsewhere clear. There is leftward deviation of  the anterior nasal septum. Visualized orbits appear symmetric bilaterally. Other: There is mild thickening in several mastoid air cells inferiorly on the left. Mastoid air cells elsewhere clear.  IMPRESSION: Moderate atrophy with mild periventricular small vessel disease. No intracranial mass, hemorrhage, or extra-axial fluid collection. No acute infarct evident. There are foci of arterial vascular calcification. There is mucosal thickening in several ethmoid air cells. There is mild inferior left mastoid disease. There is leftward deviation of the anterior nasal septum. Electronically Signed   By: Lowella Grip III M.D.   On: 06/20/2016 21:10   Dg Chest Port 1 View  Result Date: 06/20/2016 CLINICAL DATA:  Acute onset of confusion.  Initial encounter. EXAM: PORTABLE CHEST 1 VIEW COMPARISON:  Chest radiograph performed 08/25/2015 FINDINGS: The lungs are well-aerated. Mild bibasilar airspace opacities are new from the prior study, and may reflect pneumonia or mild interstitial edema. There is no evidence of pleural effusion or pneumothorax. The cardiomediastinal silhouette is borderline enlarged. No acute osseous abnormalities are seen. IMPRESSION: Mild bibasilar airspace opacities are new from the prior study, and may reflect pneumonia or mild interstitial edema. Borderline cardiomegaly. Electronically Signed   By: Garald Balding M.D.   On: 06/20/2016 20:05   ASSESSMENT AND PLAN:    Sepsis due to pneumonia, With hypoxia Continue distal max in the Rocephin, f/u CBC and cultures.  Try to wean off oxygen [place].    A-fib. Rate is controlled, continue Coumadin and digoxin. Follow INR.  Anemia of chronic disease. Hemoglobin decreased from 8.5 to 7.3. No active bleeding. Follow-up stool occult and hemoglobin.   Chronic obstructive pulmonary disease. Stable, continue home inhalers Chronic diastolic CHF. Stable continue Lasix.  All the records are reviewed and case discussed with Care Management/Social Worker. Management plans discussed with the patient, his 2 sons and they are in agreement.  CODE STATUS: Partial  TOTAL TIME TAKING CARE OF THIS PATIENT: 37 minutes.   More than 50% of the  time was spent in counseling/coordination of care: YES  POSSIBLE D/C IN 2-3 DAYS, DEPENDING ON CLINICAL CONDITION.   Demetrios Loll M.D on 06/21/2016 at 5:00 PM  Between 7am to 6pm - Pager - [phone]  After 6pm go to [email or web address] - Proofreader  [facility]  Office  [phone]  CC: Primary care physician; Allyne Gee, MD  Note: This dictation was prepared with Dragon dictation along with smaller phrase technology. Any transcriptional errors that result from this process are unintentional.

## 2016-06-21 NOTE — Progress Notes (Signed)
While rounding, visited with Pt and his two son's as an introduction of myself and this Market researcher. Pt was pleasant but hard of hearing. I provided the ministry of presence and prayer. [facility] service is available 24/7 for follow up as needed.    06/21/16 1200  Clinical Encounter Type  Visited With Patient;Family;Patient and family together  Visit Type Initial;Spiritual support;Social support  Referral From Nurse  Spiritual Encounters  Spiritual Needs Prayer  Stress Factors  Patient Stress Factors Not reviewed

## 2016-06-21 NOTE — ED Notes (Signed)
Patient assisted to standing position to use urinal. Patient steady on his feet. Family back to bedside once complete.

## 2016-06-21 NOTE — Progress Notes (Signed)
Pharmacy Antibiotic Note  Thomas Townsend is a 82 y.o. male admitted on 06/20/2016 with pneumonia.  Pharmacy has been consulted for ceftriaxone dosing.  Plan: Ceftriaxone 1 gram q 24 hours ordered. Patient is aldo on azithromycin  Height: 5\' 7"  (170.2 cm) Weight: 145 lb (65.8 kg) IBW/kg (Calculated) : 66.1  Temp (24hrs), Avg:98.6 F (37 C), Min:97.5 F (36.4 C), Max:99.7 F (37.6 C)   Recent Labs Lab 06/20/16 1928 06/20/16 1949  WBC  --  30.0*  CREATININE  --  1.12  LATICACIDVEN 1.7  --     Estimated Creatinine Clearance: 47.3 mL/min (by C-G formula based on SCr of 1.12 mg/dL).    No Known Allergies  Antimicrobials this admission: ceftriaxone  >>  Azithromycin. >>   Dose adjustments this admission:   Microbiology results: 9/24 BCx: pending 9/24 UCx: pending     9/24 CXR: bibasilar opacities 9/24 UA: (-)  Thank you for allowing pharmacy to be a part of this patient's care.  Nero Sawatzky S 06/21/2016 3:05 AM

## 2016-06-21 NOTE — H&P (Signed)
[facility] at [place] NAME: Thomas Townsend    MR#:  [id number]  DATE OF BIRTH:  [birth year]  DATE OF ADMISSION:  06/20/2016  PRIMARY CARE PHYSICIAN: Allyne Gee, MD   REQUESTING/REFERRING PHYSICIAN: Alfred Levins, MD  CHIEF COMPLAINT:   Chief Complaint  Patient presents with  . Altered Mental Status    HISTORY OF PRESENT ILLNESS:  Thomas Townsend  is a 82 y.o. male who presents with An episode of confusion and hypoxia. Patient was at a grocery store when he began to feel bad and he sat down. He was able to tell the clerk his son's telephone number. Son called EMS who found the patient be hypoxic to the 60s. He was brought to the ED where he was found to meet sepsis criteria and to have pneumonia on chest x-ray. Hospitalists were called for admission  PAST MEDICAL HISTORY:   Past Medical History:  Diagnosis Date  . Arthritis   . Atrial fibrillation ([place])   . CHF (congestive heart failure) ([place])   . COPD (chronic obstructive pulmonary disease) ([place])   . Dysrhythmia   . GERD (gastroesophageal reflux disease)   . Hyperlipemia   . Hypertension   . Leukocytosis 01/14/2016  . Prostate cancer ([place])     PAST SURGICAL HISTORY:   Past Surgical History:  Procedure Laterality Date  . CHOLECYSTECTOMY    . COLONOSCOPY WITH PROPOFOL N/A 04/04/2015   Procedure: COLONOSCOPY WITH PROPOFOL;  Surgeon: Manya Silvas, MD;  Location: [facility] ENDOSCOPY;  Service: Endoscopy;  Laterality: N/A;  . ESOPHAGOGASTRODUODENOSCOPY N/A 04/04/2015   Procedure: ESOPHAGOGASTRODUODENOSCOPY (EGD);  Surgeon: Manya Silvas, MD;  Location[facility] END[facility]COPY;  Service: Endoscopy;  Laterality: N/A;    SOCIAL HISTORY:   Social History  Substance Use Topics  . Smoking status: Former Smoker    Years: 20.00    Types: Cigarettes  . Smokeless tobacco: Never Used  . Alcohol use 0.0 oz/week     Comment: Wine every once in a while    FAMILY HISTORY:   Family History   Problem Relation Age of Onset  . Hypertension Other     DRUG ALLERGIES:  No Known Allergies  MEDICATIONS AT HOME:   Prior to Admission medications   Medication Sig Start Date End Date Taking? Authorizing Provider  BREO ELLIPTA 100-25 MCG/INH AEPB Inhale 1 puff into the lungs every morning. 01/28/15   Historical Provider, MD  digoxin (LANOXIN) 0.125 MG tablet  02/22/14   Historical Provider, MD  ferrous sulfate 325 (65 FE) MG tablet Take 325 mg by mouth every morning. Reported on 02/25/2016    Historical Provider, MD  folic acid (FOLVITE) 1 MG tablet Take 1 mg by mouth daily.     Historical Provider, MD  furosemide (LASIX) 20 MG tablet Take 20 mg by mouth daily. In the morning    Historical Provider, MD  methotrexate (RHEUMATREX) 2.5 MG tablet TAKE 6 TABLETS (15 MG TOTAL) BY MOUTH EVERY 7 (SEVEN) DAYS. 12/24/15   Historical Provider, MD  montelukast (SINGULAIR) 10 MG tablet Take 1 tablet by mouth daily.     Historical Provider, MD  Multiple Vitamins-Minerals (PRESERVISION AREDS PO) Take 1 tablet by mouth daily.     Historical Provider, MD  warfarin (COUMADIN) 4 MG tablet Take 2-4 mg by mouth daily. 1/2 tab every other day and a whole tab on the other days.    Historical Provider, MD    REVIEW OF SYSTEMS:  Review of Systems  Constitutional: Negative for chills, fever, malaise/fatigue and weight loss.  HENT: Negative for ear pain, hearing loss and tinnitus.   Eyes: Negative for blurred vision, double vision, pain and redness.  Respiratory: Positive for cough and shortness of breath. Negative for hemoptysis.   Cardiovascular: Negative for chest pain, palpitations, orthopnea and leg swelling.  Gastrointestinal: Negative for abdominal pain, constipation, diarrhea, nausea and vomiting.  Genitourinary: Negative for dysuria, frequency and hematuria.  Musculoskeletal: Negative for back pain, joint pain and neck pain.  Skin:       No acne, rash, or lesions  Neurological: Negative for dizziness,  tremors, focal weakness and weakness.  Endo/Heme/Allergies: Negative for polydipsia. Does not bruise/bleed easily.  Psychiatric/Behavioral: Negative for depression. The patient is not nervous/anxious and does not have insomnia.      VITAL SIGNS:   Vitals:   06/20/16 2231 06/20/16 2330 06/21/16 0015 06/21/16 0045  BP: (!) 88/58 (!) 97/58 94/60 (!) 96/59  Pulse: (!) 126 (!) 119 (!) 109 (!) 107  Resp: (!) 27  (!) 21   Temp:      TempSrc:      SpO2: 91% 92% 93% 93%  Weight:      Height:       Wt Readings from Last 3 Encounters:  06/20/16 63.5 kg (140 lb)  02/26/16 66.7 kg (147 lb)  02/25/16 67.9 kg (149 lb 11.1 oz)    PHYSICAL EXAMINATION:  Physical Exam  Vitals reviewed. Constitutional: He is oriented to person, place, and time. He appears well-developed and well-nourished. No distress.  HENT:  Head: Normocephalic and atraumatic.  Mouth/Throat: Oropharynx is clear and moist.  Eyes: Conjunctivae and EOM are normal. Pupils are equal, round, and reactive to light. No scleral icterus.  Neck: Normal range of motion. Neck supple. No JVD present. No thyromegaly present.  Cardiovascular: Normal rate, regular rhythm and intact distal pulses.  Exam reveals no gallop and no friction rub.   No murmur heard. Respiratory: Effort normal. No respiratory distress. He has no wheezes. He has no rales.  Left basilar rhonchi  GI: Soft. Bowel sounds are normal. He exhibits no distension. There is no tenderness.  Musculoskeletal: Normal range of motion. He exhibits no edema.  No arthritis, no gout  Lymphadenopathy:    He has no cervical adenopathy.  Neurological: He is alert and oriented to person, place, and time. No cranial nerve deficit.  No dysarthria, no aphasia  Skin: Skin is warm and dry. No rash noted. No erythema.  Psychiatric: He has a normal mood and affect. His behavior is normal. Judgment and thought content normal.    LABORATORY PANEL:   CBC  Recent Labs Lab 06/20/16 1949   WBC 30.0*  HGB 8.5*  HCT 26.6*  PLT 269   ------------------------------------------------------------------------------------------------------------------  Chemistries   Recent Labs Lab 06/20/16 1949  NA 135  K 3.9  CL 106  CO2 25  GLUCOSE 113*  BUN 19  CREATININE 1.12  CALCIUM 8.5*  AST 23  ALT 15*  ALKPHOS 82  BILITOT 0.8   ------------------------------------------------------------------------------------------------------------------  Cardiac Enzymes  Recent Labs Lab 06/20/16 1949  TROPONINI <0.03   ------------------------------------------------------------------------------------------------------------------  RADIOLOGY:  Ct Head Wo Contrast  Result Date: 06/20/2016 CLINICAL DATA:  Altered mental status/confusion EXAM: CT HEAD WITHOUT CONTRAST TECHNIQUE: Contiguous axial images were obtained from the base of the skull through the vertex without intravenous contrast. COMPARISON:  None. FINDINGS: Brain: There is moderate diffuse atrophy. There is no intracranial mass, hemorrhage, extra-axial fluid collection, or midline shift. There  is mild patchy small vessel disease in the centra semiovale bilaterally. Elsewhere gray-white compartments appear normal. No acute infarct evident. Vascular: No hyperdense vessel. There is calcification in both carotid siphon regions. There is also calcification in the distal left vertebral artery. Skull: The bony calvarium appears intact. Sinuses/Orbits: There is mucosal thickening in several ethmoid air cells bilaterally. Visualized paranasal sinuses elsewhere clear. There is leftward deviation of the anterior nasal septum. Visualized orbits appear symmetric bilaterally. Other: There is mild thickening in several mastoid air cells inferiorly on the left. Mastoid air cells elsewhere clear. IMPRESSION: Moderate atrophy with mild periventricular small vessel disease. No intracranial mass, hemorrhage, or extra-axial fluid collection. No  acute infarct evident. There are foci of arterial vascular calcification. There is mucosal thickening in several ethmoid air cells. There is mild inferior left mastoid disease. There is leftward deviation of the anterior nasal septum. Electronically Signed   By: Lowella Grip III M.D.   On: 06/20/2016 21:10   Dg Chest Port 1 View  Result Date: 06/20/2016 CLINICAL DATA:  Acute onset of confusion.  Initial encounter. EXAM: PORTABLE CHEST 1 VIEW COMPARISON:  Chest radiograph performed 08/25/2015 FINDINGS: The lungs are well-aerated. Mild bibasilar airspace opacities are new from the prior study, and may reflect pneumonia or mild interstitial edema. There is no evidence of pleural effusion or pneumothorax. The cardiomediastinal silhouette is borderline enlarged. No acute osseous abnormalities are seen. IMPRESSION: Mild bibasilar airspace opacities are new from the prior study, and may reflect pneumonia or mild interstitial edema. Borderline cardiomegaly. Electronically Signed   By: Garald Balding M.D.   On: 06/20/2016 20:05    EKG:   Orders placed or performed during the hospital encounter of 06/20/16  . ED EKG 12-Lead  . ED EKG 12-Lead  . EKG 12-Lead  . EKG 12-Lead    IMPRESSION AND PLAN:  Principal Problem:   Sepsis ([place]) - IV antibiotics, cultures sent from the ED, sepsis due to his pneumonia, lactic acid was elevated so we will have IV fluids on board and trend lactate until normal, patient is hemodynamically stable Active Problems:   CAP (community acquired pneumonia) - IV antibiotics and cultures as above   A-fib ([place]) - continue home meds   Chronic obstructive pulmonary disease ([place]) - continue home inhalers  All the records are reviewed and case discussed with ED provider. Management plans discussed with the patient and/or family.  DVT PROPHYLAXIS: Systemic anticoagulation  GI PROPHYLAXIS: None  ADMISSION STATUS: Inpatient  CODE STATUS: Partial Code Status History    Date  Active Date Inactive Code Status Order ID Comments User Context   02/26/2016  2:34 PM 02/27/2016  3:03 PM Partial Code [id number]  Vaughan Basta, MD Inpatient   08/06/2015  2:59 AM 08/11/2015  3:21 PM Full Code [id number]  Lytle Butte, MD ED    Questions for Most Recent Historical Code Status (Order [id number])    Question Answer Comment   In the event of cardiac or respiratory ARREST: Initiate Code Blue, Call Rapid Response Yes    In the event of cardiac or respiratory ARREST: Perform CPR Yes    In the event of cardiac or respiratory ARREST: Perform Intubation/Mechanical Ventilation No    In the event of cardiac or respiratory ARREST: Use NIPPV/BiPAp only if indicated Yes    In the event of cardiac or respiratory ARREST: Administer ACLS medications if indicated Yes    In the event of cardiac or respiratory ARREST: Perform Defibrillation or Cardioversion if indicated Yes  Advance Directive Documentation   Flowsheet Row Most Recent Value  Type of Advance Directive  Healthcare Power of Attorney  Pre-existing out of facility DNR order (yellow form or pink MOST form)  No data  "MOST" Form in Place?  No data      TOTAL TIME TAKING CARE OF THIS PATIENT: 45 minutes.    Shaelee Forni FIELDING 06/21/2016, 1:05 AM  [facility]  Office  [phone]  CC: Primary care physician; Allyne Gee, MD

## 2016-06-21 NOTE — Care Management Important Message (Signed)
Important Message  Patient Details  Name: Thomas Townsend MRN: [id number] Date of Birth: 12-15-1930   Medicare Important Message Given:  Yes    Shelbie Ammons, RN 06/21/2016, 9:42 AM

## 2016-06-21 NOTE — Progress Notes (Signed)
ANTICOAGULATION CONSULT NOTE - Initial Consult  Pharmacy Consult for warfarin dosing Indication: atrial fibrillation  No Known Allergies  Patient Measurements: Height: 5\' 7"  (170.2 cm) Weight: 145 lb (65.8 kg) IBW/kg (Calculated) : 66.1 Heparin Dosing Weight: n/a  Vital Signs: Temp: 97.5 F (36.4 C) (09/25 0234) Temp Source: Oral (09/24 1939) BP: 98/51 (09/25 0234) Pulse Rate: 90 (09/25 0234)  Labs:  Recent Labs  06/20/16 1949  HGB 8.5*  HCT 26.6*  PLT 269  LABPROT 18.4*  INR 1.51  CREATININE 1.12  TROPONINI <0.03    Estimated Creatinine Clearance: 47.3 mL/min (by C-G formula based on SCr of 1.12 mg/dL).   Medical History: Past Medical History:  Diagnosis Date  . Arthritis   . Atrial fibrillation ([place])   . CHF (congestive heart failure) ([place])   . COPD (chronic obstructive pulmonary disease) ([place])   . Dysrhythmia   . GERD (gastroesophageal reflux disease)   . Hyperlipemia   . Hypertension   . Leukocytosis 01/14/2016  . Prostate cancer ([place])     Medications:  Patient's home dose is 2-4 mg every other day alternating.  Assessment: INR subtherapeutic on admission  Goal of Therapy:  INR 2-3 Monitor platelets by anticoagulation protocol: Yes   Plan:  Starting 3 mg daily which should be equivalent to home regimen. INR's daily while on antibiotics.  Donnell Wion S 06/21/2016,3:08 AM

## 2016-06-22 ENCOUNTER — Inpatient Hospital Stay: Payer: Medicare Other

## 2016-06-22 LAB — CBC
HCT: 23.5 % — ABNORMAL LOW (ref 40.0–52.0)
HEMOGLOBIN: 7.5 g/dL — AB (ref 13.0–18.0)
MCH: 31.8 pg (ref 26.0–34.0)
MCHC: 31.9 g/dL — ABNORMAL LOW (ref 32.0–36.0)
MCV: 99.4 fL (ref 80.0–100.0)
PLATELETS: 256 10*3/uL (ref 150–440)
RBC: 2.36 MIL/uL — AB (ref 4.40–5.90)
RDW: 21.5 % — ABNORMAL HIGH (ref 11.5–14.5)
WBC: 36.8 10*3/uL — AB (ref 3.8–10.6)

## 2016-06-22 LAB — PROTIME-INR
INR: 1.57
PROTHROMBIN TIME: 18.9 s — AB (ref 11.4–15.2)

## 2016-06-22 LAB — URINE CULTURE: Culture: NO GROWTH

## 2016-06-22 LAB — MAGNESIUM: MAGNESIUM: 1.8 mg/dL (ref 1.7–2.4)

## 2016-06-22 MED ORDER — IPRATROPIUM-ALBUTEROL 0.5-2.5 (3) MG/3ML IN SOLN
3.0000 mL | RESPIRATORY_TRACT | Status: DC
  Administered 2016-06-22 – 2016-06-23 (×6): 3 mL via RESPIRATORY_TRACT
  Filled 2016-06-22 (×6): qty 3

## 2016-06-22 MED ORDER — BUDESONIDE 0.5 MG/2ML IN SUSP
0.5000 mg | Freq: Two times a day (BID) | RESPIRATORY_TRACT | Status: DC
  Administered 2016-06-22 – 2016-06-26 (×8): 0.5 mg via RESPIRATORY_TRACT
  Filled 2016-06-22 (×8): qty 2

## 2016-06-22 MED ORDER — WARFARIN SODIUM 4 MG PO TABS
4.0000 mg | ORAL_TABLET | Freq: Every day | ORAL | Status: DC
  Administered 2016-06-22 – 2016-06-24 (×3): 4 mg via ORAL
  Filled 2016-06-22 (×3): qty 1

## 2016-06-22 MED ORDER — METHYLPREDNISOLONE SODIUM SUCC 40 MG IJ SOLR
40.0000 mg | Freq: Four times a day (QID) | INTRAMUSCULAR | Status: DC
  Administered 2016-06-22 – 2016-06-23 (×3): 40 mg via INTRAVENOUS
  Filled 2016-06-22 (×3): qty 1

## 2016-06-22 MED ORDER — WARFARIN - PHARMACIST DOSING INPATIENT
Freq: Every day | Status: DC
  Administered 2016-06-22 – 2016-06-24 (×3)

## 2016-06-22 NOTE — Progress Notes (Signed)
Dr. Bridgett Larsson notified patient HR has been fluctuating from 100's-130's A fib since returning to bed from bathroom 30 minutes ago. Oxygen increased back to 4L due to de-satting.   Dr. Bridgett Larsson acknowledged, no new orders received.

## 2016-06-22 NOTE — Progress Notes (Addendum)
On call MD, Dr. Verdell Carmine notified patient HR at rest maintaining 110-130's A fib. Oxygen saturation at rest 86 % on 4L. Oxygen increased to 5L saturations 91%.  Verbal order read back and verified for STAT ABG and Chest X-ray. Will call Dr. Verdell Carmine back with results.

## 2016-06-22 NOTE — Progress Notes (Signed)
Patient tolerated walking with nurse as standby assist 1 lap around nurses station without difficulty.   Oxygen decreased to 86% on 4L while walking but quickly recovered to 94% on 4L after sitting 1 minute after activity.

## 2016-06-22 NOTE — Care Management (Addendum)
Admitted to [facility] with the diagnosis of sepsis. Son is Thomas Townsend [phone]). Dr. Humphrey Rolls is listed as primary care physician. Hard of hearing. Does have Life Alert in the home. [facility] in the past. No skilled facility. No home oxygen. Prescriptions are filled at [facility] in [place]. Uses no aids for ambulation.  Takes care of all basic and instrumental activities of daily living himself, drives. No falls. Good appetite. Thomas Ammons RN MSN CCM Care Management [phone]

## 2016-06-22 NOTE — Progress Notes (Signed)
Dr. Bridgett Larsson made aware patient was increased to 4L oxygen overnight and WBC 36.8 this morning. Dr. Bridgett Larsson acknowledged and will order scheduled nebulizer treatments.

## 2016-06-22 NOTE — Progress Notes (Addendum)
[place] at [place] NAME: Thomas Townsend    MR#:  [id number]  DATE OF BIRTH:  December 15, 1930  SUBJECTIVE:  CHIEF COMPLAINT:   Chief Complaint  Patient presents with  . Altered Mental Status   The patient has mild cough and shortness of breath, on O2 [place] 3 L. Tachycardia on execration. REVIEW OF SYSTEMS:  Review of Systems  Constitutional: Positive for malaise/fatigue. Negative for chills and fever.  HENT: Negative for sore throat.   Eyes: Negative for blurred vision and double vision.  Respiratory: Positive for cough and shortness of breath. Negative for wheezing and stridor.   Cardiovascular: Negative for chest pain, palpitations and leg swelling.  Gastrointestinal: Negative for blood in stool, diarrhea, melena, nausea and vomiting.  Genitourinary: Negative for dysuria and urgency.  Musculoskeletal: Negative for joint pain.  Skin: Negative for itching and rash.  Neurological: Negative for dizziness, seizures, loss of consciousness and headaches.  Psychiatric/Behavioral: Negative for depression.    DRUG ALLERGIES:  No Known Allergies VITALS:  Blood pressure (!) 101/59, pulse (!) 104, temperature 98.8 F (37.1 C), temperature source Oral, resp. rate 18, height 5\' 7"  (1.702 m), weight 145 lb (65.8 kg), SpO2 90 %. PHYSICAL EXAMINATION:  Physical Exam  Constitutional: He is oriented to person, place, and time and well-developed, well-nourished, and in no distress. No distress.  HENT:  Head: Normocephalic.  Mouth/Throat: Oropharynx is clear and moist.  Eyes: Conjunctivae and EOM are normal. No scleral icterus.  Neck: Normal range of motion. Neck supple. No JVD present. No thyromegaly present.  Cardiovascular: Normal rate, regular rhythm and normal heart sounds.  Exam reveals no gallop.   No murmur heard. Pulmonary/Chest: Effort normal and breath sounds normal. No respiratory distress. He has no wheezes. He has no rales.  Crackles    Abdominal: He exhibits no distension. There is no tenderness.  Musculoskeletal: Normal range of motion. He exhibits no edema or tenderness.  Lymphadenopathy:    He has no cervical adenopathy.  Neurological: He is alert and oriented to person, place, and time. No cranial nerve deficit.  Skin: Skin is warm.  Psychiatric: Affect normal.   LABORATORY PANEL:   CBC  Recent Labs Lab 06/22/16 0418  WBC 36.8*  HGB 7.5*  HCT 23.5*  PLT 256   ------------------------------------------------------------------------------------------------------------------ Chemistries   Recent Labs Lab 06/20/16 1949 06/21/16 0426 06/22/16 0418  NA 135 138  --   K 3.9 3.7  --   CL 106 112*  --   CO2 25 23  --   GLUCOSE 113* 100*  --   BUN 19 15  --   CREATININE 1.12 0.92  --   CALCIUM 8.5* 7.5*  --   MG  --   --  1.8  AST 23  --   --   ALT 15*  --   --   ALKPHOS 82  --   --   BILITOT 0.8  --   --    RADIOLOGY:  No results found. ASSESSMENT AND PLAN:    Sepsis due to pneumonia,  Continue zithromax and Rocephin, NEB Q6h, f/u CBC and cultures.    Acute respiratory failure with hypoxia. Try to wean off oxygen [place]. NEB.   A-fib. Rate is controlled, continue Coumadin and digoxin. Follow INR.  Anemia of chronic disease. Hemoglobin decreased from 8.5 to 7.3. Repeat 7.5. No active bleeding. Follow-up stool occult and hemoglobin.   Chronic obstructive pulmonary disease. Stable, continue home inhalers Chronic diastolic  CHF. Stable continue Lasix.  All the records are reviewed and case discussed with Care Management/Social Worker. Management plans discussed with the patient, his 2 sons and they are in agreement.  CODE STATUS: Partial  TOTAL TIME TAKING CARE OF THIS PATIENT: 37 minutes.   More than 50% of the time was spent in counseling/coordination of care: YES  POSSIBLE D/C IN 2-3 DAYS, DEPENDING ON CLINICAL CONDITION.   Demetrios Loll M.D on 06/22/2016 at 2:42 PM  Between 7am to 6pm  - Pager - [phone]  After 6pm go to [email or web address] - Proofreader  [facility]  Office  [phone]  CC: Primary care physician; Allyne Gee, MD  Note: This dictation was prepared with Dragon dictation along with smaller phrase technology. Any transcriptional errors that result from this process are unintentional.

## 2016-06-22 NOTE — Progress Notes (Signed)
ANTICOAGULATION CONSULT NOTE - Follow Up Consult  Pharmacy Consult for Warfarin dosing Indication: atrial fibrillation  No Known Allergies  Patient Measurements: Height: 5\' 7"  (170.2 cm) Weight: 145 lb (65.8 kg) IBW/kg (Calculated) : 66.1 Heparin Dosing Weight:    Vital Signs: Temp: 98.7 F (37.1 C) (09/26 0805) Temp Source: Oral (09/26 0805) BP: 99/54 (09/26 0805) Pulse Rate: 85 (09/26 0805)  Labs:  Recent Labs  06/20/16 1949 06/21/16 0426 06/22/16 0418  HGB 8.5* 7.3* 7.5*  HCT 26.6* 21.6* 23.5*  PLT 269 219 256  LABPROT 18.4*  --  18.9*  INR 1.51  --  1.57  CREATININE 1.12 0.92  --   TROPONINI <0.03  --   --     Estimated Creatinine Clearance: 57.6 mL/min (by C-G formula based on SCr of 0.92 mg/dL).   Medications:  Prescriptions Prior to Admission  Medication Sig Dispense Refill Last Dose  . BREO ELLIPTA 100-25 MCG/INH AEPB Inhale 1 puff into the lungs every morning.   06/20/2016 at Unknown time  . digoxin (LANOXIN) 0.125 MG tablet    06/20/2016 at Unknown time  . furosemide (LASIX) 20 MG tablet Take 20 mg by mouth daily. In the morning   06/20/2016 at Unknown time  . methotrexate (RHEUMATREX) 2.5 MG tablet TAKE 6 TABLETS (15 MG TOTAL) BY MOUTH EVERY 7 (SEVEN) DAYS.  4 06/20/2016 at Unknown time  . montelukast (SINGULAIR) 10 MG tablet Take 1 tablet by mouth daily.    06/20/2016 at Unknown time  . Multiple Vitamins-Minerals (PRESERVISION AREDS PO) Take 1 tablet by mouth daily.    06/20/2016 at Unknown time  . warfarin (COUMADIN) 4 MG tablet Take 2-4 mg by mouth daily. 1/2 tab every other day and a whole tab on the other days.   06/20/2016 at Unknown time  . ferrous sulfate 325 (65 FE) MG tablet Take 325 mg by mouth every morning. Reported on 02/25/2016   Not Taking at Unknown time  . folic acid (FOLVITE) 1 MG tablet Take 1 mg by mouth daily.   11 Not Taking at Unknown time    Assessment: Patient admitted for pneumonia with history of afib. Was taking Warfarin 2mg   alternating every other day with 4mg  prior to admission. INR on admission was subtherapeutic at 1.51. Noted that patient received warfarin 4mg  on 9/23 but missed dose of 2mg  on 9/24.  9/24 INR: 1.51, missed usual dose of warfarin 9/25 no INR, warfarin 3mg  9/26 INR: 1.57  Goal of Therapy:  INR 2-3 Monitor platelets by anticoagulation protocol: Yes   Plan:  Will increase dose to warfarin 4mg  and check daily INR.  Paulina Fusi, PharmD, BCPS 06/22/2016 11:13 AM

## 2016-06-22 NOTE — Evaluation (Signed)
Physical Therapy Evaluation Patient Details Name: ZEYAD MARRA MRN: [id number] DOB: 21-Jan-1931 Today's Date: 06/22/2016   History of Present Illness  Pt admitted for sepsis. Pt with complaints of AMS and pneumonia. Pt with history of arthritis, Afib, COPD, GERD, and prostate cancer.   Clinical Impression  Pt is a pleasant 82 year old male who was admitted for sepsis. Pt performs transfers with independence without HHA and ambulation with supervision and HHA. All mobility performed on 4L of O2 with sats decreasing during ambulation, however quickly recover to 94% after seated rest break. Pt very motivated to perform therapy and endorses no falls. Pt demonstrates deficits with balance/cardiopulm deconditioning/mobility. Recommend gait trial at this time to improve balance. Pt able to demonstrate good power/speed with 5 time sit<>stand testing without use of hand rails. Would benefit from skilled PT to address above deficits and promote optimal return to PLOF. Recommend transition to [place] upon discharge from acute hospitalization.       Follow Up Recommendations Home health PT    Equipment Recommendations  None recommended by PT (pt reports he has RW)    Recommendations for Other Services       Precautions / Restrictions Precautions Precautions: Fall Restrictions Weight Bearing Restrictions: No      Mobility  Bed Mobility               General bed mobility comments: not tested as pt receiving during ambulation with RN  Transfers Overall transfer level: Independent Equipment used: None             General transfer comment: Pt performed 5 time sit<>Stand without use of hands, able to complete in 12 seconds. Slight fatigue and SOB symptomst noted. All transfers performed while on 4L of O2  Ambulation/Gait Ambulation/Gait assistance: Supervision Ambulation Distance (Feet): 200 Feet Assistive device: 1 person hand held assist Gait Pattern/deviations: Step-through  pattern     General Gait Details: Pt received in hallway ambulating with RN. Pt using HHA during ambulation with slight R foot crossing midline. SLight unsteadiness noted. All mobility performed while on 4L of O2 with sats decreasing to 86%. Once seated, sats improved to 94%.  Stairs            Wheelchair Mobility    Modified Rankin (Stroke Patients Only)       Balance Overall balance assessment: Needs assistance Sitting-balance support: Feet supported;No upper extremity supported Sitting balance-Leahy Scale: Normal     Standing balance support: Single extremity supported Standing balance-Leahy Scale: Fair                               Pertinent Vitals/Pain Pain Assessment: No/denies pain    Home Living Family/patient expects to be discharged to:: Private residence Living Arrangements: Children Available Help at Discharge: Family (son lives with him, works night shift) Type of Home: House Home Access: Stairs to enter Entrance Stairs-Rails: Can reach both Entrance Stairs-Number of Steps: 2 [place]: One [place]: Environmental consultant - 2 wheels;Cane - single point      Prior Function Level of Independence: Independent         Comments: Independent with ambulation, walks around farm, no falls.     Hand Dominance        Extremity/Trunk Assessment   Upper Extremity Assessment: Overall WFL for tasks assessed           Lower Extremity Assessment: Generalized weakness (B LE grossly 4/5)  Communication   Communication: No difficulties  Cognition Arousal/Alertness: Awake/alert Behavior During Therapy: WFL for tasks assessed/performed Overall Cognitive Status: Within Functional Limits for tasks assessed                      General Comments      Exercises Other Exercises Other Exercises: seated ther-ex performed while in recliner including B LE resisted LAQ, hip add squeezes, and sit<>stand training. Ther-ex  performed x 10 reps with supervision. Slight SOB symptoms noted with exertion.   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 PT Goal Formulation: With patient Time For Goal Achievement: 07/06/16 Potential to Achieve Goals: Good    Frequency Min 2X/week   Barriers to discharge        Co-evaluation               End of Session Equipment Utilized During Treatment: Gait belt;Oxygen Activity Tolerance: Patient limited by fatigue Patient left: in chair;with chair alarm set Nurse Communication: Mobility status         Time: [id number] PT Time Calculation (min) (ACUTE ONLY): 18 min   Charges:   PT Evaluation $PT Eval Low Complexity: 1 Procedure PT Treatments $Therapeutic Exercise: 8-22 mins   PT G Codes:        Zekiah Coen 2016-07-02, 10:28 AM Greggory Stallion, PT, DPT [phone]

## 2016-06-22 NOTE — Progress Notes (Signed)
Dr. Verdell Carmine notified of chest x ray and abg results. MD verified oxygen was increased. Will continue to monitor.

## 2016-06-23 LAB — CBC
HCT: 28.2 % — ABNORMAL LOW (ref 40.0–52.0)
Hemoglobin: 9.2 g/dL — ABNORMAL LOW (ref 13.0–18.0)
MCH: 32.4 pg (ref 26.0–34.0)
MCHC: 32.7 g/dL (ref 32.0–36.0)
MCV: 99 fL (ref 80.0–100.0)
PLATELETS: 285 10*3/uL (ref 150–440)
RBC: 2.85 MIL/uL — AB (ref 4.40–5.90)
RDW: 21 % — AB (ref 11.5–14.5)
WBC: 37.4 10*3/uL — AB (ref 3.8–10.6)

## 2016-06-23 LAB — PROTIME-INR
INR: 1.52
PROTHROMBIN TIME: 18.5 s — AB (ref 11.4–15.2)

## 2016-06-23 LAB — BRAIN NATRIURETIC PEPTIDE: B Natriuretic Peptide: 254 pg/mL — ABNORMAL HIGH (ref 0.0–100.0)

## 2016-06-23 MED ORDER — IPRATROPIUM-ALBUTEROL 0.5-2.5 (3) MG/3ML IN SOLN
3.0000 mL | Freq: Four times a day (QID) | RESPIRATORY_TRACT | Status: DC
  Administered 2016-06-23 – 2016-06-24 (×4): 3 mL via RESPIRATORY_TRACT
  Filled 2016-06-23 (×5): qty 3

## 2016-06-23 MED ORDER — DIGOXIN 250 MCG PO TABS
0.2500 mg | ORAL_TABLET | Freq: Once | ORAL | Status: AC
  Administered 2016-06-23: 11:00:00 0.25 mg via ORAL
  Filled 2016-06-23: qty 1

## 2016-06-23 MED ORDER — METHYLPREDNISOLONE SODIUM SUCC 40 MG IJ SOLR
40.0000 mg | Freq: Two times a day (BID) | INTRAMUSCULAR | Status: DC
  Administered 2016-06-23 – 2016-06-24 (×2): 40 mg via INTRAVENOUS
  Filled 2016-06-23 (×2): qty 1

## 2016-06-23 NOTE — Progress Notes (Signed)
ANTICOAGULATION CONSULT NOTE - Follow Up Consult  Pharmacy Consult for Warfarin dosing Indication: atrial fibrillation  No Known Allergies  Patient Measurements: Height: 5\' 7"  (170.2 cm) Weight: 145 lb (65.8 kg) IBW/kg (Calculated) : 66.1 Heparin Dosing Weight:    Vital Signs: Temp: 97.4 F (36.3 C) (09/27 0529) Temp Source: Oral (09/27 0529) BP: 103/67 (09/27 0529) Pulse Rate: 132 (09/27 0855)  Labs:  Recent Labs  06/20/16 1949 06/21/16 0426 06/22/16 0418 06/23/16 0425  HGB 8.5* 7.3* 7.5* 9.2*  HCT 26.6* 21.6* 23.5* 28.2*  PLT 269 219 256 285  LABPROT 18.4*  --  18.9* 18.5*  INR 1.51  --  1.57 1.52  CREATININE 1.12 0.92  --   --   TROPONINI <0.03  --   --   --     Estimated Creatinine Clearance: 57.6 mL/min (by C-G formula based on SCr of 0.92 mg/dL).   Medications:  Prescriptions Prior to Admission  Medication Sig Dispense Refill Last Dose  . BREO ELLIPTA 100-25 MCG/INH AEPB Inhale 1 puff into the lungs every morning.   06/20/2016 at Unknown time  . digoxin (LANOXIN) 0.125 MG tablet    06/20/2016 at Unknown time  . furosemide (LASIX) 20 MG tablet Take 20 mg by mouth daily. In the morning   06/20/2016 at Unknown time  . methotrexate (RHEUMATREX) 2.5 MG tablet TAKE 6 TABLETS (15 MG TOTAL) BY MOUTH EVERY 7 (SEVEN) DAYS.  4 06/20/2016 at Unknown time  . montelukast (SINGULAIR) 10 MG tablet Take 1 tablet by mouth daily.    06/20/2016 at Unknown time  . Multiple Vitamins-Minerals (PRESERVISION AREDS PO) Take 1 tablet by mouth daily.    06/20/2016 at Unknown time  . warfarin (COUMADIN) 4 MG tablet Take 2-4 mg by mouth daily. 1/2 tab every other day and a whole tab on the other days.   06/20/2016 at Unknown time  . ferrous sulfate 325 (65 FE) MG tablet Take 325 mg by mouth every morning. Reported on 02/25/2016   Not Taking at Unknown time  . folic acid (FOLVITE) 1 MG tablet Take 1 mg by mouth daily.   11 Not Taking at Unknown time    Assessment: Patient admitted for pneumonia  with history of afib. Was taking Warfarin 2mg  alternating every other day with 4mg  prior to admission. INR on admission was subtherapeutic at 1.51. Noted that patient received warfarin 4mg  on 9/23 but missed dose of 2mg  on 9/24.  9/24 INR: 1.51, missed usual dose of warfarin 9/25 no INR, warfarin 3mg  9/26 INR: 1.57, warfarin 4mg  9/27 INR: 1.52  Goal of Therapy:  INR 2-3 Monitor platelets by anticoagulation protocol: Yes   Plan:  Will continue with current dose of warfarin 4mg  and check daily INR.   Paulina Fusi, PharmD, BCPS 06/23/2016 10:44 AM

## 2016-06-23 NOTE — Progress Notes (Signed)
[facility] at [place] NAME: Thomas Townsend    MR#:  [id number]  DATE OF BIRTH:  [birth year]  SUBJECTIVE:  CHIEF COMPLAINT:   Chief Complaint  Patient presents with  . Altered Mental Status   Feels better with breathing. Dry cough. No chest pain Afebrile. Not on home oxygen. More oxygen need today at 5 L. REVIEW OF SYSTEMS:  Review of Systems  Constitutional: Positive for malaise/fatigue. Negative for chills and fever.  HENT: Negative for sore throat.   Eyes: Negative for blurred vision and double vision.  Respiratory: Positive for cough and shortness of breath. Negative for wheezing and stridor.   Cardiovascular: Negative for chest pain, palpitations and leg swelling.  Gastrointestinal: Negative for blood in stool, diarrhea, melena, nausea and vomiting.  Genitourinary: Negative for dysuria and urgency.  Musculoskeletal: Negative for joint pain.  Skin: Negative for itching and rash.  Neurological: Negative for dizziness, seizures, loss of consciousness and headaches.  Psychiatric/Behavioral: Negative for depression.    DRUG ALLERGIES:  No Known Allergies VITALS:  Blood pressure 103/67, pulse (!) 132, temperature 97.4 F (36.3 C), temperature source Oral, resp. rate (!) 23, height '5\' 7"'  (1.702 m), weight 65.8 kg (145 lb), SpO2 98 %. PHYSICAL EXAMINATION:  Physical Exam  Constitutional: He is oriented to person, place, and time and well-developed, well-nourished, and in no distress. No distress.  HENT:  Head: Normocephalic.  Mouth/Throat: Oropharynx is clear and moist.  Eyes: Conjunctivae and EOM are normal. No scleral icterus.  Neck: Normal range of motion. Neck supple. No JVD present. No thyromegaly present.  Cardiovascular: Normal heart sounds.  Exam reveals no gallop.   No murmur heard. Irregularly irregular with tachycardia  Pulmonary/Chest: Effort normal and breath sounds normal. No respiratory distress. He has no wheezes.  He has no rales.  Decreased air entry bilaterally  Abdominal: He exhibits no distension. There is no tenderness.  Musculoskeletal: Normal range of motion. He exhibits no edema or tenderness.  Lymphadenopathy:    He has no cervical adenopathy.  Neurological: He is alert and oriented to person, place, and time. No cranial nerve deficit.  Skin: Skin is warm.  Psychiatric: Affect normal.   LABORATORY PANEL:   CBC  Recent Labs Lab 06/23/16 0425  WBC 37.4*  HGB 9.2*  HCT 28.2*  PLT 285   ------------------------------------------------------------------------------------------------------------------ Chemistries   Recent Labs Lab 06/20/16 1949 06/21/16 0426 06/22/16 0418  NA 135 138  --   K 3.9 3.7  --   CL 106 112*  --   CO2 25 23  --   GLUCOSE 113* 100*  --   BUN 19 15  --   CREATININE 1.12 0.92  --   CALCIUM 8.5* 7.5*  --   MG  --   --  1.8  AST 23  --   --   ALT 15*  --   --   ALKPHOS 82  --   --   BILITOT 0.8  --   --    RADIOLOGY:  Dg Chest 1 View  Result Date: 06/22/2016 CLINICAL DATA:  Shortness of breath, acute onset. Current history of atrial fibrillation. Initial encounter. EXAM: CHEST 1 VIEW COMPARISON:  Chest radiograph performed 06/20/2016 FINDINGS: The lungs are well-aerated. Patchy bilateral midlung and left basilar airspace opacities raise concern for pneumonia. Mild interstitial edema might have a similar appearance. No pleural effusion or pneumothorax is seen. The cardiomediastinal silhouette is within normal limits. No acute osseous abnormalities are seen.  IMPRESSION: Patchy bilateral midlung and left basilar airspace opacities raise concern for pneumonia. Mild interstitial edema might have a similar appearance. Electronically Signed   By: Garald Balding M.D.   On: 06/22/2016 18:53   ASSESSMENT AND PLAN:   * Bilateral pneumonia with acute hypoxic respiratory failure and sepsis On IV antibiotics. Cultures pending. Wean oxygen as tolerated. Patient is  on 5 L oxygen today. Needs continued inpatient treatment.  * A-fib with RVR On PO digoxin Will give extra dose  * Anemia of chronic disease. Hemoglobin decreased from 8.5 to 7.3. Repeat 7.5. No active bleeding. Follow-up stool occult and hemoglobin.  * Chronic obstructive pulmonary disease exacerbation Reduce steroids Nebs PRN breo  * Leukocytosis Patient has baseline high WBC count. Presently worse due to pneumonia. He has an appointment at the cancer center for further workup including blood work and bone marrow biopsy.  * Chronic diastolic CHF. Stable continue Lasix.  * DVT prophylaxis with Lovenox  All the records are reviewed and case discussed with Care Management/Social Worker.  CODE STATUS: Partial  TOTAL TIME TAKING CARE OF THIS PATIENT: 30 minutes.   POSSIBLE D/C IN 2-3 DAYS, DEPENDING ON CLINICAL CONDITION.  Hillary Bow R M.D on 06/23/2016 at 10:00 AM  Between 7am to 6pm - Pager - [phone]  After 6pm go to [email or web address] - Proofreader  [facility]  Office  [phone]  CC: Primary care physician; Allyne Gee, MD  Note: This dictation was prepared with Dragon dictation along with smaller phrase technology. Any transcriptional errors that result from this process are unintentional.

## 2016-06-23 NOTE — Care Management Important Message (Signed)
Important Message  Patient Details  Name: Thomas Townsend MRN: [id number] Date of Birth: 12-17-1930   Medicare Important Message Given:  Yes    Shelbie Ammons, RN 06/23/2016, 8:46 AM

## 2016-06-24 ENCOUNTER — Other Ambulatory Visit: Payer: Self-pay | Admitting: *Deleted

## 2016-06-24 ENCOUNTER — Inpatient Hospital Stay: Payer: Medicare Other

## 2016-06-24 ENCOUNTER — Encounter: Payer: Self-pay | Admitting: Radiology

## 2016-06-24 LAB — CBC WITH DIFFERENTIAL/PLATELET
BASOS ABS: 0 10*3/uL (ref 0–0.1)
Basophils Relative: 0 %
EOS ABS: 0 10*3/uL (ref 0–0.7)
Eosinophils Relative: 0 %
HEMATOCRIT: 24.9 % — AB (ref 40.0–52.0)
HEMOGLOBIN: 8 g/dL — AB (ref 13.0–18.0)
LYMPHS PCT: 3 %
Lymphs Abs: 1.6 10*3/uL (ref 1.0–3.6)
MCH: 31.7 pg (ref 26.0–34.0)
MCHC: 32.1 g/dL (ref 32.0–36.0)
MCV: 98.7 fL (ref 80.0–100.0)
MONO ABS: 7.8 10*3/uL — AB (ref 0.2–1.0)
Monocytes Relative: 15 %
NEUTROS PCT: 82 %
Neutro Abs: 42.9 10*3/uL — ABNORMAL HIGH (ref 1.4–6.5)
Platelets: 330 10*3/uL (ref 150–440)
RBC: 2.52 MIL/uL — ABNORMAL LOW (ref 4.40–5.90)
RDW: 20.9 % — ABNORMAL HIGH (ref 11.5–14.5)
WBC: 52.3 10*3/uL — AB (ref 3.8–10.6)

## 2016-06-24 LAB — BLOOD GAS, ARTERIAL
ACID-BASE EXCESS: 4.7 mmol/L — AB (ref 0.0–2.0)
Bicarbonate: 27.6 mmol/L (ref 20.0–28.0)
FIO2: 0.4
O2 Saturation: 92.1 %
PCO2 ART: 33 mmHg (ref 32.0–48.0)
PH ART: 7.53 — AB (ref 7.350–7.450)
Patient temperature: 37
pO2, Arterial: 56 mmHg — ABNORMAL LOW (ref 83.0–108.0)

## 2016-06-24 LAB — BASIC METABOLIC PANEL
ANION GAP: 10 (ref 5–15)
BUN: 27 mg/dL — ABNORMAL HIGH (ref 6–20)
CALCIUM: 8.6 mg/dL — AB (ref 8.9–10.3)
CO2: 23 mmol/L (ref 22–32)
CREATININE: 0.98 mg/dL (ref 0.61–1.24)
Chloride: 105 mmol/L (ref 101–111)
Glucose, Bld: 146 mg/dL — ABNORMAL HIGH (ref 65–99)
Potassium: 3.8 mmol/L (ref 3.5–5.1)
SODIUM: 138 mmol/L (ref 135–145)

## 2016-06-24 LAB — TSH: TSH: 0.566 u[IU]/mL (ref 0.350–4.500)

## 2016-06-24 LAB — PROTIME-INR
INR: 1.99
PROTHROMBIN TIME: 22.9 s — AB (ref 11.4–15.2)

## 2016-06-24 MED ORDER — PREDNISONE 20 MG PO TABS: 20.0000 mg | ORAL_TABLET | Freq: Every day | ORAL | Status: DC

## 2016-06-24 MED ORDER — PREDNISONE 20 MG PO TABS
60.0000 mg | ORAL_TABLET | Freq: Every day | ORAL | Status: AC
  Administered 2016-06-25: 60 mg via ORAL
  Filled 2016-06-24: qty 3

## 2016-06-24 MED ORDER — PREDNISONE 20 MG PO TABS: 30.0000 mg | ORAL_TABLET | Freq: Every day | ORAL | Status: DC

## 2016-06-24 MED ORDER — ALUM & MAG HYDROXIDE-SIMETH 200-200-20 MG/5ML PO SUSP
30.0000 mL | Freq: Three times a day (TID) | ORAL | Status: DC | PRN
  Administered 2016-06-24: 21:00:00 30 mL via ORAL
  Filled 2016-06-24: qty 30

## 2016-06-24 MED ORDER — PREDNISONE 10 MG PO TABS: 10.0000 mg | ORAL_TABLET | Freq: Every day | ORAL | Status: DC

## 2016-06-24 MED ORDER — PREDNISONE 20 MG PO TABS: 40.0000 mg | ORAL_TABLET | Freq: Every day | ORAL | Status: DC

## 2016-06-24 MED ORDER — IOPAMIDOL (ISOVUE-300) INJECTION 61%
75.0000 mL | Freq: Once | INTRAVENOUS | Status: AC | PRN
  Administered 2016-06-24: 18:00:00 75 mL via INTRAVENOUS

## 2016-06-24 MED ORDER — PREDNISONE 10 MG PO TABS
50.0000 mg | ORAL_TABLET | Freq: Every day | ORAL | Status: AC
  Administered 2016-06-26: 09:00:00 50 mg via ORAL
  Filled 2016-06-24: qty 1

## 2016-06-24 MED ORDER — IPRATROPIUM-ALBUTEROL 0.5-2.5 (3) MG/3ML IN SOLN
3.0000 mL | Freq: Three times a day (TID) | RESPIRATORY_TRACT | Status: DC
  Administered 2016-06-24 – 2016-06-26 (×6): 3 mL via RESPIRATORY_TRACT
  Filled 2016-06-24 (×6): qty 3

## 2016-06-24 NOTE — Progress Notes (Signed)
Ch made a follow up visit with Pt. Was in good spirits but disappointed as (Per Pt) his white cell count was still high. We spoke in general conversation for a bit when He asked If I would have a prayer with him. I prayed a prayer thankful for God's presence, the hospital staff, and for healing in spirit. Pt was very appreciative and said that he felt better than he had all day. CH is available for follow up as needed.    06/24/16 1400  Clinical Encounter Type  Visited With Patient  Visit Type Follow-up;Spiritual support  Referral From Nurse  Spiritual Encounters  Spiritual Needs Prayer;Emotional  Stress Factors  Patient Stress Factors Health changes

## 2016-06-24 NOTE — Plan of Care (Signed)
Problem: Physical Regulation: Goal: Ability to maintain clinical measurements within normal limits will improve Outcome: Progressing Oxygen decreased to 3L nasal cannula with stable saturations today.

## 2016-06-24 NOTE — Progress Notes (Signed)
Patient's daughter, [name] [id number]), called to notify staff Mr. Aber choked on a piece of steak a couple weeks ago and family had to do heimlich maneuver to clear airway.

## 2016-06-24 NOTE — Progress Notes (Signed)
Physical Therapy Treatment Patient Details Name: Thomas Townsend MRN: [id number] DOB: 12-09-1930 Today's Date: 06/24/2016    History of Present Illness Pt admitted for sepsis. Pt with complaints of AMS and pneumonia. Pt with history of arthritis, Afib, COPD, GERD, and prostate cancer.     PT Comments    Pt is making good progress towards goals with improved ambulation this date. Pt with improved balance using RW for stability and improved endurance. Pt on 3L of O2 for all mobility with sats >88%. Pt demonstrates increased speed this session. Pt also able to carry conversation during ambulation. Will continue efforts as able.   Follow Up Recommendations  Home health PT     Equipment Recommendations  None recommended by PT    Recommendations for Other Services       Precautions / Restrictions Precautions Precautions: Fall Restrictions Weight Bearing Restrictions: No    Mobility  Bed Mobility Overal bed mobility: Modified Independent             General bed mobility comments: safe technique as pt able to scoot up in bed for positioning and perform supine->sit with safe technique  Transfers Overall transfer level: Modified independent Equipment used: Rolling walker (2 wheeled)             General transfer comment: Safe technique with upright posture noted. No LOB once standing with RW  Ambulation/Gait Ambulation/Gait assistance: Supervision Ambulation Distance (Feet): 350 Feet Assistive device: Rolling walker (2 wheeled) Gait Pattern/deviations: Step-through pattern     General Gait Details: improved ambulation distance this session with increased gait speed, ambulating 10' in 5 seconds. All mobility performed with 3L of O2 donned with sats at 88% with exertion. With standing rest break, sats improve to 94%.  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 PT Goal Formulation: With patient Time For Goal Achievement: 07/06/16 Potential to Achieve Goals: Good Progress towards PT goals: Progressing toward goals    Frequency    Min 2X/week      PT Plan Current plan remains appropriate    Co-evaluation             End of Session Equipment Utilized During Treatment: Gait belt;Oxygen Activity Tolerance: Patient tolerated treatment well Patient left: in bed;with bed alarm set     Time: [id number] PT Time Calculation (min) (ACUTE ONLY): 14 min  Charges:  $Gait Training: 8-22 mins  G Codes:      [place] Alia 06/24/2016, 5:18 PM  Greggory Stallion, PT, DPT [phone]

## 2016-06-24 NOTE — Progress Notes (Signed)
ANTICOAGULATION CONSULT NOTE - Follow Up Consult  Pharmacy Consult for Warfarin dosing Indication: atrial fibrillation  No Known Allergies  Patient Measurements: Height: 5\' 7"  (170.2 cm) Weight: 145 lb (65.8 kg) IBW/kg (Calculated) : 66.1 Heparin Dosing Weight:    Vital Signs: Temp: 96.9 F (36.1 C) (09/28 0605) Temp Source: Axillary (09/28 0605) BP: 102/60 (09/28 0834) Pulse Rate: 87 (09/28 0834)  Labs:  Recent Labs  06/22/16 0418 06/23/16 0425 06/24/16 0514  HGB 7.5* 9.2* 8.0*  HCT 23.5* 28.2* 24.9*  PLT 256 285 330  LABPROT 18.9* 18.5* 22.9*  INR 1.57 1.52 1.99  CREATININE  --   --  0.98    Estimated Creatinine Clearance: 54.1 mL/min (by C-G formula based on SCr of 0.98 mg/dL).   Medications:  Prescriptions Prior to Admission  Medication Sig Dispense Refill Last Dose  . BREO ELLIPTA 100-25 MCG/INH AEPB Inhale 1 puff into the lungs every morning.   06/20/2016 at Unknown time  . digoxin (LANOXIN) 0.125 MG tablet Take 0.125 mg by mouth every morning.    06/20/2016 at Unknown time  . ferrous sulfate 325 (65 FE) MG tablet Take 325 mg by mouth every morning. Reported on 02/25/2016   06/20/2016  . furosemide (LASIX) 20 MG tablet Take 20 mg by mouth every other day. In the morning   06/20/2016 at Unknown time  . ibuprofen (ADVIL) 200 MG tablet Take 400 mg by mouth every morning.   06/20/2016  . methotrexate (RHEUMATREX) 2.5 MG tablet TAKE 6 TABLETS (15 MG TOTAL) BY MOUTH EVERY 7 (SEVEN) DAYS.  4 06/20/2016 at 0600  . montelukast (SINGULAIR) 10 MG tablet Take 1 tablet by mouth every morning.    06/20/2016 at Unknown time  . Multiple Vitamins-Minerals (PRESERVISION AREDS PO) Take 1 tablet by mouth every morning.    06/20/2016 at Unknown time  . warfarin (COUMADIN) 4 MG tablet Take 4 mg by mouth every morning.    06/20/2016 at 0600  . folic acid (FOLVITE) 1 MG tablet Take 1 mg by mouth daily.   11 Not Taking at Unknown time    Assessment: Patient admitted for pneumonia with  history of afib. Was taking Warfarin 2mg  alternating every other day with 4mg  prior to admission. INR on admission was subtherapeutic at 1.51. Noted that patient received warfarin 4mg  on 9/23 but missed dose of 2mg  on 9/24.  9/24 INR: 1.51, missed usual dose of warfarin 9/25 no INR, warfarin 3mg  9/26 INR: 1.57, warfarin 4mg  9/27 INR: 1.52, warfarin 4mg  9/28 INR: 1.99  Goal of Therapy:  INR 2-3 Monitor platelets by anticoagulation protocol: Yes   Plan:  Will continue with current dose of warfarin 4mg  and check daily INR.   Paulina Fusi, PharmD, BCPS 06/24/2016 10:19 AM

## 2016-06-24 NOTE — Progress Notes (Signed)
[place] [facility] [place] NAME: Thomas Townsend    MR#:  [id number]  DATE OF BIRTH:  [birth year]  SUBJECTIVE:  CHIEF COMPLAINT:   Chief Complaint  Patient presents with  . Altered Mental Status   Feels better with breathing. Dry cough. No chest pain Afebrile. Not on home oxygen. More oxygen need 4 ltr. No oxygen required before this admission.  REVIEW OF SYSTEMS:  Review of Systems  Constitutional: Positive for malaise/fatigue. Negative for chills and fever.  HENT: Negative for sore throat.   Eyes: Negative for blurred vision and double vision.  Respiratory: Positive for cough and shortness of breath. Negative for wheezing and stridor.   Cardiovascular: Negative for chest pain, palpitations and leg swelling.  Gastrointestinal: Negative for blood in stool, diarrhea, melena, nausea and vomiting.  Genitourinary: Negative for dysuria and urgency.  Musculoskeletal: Negative for joint pain.  Skin: Negative for itching and rash.  Neurological: Negative for dizziness, seizures, loss of consciousness and headaches.  Psychiatric/Behavioral: Negative for depression.    DRUG ALLERGIES:  No Known Allergies VITALS:  Blood pressure (!) 106/56, pulse (!) 50, temperature 98.6 F (37 C), resp. rate 18, height 5\' 7"  (1.702 m), weight 65.8 kg (145 lb), SpO2 97 %. PHYSICAL EXAMINATION:  Physical Exam  Constitutional: He is oriented to person, place, and time and well-developed, well-nourished, and in no distress. No distress.  HENT:  Head: Normocephalic.  Mouth/Throat: Oropharynx is clear and moist.  Eyes: Conjunctivae and EOM are normal. No scleral icterus.  Neck: Normal range of motion. Neck supple. No JVD present. No thyromegaly present.  Cardiovascular: Normal heart sounds.  Exam reveals no gallop.   No murmur heard. Irregularly irregular with tachycardia  Pulmonary/Chest: Effort normal and breath sounds normal. No respiratory distress. He has no  wheezes. He has no rales.  Decreased air entry bilaterally  Abdominal: He exhibits no distension. There is no tenderness.  Musculoskeletal: Normal range of motion. He exhibits no edema or tenderness.  Lymphadenopathy:    He has no cervical adenopathy.  Neurological: He is alert and oriented to person, place, and time. No cranial nerve deficit.  Skin: Skin is warm.  Psychiatric: Affect normal.   LABORATORY PANEL:   CBC  Recent Labs Lab 06/24/16 0514  WBC 52.3*  HGB 8.0*  HCT 24.9*  PLT 330   ------------------------------------------------------------------------------------------------------------------ Chemistries   Recent Labs Lab 06/20/16 1949  06/22/16 0418 06/24/16 0514  NA 135  < >  --  138  K 3.9  < >  --  3.8  CL 106  < >  --  105  CO2 25  < >  --  23  GLUCOSE 113*  < >  --  146*  BUN 19  < >  --  27*  CREATININE 1.12  < >  --  0.98  CALCIUM 8.5*  < >  --  8.6*  MG  --   --  1.8  --   AST 23  --   --   --   ALT 15*  --   --   --   ALKPHOS 82  --   --   --   BILITOT 0.8  --   --   --   < > = values in this interval not displayed. RADIOLOGY:  Dg Chest 2 View  Result Date: 06/24/2016 CLINICAL DATA:  Hospitalized for pneumonia 1 week ago. Dry cough. Oxygen requirement. EXAM: CHEST  2 VIEW COMPARISON:  06/22/2016 FINDINGS: There is increased right paratracheal and left paratracheal density, suspicious for superior mediastinal mass or thyroid enlargement. Heart is upper limits normal and accentuated by the AP position of patient. There are mildly prominent interstitial markings there has been some improvement in aeration of the lung bases. No new consolidations or pleural effusions are identified. IMPRESSION: 1. Suspect mild interstitial edema. 2. Question of thyroid enlargement versus mediastinal mass. Recommend further evaluation. Initial evaluation with PA and lateral chest x-ray is recommended. If density persists, CT of the chest is recommended. Intravenous  contrast is recommended unless it is contraindicated. These results will be called to the ordering clinician or representative by the Radiologist Assistant, and communication documented in the PACS or zVision Dashboard. Electronically Signed   By: Nolon Nations M.D.   On: 06/24/2016 11:00   ASSESSMENT AND PLAN:   * Bilateral pneumonia with acute hypoxic respiratory failure and sepsis On IV antibiotics. Cultures pending. Wean oxygen as tolerated. Patient is on 4 L oxygen today. Needs continued inpatient treatment. Repeat Xray chest showed possible thyroid mass   Get CT chest with contrast.  * A-fib with RVR On PO digoxin  now Controlled.  * Anemia of chronic disease. Hemoglobin decreased from 8.5 to 7.3. Repeat 7.5. No active bleeding. Follow-up stool occult and hemoglobin.  * Chronic obstructive pulmonary disease exacerbation Reduce steroids- switch to oral. Nebs PRN breo  * Leukocytosis Patient has baseline high WBC count. Presently worse due to pneumonia. Pt had out pt aout pt appointment with cancer center- but now as WBCs are very high- called inpatient consult.  * Chronic diastolic CHF. Stable continue Lasix.  * DVT prophylaxis with Lovenox  All the records are reviewed and case discussed with Care Management/Social Worker.  CODE STATUS: Partial  TOTAL TIME TAKING CARE OF THIS PATIENT: 30 minutes.   POSSIBLE D/C IN 2-3 DAYS, DEPENDING ON CLINICAL CONDITION.  Vaughan Basta M.D on 06/24/2016 at 3:43 PM  Between 7am to 6pm - Pager - [phone]  After 6pm go to [email or web address] - Proofreader  [facility]  Office  [phone]  CC: Primary care physician; Allyne Gee, MD  Note: This dictation was prepared with Dragon dictation along with smaller phrase technology. Any transcriptional errors that result from this process are unintentional.

## 2016-06-24 NOTE — Consult Note (Signed)
   [facility] CM Inpatient Consult   06/24/2016  Thomas Townsend Sep 19, 1931 [id number]   Chart review revealed patient eligible for [place] Management services and post hospital discharge follow up related to a diagnosis of COPD and HF. Patient was evaluated for community based chronic disease management services with [facility] care Management Program as a benefit of patient's [employer]. Met with the patient at the bedside to explain [place] Management services. Patient endorses his primary care provider to be Dr. Devona Konig. Consent form signed. Patient gave [phone] as the best number to reach him and stated he was very heard of hearing so please speak up when calling. He also gave written permission to call his daughter [name] at [phone], but asked that we be respectful of the fact she works during the day and if he can not be reached.Patient will receive post hospital discharge calls and be evaluated for monthly home visits. [facility] Care Management services does not interfere with or replace any services arranged by the inpatient care management team. RNCM left contact information and [facility] literature at the bedside. Made inpatient RNCM aware that [facility] will be following for care management. For additional questions please contact:   Hanley Rispoli RN, [facility] Liaison  [phone]) Business Mobile [phone]) Toll free office

## 2016-06-25 DIAGNOSIS — D72829 Elevated white blood cell count, unspecified: Secondary | ICD-10-CM

## 2016-06-25 DIAGNOSIS — D649 Anemia, unspecified: Secondary | ICD-10-CM

## 2016-06-25 DIAGNOSIS — Z8546 Personal history of malignant neoplasm of prostate: Secondary | ICD-10-CM

## 2016-06-25 DIAGNOSIS — K219 Gastro-esophageal reflux disease without esophagitis: Secondary | ICD-10-CM

## 2016-06-25 DIAGNOSIS — D638 Anemia in other chronic diseases classified elsewhere: Secondary | ICD-10-CM

## 2016-06-25 DIAGNOSIS — I499 Cardiac arrhythmia, unspecified: Secondary | ICD-10-CM

## 2016-06-25 DIAGNOSIS — I509 Heart failure, unspecified: Secondary | ICD-10-CM

## 2016-06-25 DIAGNOSIS — J441 Chronic obstructive pulmonary disease with (acute) exacerbation: Secondary | ICD-10-CM

## 2016-06-25 DIAGNOSIS — M129 Arthropathy, unspecified: Secondary | ICD-10-CM

## 2016-06-25 DIAGNOSIS — I4891 Unspecified atrial fibrillation: Secondary | ICD-10-CM

## 2016-06-25 DIAGNOSIS — Z87891 Personal history of nicotine dependence: Secondary | ICD-10-CM

## 2016-06-25 DIAGNOSIS — E785 Hyperlipidemia, unspecified: Secondary | ICD-10-CM

## 2016-06-25 DIAGNOSIS — D471 Chronic myeloproliferative disease: Secondary | ICD-10-CM

## 2016-06-25 DIAGNOSIS — I10 Essential (primary) hypertension: Secondary | ICD-10-CM

## 2016-06-25 LAB — CULTURE, BLOOD (ROUTINE X 2)
Culture: NO GROWTH
Culture: NO GROWTH

## 2016-06-25 LAB — CBC
HCT: 25.2 % — ABNORMAL LOW (ref 40.0–52.0)
Hemoglobin: 8 g/dL — ABNORMAL LOW (ref 13.0–18.0)
MCH: 31.2 pg (ref 26.0–34.0)
MCHC: 31.8 g/dL — AB (ref 32.0–36.0)
MCV: 98.1 fL (ref 80.0–100.0)
PLATELETS: 379 10*3/uL (ref 150–440)
RBC: 2.57 MIL/uL — ABNORMAL LOW (ref 4.40–5.90)
RDW: 20.8 % — AB (ref 11.5–14.5)
WBC: 38.8 10*3/uL — AB (ref 3.8–10.6)

## 2016-06-25 LAB — PROTIME-INR
INR: 2.41
Prothrombin Time: 26.7 seconds — ABNORMAL HIGH (ref 11.4–15.2)

## 2016-06-25 LAB — FOLATE: Folate: 6.2 ng/mL (ref >5.9)

## 2016-06-25 LAB — IRON AND TIBC
Iron: 56 ug/dL (ref 45–182)
SATURATION RATIOS: 25 % (ref 17.9–39.5)
TIBC: 221 ug/dL — ABNORMAL LOW (ref 250–450)
UIBC: 165 ug/dL

## 2016-06-25 LAB — FERRITIN: Ferritin: 503 ng/mL — ABNORMAL HIGH (ref 24–336)

## 2016-06-25 LAB — VITAMIN B12: VITAMIN B 12: 455 pg/mL (ref 180–914)

## 2016-06-25 MED ORDER — WARFARIN SODIUM 2 MG PO TABS
2.0000 mg | ORAL_TABLET | Freq: Once | ORAL | Status: AC
  Administered 2016-06-25: 18:00:00 2 mg via ORAL
  Filled 2016-06-25: qty 1

## 2016-06-25 MED ORDER — FUROSEMIDE 10 MG/ML IJ SOLN
20.0000 mg | Freq: Two times a day (BID) | INTRAMUSCULAR | Status: DC
  Administered 2016-06-25 – 2016-06-26 (×3): 20 mg via INTRAVENOUS
  Filled 2016-06-25 (×3): qty 2

## 2016-06-25 NOTE — Progress Notes (Signed)
ANTICOAGULATION CONSULT NOTE - Follow Up Consult  Pharmacy Consult for Warfarin dosing Indication: atrial fibrillation  No Known Allergies  Patient Measurements: Height: 5\' 7"  (170.2 cm) Weight: 145 lb (65.8 kg) IBW/kg (Calculated) : 66.1  Vital Signs: Temp: 97.8 F (36.6 C) (09/29 0432) Temp Source: Oral (09/29 0432) BP: 90/51 (09/29 0432) Pulse Rate: 78 (09/29 0915)  Labs:  Recent Labs  06/23/16 0425 06/24/16 0514 06/25/16 0356 06/25/16 0357  HGB 9.2* 8.0*  --  8.0*  HCT 28.2* 24.9*  --  25.2*  PLT 285 330  --  379  LABPROT 18.5* 22.9* 26.7*  --   INR 1.52 1.99 2.41  --   CREATININE  --  0.98  --   --     Estimated Creatinine Clearance: 54.1 mL/min (by C-G formula based on SCr of 0.98 mg/dL).   Medications:  Prescriptions Prior to Admission  Medication Sig Dispense Refill Last Dose  . BREO ELLIPTA 100-25 MCG/INH AEPB Inhale 1 puff into the lungs every morning.   06/20/2016 at Unknown time  . digoxin (LANOXIN) 0.125 MG tablet Take 0.125 mg by mouth every morning.    06/20/2016 at Unknown time  . ferrous sulfate 325 (65 FE) MG tablet Take 325 mg by mouth every morning. Reported on 02/25/2016   06/20/2016  . furosemide (LASIX) 20 MG tablet Take 20 mg by mouth every other day. In the morning   06/20/2016 at Unknown time  . ibuprofen (ADVIL) 200 MG tablet Take 400 mg by mouth every morning.   06/20/2016  . methotrexate (RHEUMATREX) 2.5 MG tablet TAKE 6 TABLETS (15 MG TOTAL) BY MOUTH EVERY 7 (SEVEN) DAYS.  4 06/20/2016 at 0600  . montelukast (SINGULAIR) 10 MG tablet Take 1 tablet by mouth every morning.    06/20/2016 at Unknown time  . Multiple Vitamins-Minerals (PRESERVISION AREDS PO) Take 1 tablet by mouth every morning.    06/20/2016 at Unknown time  . warfarin (COUMADIN) 4 MG tablet Take 4 mg by mouth every morning.    06/20/2016 at 0600  . folic acid (FOLVITE) 1 MG tablet Take 1 mg by mouth daily.   11 Not Taking at Unknown time    Assessment: Patient admitted for  pneumonia with history of afib. Was taking Warfarin 2mg  alternating every other day with 4mg  prior to admission. INR on admission was subtherapeutic at 1.51. Noted that patient received warfarin 4mg  on 9/23 but missed dose of 2mg  on 9/24.  9/24 INR: 1.51, missed usual dose of warfarin 9/25 no INR, warfarin 3mg  9/26 INR: 1.57, warfarin 4mg  9/27 INR: 1.52, warfarin 4mg  9/28 INR: 1.99, warfarin 4 mg  Goal of Therapy:  INR 2-3 Monitor platelets by anticoagulation protocol: Yes   Plan:  INR of 2.41 is therapeutic today - fairly large increase from yesterday and anticipate that INR will increase again tomorrow.  Will give warfarin 2 mg PO tonight Check INR tomorrow AM  Lenis Noon, PharmD, BCPS 06/25/2016 1:24 PM

## 2016-06-25 NOTE — Progress Notes (Signed)
[place] NOTE  Patient Care Team: Allyne Gee, MD as PCP - General (Internal Medicine)  CHIEF COMPLAINTS/PURPOSE OF CONSULTATION: Leukocytosis HISTORY OF PRESENTING ILLNESS:  Thomas Townsend 82 y.o.  male  the history of leukocytosis- unclear etiology/possible myeloproliferative disorder- is currently admitted the hospital for shortness of breath.   Patient is currently on IV antibiotics/ possible severe bronchitis/COPD exacerbation. CT of the chest does not reveal any consolidation suggestive of pneumonia; suggestive of fluid overload/mild reactive mediastinal adenopathy. Patient's white count is noted to be around 50,000 prominent leukocytosis hemoglobin 8; platelets normal. Hematology has been consult for further evaluation.  Patient's white count is around 30,000 at baseline/predominant neutrophilia; previous bone marrow biopsy did not reveal any MDS or other acute abnormalities. He is currently on surveillance. Patient denies any night sweats or significant weight loss.  ROS: A complete 10 point review of system is done which is negative except mentioned above in history of present illness  MEDICAL HISTORY:  Past Medical History:  Diagnosis Date  . Arthritis   . Atrial fibrillation ([place])   . CHF (congestive heart failure) ([place])   . COPD (chronic obstructive pulmonary disease) ([place])   . Dysrhythmia   . GERD (gastroesophageal reflux disease)   . Hyperlipemia   . Hypertension   . Leukocytosis 01/14/2016  . Prostate cancer [facility])     SURGICAL HISTORY: Past Surgical History:  Procedure Laterality Date  . CHOLECYSTECTOMY    . COLONOSCOPY WITH PROPOFOL N/A 04/04/2015   Procedure: COLONOSCOPY WITH PROPOFOL;  Surgeon: Manya Silvas, MD;  Location: [facility] ENDOSCOPY;  Service: Endoscopy;  Laterality: N/A;  . ESOPHAGOGASTRODUODENOSCOPY N/A 04/04/2015   Procedure: ESOPHAGOGASTRODUODENOSCOPY (EGD);  Surgeon: Manya Silvas, MD;  Location: [facility] ENDOSCOPY;   Service: Endoscopy;  Laterality: N/A;    SOCIAL HISTORY: Social History   Social History  . Marital status: Widowed    Spouse name: N/A  . Number of children: N/A  . Years of education: N/A   Occupational History  . Not on file.   Social History Main Topics  . Smoking status: Former Smoker    Years: 20.00    Types: Cigarettes  . Smokeless tobacco: Never Used  . Alcohol use 0.0 oz/week     Comment: Wine every once in a while  . Drug use: No  . Sexual activity: No   Other Topics Concern  . Not on file   Social History Narrative  . No narrative on file    FAMILY HISTORY: Family History  Problem Relation Age of Onset  . Hypertension Other     ALLERGIES:  has No Known Allergies.  MEDICATIONS:  Current Facility-Administered Medications  Medication Dose Route Frequency Provider Last Rate Last Dose  . acetaminophen (TYLENOL) tablet 650 mg  650 mg Oral Q6H PRN Lance Coon, MD       Or  . acetaminophen (TYLENOL) suppository 650 mg  650 mg Rectal Q6H PRN Lance Coon, MD      . alum & mag hydroxide-simeth (MAALOX/MYLANTA) 200-200-20 MG/5ML suspension 30 mL  30 mL Oral TID PRN Alexis Hugelmeyer, DO   30 mL at 06/24/16 2111  . azithromycin (ZITHROMAX) tablet 500 mg  500 mg Oral Daily Vaughan Basta, MD   500 mg at 06/24/16 2023  . bisacodyl (DULCOLAX) EC tablet 5 mg  5 mg Oral Daily PRN Demetrios Loll, MD      . budesonide (PULMICORT) nebulizer solution 0.5 mg  0.5 mg Nebulization BID Henreitta Leber, MD  0.5 mg at 06/25/16 0741  . cefTRIAXone (ROCEPHIN) 1 g in dextrose 5 % 50 mL IVPB  1 g Intravenous Q24H Vaughan Basta, MD   1 g at 06/24/16 1847  . digoxin (LANOXIN) tablet 0.125 mg  0.125 mg Oral Daily Lance Coon, MD   0.125 mg at 06/24/16 0840  . docusate sodium (COLACE) capsule 100 mg  100 mg Oral BID Demetrios Loll, MD   100 mg at 06/24/16 2023  . furosemide (LASIX) tablet 20 mg  20 mg Oral Daily Lance Coon, MD   20 mg at 06/24/16 0840  . Influenza vac split  quadrivalent PF (FLUARIX) injection 0.5 mL  0.5 mL Intramuscular Tomorrow-1000 Lance Coon, MD      . ipratropium-albuterol (DUONEB) 0.5-2.5 (3) MG/3ML nebulizer solution 3 mL  3 mL Nebulization TID Hillary Bow, MD   3 mL at 06/25/16 0741  . montelukast (SINGULAIR) tablet 10 mg  10 mg Oral Daily Lance Coon, MD   10 mg at 06/24/16 0840  . ondansetron (ZOFRAN) tablet 4 mg  4 mg Oral Q6H PRN Lance Coon, MD       Or  . ondansetron [facility]) injection 4 mg  4 mg Intravenous Q6H PRN Lance Coon, MD      . predniSONE (DELTASONE) tablet 60 mg  60 mg Oral Q breakfast Vaughan Basta, MD       Followed by  . [START ON 06/26/2016] predniSONE (DELTASONE) tablet 50 mg  50 mg Oral Q breakfast Vaughan Basta, MD       Followed by  . [START ON 06/27/2016] predniSONE (DELTASONE) tablet 40 mg  40 mg Oral Q breakfast Vaughan Basta, MD       Followed by  . [START ON 06/28/2016] predniSONE (DELTASONE) tablet 30 mg  30 mg Oral Q breakfast Vaughan Basta, MD       Followed by  . [START ON 06/29/2016] predniSONE (DELTASONE) tablet 20 mg  20 mg Oral Q breakfast Vaughan Basta, MD       Followed by  . [START ON 06/30/2016] predniSONE (DELTASONE) tablet 10 mg  10 mg Oral Q breakfast Vaughan Basta, MD      . sodium chloride flush (NS) 0.9 % injection 3 mL  3 mL Intravenous Q12H Lance Coon, MD   3 mL at 06/24/16 2023  . warfarin (COUMADIN) tablet 4 mg  4 mg Oral q1800 Vira Blanco, RPH   4 mg at 06/24/16 1740  . Warfarin - Pharmacist Dosing Inpatient   Does not apply q1800 Vira Blanco, [facility]          .  PHYSICAL EXAMINATION:  Vitals:   06/24/16 2050 06/25/16 0432  BP: (!) 124/52 (!) 90/51  Pulse: (!) 111 78  Resp: 20 20  Temp: 98 F (36.7 C) 97.8 F (36.6 C)   Filed Weights   06/20/16 1940 06/21/16 0212  Weight: 140 lb (63.5 kg) 145 lb (65.8 kg)    GENERAL: Well-nourished well-developed; Alert, no distress and comfortable.   Alone. It is unclear  digoxin.EYES: no pallor or icterus OROPHARYNX: no thrush or ulceration. NECK: supple, no masses felt LYMPH:  no palpable lymphadenopathy in the cervical, axillary or inguinal regions LUNGS: decreased breath sounds to auscultation at bases and  No wheeze or crackles HEART/CVS: regular rate & rhythm and no murmurs; No lower extremity edema ABDOMEN: abdomen soft, non-tender and normal bowel sounds Musculoskeletal:no cyanosis of digits and no clubbing  PSYCH: alert & oriented x 3 with fluent speech NEURO: no  focal motor/sensory deficits SKIN:  no rashes or significant lesions  LABORATORY DATA:  I have reviewed the data as listed Lab Results  Component Value Date   WBC 52.3 (HH) 06/24/2016   HGB 8.0 (L) 06/24/2016   HCT 24.9 (L) 06/24/2016   MCV 98.7 06/24/2016   PLT 330 06/24/2016    Recent Labs  02/25/16 1005 02/26/16 1003  06/20/16 1949 06/21/16 0426 06/24/16 0514  NA 136 137  < > 135 138 138  K 3.6 3.6  < > 3.9 3.7 3.8  CL 106 105  < > 106 112* 105  CO2 23 24  < > '25 23 23  ' GLUCOSE 124* 119*  < > 113* 100* 146*  BUN 21* 22*  < > 19 15 27*  CREATININE 1.10 1.14  < > 1.12 0.92 0.98  CALCIUM 8.4* 8.3*  < > 8.5* 7.5* 8.6*  GFRNONAA >60 58*  < > 59* >60 >60  GFRAA >60 >60  < > >60 >60 >60  PROT 6.9 6.6  --  6.7  --   --   ALBUMIN 3.7 3.4*  --  3.4*  --   --   AST 19 26  --  23  --   --   ALT 12* 14*  --  15*  --   --   ALKPHOS 107 88  --  82  --   --   BILITOT 0.6 0.5  --  0.8  --   --   < > = values in this interval not displayed.  RADIOGRAPHIC STUDIES: I have personally reviewed the radiological images as listed and agreed with the findings in the report. Dg Chest 1 View  Result Date: 06/22/2016 CLINICAL DATA:  Shortness of breath, acute onset. Current history of atrial fibrillation. Initial encounter. EXAM: CHEST 1 VIEW COMPARISON:  Chest radiograph performed 06/20/2016 FINDINGS: The lungs are well-aerated. Patchy bilateral midlung and left basilar airspace opacities  raise concern for pneumonia. Mild interstitial edema might have a similar appearance. No pleural effusion or pneumothorax is seen. The cardiomediastinal silhouette is within normal limits. No acute osseous abnormalities are seen. IMPRESSION: Patchy bilateral midlung and left basilar airspace opacities raise concern for pneumonia. Mild interstitial edema might have a similar appearance. Electronically Signed   By: Garald Balding M.D.   On: 06/22/2016 18:53   Dg Chest 2 View  Result Date: 06/24/2016 CLINICAL DATA:  Hospitalized for pneumonia 1 week ago. Dry cough. Oxygen requirement. EXAM: CHEST  2 VIEW COMPARISON:  06/22/2016 FINDINGS: There is increased right paratracheal and left paratracheal density, suspicious for superior mediastinal mass or thyroid enlargement. Heart is upper limits normal and accentuated by the AP position of patient. There are mildly prominent interstitial markings there has been some improvement in aeration of the lung bases. No new consolidations or pleural effusions are identified. IMPRESSION: 1. Suspect mild interstitial edema. 2. Question of thyroid enlargement versus mediastinal mass. Recommend further evaluation. Initial evaluation with PA and lateral chest x-ray is recommended. If density persists, CT of the chest is recommended. Intravenous contrast is recommended unless it is contraindicated. These results will be called to the ordering clinician or representative by the Radiologist Assistant, and communication documented in the PACS or zVision Dashboard. Electronically Signed   By: Nolon Nations M.D.   On: 06/24/2016 11:00   Ct Head Wo Contrast  Result Date: 06/20/2016 CLINICAL DATA:  Altered mental status/confusion EXAM: CT HEAD WITHOUT CONTRAST TECHNIQUE: Contiguous axial images were obtained from the base of  the skull through the vertex without intravenous contrast. COMPARISON:  None. FINDINGS: Brain: There is moderate diffuse atrophy. There is no intracranial mass,  hemorrhage, extra-axial fluid collection, or midline shift. There is mild patchy small vessel disease in the centra semiovale bilaterally. Elsewhere gray-white compartments appear normal. No acute infarct evident. Vascular: No hyperdense vessel. There is calcification in both carotid siphon regions. There is also calcification in the distal left vertebral artery. Skull: The bony calvarium appears intact. Sinuses/Orbits: There is mucosal thickening in several ethmoid air cells bilaterally. Visualized paranasal sinuses elsewhere clear. There is leftward deviation of the anterior nasal septum. Visualized orbits appear symmetric bilaterally. Other: There is mild thickening in several mastoid air cells inferiorly on the left. Mastoid air cells elsewhere clear. IMPRESSION: Moderate atrophy with mild periventricular small vessel disease. No intracranial mass, hemorrhage, or extra-axial fluid collection. No acute infarct evident. There are foci of arterial vascular calcification. There is mucosal thickening in several ethmoid air cells. There is mild inferior left mastoid disease. There is leftward deviation of the anterior nasal septum. Electronically Signed   By: Lowella Grip III M.D.   On: 06/20/2016 21:10   Ct Chest W Contrast  Result Date: 06/24/2016 CLINICAL DATA:  Followup abnormal chest radiograph EXAM: CT CHEST WITH CONTRAST TECHNIQUE: Multidetector CT imaging of the chest was performed during intravenous contrast administration. CONTRAST:  48m ISOVUE-300 IOPAMIDOL (ISOVUE-300) INJECTION 61% COMPARISON:  None FINDINGS: Cardiovascular: Moderate cardiac enlargement. Aortic atherosclerosis noted. Calcifications in the LAD, RCA and left circumflex coronary artery noted. Mediastinum/Nodes: The trachea appears patent and is midline. Normal appearance of the esophagus. Prominent mediastinal lymph nodes are identified. Index right paratracheal lymph node measures 1 cm, image 62 of series 2. Lungs/Pleura: Small  pleural effusions are noted right greater than left. Moderate to advanced changes of centrilobular and paraseptal emphysema identified. Diffuse bronchial wall thickening There is mild interlobular septal thickening identified in both lungs which appear basilar predominant especially within the right lung. Perifissural nodule in the right middle lobe measures scratch sec calcified granuloma is identified in the right Upper Abdomen: Previous coli cystectomy. Pneumobilia identified compatible with biliary patency. No acute findings identified within the upper abdomen. Musculoskeletal: No aggressive lytic or sclerotic bone lesions. No significant bone abnormality noted. IMPRESSION: 1. Mild cardiac enlargement, multi vessel coronary artery calcifications, pleural effusions and interstitial edema suspicious for CHF. 2. Diffuse bronchial wall thickening with emphysema, as above; imaging findings suggestive of underlying COPD. 3. Borderline enlarged right paratracheal lymph nodes identified. Nonspecific in the setting of CHF. Electronically Signed   By: TKerby MoorsM.D.   On: 06/24/2016 20:20   Dg Chest Port 1 View  Result Date: 06/20/2016 CLINICAL DATA:  Acute onset of confusion.  Initial encounter. EXAM: PORTABLE CHEST 1 VIEW COMPARISON:  Chest radiograph performed 08/25/2015 FINDINGS: The lungs are well-aerated. Mild bibasilar airspace opacities are new from the prior study, and may reflect pneumonia or mild interstitial edema. There is no evidence of pleural effusion or pneumothorax. The cardiomediastinal silhouette is borderline enlarged. No acute osseous abnormalities are seen. IMPRESSION: Mild bibasilar airspace opacities are new from the prior study, and may reflect pneumonia or mild interstitial edema. Borderline cardiomegaly. Electronically Signed   By: JGarald BaldingM.D.   On: 06/20/2016 20:05    ASSESSMENT & PLAN:   # 82year old male patient history of chronic leukocytosis/neutrophilia currently  admitted to the hospital for worsening shortness of breath.  # Chronic leukocytosis- based and 30,000 currently 50,000; predominant neutrophilia. Suspect reactive. Recommend  checking peripheral blood flow cytometry.   # Chronic mild anemia hemoglobin 8. Rule out iron deficiency Z16 folic acid. Question underlying primary bone marrow process. Discussed with the patient that he might require another bone marrow biopsy given his worsening anemia. However this could be offered as an outpatient when his acute respiratory status improves. He agrees with the plan.  # COPD exacerbation/CHF- defer to hospitalist service.   Thank you Dr. Boyce Medici for allowing me to participate in the care of your pleasant patient. Please do not hesitate to contact me with questions or concerns in the interim.     Cammie Sickle, MD 06/25/2016 7:51 AM

## 2016-06-25 NOTE — Progress Notes (Signed)
Physical Therapy Treatment Patient Details Name: Thomas Townsend MRN: [id number] DOB: [birth year] Today's Date: 06/25/2016    History of Present Illness Pt admitted for sepsis. Pt with complaints of AMS and pneumonia. Pt with history of arthritis, Afib, COPD, GERD, and prostate cancer.     PT Comments    Pt presented in bed agreeable to ambulation, requesting to urinate prior.  Pt mod I with HOB elevated supine to sit, mod I sit to stand. Pt demonstrating F+ balance with no AD while voiding.  Pt currently on 2L supplemental O2.  Prior to ambulation SpO2 92% increasing to 95% with cues for diaphragmatic breathing. Pt ambulated 361f x1 on 2L, maintaining SpO2 upper 80s to low 90s until desat at end to 79%.  Increased supplemental O2 to 3L with recovery to 90% within 20 seconds. Pt encouraged to pace self with activities as pt indicated no feeling of SOB of increased exertion during ambulation.  Pt wished to return to bed and with alarm on and all current needs met.    Follow Up Recommendations  Home health PT     Equipment Recommendations  None recommended by PT    Recommendations for Other Services       Precautions / Restrictions Precautions Precautions: Fall Restrictions Weight Bearing Restrictions: No    Mobility  Bed Mobility Overal bed mobility: Modified Independent             General bed mobility comments: HOB raised   Transfers Overall transfer level: Modified independent Equipment used: Rolling walker (2 wheeled)             General transfer comment: demonstrated safe technique  Ambulation/Gait Ambulation/Gait assistance: Supervision Ambulation Distance (Feet): 350 Feet Assistive device: Rolling walker (2 wheeled) Gait Pattern/deviations: Step-through pattern     General Gait Details: 2L O2, did not demonstrate exertion however desat to 74% at end of session   Stairs            Wheelchair Mobility    Modified Rankin (Stroke Patients  Only)       Balance Overall balance assessment: Needs assistance Sitting-balance support: Feet supported Sitting balance-Leahy Scale: Normal     Standing balance support: Bilateral upper extremity supported Standing balance-Leahy Scale: Good                      Cognition Arousal/Alertness: Awake/alert Behavior During Therapy: WFL for tasks assessed/performed Overall Cognitive Status: Within Functional Limits for tasks assessed                      Exercises      General Comments        Pertinent Vitals/Pain Pain Assessment: No/denies pain    Home Living                      Prior Function            PT Goals (current goals can now be found in the care plan section) Acute Rehab PT Goals Patient 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

## 2016-06-25 NOTE — Care Management Important Message (Signed)
Important Message  Patient Details  Name: Thomas Townsend MRN: [id number] Date of Birth: 04/25/1931   Medicare Important Message Given:  Yes    Shelbie Ammons, RN 06/25/2016, 8:16 AM

## 2016-06-25 NOTE — Progress Notes (Signed)
[facility] NAME: Thomas Townsend    MR#:  [id number]  DATE OF BIRTH:  1931/01/07  SUBJECTIVE:  CHIEF COMPLAINT:   Chief Complaint  Patient presents with  . Altered Mental Status   Feels better with breathing. Dry cough. No chest pain Afebrile. Not on home oxygen. More oxygen need 4 ltr. No oxygen required before this admission.  CT chest showed pulmonary edema., started on IV lasix 06/25/16.  REVIEW OF SYSTEMS:  Review of Systems  Constitutional: Positive for malaise/fatigue. Negative for chills and fever.  HENT: Negative for sore throat.   Eyes: Negative for blurred vision and double vision.  Respiratory: Positive for cough and shortness of breath. Negative for wheezing and stridor.   Cardiovascular: Negative for chest pain, palpitations and leg swelling.  Gastrointestinal: Negative for blood in stool, diarrhea, melena, nausea and vomiting.  Genitourinary: Negative for dysuria and urgency.  Musculoskeletal: Negative for joint pain.  Skin: Negative for itching and rash.  Neurological: Negative for dizziness, seizures, loss of consciousness and headaches.  Psychiatric/Behavioral: Negative for depression.    DRUG ALLERGIES:  No Known Allergies VITALS:  Blood pressure (!) 94/41, pulse 79, temperature 98.3 F (36.8 C), temperature source Oral, resp. rate (!) 21, height 5\' 7"  (1.702 m), weight 65.8 kg (145 lb), SpO2 94 %. PHYSICAL EXAMINATION:  Physical Exam  Constitutional: He is oriented to person, place, and time and well-developed, well-nourished, and in no distress. No distress.  HENT:  Head: Normocephalic.  Mouth/Throat: Oropharynx is clear and moist.  Eyes: Conjunctivae and EOM are normal. No scleral icterus.  Neck: Normal range of motion. Neck supple. No JVD present. No thyromegaly present.  Cardiovascular: Normal heart sounds.  Exam reveals no gallop.   No murmur heard. Irregularly irregular with tachycardia    Pulmonary/Chest: Effort normal and breath sounds normal. No respiratory distress. He has no wheezes. He has no rales.  Decreased air entry bilaterally  Abdominal: He exhibits no distension. There is no tenderness.  Musculoskeletal: Normal range of motion. He exhibits no edema or tenderness.  Lymphadenopathy:    He has no cervical adenopathy.  Neurological: He is alert and oriented to person, place, and time. No cranial nerve deficit.  Skin: Skin is warm.  Psychiatric: Affect normal.   LABORATORY PANEL:   CBC  Recent Labs Lab 06/25/16 0357  WBC 38.8*  HGB 8.0*  HCT 25.2*  PLT 379   ------------------------------------------------------------------------------------------------------------------ Chemistries   Recent Labs Lab 06/20/16 1949  06/22/16 0418 06/24/16 0514  NA 135  < >  --  138  K 3.9  < >  --  3.8  CL 106  < >  --  105  CO2 25  < >  --  23  GLUCOSE 113*  < >  --  146*  BUN 19  < >  --  27*  CREATININE 1.12  < >  --  0.98  CALCIUM 8.5*  < >  --  8.6*  MG  --   --  1.8  --   AST 23  --   --   --   ALT 15*  --   --   --   ALKPHOS 82  --   --   --   BILITOT 0.8  --   --   --   < > = values in this interval not displayed. RADIOLOGY:  Ct Chest W Contrast  Result Date: 06/24/2016 CLINICAL DATA:  Followup abnormal  chest radiograph EXAM: CT CHEST WITH CONTRAST TECHNIQUE: Multidetector CT imaging of the chest was performed during intravenous contrast administration. CONTRAST:  75mL ISOVUE-300 IOPAMIDOL (ISOVUE-300) INJECTION 61% COMPARISON:  None FINDINGS: Cardiovascular: Moderate cardiac enlargement. Aortic atherosclerosis noted. Calcifications in the LAD, RCA and left circumflex coronary artery noted. Mediastinum/Nodes: The trachea appears patent and is midline. Normal appearance of the esophagus. Prominent mediastinal lymph nodes are identified. Index right paratracheal lymph node measures 1 cm, image 62 of series 2. Lungs/Pleura: Small pleural effusions are noted  right greater than left. Moderate to advanced changes of centrilobular and paraseptal emphysema identified. Diffuse bronchial wall thickening There is mild interlobular septal thickening identified in both lungs which appear basilar predominant especially within the right lung. Perifissural nodule in the right middle lobe measures scratch sec calcified granuloma is identified in the right Upper Abdomen: Previous coli cystectomy. Pneumobilia identified compatible with biliary patency. No acute findings identified within the upper abdomen. Musculoskeletal: No aggressive lytic or sclerotic bone lesions. No significant bone abnormality noted. IMPRESSION: 1. Mild cardiac enlargement, multi vessel coronary artery calcifications, pleural effusions and interstitial edema suspicious for CHF. 2. Diffuse bronchial wall thickening with emphysema, as above; imaging findings suggestive of underlying COPD. 3. Borderline enlarged right paratracheal lymph nodes identified. Nonspecific in the setting of CHF. Electronically Signed   By: Kerby Moors M.D.   On: 06/24/2016 20:20   ASSESSMENT AND PLAN:   * Bilateral pneumonia with acute hypoxic respiratory failure and sepsis   CT chest does not show pneumonia, possibly he had acute bronchitis on admission. On IV antibiotics. Cultures negative. 06/25/16 is Day 6 of Abx- stop tomorrow. Wean oxygen as tolerated. Patient is on 4 L oxygen . Needs continued inpatient treatment. Repeat Xray chest showed possible thyroid mass   No mass noted on CT chest with contrast.   But it showed Pulmonary edema.  * ac on ch diastolic CHF   IV lasix    Monitor and try to taper oxygen.  * A-fib with RVR On PO digoxin  now Controlled.  * Anemia of chronic disease. Hemoglobin decreased from 8.5 to 7.3. Repeat 7.5. No active bleeding. Follow-up stool occult and hemoglobin.  * Chronic obstructive pulmonary disease exacerbation Reduce steroids- switch to oral. Nebs PRN breo  *  Leukocytosis Patient has baseline high WBC count. Presently worse due to pneumonia. Pt had out pt aout pt appointment with cancer center- but now as WBCs are very high- called inpatient consult.  * DVT prophylaxis with Lovenox  All the records are reviewed and case discussed with Care Management/Social Worker.  CODE STATUS: Partial  TOTAL TIME TAKING CARE OF THIS PATIENT: 30 minutes.   POSSIBLE D/C IN 1-2 DAYS, DEPENDING ON CLINICAL CONDITION.  Vaughan Basta M.D on 06/25/2016 at 2:57 PM  Between 7am to 6pm - Pager - [phone]  After 6pm go to [email or web address] - Proofreader  [facility]  Office  [phone]  CC: Primary care physician; Allyne Gee, MD  Note: This dictation was prepared with Dragon dictation along with smaller phrase technology. Any transcriptional errors that result from this process are unintentional.

## 2016-06-26 DIAGNOSIS — Z23 Encounter for immunization: Secondary | ICD-10-CM | POA: Diagnosis not present

## 2016-06-26 LAB — PROTIME-INR
INR: 2.2
PROTHROMBIN TIME: 24.8 s — AB (ref 11.4–15.2)

## 2016-06-26 MED ORDER — PREDNISONE 10 MG (21) PO TBPK: ORAL_TABLET | ORAL | 0 refills | Status: DC

## 2016-06-26 MED ORDER — WARFARIN SODIUM 2 MG PO TABS: 3.0000 mg | ORAL_TABLET | Freq: Every day | ORAL | Status: DC

## 2016-06-26 NOTE — Care Management Note (Signed)
Case Management Note  Patient Details  Name: Thomas Townsend MRN: [id number] Date of Birth: 08-24-1931  Subjective/Objective:         Mr Jaquith refused Home Health Services and Dr Lavetta Nielsen was updated. New oxygen was ordered from [place] and a portable tank will be delivered to Mr [facility] hospital room later today.            Action/Plan:   Expected Discharge Date:                  Expected Discharge Plan:     In-House Referral:     Discharge planning Services     Post Acute Care Choice:    Choice offered to:     DME Arranged:    DME Agency:     HH Arranged:    HH Agency:     Status of Service:     If discussed at [employer] of Stay Meetings, dates discussed:    Additional Comments:  Charmion Hapke A, RN 06/26/2016, 9:39 AM

## 2016-06-26 NOTE — Progress Notes (Signed)
While rounding, [facility] made a follow up visit with Pt. Pt was in final stages of checkout. Pt was appreciative of my visit and asked for "another good prayer before he left". I provided a prayer of celebration of good care and the return to home.    06/26/16 1300  Clinical Encounter Type  Visited With Patient;Patient and family together  Visit Type Follow-up;Spiritual support  Referral From Nurse  Spiritual Encounters  Spiritual Needs Prayer

## 2016-06-26 NOTE — Progress Notes (Signed)
ANTICOAGULATION CONSULT NOTE - Follow Up Consult  Pharmacy Consult for Warfarin dosing Indication: atrial fibrillation   Patient admitted for pneumonia with history of afib. Was taking Warfarin 2mg  alternating every other day with 4mg  prior to admission. INR on admission was subtherapeutic at 1.51. Noted that patient received warfarin 4mg  on 9/23 but missed dose of 2mg  on 9/24. Goal INR 2-3.    Plan:  Will continue patient on warfarin 3mg  daily. Will recheck INR with am labs.     No Known Allergies  Patient Measurements: Height: 5\' 7"  (170.2 cm) Weight: 145 lb (65.8 kg) IBW/kg (Calculated) : 66.1  Vital Signs: BP: 101/59 (09/30 0431) Pulse Rate: 59 (09/30 0431)  Labs:  Recent Labs  06/24/16 0514 06/25/16 0356 06/25/16 0357 06/26/16 0410  HGB 8.0*  --  8.0*  --   HCT 24.9*  --  25.2*  --   PLT 330  --  379  --   LABPROT 22.9* 26.7*  --  24.8*  INR 1.99 2.41  --  2.20  CREATININE 0.98  --   --   --     Estimated Creatinine Clearance: 54.1 mL/min (by C-G formula based on SCr of 0.98 mg/dL).   Medications:  Prescriptions Prior to Admission  Medication Sig Dispense Refill Last Dose  . BREO ELLIPTA 100-25 MCG/INH AEPB Inhale 1 puff into the lungs every morning.   06/20/2016 at Unknown time  . digoxin (LANOXIN) 0.125 MG tablet Take 0.125 mg by mouth every morning.    06/20/2016 at Unknown time  . ferrous sulfate 325 (65 FE) MG tablet Take 325 mg by mouth every morning. Reported on 02/25/2016   06/20/2016  . furosemide (LASIX) 20 MG tablet Take 20 mg by mouth every other day. In the morning   06/20/2016 at Unknown time  . ibuprofen (ADVIL) 200 MG tablet Take 400 mg by mouth every morning.   06/20/2016  . methotrexate (RHEUMATREX) 2.5 MG tablet TAKE 6 TABLETS (15 MG TOTAL) BY MOUTH EVERY 7 (SEVEN) DAYS.  4 06/20/2016 at 0600  . montelukast (SINGULAIR) 10 MG tablet Take 1 tablet by mouth every morning.    06/20/2016 at Unknown time  . Multiple Vitamins-Minerals (PRESERVISION AREDS  PO) Take 1 tablet by mouth every morning.    06/20/2016 at Unknown time  . warfarin (COUMADIN) 4 MG tablet Take 4 mg by mouth every morning.    06/20/2016 at 0600  . folic acid (FOLVITE) 1 MG tablet Take 1 mg by mouth daily.   11 Not Taking at Unknown time    Assessment: Patient admitted for pneumonia with history of afib. Was taking Warfarin 2mg  alternating every other day with 4mg  prior to admission. INR on admission was subtherapeutic at 1.51. Noted that patient received warfarin 4mg  on 9/23 but missed dose of 2mg  on 9/24.  9/24 INR: 1.51, missed usual dose of warfarin 9/25 no INR, warfarin 3mg  9/26 INR: 1.57, warfarin 4mg  9/27 INR: 1.52, warfarin 4mg  9/28 INR: 1.99, warfarin 4 mg  Goal of Therapy:  INR 2-3 Monitor platelets by anticoagulation protocol: Yes   Plan:  INR of 2.41 is therapeutic today - fairly large increase from yesterday and anticipate that INR will increase again tomorrow.  Will give warfarin 2 mg PO tonight Check INR tomorrow AM  Simpson,Michael L, PharmD, BCPS 06/26/2016 11:59 AM

## 2016-06-26 NOTE — Progress Notes (Signed)
[facility] delivered pt's portable oxygen tank for discharge; pt discharged via wheelchair by nursing to the visitor's entrance accompanied by his daughter

## 2016-06-26 NOTE — Progress Notes (Signed)
SATURATION QUALIFICATIONS: (This note is used to comply with regulatory documentation for home oxygen)  Patient Saturations on Room Air at Rest = 87%  Patient Saturations on Room Air while Ambulating = 86%  Patient Saturations on 2 Liters of oxygen while Ambulating = 90%  Please briefly explain why patient needs home oxygen: 

## 2016-06-26 NOTE — Discharge Summary (Signed)
[facility] [place] NAME: Thomas Townsend    MR#:  [id number]  DATE OF BIRTH:  [birth year]  DATE OF ADMISSION:  06/20/2016 ADMITTING PHYSICIAN: Lance Coon, MD  DATE OF DISCHARGE: 06/26/16  PRIMARY CARE PHYSICIAN: Allyne Gee, MD    ADMISSION DIAGNOSIS:  Community acquired pneumonia [J18.9] Acute respiratory failure with hypoxia ([place]) [J96.01] Sepsis, due to unspecified organism ([place]) [A41.9]  DISCHARGE DIAGNOSIS:  Principal Problem:   Sepsis ([place]) Acute respiratory failure with hypoxia ([place]) [J96.01] CAP (community acquired pneumonia) Active Problems:   A-fib ([place])   Acid reflux   Chronic obstructive pulmonary disease ([place])   Myeloproliferative neoplasm ([place])   Anemia due to other cause   SECONDARY DIAGNOSIS:   Past Medical History:  Diagnosis Date  . Arthritis   . Atrial fibrillation ([place])   . CHF (congestive heart failure) ([place])   . COPD (chronic obstructive pulmonary disease) ([place])   . Dysrhythmia   . GERD (gastroesophageal reflux disease)   . Hyperlipemia   . Hypertension   . Leukocytosis 01/14/2016  . Prostate cancer [facility])     HOSPITAL COURSE:  Thomas Townsend  is a 82 y.o. male admitted 06/20/2016 with chief complaint Altered Mental Status and shortness of breath. Please see H&P performed by Lance Coon, MD for further information. Patient presented to the hospital with the above symptoms, meeting sepsis criteria on admission secondary to community acquired pneumonia. He was started on azithromycin and ceftriaxone, completed full course. During hospitalization, he was noted to have pulmonary edema and received lasix for diuresis with improvement. Despite much improvement he still  requires oxygen  DISCHARGE CONDITIONS:   stable  CONSULTS OBTAINED:  Treatment Team:  Cammie Sickle, MD  DRUG ALLERGIES:  No Known Allergies  DISCHARGE MEDICATIONS:   Current Discharge Medication List    START taking  these medications   Details  predniSONE (STERAPRED UNI-PAK 21 TAB) 10 MG (21) TBPK tablet 40mg  x1 day, 20mg  x2 day, 10mg  x2 day then stop Qty: 10 tablet, Refills: 0      CONTINUE these medications which have NOT CHANGED   Details  BREO ELLIPTA 100-25 MCG/INH AEPB Inhale 1 puff into the lungs every morning.    digoxin (LANOXIN) 0.125 MG tablet Take 0.125 mg by mouth every morning.     ferrous sulfate 325 (65 FE) MG tablet Take 325 mg by mouth every morning. Reported on 02/25/2016    furosemide (LASIX) 20 MG tablet Take 20 mg by mouth every other day. In the morning    ibuprofen (ADVIL) 200 MG tablet Take 400 mg by mouth every morning.    methotrexate (RHEUMATREX) 2.5 MG tablet TAKE 6 TABLETS (15 MG TOTAL) BY MOUTH EVERY 7 (SEVEN) DAYS. Refills: 4    montelukast (SINGULAIR) 10 MG tablet Take 1 tablet by mouth every morning.     Multiple Vitamins-Minerals (PRESERVISION AREDS PO) Take 1 tablet by mouth every morning.     warfarin (COUMADIN) 4 MG tablet Take 4 mg by mouth every morning.     folic acid (FOLVITE) 1 MG tablet Take 1 mg by mouth daily.  Refills: 11         DISCHARGE INSTRUCTIONS:    DIET:  Cardiac diet  DISCHARGE CONDITION:  Stable  ACTIVITY:  Activity as tolerated  OXYGEN:  Home Oxygen: Yes.     Oxygen Delivery: 2 liters/min via Patient connected to nasal cannula oxygen  DISCHARGE LOCATION:  home   If you experience worsening  of your admission symptoms, develop shortness of breath, life threatening emergency, suicidal or homicidal thoughts you must seek medical attention immediately by calling 911 or calling your MD immediately  if symptoms less severe.  You Must read complete instructions/literature along with all the possible adverse reactions/side effects for all the Medicines you take and that have been prescribed to you. Take any new Medicines after you have completely understood and accpet all the possible adverse reactions/side effects.    Please note  You were cared for by a hospitalist during your hospital stay. If you have any questions about your discharge medications or the care you received while you were in the hospital after you are discharged, you can call the unit and asked to speak with the hospitalist on call if the hospitalist that took care of you is not available. Once you are discharged, your primary care physician will handle any further medical issues. Please note that NO REFILLS for any discharge medications will be authorized once you are discharged, as it is imperative that you return to your primary care physician (or establish a relationship with a primary care physician if you do not have one) for your aftercare needs so that they can reassess your need for medications and monitor your lab values.    On the day of Discharge:   VITAL SIGNS:  Blood pressure (!) 101/59, pulse (!) 59, temperature 97.4 F (36.3 C), temperature source Oral, resp. rate 17, height 5\' 7"  (1.702 m), weight 65.8 kg (145 lb), SpO2 91 %.  I/O:   Intake/Output Summary (Last 24 hours) at 06/26/16 0827 Last data filed at 06/26/16 0818  Gross per 24 hour  Intake              360 ml  Output             2525 ml  Net            -2165 ml    PHYSICAL EXAMINATION:  GENERAL:  82 y.o.-year-old patient lying in the bed with no acute distress.  EYES: Pupils equal, round, reactive to light and accommodation. No scleral icterus. Extraocular muscles intact.  HEENT: Head atraumatic, normocephalic. Oropharynx and nasopharynx clear.  NECK:  Supple, no jugular venous distention. No thyroid enlargement, no tenderness.  LUNGS: Normal breath sounds bilaterally, no wheezing, rales,rhonchi or crepitation. No use of accessory muscles of respiration.  CARDIOVASCULAR: S1, S2 normal. No murmurs, rubs, or gallops.  ABDOMEN: Soft, non-tender, non-distended. Bowel sounds present. No organomegaly or mass.  EXTREMITIES: No pedal edema, cyanosis, or  clubbing.  NEUROLOGIC: Cranial nerves II through XII are intact. Muscle strength 5/5 in all extremities. Sensation intact. Gait not checked.  PSYCHIATRIC: The patient is alert and oriented x 3.  SKIN: No obvious rash, lesion, or ulcer.   DATA REVIEW:   CBC  Recent Labs Lab 06/25/16 0357  WBC 38.8*  HGB 8.0*  HCT 25.2*  PLT 379    Chemistries   Recent Labs Lab 06/20/16 1949  06/22/16 0418 06/24/16 0514  NA 135  < >  --  138  K 3.9  < >  --  3.8  CL 106  < >  --  105  CO2 25  < >  --  23  GLUCOSE 113*  < >  --  146*  BUN 19  < >  --  27*  CREATININE 1.12  < >  --  0.98  CALCIUM 8.5*  < >  --  8.6*  MG  --   --  1.8  --   AST 23  --   --   --   ALT 15*  --   --   --   ALKPHOS 82  --   --   --   BILITOT 0.8  --   --   --   < > = values in this interval not displayed.  Cardiac Enzymes  Recent Labs Lab 06/20/16 1949  [place] <0.03    Microbiology Results  Results for orders placed or performed during the hospital encounter of 06/20/16  Blood Culture (routine x 2)     Status: None   Collection Time: 06/20/16  7:49 PM  Result Value Ref Range Status   Specimen Description BLOOD RIGHT WRIST  Final   Special Requests BOTTLES DRAWN AEROBIC AND ANAEROBIC 5CC  Final   Culture NO GROWTH 5 DAYS  Final   Report Status 06/25/2016 FINAL  Final  Blood Culture (routine x 2)     Status: None   Collection Time: 06/20/16  8:20 PM  Result Value Ref Range Status   Specimen Description BLOOD RIGHT ANTECUBITAL  Final   Special Requests BOTTLES DRAWN AEROBIC AND ANAEROBIC 8CC  Final   Culture NO GROWTH 5 DAYS  Final   Report Status 06/25/2016 FINAL  Final  Urine culture     Status: None   Collection Time: 06/20/16  9:40 PM  Result Value Ref Range Status   Specimen Description URINE, RANDOM  Final   Special Requests NONE  Final   Culture NO GROWTH Performed at [facility]   Final   Report Status 06/22/2016 FINAL  Final    RADIOLOGY:  Dg Chest 2 View  Result  Date: 06/24/2016 CLINICAL DATA:  Hospitalized for pneumonia 1 week ago. Dry cough. Oxygen requirement. EXAM: CHEST  2 VIEW COMPARISON:  06/22/2016 FINDINGS: There is increased right paratracheal and left paratracheal density, suspicious for superior mediastinal mass or thyroid enlargement. Heart is upper limits normal and accentuated by the AP position of patient. There are mildly prominent interstitial markings there has been some improvement in aeration of the lung bases. No new consolidations or pleural effusions are identified. IMPRESSION: 1. Suspect mild interstitial edema. 2. Question of thyroid enlargement versus mediastinal mass. Recommend further evaluation. Initial evaluation with PA and lateral chest x-ray is recommended. If density persists, CT of the chest is recommended. Intravenous contrast is recommended unless it is contraindicated. These results will be called to the ordering clinician or representative by the Radiologist Assistant, and communication documented in the PACS or zVision Dashboard. Electronically Signed   By: Nolon Nations M.D.   On: 06/24/2016 11:00   Ct Chest W Contrast  Result Date: 06/24/2016 CLINICAL DATA:  Followup abnormal chest radiograph EXAM: CT CHEST WITH CONTRAST TECHNIQUE: Multidetector CT imaging of the chest was performed during intravenous contrast administration. CONTRAST:  16mL ISOVUE-300 IOPAMIDOL (ISOVUE-300) INJECTION 61% COMPARISON:  None FINDINGS: Cardiovascular: Moderate cardiac enlargement. Aortic atherosclerosis noted. Calcifications in the LAD, RCA and left circumflex coronary artery noted. Mediastinum/Nodes: The trachea appears patent and is midline. Normal appearance of the esophagus. Prominent mediastinal lymph nodes are identified. Index right paratracheal lymph node measures 1 cm, image 62 of series 2. Lungs/Pleura: Small pleural effusions are noted right greater than left. Moderate to advanced changes of centrilobular and paraseptal emphysema  identified. Diffuse bronchial wall thickening There is mild interlobular septal thickening identified in both lungs which appear basilar predominant especially within the right lung.  Perifissural nodule in the right middle lobe measures scratch sec calcified granuloma is identified in the right Upper Abdomen: Previous coli cystectomy. Pneumobilia identified compatible with biliary patency. No acute findings identified within the upper abdomen. Musculoskeletal: No aggressive lytic or sclerotic bone lesions. No significant bone abnormality noted. IMPRESSION: 1. Mild cardiac enlargement, multi vessel coronary artery calcifications, pleural effusions and interstitial edema suspicious for CHF. 2. Diffuse bronchial wall thickening with emphysema, as above; imaging findings suggestive of underlying COPD. 3. Borderline enlarged right paratracheal lymph nodes identified. Nonspecific in the setting of CHF. Electronically Signed   By: Kerby Moors M.D.   On: 06/24/2016 20:20     Management plans discussed with the patient, family and they are in agreement.  CODE STATUS:     Code Status Orders        Start     Ordered   06/21/16 0223  Limited resuscitation (code)  Continuous    Question Answer Comment  In the event of cardiac or respiratory ARREST: Initiate Code Blue, Call Rapid Response Yes   In the event of cardiac or respiratory ARREST: Perform CPR Yes   In the event of cardiac or respiratory ARREST: Perform Intubation/Mechanical Ventilation No   In the event of cardiac or respiratory ARREST: Use NIPPV/BiPAp only if indicated Yes   In the event of cardiac or respiratory ARREST: Administer ACLS medications if indicated Yes   In the event of cardiac or respiratory ARREST: Perform Defibrillation or Cardioversion if indicated Yes      06/21/16 0222    Code Status History    Date Active Date Inactive Code Status Order ID Comments User Context   02/26/2016  2:34 PM 02/27/2016  3:03 PM Partial Code  [id number]  Vaughan Basta, MD Inpatient   08/06/2015  2:59 AM 08/11/2015  3:21 PM Full Code [id number]  Lytle Butte, MD ED    Advance Directive Documentation   Flowsheet Row Most Recent Value  Type of Advance Directive  Healthcare Power of Attorney  Pre-existing out of facility DNR order (yellow form or pink MOST form)  No data  "MOST" Form in Place?  No data      TOTAL TIME TAKING CARE OF THIS PATIENT: 33 minutes.    Hyacinth Marcelli,  Karenann Cai.D on 06/26/2016 at 8:27 AM  Between 7am to 6pm - Pager - [phone]  After 6pm go to [email or web address] - Proofreader  [facility]  Office  [phone]  CC: Primary care physician; Allyne Gee, MD

## 2016-06-28 ENCOUNTER — Other Ambulatory Visit: Payer: Self-pay

## 2016-06-28 NOTE — Patient Outreach (Signed)
Transition of care call: Placed call to patient and explained reason for call. Patient states " I do not need your services" . I explained to patient that he agreed to services while he was admitted.  Patient states I am doing fine. I offered to call patients daughter to confirm that he is not interested in services and he agreed for me to call his daughter.   Placed call and spoke with Thomas Townsend ( daughter).  Daughter reports that patient needs this service.  Daughter offered to meet at patients home for an initial transition of care on October 11th. Confirmed address.  Daughter states that patient has all his medications. Denies follow up plans with primary MD.  Reports that patient is going to the cancer center on Friday for office visit.  Daughter states patient has transportation to MD appointment.   PLAN: Home visit on 07/07/2016. Will notify MD of patient being active for transition of care. Will send barrier letter.  [facility] CM Care Plan Problem One   Flowsheet Row Most Recent Value  Care Plan Problem One  Recent admission for pneumonia  Role Documenting the Problem One  Care Management [place] for Problem One  Active  THN Long Term Goal (31-90 days)  Patient will report no readmissions in the next 31 days.  THN Long Term Goal Start Date  06/28/16  Interventions for Problem One Long Term Goal  reviewed discharge instructions with daughter via phone. home visit planned.  THN CM Short Term Goal #1 (0-30 days)  Patient and or daughter will make a follow up appointment with primary MD within 10 days.  THN CM Short Term Goal #1 Start Date  06/28/16  Interventions for Short Term Goal #1  Reviewed importance of timely follow up with MD.  [facility] CM Short Term Goal #2 (0-30 days)  Patient will report using oxygen as prescribed daily for the next 30 days.  THN CM Short Term Goal #2 Start Date  06/28/16  Interventions for Short Term Goal #2  reviewed importance of using oxygen as  prescribed by md.      Tomasa Rand, RN, BSN, CEN [place] Coordinator [phone]

## 2016-06-29 LAB — COMP PANEL: LEUKEMIA/LYMPHOMA

## 2016-07-02 ENCOUNTER — Other Ambulatory Visit: Payer: Self-pay

## 2016-07-02 ENCOUNTER — Inpatient Hospital Stay: Payer: Medicare Other | Attending: Internal Medicine

## 2016-07-02 DIAGNOSIS — D72829 Elevated white blood cell count, unspecified: Secondary | ICD-10-CM

## 2016-07-02 DIAGNOSIS — D509 Iron deficiency anemia, unspecified: Secondary | ICD-10-CM

## 2016-07-02 LAB — CBC WITH DIFFERENTIAL/PLATELET
BAND NEUTROPHILS: 2 %
BASOS ABS: 0.3 10*3/uL — AB (ref 0–0.1)
BASOS PCT: 1 %
EOS ABS: 0.9 10*3/uL — AB (ref 0–0.7)
Eosinophils Relative: 3 %
HCT: 29.3 % — ABNORMAL LOW (ref 40.0–52.0)
HEMOGLOBIN: 9.5 g/dL — AB (ref 13.0–18.0)
LYMPHS PCT: 3 %
Lymphs Abs: 0.9 10*3/uL — ABNORMAL LOW (ref 1.0–3.6)
MCH: 31.8 pg (ref 26.0–34.0)
MCHC: 32.5 g/dL (ref 32.0–36.0)
MCV: 97.8 fL (ref 80.0–100.0)
MONOS PCT: 8 %
Metamyelocytes Relative: 2 %
Monocytes Absolute: 2.5 10*3/uL — ABNORMAL HIGH (ref 0.2–1.0)
NEUTROS PCT: 81 %
Neutro Abs: 26.4 10*3/uL — ABNORMAL HIGH (ref 1.4–6.5)
PLATELETS: 402 10*3/uL (ref 150–440)
RBC: 2.99 MIL/uL — ABNORMAL LOW (ref 4.40–5.90)
RDW: 20.3 % — ABNORMAL HIGH (ref 11.5–14.5)
WBC: 31 10*3/uL — ABNORMAL HIGH (ref 3.8–10.6)

## 2016-07-02 LAB — IRON AND TIBC
IRON: 101 ug/dL (ref 45–182)
SATURATION RATIOS: 47 % — AB (ref 17.9–39.5)
TIBC: 216 ug/dL — ABNORMAL LOW (ref 250–450)
UIBC: 115 ug/dL

## 2016-07-02 LAB — VITAMIN B12: VITAMIN B 12: 454 pg/mL (ref 180–914)

## 2016-07-02 LAB — FOLATE: Folate: 7.3 ng/mL (ref >5.9)

## 2016-07-02 LAB — FERRITIN: FERRITIN: 489 ng/mL — AB (ref 24–336)

## 2016-07-02 LAB — LACTATE DEHYDROGENASE: LDH: 203 U/L — AB (ref 98–192)

## 2016-07-05 ENCOUNTER — Other Ambulatory Visit: Payer: Self-pay | Admitting: Internal Medicine

## 2016-07-06 DIAGNOSIS — Z0001 Encounter for general adult medical examination with abnormal findings: Secondary | ICD-10-CM | POA: Diagnosis not present

## 2016-07-06 DIAGNOSIS — J449 Chronic obstructive pulmonary disease, unspecified: Secondary | ICD-10-CM | POA: Diagnosis not present

## 2016-07-06 DIAGNOSIS — M064 Inflammatory polyarthropathy: Secondary | ICD-10-CM | POA: Diagnosis not present

## 2016-07-06 DIAGNOSIS — I482 Chronic atrial fibrillation: Secondary | ICD-10-CM | POA: Diagnosis not present

## 2016-07-06 DIAGNOSIS — C9290 Myeloid leukemia, unspecified, not having achieved remission: Secondary | ICD-10-CM | POA: Diagnosis not present

## 2016-07-06 DIAGNOSIS — K120 Recurrent oral aphthae: Secondary | ICD-10-CM | POA: Diagnosis not present

## 2016-07-07 ENCOUNTER — Other Ambulatory Visit: Payer: Self-pay

## 2016-07-07 ENCOUNTER — Ambulatory Visit
Admission: RE | Admit: 2016-07-07 | Discharge: 2016-07-07 | Disposition: A | Discharge status: Home / Self Care | Payer: Medicare Other | Source: Ambulatory Visit | Attending: Physician Assistant | Admitting: Physician Assistant

## 2016-07-07 ENCOUNTER — Other Ambulatory Visit: Payer: Self-pay | Admitting: Physician Assistant

## 2016-07-07 DIAGNOSIS — R938 Abnormal findings on diagnostic imaging of other specified body structures: Secondary | ICD-10-CM | POA: Diagnosis not present

## 2016-07-07 DIAGNOSIS — J449 Chronic obstructive pulmonary disease, unspecified: Secondary | ICD-10-CM | POA: Insufficient documentation

## 2016-07-07 DIAGNOSIS — J189 Pneumonia, unspecified organism: Secondary | ICD-10-CM | POA: Diagnosis not present

## 2016-07-07 NOTE — Patient Outreach (Signed)
[facility]) Care Management   2016/07/29  SHOAIB SIEFKER 04/20/1931 [id number]  MACALLISTER ASHMEAD is an 82 y.o. male Arrived for scheduled home visit.. Patient standing outside on deck when I arrived.  Daughter [name] present during home visit and actively engaged.  Subjective: Patient reports that he is doing well. States that he saw his MD yesterday. Reports "cold sores" on his mouth.  States that he is taking all his medications as prescribed. States that he gets sick quickly and then becomes confused.  States he got stung by some bees and then went to the store. Was found at the store to be confused. EMS was called and patient was admitted for pneumonia. Patient reports that he is now on oxygen but states that he is not convinced that he needs it.  Patients son lives with him during the day because he works night shift. Patient lives home alone at night.  Patient reports that he is able to drive and run errands himself.   Objective:  Home neat and clean. Patient ambulating without difficultly and without any assistive devices. Able to show me his calendar with his current appointments. When discussing depression patient was tearful and states that his brother died 56 month ago.  Vitals:   Jul 29, 2016 1011  BP: 118/62  Pulse: (!) 106  Resp: 16  SpO2: 98%  Weight: 145 lb (65.8 kg)  Height: 1.702 m ('5\' 7"' )   Review of Systems  Constitutional: Negative.   HENT: Negative.   Eyes: Negative.   Respiratory:       Reports his is now wearing oxygen that is new for him.   Cardiovascular: Negative.   Gastrointestinal: Negative.   Genitourinary: Positive for frequency.  Musculoskeletal: Negative.   Skin:       Reports blister areas to mouth. Reports that he spoke with MD about this yesterday and he has to go to [facility] to pick up RX.   Neurological: Negative.   Endo/Heme/Allergies: Bruises/bleeds easily.  Psychiatric/Behavioral: Negative.     Physical Exam  Constitutional: He  is oriented to person, place, and time. He appears well-developed and well-nourished.  HENT:  Hard of hearing  Eyes:  Wearing glasses  Cardiovascular: Normal rate and intact distal pulses.   Irregular rhythm  Respiratory: Effort normal and breath sounds normal.  Lungs clear  GI: Soft. Bowel sounds are normal.  Musculoskeletal: Normal range of motion. He exhibits no edema.  Neurological: He is alert and oriented to person, place, and time.  Skin: Skin is warm and dry.  Psychiatric: He has a normal mood and affect. His behavior is normal. Judgment and thought content normal.    Encounter Medications:   Outpatient Encounter Prescriptions as of 07/29/2016  Medication Sig  . BREO ELLIPTA 100-25 MCG/INH AEPB Inhale 1 puff into the lungs every morning.  . digoxin (LANOXIN) 0.125 MG tablet Take 0.125 mg by mouth every morning.   . ferrous sulfate 325 (65 FE) MG tablet Take 325 mg by mouth every morning. Reported on 02/25/2016  . furosemide (LASIX) 20 MG tablet Take 20 mg by mouth every other day. In the morning  . methotrexate (RHEUMATREX) 2.5 MG tablet TAKE 6 TABLETS (15 MG TOTAL) BY MOUTH EVERY 7 (SEVEN) DAYS.  Marland Kitchen montelukast (SINGULAIR) 10 MG tablet Take 1 tablet by mouth every morning.   . Multiple Vitamins-Minerals (PRESERVISION AREDS PO) Take 1 tablet by mouth every morning.   . warfarin (COUMADIN) 4 MG tablet Take 4 mg by mouth every  morning.   . folic acid (FOLVITE) 1 MG tablet Take 1 mg by mouth daily.   Marland Kitchen ibuprofen (ADVIL) 200 MG tablet Take 400 mg by mouth every morning.  . predniSONE (STERAPRED UNI-PAK 21 TAB) 10 MG (21) TBPK tablet 39m x1 day, 268mx2 day, 105m2 day then stop (Patient not taking: Reported on 07/07/2016)   No facility-administered encounter medications on file as of 07/07/2016.     Functional Status:   In your present state of health, do you have any difficulty performing the following activities: 07/07/2016 06/21/2016  Hearing? Y YTempie Donningision? N N  Difficulty  concentrating or making decisions? Y N  Walking or climbing stairs? N Y  Dressing or bathing? N N  Doing errands, shopping? N N  Preparing Food and eating ? N -  Using the Toilet? N -  In the past six months, have you accidently leaked urine? N -  Do you have problems with loss of bowel control? N -  Managing your Medications? N -  Managing your Finances? N -  Housekeeping or managing your Housekeeping? N -  Some recent data might be hidden    Fall/Depression Screening:    PHQ 2/9 Scores 07/07/2016  PHQ - 2 Score 0   Fall Risk  07/07/2016  Falls in the past year? No   Assessment:  (1) reviewed THN case management program and transition of care.  Patient able to show me his new patient packet and his magnet is on the refrigerator. Contact card provided. (2) lack of COPD education and knowledge of zones. (3) has not completed advanced directives. (4) patient reports that he is not taking his prednisone and folic acid. Daughter called MD office for clarifications.  Plan:  (1) reviewed consent. Patient declines any changes needed. Will continue to outreach patient  Weekly for follow up. (2) Recent admission for pneumonia and COPD. New to oxygen. Review oxygen safety, EMMI provided. [facility]ided [facility] and reviewed COPD zones. Reviewed importance of early recognition of symptoms with patient and son and daughter. (3) provided advanced directives packet and reviewed importance of completion. (4) reviewed with patient the importance of taking all medications as prescribed. ( even oxygen)   Care planning and goal setting during home visit and primary goal is to avoid readmission. Will continue to follow for transition of care weekly.  Scheduled next home visit for 08/04/2016 at 10 am. Wrote this appointment on patients calendar.   [facility] Care Plan Problem One   Flowsheet Row Most Recent Value  Care Plan Problem One  Recent admission for pneumonia  Role Documenting the Problem One   Care Management [place] Problem One  Active  THN Long Term Goal (31-90 days)  Patient will report no readmissions in the next 31 days.  THN Long Term Goal Start Date  06/28/16  Interventions for Problem One Long Term Goal  Home visit completed.  Reviewed early recognition of symptoms, COPD zones and EMMI education.  THN CM Short Term Goal #1 (0-30 days)  Patient and or daughter will make a follow up appointment with primary MD within 10 days.  THN CM Short Term Goal #1 Start Date  06/28/16  [facility] Goal #[facility] Met Date  07/07/16  Interventions for Short Term Goal #1  Reviewed importance of timely follow up with MD.  [facility] (0-30 days)  Patient will report using oxygen as prescribed daily for the next 30  days.  THN CM Short Term Goal #2 Start Date  06/28/16  Interventions for Short Term Goal #2  Discussed importance of using oxygen as prescribed.  Provided EMMU education about Oxygen safety.      This note routed to MD. Tomasa Rand, RN, BSN, CEN [place] Coordinator [phone]

## 2016-07-12 ENCOUNTER — Other Ambulatory Visit: Payer: Self-pay | Admitting: Internal Medicine

## 2016-07-12 DIAGNOSIS — J189 Pneumonia, unspecified organism: Secondary | ICD-10-CM

## 2016-07-13 ENCOUNTER — Ambulatory Visit
Admission: RE | Admit: 2016-07-13 | Discharge: 2016-07-13 | Disposition: A | Discharge status: Home / Self Care | Payer: Medicare Other | Source: Ambulatory Visit | Attending: Internal Medicine | Admitting: Internal Medicine

## 2016-07-13 ENCOUNTER — Other Ambulatory Visit: Payer: Self-pay

## 2016-07-13 DIAGNOSIS — J9811 Atelectasis: Secondary | ICD-10-CM | POA: Diagnosis not present

## 2016-07-13 DIAGNOSIS — J449 Chronic obstructive pulmonary disease, unspecified: Secondary | ICD-10-CM | POA: Diagnosis not present

## 2016-07-13 DIAGNOSIS — J189 Pneumonia, unspecified organism: Secondary | ICD-10-CM

## 2016-07-13 NOTE — Patient Outreach (Signed)
Transition of care: Placed call to patient for weekly transition of care. No answer. Left a message requesting a call back.  PLAN: Will await a call back. If no response will call again.  Tomasa Rand, RN, BSN, CEN [facility] [employer] [phone]

## 2016-07-13 NOTE — Patient Outreach (Signed)
Transition of care call: Patient returned call and states that he is doing well. Reports mouth sores healing. Denies any new problems or concerns today.  PLAN: Will continue weekly transition of care calls. Tomasa Rand, RN, BSN, CEN [facility] [employer] [phone]

## 2016-07-14 DIAGNOSIS — I480 Paroxysmal atrial fibrillation: Secondary | ICD-10-CM | POA: Diagnosis not present

## 2016-07-21 ENCOUNTER — Other Ambulatory Visit: Payer: Self-pay

## 2016-07-21 ENCOUNTER — Ambulatory Visit: Payer: Self-pay

## 2016-07-21 NOTE — Patient Outreach (Signed)
Transition of care:  Placed call to patient who answered and identified himself. States that he is doing well. Denies any currently problem breathing.States he is eating and sleeping well.  PLAN: Will continue weekly transition of care calls.   Tomasa Rand, RN, BSN, CEN [facility] [employer] [phone]

## 2016-07-26 DIAGNOSIS — J189 Pneumonia, unspecified organism: Secondary | ICD-10-CM | POA: Diagnosis not present

## 2016-07-26 DIAGNOSIS — H02831 Dermatochalasis of right upper eyelid: Secondary | ICD-10-CM | POA: Diagnosis not present

## 2016-07-26 DIAGNOSIS — H02834 Dermatochalasis of left upper eyelid: Secondary | ICD-10-CM | POA: Diagnosis not present

## 2016-07-26 DIAGNOSIS — L908 Other atrophic disorders of skin: Secondary | ICD-10-CM | POA: Diagnosis not present

## 2016-07-29 ENCOUNTER — Other Ambulatory Visit: Payer: Self-pay

## 2016-07-29 DIAGNOSIS — H02832 Dermatochalasis of right lower eyelid: Secondary | ICD-10-CM | POA: Diagnosis not present

## 2016-07-29 DIAGNOSIS — H5213 Myopia, bilateral: Secondary | ICD-10-CM | POA: Diagnosis not present

## 2016-07-29 DIAGNOSIS — H02834 Dermatochalasis of left upper eyelid: Secondary | ICD-10-CM | POA: Diagnosis not present

## 2016-07-29 DIAGNOSIS — H04123 Dry eye syndrome of bilateral lacrimal glands: Secondary | ICD-10-CM | POA: Diagnosis not present

## 2016-07-29 DIAGNOSIS — H02831 Dermatochalasis of right upper eyelid: Secondary | ICD-10-CM | POA: Diagnosis not present

## 2016-07-29 NOTE — Patient Outreach (Signed)
Transition of care" Placed call to patient for weekly transition of care. No answer. Left a message requesting a call back.  PLAN: Will await a call back. Home visit planned for next week.  Tomasa Rand, RN, BSN, CEN [facility] [employer] [phone]

## 2016-07-29 NOTE — Patient Outreach (Signed)
Transition of care call: Patient returned call and states that he is doing good. No new problems. Reports that he went to eye doctor today and had to get new glasses.  PLAN: Reminded patient of home visit planned for next week.  Tomasa Rand, RN, BSN, CEN [facility] [employer] [phone]

## 2016-07-30 ENCOUNTER — Other Ambulatory Visit: Payer: Self-pay

## 2016-07-30 NOTE — Patient Outreach (Signed)
Care coordination: Patient called and requested to change his home visit from 08/04/2016 to 08/09/2016.  PLAN: Appointment changed.  Tomasa Rand, RN, BSN, CEN [facility] [employer] [phone]

## 2016-08-04 ENCOUNTER — Ambulatory Visit: Payer: Self-pay

## 2016-08-09 ENCOUNTER — Other Ambulatory Visit: Payer: Self-pay

## 2016-08-09 NOTE — Patient Outreach (Signed)
[facility]) Care Management   08/09/2016  Thomas Townsend [birth year] [id number]  Thomas Townsend is an 82 y.o. male 10:00 Arrived for scheduled home visit. Patient standing on deck. Subjective: Patient reports that he is doing very well. Denies any recent problems or concerns. States no problems breathing. Reports that he uses his oxygen all the time when he is at home. Reports that he uses his oxygen as needed when he leaves home. Reports that he has remained active. Reports blowing leaves yesterday in his yard.  Patient reports that he continues to take all his medications as prescribed. Reports that he is looking forward to having eyelid surgery on 09/07/2016.   Objective:   Review of Systems  Constitutional: Negative.   HENT: Negative.   Eyes:       Reports that he is having eye lid surgery on 09/07/2016  Respiratory: Negative.   Cardiovascular: Negative.   Gastrointestinal: Negative.   Genitourinary: Negative.   Musculoskeletal: Negative.   Skin: Negative.   Neurological: Negative.   Endo/Heme/Allergies: Bruises/bleeds easily.  Psychiatric/Behavioral: Negative.     Physical Exam  Constitutional: He is oriented to person, place, and time. He appears well-developed and well-nourished.  Cardiovascular: Normal rate and normal heart sounds.   Respiratory: Effort normal and breath sounds normal.  GI: Soft. Bowel sounds are normal.  Musculoskeletal: Normal range of motion.  Neurological: He is alert and oriented to person, place, and time.  Skin: Skin is warm and dry.  Psychiatric: He has a normal mood and affect. His behavior is normal. Judgment and thought content normal.    Encounter Medications:   Outpatient Encounter Prescriptions as of 08/09/2016  Medication Sig  . BREO ELLIPTA 100-25 MCG/INH AEPB Inhale 1 puff into the lungs every morning.  . digoxin (LANOXIN) 0.125 MG tablet Take 0.125 mg by mouth every morning.   . ferrous sulfate 325 (65 FE) MG  tablet Take 325 mg by mouth every morning. Reported on 8/84/1660  . folic acid (FOLVITE) 1 MG tablet Take 1 mg by mouth daily.   . furosemide (LASIX) 20 MG tablet Take 20 mg by mouth every other day. In the morning  . methotrexate (RHEUMATREX) 2.5 MG tablet TAKE 6 TABLETS (15 MG TOTAL) BY MOUTH EVERY 7 (SEVEN) DAYS.  Marland Kitchen montelukast (SINGULAIR) 10 MG tablet Take 1 tablet by mouth every morning.   . Multiple Vitamins-Minerals (PRESERVISION AREDS PO) Take 1 tablet by mouth every morning.   . warfarin (COUMADIN) 4 MG tablet Take 4 mg by mouth every morning.   Marland Kitchen ibuprofen (ADVIL) 200 MG tablet Take 400 mg by mouth every morning.  . predniSONE (STERAPRED UNI-PAK 21 TAB) 10 MG (21) TBPK tablet 30m x1 day, 282mx2 day, 1047m2 day then stop (Patient not taking: Reported on 08/09/2016)   No facility-administered encounter medications on file as of 08/09/2016.     Functional Status:   In your present state of health, do you have any difficulty performing the following activities: 07/07/2016 06/21/2016  Hearing? Y YTempie Donningision? N N  Difficulty concentrating or making decisions? Y N  Walking or climbing stairs? N Y  Dressing or bathing? N N  Doing errands, shopping? N N  Preparing Food and eating ? N -  Using the Toilet? N -  In the past six months, have you accidently leaked urine? N -  Do you have problems with loss of bowel control? N -  Managing your Medications? N -  Managing your Finances?  N -  Housekeeping or managing your Housekeeping? N -  Some recent data might be hidden    Fall/Depression Screening:    PHQ 2/9 Scores 07/07/2016  PHQ - 2 Score 0    Assessment:   (1) Patient has successfully completed transition of care program. Spoke with daughter via phone and patient in person and both agree that patient no longer needs this case management program. Denies any further needs or concerns.  (2) continues to use oxygen as prescribed and take all medications as prescribed.   Plan:   (1) will plan to close case as patient has achieved his goals.  Will send patient a letter of case closure for future reference. Will send MD letter as well and ask case management assistant to close case. (2) Encouraged patient to continue to use oxygen and call MD for any future problems.  Goals met. Case closed.  [facility] CM Care Plan Problem One   Flowsheet Row Most Recent Value  Care Plan Problem One  Recent admission for pneumonia  Role Documenting the Problem One  Care Management [place] for Problem One  Active  THN Long Term Goal (31-90 days)  Patient will report no readmissions in the next 31 days.  THN Long Term Goal Start Date  06/28/16  THN Long Term Goal Met Date  08/09/16  Interventions for Problem One Long Term Goal  Discussed with patient to call MD for any new symptoms of shortness of breath or fever.  THN CM Short Term Goal #1 (0-30 days)  Patient and or daughter will make a follow up appointment with primary MD within 10 days.  THN CM Short Term Goal #1 Start Date  06/28/16  [facility] CM Short Term Goal #1 Met Date  07/07/16  Interventions for Short Term Goal #1  Reviewed importance of timely follow up with MD.  [facility] CM Short Term Goal #2 (0-30 days)  Patient will report using oxygen as prescribed daily for the next 30 days.  THN CM Short Term Goal #2 Start Date  06/28/16  [facility] CM Short Term Goal #2 Met Date  08/09/16  Interventions for Short Term Goal #2  Reviewed importance of using oxygen as prescribed.      Tomasa Rand, RN, BSN, CEN [facility] [employer] [phone]

## 2016-08-10 DIAGNOSIS — I480 Paroxysmal atrial fibrillation: Secondary | ICD-10-CM | POA: Diagnosis not present

## 2016-08-10 DIAGNOSIS — M0579 Rheumatoid arthritis with rheumatoid factor of multiple sites without organ or systems involvement: Secondary | ICD-10-CM | POA: Diagnosis not present

## 2016-08-17 DIAGNOSIS — M25511 Pain in right shoulder: Secondary | ICD-10-CM | POA: Diagnosis not present

## 2016-08-17 DIAGNOSIS — M0579 Rheumatoid arthritis with rheumatoid factor of multiple sites without organ or systems involvement: Secondary | ICD-10-CM | POA: Diagnosis not present

## 2016-08-17 DIAGNOSIS — C9110 Chronic lymphocytic leukemia of B-cell type not having achieved remission: Secondary | ICD-10-CM | POA: Diagnosis not present

## 2016-08-25 DIAGNOSIS — J449 Chronic obstructive pulmonary disease, unspecified: Secondary | ICD-10-CM | POA: Diagnosis not present

## 2016-08-25 DIAGNOSIS — M069 Rheumatoid arthritis, unspecified: Secondary | ICD-10-CM | POA: Diagnosis not present

## 2016-08-25 DIAGNOSIS — Z01818 Encounter for other preprocedural examination: Secondary | ICD-10-CM | POA: Diagnosis not present

## 2016-08-25 DIAGNOSIS — C9110 Chronic lymphocytic leukemia of B-cell type not having achieved remission: Secondary | ICD-10-CM | POA: Diagnosis not present

## 2016-08-25 DIAGNOSIS — I4891 Unspecified atrial fibrillation: Secondary | ICD-10-CM | POA: Diagnosis not present

## 2016-08-26 ENCOUNTER — Inpatient Hospital Stay: Payer: Medicare Other | Attending: Internal Medicine

## 2016-08-26 ENCOUNTER — Inpatient Hospital Stay (HOSPITAL_BASED_OUTPATIENT_CLINIC_OR_DEPARTMENT_OTHER): Payer: Medicare Other | Admitting: Internal Medicine

## 2016-08-26 DIAGNOSIS — E785 Hyperlipidemia, unspecified: Secondary | ICD-10-CM

## 2016-08-26 DIAGNOSIS — Z87891 Personal history of nicotine dependence: Secondary | ICD-10-CM

## 2016-08-26 DIAGNOSIS — Z7901 Long term (current) use of anticoagulants: Secondary | ICD-10-CM | POA: Diagnosis not present

## 2016-08-26 DIAGNOSIS — D72828 Other elevated white blood cell count: Secondary | ICD-10-CM

## 2016-08-26 DIAGNOSIS — D72829 Elevated white blood cell count, unspecified: Secondary | ICD-10-CM | POA: Diagnosis not present

## 2016-08-26 DIAGNOSIS — D509 Iron deficiency anemia, unspecified: Secondary | ICD-10-CM | POA: Diagnosis not present

## 2016-08-26 DIAGNOSIS — Z8546 Personal history of malignant neoplasm of prostate: Secondary | ICD-10-CM | POA: Insufficient documentation

## 2016-08-26 DIAGNOSIS — D500 Iron deficiency anemia secondary to blood loss (chronic): Secondary | ICD-10-CM | POA: Insufficient documentation

## 2016-08-26 DIAGNOSIS — I509 Heart failure, unspecified: Secondary | ICD-10-CM | POA: Insufficient documentation

## 2016-08-26 DIAGNOSIS — J189 Pneumonia, unspecified organism: Secondary | ICD-10-CM | POA: Diagnosis not present

## 2016-08-26 DIAGNOSIS — Z79899 Other long term (current) drug therapy: Secondary | ICD-10-CM

## 2016-08-26 DIAGNOSIS — M069 Rheumatoid arthritis, unspecified: Secondary | ICD-10-CM

## 2016-08-26 DIAGNOSIS — J449 Chronic obstructive pulmonary disease, unspecified: Secondary | ICD-10-CM

## 2016-08-26 DIAGNOSIS — K219 Gastro-esophageal reflux disease without esophagitis: Secondary | ICD-10-CM | POA: Insufficient documentation

## 2016-08-26 DIAGNOSIS — Z9049 Acquired absence of other specified parts of digestive tract: Secondary | ICD-10-CM | POA: Diagnosis not present

## 2016-08-26 DIAGNOSIS — I10 Essential (primary) hypertension: Secondary | ICD-10-CM

## 2016-08-26 DIAGNOSIS — I4891 Unspecified atrial fibrillation: Secondary | ICD-10-CM

## 2016-08-26 LAB — CBC WITH DIFFERENTIAL/PLATELET
BAND NEUTROPHILS: 5 %
Basophils Absolute: 0.2 10*3/uL — ABNORMAL HIGH (ref 0–0.1)
Basophils Relative: 1 %
Eosinophils Absolute: 0.2 10*3/uL (ref 0–0.7)
Eosinophils Relative: 1 %
HEMATOCRIT: 32.4 % — AB (ref 40.0–52.0)
Hemoglobin: 10.7 g/dL — ABNORMAL LOW (ref 13.0–18.0)
LYMPHS ABS: 2.3 10*3/uL (ref 1.0–3.6)
Lymphocytes Relative: 10 %
MCH: 31 pg (ref 26.0–34.0)
MCHC: 33.1 g/dL (ref 32.0–36.0)
MCV: 93.6 fL (ref 80.0–100.0)
MONOS PCT: 12 %
Monocytes Absolute: 2.7 10*3/uL — ABNORMAL HIGH (ref 0.2–1.0)
NEUTROS ABS: 17.3 10*3/uL — AB (ref 1.4–6.5)
Neutrophils Relative %: 71 %
PLATELETS: 217 10*3/uL (ref 150–440)
RBC: 3.46 MIL/uL — AB (ref 4.40–5.90)
RDW: 17.6 % — AB (ref 11.5–14.5)
WBC: 22.7 10*3/uL — AB (ref 3.8–10.6)

## 2016-08-26 LAB — COMPREHENSIVE METABOLIC PANEL
ALBUMIN: 3.5 g/dL (ref 3.5–5.0)
ALT: 11 U/L — ABNORMAL LOW (ref 17–63)
ANION GAP: 7 (ref 5–15)
AST: 17 U/L (ref 15–41)
Alkaline Phosphatase: 79 U/L (ref 38–126)
BILIRUBIN TOTAL: 0.6 mg/dL (ref 0.3–1.2)
BUN: 24 mg/dL — ABNORMAL HIGH (ref 6–20)
CHLORIDE: 104 mmol/L (ref 101–111)
CO2: 25 mmol/L (ref 22–32)
Calcium: 8.4 mg/dL — ABNORMAL LOW (ref 8.9–10.3)
Creatinine, Ser: 0.95 mg/dL (ref 0.61–1.24)
GFR calc Af Amer: >60 mL/min (ref >60)
Glucose, Bld: 136 mg/dL — ABNORMAL HIGH (ref 65–99)
POTASSIUM: 3.6 mmol/L (ref 3.5–5.1)
Sodium: 136 mmol/L (ref 135–145)
TOTAL PROTEIN: 6.9 g/dL (ref 6.5–8.1)

## 2016-08-26 LAB — IRON AND TIBC
IRON: 39 ug/dL — AB (ref 45–182)
SATURATION RATIOS: 19 % (ref 17.9–39.5)
TIBC: 201 ug/dL — AB (ref 250–450)
UIBC: 162 ug/dL

## 2016-08-26 LAB — FERRITIN: FERRITIN: 332 ng/mL (ref 24–336)

## 2016-08-26 NOTE — Assessment & Plan Note (Addendum)
Anemia hemoglobin 10.7/ unclear etiology. Iron studies are suggestive of iron deficiency. Question primary bone marrow disease. Previous bone marrow biopsy- no obvious cause of anemia noted. If it gets worse recommend a repeat bone marrow biopsy.  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 Previous bone marrow biopsy reviewed. Recent elevation up to 50,000 while in hospital with an infection. However today  White count is fairly stable- around 21,000 predominant neutrophilia. Monitor for now. If it gets recommend a repeat bone marrow biopsy. Patient agrees.   # Follow-up in 6 months with labs/iron studies.

## 2016-08-26 NOTE — Progress Notes (Signed)
[facility] OFFICE PROGRESS NOTE  Patient Care Team: Allyne Gee, MD as PCP - General (Internal Medicine)   SUMMARY OF HEMATOLOGIC/ONCOLOGIC HISTORY:  # Leucocytosis- 20-30K [Dr.Pandit-BMBx 2013-No definitive features of a Myeloproliferative neoplasia including CMML, normal cytogenetics (69 XY. Normocellular to focally mildly hypocellular marrow for age (30-50%) with trilineage hematopoiesis, no overt monocytosis ; Flow study unremarkable. Peripheral blood flow- suggestive of chronic myelomonocytic leukemia]; JAK2/Bcr-Abl-NEG.2013- [place]- No hepato-splenomegaly.   # IDA- ? Etiology.  # RA on MXT; prednisone 59m discon  INTERVAL HISTORY:  A very pleasant 82-year-old male patient with a prior history of long-standing leukocytosis and mild anemia is here for follow-up.   In the interim patient was admitted to the hospital for shortness of breath/pneumonia/COPD. His white count was elevated up to 50,000.   Today he feels good. Denies any significant fatigue. Denies any blood in stools or black stools. He is on iron therapy. His appetite is good. No weight loss. No night sweats no fevers. Denies any chest pain shortness of breath or cough.  REVIEW OF SYSTEMS:  A complete 10 point review of system is done which is negative except mentioned above/history of present illness.   PAST MEDICAL HISTORY :  Past Medical History:  Diagnosis Date  . Arthritis   . Atrial fibrillation ([place]   . CHF (congestive heart failure) ([place]   . COPD (chronic obstructive pulmonary disease) ([place]   . Dysrhythmia   . GERD (gastroesophageal reflux disease)   . Hyperlipemia   . Hypertension   . Leukocytosis 01/14/2016  . Prostate cancer ([place]     PAST SURGICAL HISTORY :   Past Surgical History:  Procedure Laterality Date  . CHOLECYSTECTOMY    . COLONOSCOPY WITH PROPOFOL N/A 04/04/2015   Procedure: COLONOSCOPY WITH PROPOFOL;  Surgeon: RManya Silvas MD;  [facility];  Service:  Endoscopy;  Laterality: N/A;  . ESOPHAGOGASTRODUODENOSCOPY N/A 04/04/2015   Procedure: ESOPHAGOGASTRODUODENOSCOPY (EGD);  Surgeon: RManya Silvas MD;  [facility];  Service: Endoscopy;  Laterality: N/A;    FAMILY HISTORY :   Family History  Problem Relation Age of Onset  . Hypertension Other     SOCIAL HISTORY:   Social History  Substance Use Topics  . Smoking status: Former Smoker    Years: 20.00    Types: Cigarettes  . Smokeless tobacco: Never Used  . Alcohol use 0.0 oz/week     Comment: Wine every once in a while    ALLERGIES:  has No Known Allergies.  MEDICATIONS:  Current Outpatient Prescriptions  Medication Sig Dispense Refill  . BREO ELLIPTA 100-25 MCG/INH AEPB Inhale 1 puff into the lungs every morning.    . digoxin (LANOXIN) 0.125 MG tablet Take 0.125 mg by mouth every morning.     . ferrous sulfate 325 (65 FE) MG tablet Take 325 mg by mouth every morning. Reported on 56/76/1950   . folic acid (FOLVITE) 1 MG tablet Take 1 mg by mouth daily.   11  . furosemide (LASIX) 20 MG tablet Take 20 mg by mouth every other day. In the morning    . ibuprofen (ADVIL) 200 MG tablet Take 400 mg by mouth every morning.    . methotrexate (RHEUMATREX) 2.5 MG tablet TAKE 6 TABLETS (15 MG TOTAL) BY MOUTH EVERY 7 (SEVEN) DAYS.  4  . montelukast (SINGULAIR) 10 MG tablet Take 1 tablet by mouth every morning.     . Multiple Vitamins-Minerals (PRESERVISION AREDS PO) Take 1 tablet  by mouth every morning.     . predniSONE (STERAPRED UNI-PAK 21 TAB) 10 MG (21) TBPK tablet 90m x1 day, 242mx2 day, 1068m2 day then stop 10 tablet 0  . warfarin (COUMADIN) 4 MG tablet Take 4 mg by mouth every morning.      No current facility-administered medications for this visit.     PHYSICAL EXAMINATION: ECOG PERFORMANCE STATUS: \  BP 109/69 (BP Location: Left Arm, Patient Position: Sitting)   Pulse (!) 120   Temp 97.7 F (36.5 C) (Tympanic)   Wt 141 lb (64 kg)   BMI 22.08 kg/m   Filed  Weights   08/26/16 1451  Weight: 141 lb (64 kg)    GENERAL: Well-nourished well-developed; Alert, no distress and comfortable.   Alone. EYES: no pallor or icterus OROPHARYNX: no thrush or ulceration; dentures.  NECK: supple, no masses felt LYMPH:  no palpable lymphadenopathy in the cervical, axillary or inguinal regions LUNGS: clear to auscultation and  No wheeze or crackles HEART/CVS: regular rate & rhythm and no murmurs; No lower extremity edema ABDOMEN:abdomen soft, non-tender and normal bowel sounds Musculoskeletal:no cyanosis of digits and no clubbing  PSYCH: alert & oriented x 3 with fluent speech NEURO: no focal motor/sensory deficits SKIN:  no rashes or significant lesions  LABORATORY DATA:  I have reviewed the data as listed    Component Value Date/Time   NA 136 08/26/2016 1405   NA 140 01/22/2015 0345   K 3.6 08/26/2016 1405   K 3.7 01/22/2015 0345   CL 104 08/26/2016 1405   CL 106 01/22/2015 0345   CO2 25 08/26/2016 1405   CO2 25 01/22/2015 0345   GLUCOSE 136 (H) 08/26/2016 1405   GLUCOSE 89 01/22/2015 0345   BUN 24 (H) 08/26/2016 1405   BUN 16 01/22/2015 0345   CREATININE 0.95 08/26/2016 1405   CREATININE 0.88 01/22/2015 0345   CALCIUM 8.4 (L) 08/26/2016 1405   CALCIUM 7.7 (L) 01/22/2015 0345   PROT 6.9 08/26/2016 1405   PROT 6.8 01/21/2015 1946   ALBUMIN 3.5 08/26/2016 1405   ALBUMIN 3.3 (L) 01/21/2015 1946   AST 17 08/26/2016 1405   AST 20 01/21/2015 1946   ALT 11 (L) 08/26/2016 1405   ALT 6 (L) 01/21/2015 1946   ALKPHOS 79 08/26/2016 1405   ALKPHOS 86 01/21/2015 1946   BILITOT 0.6 08/26/2016 1405   BILITOT 0.9 01/21/2015 1946   GFRNONAA >60 08/26/2016 1405   GFRNONAA >60 01/22/2015 0345   GFRAA >60 08/26/2016 1405   GFRAA >60 01/22/2015 0345    No results found for: SPEP, UPEP  Lab Results  Component Value Date   WBC 22.7 (H) 08/26/2016   NEUTROABS 17.3 (H) 08/26/2016   HGB 10.7 (L) 08/26/2016   HCT 32.4 (L) 08/26/2016   MCV 93.6  08/26/2016   PLT 217 08/26/2016      Chemistry      Component Value Date/Time   NA 136 08/26/2016 1405   NA 140 01/22/2015 0345   K 3.6 08/26/2016 1405   K 3.7 01/22/2015 0345   CL 104 08/26/2016 1405   CL 106 01/22/2015 0345   CO2 25 08/26/2016 1405   CO2 25 01/22/2015 0345   BUN 24 (H) 08/26/2016 1405   BUN 16 01/22/2015 0345   CREATININE 0.95 08/26/2016 1405   CREATININE 0.88 01/22/2015 0345      Component Value Date/Time   CALCIUM 8.4 (L) 08/26/2016 1405   CALCIUM 7.7 (L) 01/22/2015 0345   ALKPHOS 79  08/26/2016 1405   ALKPHOS 86 01/21/2015 1946   AST 17 08/26/2016 1405   AST 20 01/21/2015 1946   ALT 11 (L) 08/26/2016 1405   ALT 6 (L) 01/21/2015 1946   BILITOT 0.6 08/26/2016 1405   BILITOT 0.9 01/21/2015 1946         ASSESSMENT & PLAN:   Iron deficiency anemia due to chronic blood loss Anemia hemoglobin 10.7/ unclear etiology. Iron studies are suggestive of iron deficiency. Question primary bone marrow disease. Previous bone marrow biopsy- no obvious cause of anemia noted. If it gets worse recommend a repeat bone marrow biopsy.  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 Previous bone marrow biopsy reviewed. Recent elevation up to 50,000 while in hospital with an infection. However today  White count is fairly stable- around 21,000 predominant neutrophilia. Monitor for now. If it gets recommend a repeat bone marrow biopsy. Patient agrees.   # Follow-up in 6 months with labs/iron studies.       Cammie Sickle, MD 08/26/2016 5:27 PM

## 2016-08-26 NOTE — Progress Notes (Signed)
Patient here today to follow up on IDA (iron deficiency anemia).  Patient has no complaints today, Patient states he is scheduled to have surgery to remove skin over his eyes in dec.

## 2016-08-27 ENCOUNTER — Telehealth: Payer: Self-pay | Admitting: *Deleted

## 2016-08-27 NOTE — Telephone Encounter (Signed)
Advised per VO Dr Rogue Bussing that he does not need iron infusion. I had to leave a message on his VM regarding this

## 2016-08-27 NOTE — Telephone Encounter (Signed)
States he is awaiting return call from [place], Please return his call. SE:[phone]

## 2016-08-31 DIAGNOSIS — R0602 Shortness of breath: Secondary | ICD-10-CM | POA: Diagnosis not present

## 2016-08-31 DIAGNOSIS — C9290 Myeloid leukemia, unspecified, not having achieved remission: Secondary | ICD-10-CM | POA: Diagnosis not present

## 2016-08-31 DIAGNOSIS — J449 Chronic obstructive pulmonary disease, unspecified: Secondary | ICD-10-CM | POA: Diagnosis not present

## 2016-08-31 DIAGNOSIS — R05 Cough: Secondary | ICD-10-CM | POA: Diagnosis not present

## 2016-09-07 DIAGNOSIS — I4891 Unspecified atrial fibrillation: Secondary | ICD-10-CM | POA: Diagnosis not present

## 2016-09-07 DIAGNOSIS — M069 Rheumatoid arthritis, unspecified: Secondary | ICD-10-CM | POA: Diagnosis not present

## 2016-09-07 DIAGNOSIS — H02831 Dermatochalasis of right upper eyelid: Secondary | ICD-10-CM | POA: Diagnosis not present

## 2016-09-07 DIAGNOSIS — Z79899 Other long term (current) drug therapy: Secondary | ICD-10-CM | POA: Diagnosis not present

## 2016-09-07 DIAGNOSIS — H02403 Unspecified ptosis of bilateral eyelids: Secondary | ICD-10-CM | POA: Diagnosis not present

## 2016-09-07 DIAGNOSIS — H3530 Unspecified macular degeneration: Secondary | ICD-10-CM | POA: Diagnosis not present

## 2016-09-07 DIAGNOSIS — Z87891 Personal history of nicotine dependence: Secondary | ICD-10-CM | POA: Diagnosis not present

## 2016-09-07 DIAGNOSIS — Z7951 Long term (current) use of inhaled steroids: Secondary | ICD-10-CM | POA: Diagnosis not present

## 2016-09-07 DIAGNOSIS — H04123 Dry eye syndrome of bilateral lacrimal glands: Secondary | ICD-10-CM | POA: Diagnosis not present

## 2016-09-07 DIAGNOSIS — J449 Chronic obstructive pulmonary disease, unspecified: Secondary | ICD-10-CM | POA: Diagnosis not present

## 2016-09-07 DIAGNOSIS — H02834 Dermatochalasis of left upper eyelid: Secondary | ICD-10-CM | POA: Diagnosis not present

## 2016-09-07 DIAGNOSIS — H538 Other visual disturbances: Secondary | ICD-10-CM | POA: Diagnosis not present

## 2016-09-07 DIAGNOSIS — Z7901 Long term (current) use of anticoagulants: Secondary | ICD-10-CM | POA: Diagnosis not present

## 2016-09-07 DIAGNOSIS — I509 Heart failure, unspecified: Secondary | ICD-10-CM | POA: Diagnosis not present

## 2016-09-07 DIAGNOSIS — C9110 Chronic lymphocytic leukemia of B-cell type not having achieved remission: Secondary | ICD-10-CM | POA: Diagnosis not present

## 2016-09-25 DIAGNOSIS — J189 Pneumonia, unspecified organism: Secondary | ICD-10-CM | POA: Diagnosis not present

## 2016-09-29 DIAGNOSIS — I480 Paroxysmal atrial fibrillation: Secondary | ICD-10-CM | POA: Diagnosis not present

## 2016-10-05 ENCOUNTER — Inpatient Hospital Stay
Admission: EM | Admit: 2016-10-05 | Discharge: 2016-10-07 | DRG: 871 | Disposition: A | Discharge status: Home / Self Care | Payer: Medicare Other | Attending: Internal Medicine | Admitting: Internal Medicine

## 2016-10-05 ENCOUNTER — Emergency Department: Payer: Medicare Other

## 2016-10-05 ENCOUNTER — Encounter: Payer: Self-pay | Admitting: Emergency Medicine

## 2016-10-05 DIAGNOSIS — R4182 Altered mental status, unspecified: Secondary | ICD-10-CM | POA: Diagnosis not present

## 2016-10-05 DIAGNOSIS — A419 Sepsis, unspecified organism: Secondary | ICD-10-CM | POA: Diagnosis present

## 2016-10-05 DIAGNOSIS — Z79899 Other long term (current) drug therapy: Secondary | ICD-10-CM

## 2016-10-05 DIAGNOSIS — J189 Pneumonia, unspecified organism: Secondary | ICD-10-CM | POA: Diagnosis present

## 2016-10-05 DIAGNOSIS — E860 Dehydration: Secondary | ICD-10-CM | POA: Diagnosis present

## 2016-10-05 DIAGNOSIS — Z87891 Personal history of nicotine dependence: Secondary | ICD-10-CM | POA: Diagnosis not present

## 2016-10-05 DIAGNOSIS — I959 Hypotension, unspecified: Secondary | ICD-10-CM | POA: Diagnosis not present

## 2016-10-05 DIAGNOSIS — E785 Hyperlipidemia, unspecified: Secondary | ICD-10-CM | POA: Diagnosis present

## 2016-10-05 DIAGNOSIS — G92 Toxic encephalopathy: Secondary | ICD-10-CM | POA: Diagnosis not present

## 2016-10-05 DIAGNOSIS — D729 Disorder of white blood cells, unspecified: Secondary | ICD-10-CM | POA: Diagnosis not present

## 2016-10-05 DIAGNOSIS — I110 Hypertensive heart disease with heart failure: Secondary | ICD-10-CM | POA: Diagnosis present

## 2016-10-05 DIAGNOSIS — S199XXA Unspecified injury of neck, initial encounter: Secondary | ICD-10-CM | POA: Diagnosis not present

## 2016-10-05 DIAGNOSIS — D72829 Elevated white blood cell count, unspecified: Secondary | ICD-10-CM | POA: Diagnosis not present

## 2016-10-05 DIAGNOSIS — Z7901 Long term (current) use of anticoagulants: Secondary | ICD-10-CM

## 2016-10-05 DIAGNOSIS — J181 Lobar pneumonia, unspecified organism: Secondary | ICD-10-CM

## 2016-10-05 DIAGNOSIS — K219 Gastro-esophageal reflux disease without esophagitis: Secondary | ICD-10-CM | POA: Diagnosis not present

## 2016-10-05 DIAGNOSIS — M1990 Unspecified osteoarthritis, unspecified site: Secondary | ICD-10-CM | POA: Diagnosis present

## 2016-10-05 DIAGNOSIS — J9601 Acute respiratory failure with hypoxia: Secondary | ICD-10-CM | POA: Diagnosis present

## 2016-10-05 DIAGNOSIS — J440 Chronic obstructive pulmonary disease with acute lower respiratory infection: Secondary | ICD-10-CM | POA: Diagnosis present

## 2016-10-05 DIAGNOSIS — Z8249 Family history of ischemic heart disease and other diseases of the circulatory system: Secondary | ICD-10-CM | POA: Diagnosis not present

## 2016-10-05 DIAGNOSIS — S0990XA Unspecified injury of head, initial encounter: Secondary | ICD-10-CM | POA: Diagnosis not present

## 2016-10-05 DIAGNOSIS — I4891 Unspecified atrial fibrillation: Secondary | ICD-10-CM | POA: Diagnosis present

## 2016-10-05 DIAGNOSIS — Z8546 Personal history of malignant neoplasm of prostate: Secondary | ICD-10-CM

## 2016-10-05 DIAGNOSIS — R509 Fever, unspecified: Secondary | ICD-10-CM | POA: Diagnosis not present

## 2016-10-05 LAB — COMPREHENSIVE METABOLIC PANEL
ALBUMIN: 3.4 g/dL — AB (ref 3.5–5.0)
ALK PHOS: 84 U/L (ref 38–126)
ALT: 13 U/L — AB (ref 17–63)
ANION GAP: 8 (ref 5–15)
AST: 30 U/L (ref 15–41)
BUN: 23 mg/dL — ABNORMAL HIGH (ref 6–20)
CALCIUM: 8.2 mg/dL — AB (ref 8.9–10.3)
CHLORIDE: 109 mmol/L (ref 101–111)
CO2: 22 mmol/L (ref 22–32)
CREATININE: 1.01 mg/dL (ref 0.61–1.24)
GFR calc Af Amer: >60 mL/min (ref >60)
GFR calc non Af Amer: >60 mL/min (ref >60)
GLUCOSE: 107 mg/dL — AB (ref 65–99)
Potassium: 3.4 mmol/L — ABNORMAL LOW (ref 3.5–5.1)
Sodium: 139 mmol/L (ref 135–145)
Total Bilirubin: 0.9 mg/dL (ref 0.3–1.2)
Total Protein: 6.8 g/dL (ref 6.5–8.1)

## 2016-10-05 LAB — URINALYSIS, COMPLETE (UACMP) WITH MICROSCOPIC
BACTERIA UA: NONE SEEN
BILIRUBIN URINE: NEGATIVE
Glucose, UA: NEGATIVE mg/dL
KETONES UR: NEGATIVE mg/dL
LEUKOCYTES UA: NEGATIVE
NITRITE: NEGATIVE
PROTEIN: NEGATIVE mg/dL
Specific Gravity, Urine: 1.017 (ref 1.005–1.030)
pH: 5 (ref 5.0–8.0)

## 2016-10-05 LAB — CBC WITH DIFFERENTIAL/PLATELET
BASOS PCT: 0 %
Basophils Absolute: 0 10*3/uL (ref 0–0.1)
EOS ABS: 0 10*3/uL (ref 0–0.7)
EOS PCT: 0 %
HCT: 33.3 % — ABNORMAL LOW (ref 40.0–52.0)
HEMOGLOBIN: 11 g/dL — AB (ref 13.0–18.0)
LYMPHS PCT: 2 %
Lymphs Abs: 0.9 10*3/uL — ABNORMAL LOW (ref 1.0–3.6)
MCH: 30.4 pg (ref 26.0–34.0)
MCHC: 32.9 g/dL (ref 32.0–36.0)
MCV: 92.6 fL (ref 80.0–100.0)
Monocytes Absolute: 7.5 10*3/uL — ABNORMAL HIGH (ref 0.2–1.0)
Monocytes Relative: 17 %
NEUTROS ABS: 35.5 10*3/uL — AB (ref 1.4–6.5)
NEUTROS PCT: 81 %
Platelets: 219 10*3/uL (ref 150–440)
RBC: 3.6 MIL/uL — ABNORMAL LOW (ref 4.40–5.90)
RDW: 17.8 % — ABNORMAL HIGH (ref 11.5–14.5)
WBC: 43.9 10*3/uL — ABNORMAL HIGH (ref 3.8–10.6)

## 2016-10-05 LAB — LACTIC ACID, PLASMA
Lactic Acid, Venous: 2.5 mmol/L (ref 0.5–1.9)
Lactic Acid, Venous: 2.6 mmol/L (ref 0.5–1.9)

## 2016-10-05 LAB — CK: CK TOTAL: 30 U/L — AB (ref 49–397)

## 2016-10-05 LAB — PROTIME-INR
INR: 2.58
Prothrombin Time: 28.2 seconds — ABNORMAL HIGH (ref 11.4–15.2)

## 2016-10-05 LAB — TROPONIN I

## 2016-10-05 MED ORDER — CEFTRIAXONE SODIUM-DEXTROSE 1-3.74 GM-% IV SOLR
1.0000 g | INTRAVENOUS | Status: DC
  Administered 2016-10-06 – 2016-10-07 (×2): 1 g via INTRAVENOUS
  Filled 2016-10-05 (×2): qty 50

## 2016-10-05 MED ORDER — MONTELUKAST SODIUM 10 MG PO TABS
10.0000 mg | ORAL_TABLET | Freq: Every evening | ORAL | Status: DC
  Administered 2016-10-05 – 2016-10-06 (×2): 10 mg via ORAL
  Filled 2016-10-05 (×2): qty 1

## 2016-10-05 MED ORDER — CEFTRIAXONE SODIUM-DEXTROSE 1-3.74 GM-% IV SOLR
1.0000 g | Freq: Once | INTRAVENOUS | Status: AC
  Administered 2016-10-05: 1 g via INTRAVENOUS
  Filled 2016-10-05: qty 50

## 2016-10-05 MED ORDER — ALBUTEROL SULFATE (2.5 MG/3ML) 0.083% IN NEBU: 2.5000 mg | INHALATION_SOLUTION | RESPIRATORY_TRACT | Status: DC | PRN

## 2016-10-05 MED ORDER — IBUPROFEN 400 MG PO TABS: 400.0000 mg | ORAL_TABLET | Freq: Four times a day (QID) | ORAL | Status: DC | PRN

## 2016-10-05 MED ORDER — SODIUM CHLORIDE 0.9% FLUSH
3.0000 mL | Freq: Two times a day (BID) | INTRAVENOUS | Status: DC
  Administered 2016-10-05 – 2016-10-07 (×4): 3 mL via INTRAVENOUS

## 2016-10-05 MED ORDER — POLYETHYLENE GLYCOL 3350 17 G PO PACK: 17.0000 g | PACK | Freq: Every day | ORAL | Status: DC | PRN

## 2016-10-05 MED ORDER — DEXTROSE 5 % IV SOLN: 1.0000 g | Freq: Once | INTRAVENOUS | Status: DC

## 2016-10-05 MED ORDER — DIGOXIN 125 MCG PO TABS
0.1250 mg | ORAL_TABLET | Freq: Every day | ORAL | Status: DC
  Administered 2016-10-05 – 2016-10-07 (×3): 0.125 mg via ORAL
  Filled 2016-10-05 (×3): qty 1

## 2016-10-05 MED ORDER — SODIUM CHLORIDE 0.9 % IV BOLUS (SEPSIS)
1000.0000 mL | Freq: Once | INTRAVENOUS | Status: AC
  Administered 2016-10-05: 1000 mL via INTRAVENOUS

## 2016-10-05 MED ORDER — FOLIC ACID 1 MG PO TABS
1.0000 mg | ORAL_TABLET | Freq: Every day | ORAL | Status: DC
  Administered 2016-10-05 – 2016-10-07 (×3): 1 mg via ORAL
  Filled 2016-10-05 (×3): qty 1

## 2016-10-05 MED ORDER — WARFARIN SODIUM 4 MG PO TABS
4.0000 mg | ORAL_TABLET | Freq: Every day | ORAL | Status: DC
  Administered 2016-10-05 – 2016-10-06 (×2): 4 mg via ORAL
  Filled 2016-10-05 (×2): qty 1

## 2016-10-05 MED ORDER — ACETAMINOPHEN 325 MG PO TABS
650.0000 mg | ORAL_TABLET | Freq: Four times a day (QID) | ORAL | Status: DC | PRN
  Administered 2016-10-05 – 2016-10-06 (×2): 650 mg via ORAL
  Filled 2016-10-05 (×2): qty 2

## 2016-10-05 MED ORDER — ACETAMINOPHEN 325 MG PO TABS
650.0000 mg | ORAL_TABLET | Freq: Once | ORAL | Status: AC
Start: 2016-10-05 — End: 2016-10-05
  Administered 2016-10-05: 650 mg via ORAL
  Filled 2016-10-05: qty 2

## 2016-10-05 MED ORDER — METOPROLOL TARTRATE 5 MG/5ML IV SOLN: 5.0000 mg | INTRAVENOUS | Status: DC | PRN

## 2016-10-05 MED ORDER — WARFARIN - PHYSICIAN DOSING INPATIENT
Freq: Every day | Status: DC
  Administered 2016-10-05 – 2016-10-06 (×2)

## 2016-10-05 MED ORDER — ONDANSETRON HCL 4 MG PO TABS: 4.0000 mg | ORAL_TABLET | Freq: Four times a day (QID) | ORAL | Status: DC | PRN

## 2016-10-05 MED ORDER — AZITHROMYCIN 500 MG PO TABS
500.0000 mg | ORAL_TABLET | Freq: Every day | ORAL | Status: DC
  Administered 2016-10-06 – 2016-10-07 (×2): 500 mg via ORAL
  Filled 2016-10-05 (×2): qty 1

## 2016-10-05 MED ORDER — DEXTROSE 5 % IV SOLN
500.0000 mg | Freq: Once | INTRAVENOUS | Status: AC
  Administered 2016-10-05: 500 mg via INTRAVENOUS
  Filled 2016-10-05: qty 500

## 2016-10-05 MED ORDER — POTASSIUM CHLORIDE IN NACL 20-0.9 MEQ/L-% IV SOLN
INTRAVENOUS | Status: AC
  Administered 2016-10-05: 13:00:00 via INTRAVENOUS
  Filled 2016-10-05: qty 1000

## 2016-10-05 MED ORDER — ACETAMINOPHEN 650 MG RE SUPP: 650.0000 mg | Freq: Four times a day (QID) | RECTAL | Status: DC | PRN

## 2016-10-05 MED ORDER — FLUTICASONE FUROATE-VILANTEROL 100-25 MCG/INH IN AEPB
1.0000 | INHALATION_SPRAY | RESPIRATORY_TRACT | Status: DC
  Administered 2016-10-06 – 2016-10-07 (×2): 1 via RESPIRATORY_TRACT
  Filled 2016-10-05: qty 28

## 2016-10-05 MED ORDER — ONDANSETRON HCL 4 MG/2ML IJ SOLN: 4.0000 mg | Freq: Four times a day (QID) | INTRAMUSCULAR | Status: DC | PRN

## 2016-10-05 MED ORDER — FERROUS SULFATE 325 (65 FE) MG PO TABS
325.0000 mg | ORAL_TABLET | Freq: Every morning | ORAL | Status: DC
  Administered 2016-10-05 – 2016-10-07 (×3): 325 mg via ORAL
  Filled 2016-10-05 (×3): qty 1

## 2016-10-05 NOTE — Progress Notes (Signed)
The Pt's daughter requested for AD directives for her father who is a Pt in [id number]. [place] met Pt, provided education for Ad, but Pt was tired and asked [place] visit the next day. Pt said he had not slept for 2 days. Note: [facility] to visit Pt tomorrow to provide AD further AD instructions.     10/05/16 1500  Clinical Encounter Type  Visited With Patient;Family  Visit Type Initial;Spiritual support;Other (Comment)  Referral From Family  Consult/Referral To [place];Other (Comment)  Stress Factors  Patient Stress Factors Exhausted  Family Stress Factors None identified

## 2016-10-05 NOTE — ED Triage Notes (Signed)
Per EMS pt fell at home. Has had fever and been altered. 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 100 F temp per EMS. In afib with EMS

## 2016-10-05 NOTE — H&P (Signed)
[facility] [place] NAME: Thomas Townsend    MR#:  [id number]  DATE OF BIRTH:  [birth year]  DATE OF ADMISSION:  10/05/2016  PRIMARY CARE PHYSICIAN: Allyne Gee, MD   REQUESTING/REFERRING PHYSICIAN: Dr. Darl Householder  CHIEF COMPLAINT:   Chief Complaint  Patient presents with  . Altered Mental Status    HISTORY OF PRESENT ILLNESS:  Thomas Townsend  is a 83 y.o. male with a known history of Atrial fibrillation, leukocytosis, prostate cancer presents to the emergency room brought in by EMS after a neighbor found him alternate. Patient felt well yesterday. He woke up at night feeling extremely cold and increase the thermostat level. Had 4 blankets. Today morning [place] show up [place] the farm and one of his neighbors found him at home altered. He was found to have temperature 100.4 with atrial fibrillation and rapid ventricular rate. Chest x-ray showing right upper lobe pneumonia. WBC count of 43,000. Baseline is close to 35,000.  Patient is more oriented at this time.  PAST MEDICAL HISTORY:   Past Medical History:  Diagnosis Date  . Arthritis   . Atrial fibrillation ([place])   . CHF (congestive heart failure) ([place])   . COPD (chronic obstructive pulmonary disease) ([place])   . Dysrhythmia   . GERD (gastroesophageal reflux disease)   . Hyperlipemia   . Hypertension   . Leukocytosis 01/14/2016  . Prostate cancer ([place])     PAST SURGICAL HISTORY:   Past Surgical History:  Procedure Laterality Date  . CHOLECYSTECTOMY    . COLONOSCOPY WITH PROPOFOL N/A 04/04/2015   Procedure: COLONOSCOPY WITH PROPOFOL;  Surgeon: Manya Silvas, MD;  [facility];  Service: Endoscopy;  Laterality: N/A;  . ESOPHAGOGASTRODUODENOSCOPY N/A 04/04/2015   Procedure: ESOPHAGOGASTRODUODENOSCOPY (EGD);  Surgeon: Manya Silvas, MD;  Location: [facility] ENDOSCOPY;  Service: Endoscopy;  Laterality: N/A;    SOCIAL HISTORY:   Social History  Substance Use Topics  . Smoking  status: Former Smoker    Years: 20.00    Types: Cigarettes  . Smokeless tobacco: Never Used  . Alcohol use 0.0 oz/week     Comment: Wine every once in a while    FAMILY HISTORY:   Family History  Problem Relation Age of Onset  . Hypertension Other     DRUG ALLERGIES:  No Known Allergies  REVIEW OF SYSTEMS:   Review of Systems  Constitutional: Positive for chills, fever and malaise/fatigue. Negative for weight loss.  HENT: Negative for hearing loss and nosebleeds.   Eyes: Negative for blurred vision, double vision and pain.  Respiratory: Positive for cough and shortness of breath. Negative for hemoptysis, sputum production and wheezing.   Cardiovascular: Negative for chest pain, palpitations, orthopnea and leg swelling.  Gastrointestinal: Negative for abdominal pain, constipation, diarrhea, nausea and vomiting.  Genitourinary: Negative for dysuria and hematuria.  Musculoskeletal: Negative for back pain, falls and myalgias.  Skin: Negative for rash.  Neurological: Positive for weakness. Negative for dizziness, tremors, sensory change, speech change, focal weakness, seizures and headaches.  Endo/Heme/Allergies: Does not bruise/bleed easily.  Psychiatric/Behavioral: Negative for depression and memory loss. The patient is not nervous/anxious.     MEDICATIONS AT HOME:   Prior to Admission medications   Medication Sig Start Date End Date Taking? Authorizing Provider  BREO ELLIPTA 100-25 MCG/INH AEPB Inhale 1 puff into the lungs every morning. 01/28/15   Historical Provider, MD  digoxin (LANOXIN) 0.125 MG tablet Take 0.125 mg by mouth every morning.  02/22/14   Historical Provider, MD  ferrous sulfate 325 (65 FE) MG tablet Take 325 mg by mouth every morning. Reported on 02/25/2016    Historical Provider, MD  folic acid (FOLVITE) 1 MG tablet Take 1 mg by mouth daily.     Historical Provider, MD  furosemide (LASIX) 20 MG tablet Take 20 mg by mouth every other day. In the morning     Historical Provider, MD  ibuprofen (ADVIL) 200 MG tablet Take 400 mg by mouth every morning.    Historical Provider, MD  methotrexate (RHEUMATREX) 2.5 MG tablet TAKE 6 TABLETS (15 MG TOTAL) BY MOUTH EVERY 7 (SEVEN) DAYS. 12/24/15   Historical Provider, MD  montelukast (SINGULAIR) 10 MG tablet Take 1 tablet by mouth every morning.     Historical Provider, MD  Multiple Vitamins-Minerals (PRESERVISION AREDS PO) Take 1 tablet by mouth every morning.     Historical Provider, MD  predniSONE (STERAPRED UNI-PAK 21 TAB) 10 MG (21) TBPK tablet 40mg  x1 day, 20mg  x2 day, 10mg  x2 day then stop 06/26/16   Lytle But[place], MD  warfarin (COUMADIN) 4 MG tablet Take 4 mg by mouth every morning.     Historical Provider, MD     VITAL SIGNS:  Blood pressure 108/87, pulse (!) 107, temperature (!) 100.4 F (38 C), temperature source Rectal, resp. rate (!) 22, weight 65.8 kg (145 lb), SpO2 100 %.  PHYSICAL EXAMINATION:  Physical Exam  GENERAL:  83 y.o.-year-old patient lying in the bed. Looks critically ill. Decreased hearing EYES: Pupils equal, round, reactive to light and accommodation. No scleral icterus. Extraocular muscles intact.  HEENT: Head atraumatic, normocephalic. Oropharynx and nasopharynx clear. No oropharyngeal erythema, moist oral mucosa  NECK:  Supple, no jugular venous distention. No thyroid enlargement, no tenderness.  LUNGS: Bilateral rhonchi. Coarse breath sounds per CARDIOVASCULAR: Irregularly irregular and tachycardic ABDOMEN: Soft, nontender, nondistended. Bowel sounds present. No organomegaly or mass.  EXTREMITIES: No pedal edema, cyanosis, or clubbing. + 2 pedal & radial pulses b/l.   NEUROLOGIC: Cranial nerves II through XII are intact. No focal Motor or sensory deficits appreciated b/l PSYCHIATRIC: The patient is alert and oriented x 3. Good affect.  SKIN: No obvious rash, lesion, or ulcer.   LABORATORY PANEL:   CBC  Recent Labs Lab 10/05/16 0940  WBC 43.9*  HGB 11.0*  HCT 33.3*   PLT 219   ------------------------------------------------------------------------------------------------------------------  Chemistries   Recent Labs Lab 10/05/16 0940  NA 139  K 3.4*  CL 109  CO2 22  GLUCOSE 107*  BUN 23*  CREATININE 1.01  CALCIUM 8.2*  AST 30  ALT 13*  ALKPHOS 84  BILITOT 0.9   ------------------------------------------------------------------------------------------------------------------  Cardiac Enzymes  Recent Labs Lab 10/05/16 0940  TROPONINI <0.03   ------------------------------------------------------------------------------------------------------------------  RADIOLOGY:  Dg Chest 2 View  Result Date: 10/05/2016 CLINICAL DATA:  Fever and atrial fibrillation.  Fall today. EXAM: CHEST  2 VIEW COMPARISON:  PA and lateral chest 07/13/2016.  CT chest 06/24/2016. FINDINGS: The lungs are markedly emphysematous. There is extensive right upper lobe airspace disease. Left lung is clear. No pneumothorax or pleural effusion. No acute bony abnormality. Aortic atherosclerosis noted. IMPRESSION: Right upper lobe airspace disease most consistent with pneumonia. Recommend followup to clearing. Emphysema. Atherosclerosis. Electronically Signed   By: Inge Rise M.D.   On: 10/05/2016 10:13   Ct Head Wo Contrast  Result Date: 10/05/2016 CLINICAL DATA:  Fall at home. Fever and altered mental status. Initial encounter. EXAM: CT HEAD WITHOUT CONTRAST CT CERVICAL SPINE  WITHOUT CONTRAST TECHNIQUE: Multidetector CT imaging of the head and cervical spine was performed following the standard protocol without intravenous contrast. Multiplanar CT image reconstructions of the cervical spine were also generated. COMPARISON:  06/20/2016 head CT FINDINGS: CT HEAD FINDINGS Brain: No evidence of acute infarction, hemorrhage, hydrocephalus, extra-axial collection or mass lesion/mass effect. Mild-to-moderate generalized cerebral volume loss. Mild microvascular change in the  periventricular white matter. Small remote cortical infarct in the right occipital lobe. Vascular: Atherosclerotic calcification.  No hyperdense vessel Skull: No acute or aggressive finding Sinuses/Orbits: Bilateral cataract resection.  No acute finding CT CERVICAL SPINE FINDINGS Alignment: No suspected traumatic malalignment. There is dextroscoliosis and C4-5 anterolisthesis that appears facet mediated. Skull base and vertebrae: Negative for fracture Soft tissues and spinal canal: No prevertebral fluid or swelling. No visible canal hematoma. Disc levels: Severe facet arthropathy on the left, especially from C2-3 to C4-5. There is associated anterolisthesis and scoliosis. Diffuse degenerative disc narrowing. Upper chest: Emphysema. Linear opacities at the apices have a scar like morphology. IMPRESSION: 1. No evidence of acute intracranial or cervical spine injury. 2. Senescent changes and small remote right occipital infarct. 3. Advanced cervical facet arthropathy with listhesis and scoliosis. 4. Emphysema. Electronically Signed   By: Monte Fantasia M.D.   On: 10/05/2016 10:19   Ct Cervical Spine Wo Contrast  Result Date: 10/05/2016 CLINICAL DATA:  Fall at home. Fever and altered mental status. Initial encounter. EXAM: CT HEAD WITHOUT CONTRAST CT CERVICAL SPINE WITHOUT CONTRAST TECHNIQUE: Multidetector CT imaging of the head and cervical spine was performed following the standard protocol without intravenous contrast. Multiplanar CT image reconstructions of the cervical spine were also generated. COMPARISON:  06/20/2016 head CT FINDINGS: CT HEAD FINDINGS Brain: No evidence of acute infarction, hemorrhage, hydrocephalus, extra-axial collection or mass lesion/mass effect. Mild-to-moderate generalized cerebral volume loss. Mild microvascular change in the periventricular white matter. Small remote cortical infarct in the right occipital lobe. Vascular: Atherosclerotic calcification.  No hyperdense vessel Skull: No  acute or aggressive finding Sinuses/Orbits: Bilateral cataract resection.  No acute finding CT CERVICAL SPINE FINDINGS Alignment: No suspected traumatic malalignment. There is dextroscoliosis and C4-5 anterolisthesis that appears facet mediated. Skull base and vertebrae: Negative for fracture Soft tissues and spinal canal: No prevertebral fluid or swelling. No visible canal hematoma. Disc levels: Severe facet arthropathy on the left, especially from C2-3 to C4-5. There is associated anterolisthesis and scoliosis. Diffuse degenerative disc narrowing. Upper chest: Emphysema. Linear opacities at the apices have a scar like morphology. IMPRESSION: 1. No evidence of acute intracranial or cervical spine injury. 2. Senescent changes and small remote right occipital infarct. 3. Advanced cervical facet arthropathy with listhesis and scoliosis. 4. Emphysema. Electronically Signed   By: Monte Fantasia M.D.   On: 10/05/2016 10:19     IMPRESSION AND PLAN:   * Right upper lobe pneumonia with sepsis Fluid resuscitation with sepsis protocol. Coumadin quite pneumonia. Start IV ceftriaxone and azithromycin. Wean oxygen as tolerated. Nebulizers as needed. Severe leukocytosis is worsened from his previous leukocytosis which is baseline.  * Atrial fibrillation with rapid ventricular rate due to sepsis. Improving with IV fluids. Monitor. We'll give him home dose of digoxin orally. Add IV Lopressor as needed. Admit to telemetry unit.  * DVT prophylaxis. Patient is on warfarin.  All the records are reviewed and case discussed with ED provider. Management plans discussed with the patient, family and they are in agreement.  CODE STATUS: FULL CODE  TOTAL TIME TAKING CARE OF THIS PATIENT: 40 minutes.  Hillary Bow R M.D on 10/05/2016 at 10:52 AM  Between 7am to 6pm - Pager - [phone]  After 6pm go to [email or web address] - password [email or web address] [facility]  [facility]  Office  [phone]  CC: Primary care  physician; Allyne Gee, MD  Note: This dictation was prepared with Dragon dictation along with smaller phrase technology. Any transcriptional errors that result from this process are unintentional.

## 2016-10-05 NOTE — ED Provider Notes (Signed)
[facility] Provider Note   CSN: [id number] Arrival date & time: 10/05/16  [id number]     History   Chief Complaint Chief Complaint  Patient presents with  . Altered Mental Status    HPI Thomas Townsend is a 83 y.o. male hx of afib on coumadin, Hypertension, hyperlipidemia, prostate cancer here presenting with weakness, altered mental status. 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 A friend came to check up on him and noticed that he was on the floor altered. Patient did not know how he got on the floor. He states that he felt warm yesterday and had some nonproductive cough. EMS got there and he was ambulatory on the scene with assistance. Patient was noted to be confused initially by EMS. He was noted to be in rapid afib rate 130s and hypotensive BP 90s as per EMS. Given 500 cc bolus en route.   The history is provided by the patient and the EMS personnel. The history is limited by the condition of the patient.   Level V caveat- AMS   Past Medical History:  Diagnosis Date  . Arthritis   . Atrial fibrillation ([place])   . CHF (congestive heart failure) ([place])   . COPD (chronic obstructive pulmonary disease) ([place])   . Dysrhythmia   . GERD (gastroesophageal reflux disease)   . Hyperlipemia   . Hypertension   . Leukocytosis 01/14/2016  . Prostate cancer [facility])     Patient Active Problem List   Diagnosis Date Noted  . Chronic neutrophilia 08/26/2016  . Iron deficiency anemia due to chronic blood loss 08/26/2016  . Myeloproliferative neoplasm ([place]) 06/25/2016  . Anemia due to other cause 06/25/2016  . Diarrhea 02/26/2016  . Leukocytosis 01/14/2016  . Septic shock ([place]) 08/11/2015  . Acidosis, lactic 08/11/2015  . Acute respiratory failure with hypoxia ([place]) 08/11/2015  . Encephalopathy, metabolic [id number]  . Pneumonia 08/11/2015  . Pulmonary hypertension 08/11/2015  . Mild aortic stenosis 08/11/2015  . Generalized weakness  08/11/2015  . Anemia of chronic disease 08/11/2015  . Sepsis ([place]) 08/06/2015  . Atrial fibrillation with rapid ventricular response ([place]) 08/06/2015  . CAP (community acquired pneumonia) 08/06/2015  . Rheumatoid arthritis involving multiple joints ([place]) 01/28/2015  . Arthritis or polyarthritis, rheumatoid ([place]) 01/28/2015  . A-fib ([place]) 03/04/2014  . CAFL (chronic airflow limitation) ([place]) 03/04/2014  . Acid reflux 03/04/2014  . Diastolic dysfunction [id number]  . Atrial fibrillation ([place]) 03/04/2014  . Chronic obstructive pulmonary disease ([place]) 03/04/2014    Past Surgical History:  Procedure Laterality Date  . CHOLECYSTECTOMY    . COLONOSCOPY WITH PROPOFOL N/A 04/04/2015   Procedure: COLONOSCOPY WITH PROPOFOL;  Surgeon: Manya Silvas, MD;  Location[facility] ENDOSCOPY;  Service: Endoscopy;  Laterality: N/A;  . ESOPHAGOGASTRODUODENOSCOPY N/A 04/04/2015   Procedure: ESOPHAGOGASTRODUODENOSCOPY (EGD);  Surgeon: Manya Silvas, MD;  Location: [facility] ENDOSCOPY;  Service: Endoscopy;  Laterality: N/A;       Home Medications    Prior to Admission medications   Medication Sig Start Date End Date Taking? Authorizing Provider  BREO ELLIPTA 100-25 MCG/INH AEPB Inhale 1 puff into the lungs every morning. 01/28/15   Historical Provider, MD  digoxin (LANOXIN) 0.125 MG tablet Take 0.125 mg by mouth every morning.  02/22/14   Historical Provider, MD  ferrous sulfate 325 (65 FE) MG tablet Take 325 mg by mouth every morning. Reported on 02/25/2016    Historical Provider, MD  folic acid (FOLVITE) 1  MG tablet Take 1 mg by mouth daily.     Historical Provider, MD  furosemide (LASIX) 20 MG tablet Take 20 mg by mouth every other day. In the morning    Historical Provider, MD  ibuprofen (ADVIL) 200 MG tablet Take 400 mg by mouth every morning.    Historical Provider, MD  methotrexate (RHEUMATREX) 2.5 MG tablet TAKE 6 TABLETS (15 MG TOTAL) BY MOUTH EVERY 7 (SEVEN) DAYS. 12/24/15   Historical Provider, MD    montelukast (SINGULAIR) 10 MG tablet Take 1 tablet by mouth every morning.     Historical Provider, MD  Multiple Vitamins-Minerals (PRESERVISION AREDS PO) Take 1 tablet by mouth every morning.     Historical Provider, MD  predniSONE (STERAPRED UNI-PAK 21 TAB) 10 MG (21) TBPK tablet 40mg  x1 day, 20mg  x2 day, 10mg  x2 day then stop 06/26/16   Lytle Butte, MD  warfarin (COUMADIN) 4 MG tablet Take 4 mg by mouth every morning.     Historical Provider, MD    Family History Family History  Problem Relation Age of Onset  . Hypertension Other     Social History Social History  Substance Use Topics  . Smoking status: Former Smoker    Years: 20.00    Types: Cigarettes  . Smokeless tobacco: Never Used  . Alcohol use 0.0 oz/week     Comment: Wine every once in a while     Allergies   Patient has no known allergies.   Review of Systems Review of Systems  Unable to perform ROS: Mental status change  All other systems reviewed and are negative.    Physical Exam Updated Vital Signs BP 108/87   Pulse (!) 107   Temp (!) 100.4 F (38 C) (Rectal)   Resp (!) 22   Wt 145 lb (65.8 kg)   SpO2 100%   BMI 22.71 kg/m   Physical Exam  Constitutional: He is oriented to person, place, and time.  Chronically ill, mildly dehydrated   HENT:  Head: Normocephalic.  MM slightly dry   Eyes: EOM are normal. Pupils are equal, round, and reactive to light.  Neck: Normal range of motion. Neck supple.  Cardiovascular: Normal heart sounds.   Slightly tachycardic, irregular   Pulmonary/Chest: Effort normal. He has no rales.  Slightly tachypneic, diminished R base   Abdominal: Soft. Bowel sounds are normal. He exhibits no distension. There is no tenderness. There is no guarding.  Musculoskeletal: Normal range of motion.  Neurological: He is alert and oriented to person, place, and time. He displays normal reflexes. No cranial nerve deficit. Coordination normal.  Skin: Skin is warm.  Psychiatric:  He has a normal mood and affect.  Nursing note and vitals reviewed.    ED Treatments / Results  Labs (all labs ordered are listed, but only abnormal results are displayed) Labs Reviewed  COMPREHENSIVE METABOLIC PANEL - Abnormal; Notable for the following:       Result Value   Potassium 3.4 (*)    Glucose, Bld 107 (*)    BUN 23 (*)    Calcium 8.2 (*)    Albumin 3.4 (*)    ALT 13 (*)    All other components within normal limits  CBC WITH DIFFERENTIAL/PLATELET - Abnormal; Notable for the following:    WBC 43.9 (*)    RBC 3.60 (*)    Hemoglobin 11.0 (*)    HCT 33.3 (*)    RDW 17.8 (*)    All other components within normal limits  LACTIC ACID, PLASMA - Abnormal; Notable for the following:    Lactic Acid, Venous 2.5 (*)    All other components within normal limits  CK - Abnormal; Notable for the following:    Total CK 30 (*)    All other components within normal limits  CULTURE, BLOOD (ROUTINE X 2)  CULTURE, BLOOD (ROUTINE X 2)  URINE CULTURE  TROPONIN I  LACTIC ACID, PLASMA  URINALYSIS, COMPLETE (UACMP) WITH MICROSCOPIC  PROTIME-INR    EKG  EKG Interpretation None      ED ECG REPORT I, Wandra Arthurs, the attending physician, personally viewed and interpreted this ECG.   Date: 10/05/2016  EKG Time: 9:33 am  Rate: 98  Rhythm: atrial fibrillation, rate 98  Axis: normal  Intervals:none  ST&T Change: ST depression laterally    Radiology Dg Chest 2 View  Result Date: 10/05/2016 CLINICAL DATA:  Fever and atrial fibrillation.  Fall today. EXAM: CHEST  2 VIEW COMPARISON:  PA and lateral chest 07/13/2016.  CT chest 06/24/2016. FINDINGS: The lungs are markedly emphysematous. There is extensive right upper lobe airspace disease. Left lung is clear. No pneumothorax or pleural effusion. No acute bony abnormality. Aortic atherosclerosis noted. IMPRESSION: Right upper lobe airspace disease most consistent with pneumonia. Recommend followup to clearing. Emphysema. Atherosclerosis.  Electronically Signed   By: Inge Rise M.D.   On: 10/05/2016 10:13   Ct Head Wo Contrast  Result Date: 10/05/2016 CLINICAL DATA:  Fall at home. Fever and altered mental status. Initial encounter. EXAM: CT HEAD WITHOUT CONTRAST CT CERVICAL SPINE WITHOUT CONTRAST TECHNIQUE: Multidetector CT imaging of the head and cervical spine was performed following the standard protocol without intravenous contrast. Multiplanar CT image reconstructions of the cervical spine were also generated. COMPARISON:  06/20/2016 head CT FINDINGS: CT HEAD FINDINGS Brain: No evidence of acute infarction, hemorrhage, hydrocephalus, extra-axial collection or mass lesion/mass effect. Mild-to-moderate generalized cerebral volume loss. Mild microvascular change in the periventricular white matter. Small remote cortical infarct in the right occipital lobe. Vascular: Atherosclerotic calcification.  No hyperdense vessel Skull: No acute or aggressive finding Sinuses/Orbits: Bilateral cataract resection.  No acute finding CT CERVICAL SPINE FINDINGS Alignment: No suspected traumatic malalignment. There is dextroscoliosis and C4-5 anterolisthesis that appears facet mediated. Skull base and vertebrae: Negative for fracture Soft tissues and spinal canal: No prevertebral fluid or swelling. No visible canal hematoma. Disc levels: Severe facet arthropathy on the left, especially from C2-3 to C4-5. There is associated anterolisthesis and scoliosis. Diffuse degenerative disc narrowing. Upper chest: Emphysema. Linear opacities at the apices have a scar like morphology. IMPRESSION: 1. No evidence of acute intracranial or cervical spine injury. 2. Senescent changes and small remote right occipital infarct. 3. Advanced cervical facet arthropathy with listhesis and scoliosis. 4. Emphysema. Electronically Signed   By: Monte Fantasia M.D.   On: 10/05/2016 10:19   Ct Cervical Spine Wo Contrast  Result Date: 10/05/2016 CLINICAL DATA:  Fall at home. Fever and  altered mental status. Initial encounter. EXAM: CT HEAD WITHOUT CONTRAST CT CERVICAL SPINE WITHOUT CONTRAST TECHNIQUE: Multidetector CT imaging of the head and cervical spine was performed following the standard protocol without intravenous contrast. Multiplanar CT image reconstructions of the cervical spine were also generated. COMPARISON:  06/20/2016 head CT FINDINGS: CT HEAD FINDINGS Brain: No evidence of acute infarction, hemorrhage, hydrocephalus, extra-axial collection or mass lesion/mass effect. Mild-to-moderate generalized cerebral volume loss. Mild microvascular change in the periventricular white matter. Small remote cortical infarct in the right occipital lobe. Vascular: Atherosclerotic calcification.  No hyperdense vessel Skull: No acute or aggressive finding Sinuses/Orbits: Bilateral cataract resection.  No acute finding CT CERVICAL SPINE FINDINGS Alignment: No suspected traumatic malalignment. There is dextroscoliosis and C4-5 anterolisthesis that appears facet mediated. Skull base and vertebrae: Negative for fracture Soft tissues and spinal canal: No prevertebral fluid or swelling. No visible canal hematoma. Disc levels: Severe facet arthropathy on the left, especially from C2-3 to C4-5. There is associated anterolisthesis and scoliosis. Diffuse degenerative disc narrowing. Upper chest: Emphysema. Linear opacities at the apices have a scar like morphology. IMPRESSION: 1. No evidence of acute intracranial or cervical spine injury. 2. Senescent changes and small remote right occipital infarct. 3. Advanced cervical facet arthropathy with listhesis and scoliosis. 4. Emphysema. Electronically Signed   By: Monte Fantasia M.D.   On: 10/05/2016 10:19    Procedures Procedures (including critical care time)  Medications Ordered in ED Medications  sodium chloride 0.9 % bolus 1,000 mL (1,000 mLs Intravenous New Bag/Given 10/05/16 1009)    And  sodium chloride 0.9 % bolus 1,000 mL (1,000 mLs Intravenous  New Bag/Given 10/05/16 0945)  acetaminophen (TYLENOL) tablet 650 mg (not administered)  azithromycin (ZITHROMAX) 500 mg in dextrose 5 % 250 mL IVPB (not administered)  cefTRIAXone (ROCEPHIN) IVPB 1 g (not administered)     Initial Impression / Assessment and Plan / ED Course  I have reviewed the triage vital signs and the nursing notes.  Pertinent labs & imaging results that were available during my care of the patient were reviewed by me and considered in my medical decision making (see chart for details).  Clinical Course     Thomas Townsend is a 83 y.o. male here with fever, rapid afib, hypotension, AMS. Patient alert and oriented now, nonfocal neuro exam. Low grade temp 99 in the ED, hypotensive and tachy as per EMS. Code sepsis initiated. Will give 30 cc/ kg bolus. Will get labs, cultures, lactate, CXR, UA. Will get CT head/neck given possible fall on coumadin. Will likely admit.   10:29 AM CXR showed RUL pneumonia. WBC 44, lactate 2.5. CK nl. CT head/CT cervical unremarkable. Given rocephin, azithro. Hospitalist to admit.   Final Clinical Impressions(s) / ED Diagnoses   Final diagnoses:  None    New Prescriptions New Prescriptions   No medications on file     Drenda Freeze, MD 10/05/16 1031

## 2016-10-05 NOTE — Progress Notes (Signed)
Patient admitted today from home with his son. Diagnosed with pneumonia. BP is running soft, but has improved with IV fluids. Patient has been afebrile. Afib on tele, rate around 80. No complaints of pain. Patient ambulates well to the bathroom. Will continue to monitor.

## 2016-10-05 NOTE — ED Notes (Signed)
Dr Darl Householder notified lactic 2.5

## 2016-10-05 NOTE — ED Notes (Signed)
Admitting MD at bedside. Daughter at bedside visiting. Updated family

## 2016-10-05 NOTE — ED Notes (Signed)
Pt had fall this morning per EMS. Appears to have vomit on shirt. Was confused on scene but oriented now. Denies any pain. Wears 2 L sometimes at home per pt.

## 2016-10-05 NOTE — ED Notes (Signed)
Patient transported to CT 

## 2016-10-06 LAB — BASIC METABOLIC PANEL
ANION GAP: 6 (ref 5–15)
BUN: 17 mg/dL (ref 6–20)
CALCIUM: 7.8 mg/dL — AB (ref 8.9–10.3)
CO2: 21 mmol/L — ABNORMAL LOW (ref 22–32)
CREATININE: 0.82 mg/dL (ref 0.61–1.24)
Chloride: 112 mmol/L — ABNORMAL HIGH (ref 101–111)
GFR calc Af Amer: >60 mL/min (ref >60)
GLUCOSE: 86 mg/dL (ref 65–99)
Potassium: 3.5 mmol/L (ref 3.5–5.1)
Sodium: 139 mmol/L (ref 135–145)

## 2016-10-06 LAB — CBC
HCT: 28.9 % — ABNORMAL LOW (ref 40.0–52.0)
HEMOGLOBIN: 9.5 g/dL — AB (ref 13.0–18.0)
MCH: 30.4 pg (ref 26.0–34.0)
MCHC: 33 g/dL (ref 32.0–36.0)
MCV: 92.2 fL (ref 80.0–100.0)
PLATELETS: 188 10*3/uL (ref 150–440)
RBC: 3.13 MIL/uL — ABNORMAL LOW (ref 4.40–5.90)
RDW: 17.6 % — ABNORMAL HIGH (ref 11.5–14.5)
WBC: 35.2 10*3/uL — ABNORMAL HIGH (ref 3.8–10.6)

## 2016-10-06 LAB — LACTIC ACID, PLASMA: LACTIC ACID, VENOUS: 1.3 mmol/L (ref 0.5–1.9)

## 2016-10-06 LAB — URINE CULTURE

## 2016-10-06 MED ORDER — SODIUM CHLORIDE 0.9 % IV BOLUS (SEPSIS)
500.0000 mL | Freq: Once | INTRAVENOUS | Status: AC
  Administered 2016-10-06: 500 mL via INTRAVENOUS

## 2016-10-06 NOTE — Progress Notes (Signed)
[facility] [place] NAME: Sagan Maione    MR#:  [id number]  DATE OF BIRTH:  11/14/1930  SUBJECTIVE:  CHIEF COMPLAINT:   Chief Complaint  Patient presents with  . Altered Mental Status   Feels better. Afebrile today. Blood pressure continues to be low.  REVIEW OF SYSTEMS:    Review of Systems  Constitutional: Positive for malaise/fatigue. Negative for chills and fever.  HENT: Negative for sore throat.   Eyes: Negative for blurred vision, double vision and pain.  Respiratory: Positive for cough. Negative for hemoptysis, shortness of breath and wheezing.   Cardiovascular: Negative for chest pain, palpitations, orthopnea and leg swelling.  Gastrointestinal: Negative for abdominal pain, constipation, diarrhea, heartburn, nausea and vomiting.  Genitourinary: Negative for dysuria and hematuria.  Musculoskeletal: Negative for back pain and joint pain.  Skin: Negative for rash.  Neurological: Positive for weakness. Negative for sensory change, speech change, focal weakness and headaches.  Endo/Heme/Allergies: Does not bruise/bleed easily.  Psychiatric/Behavioral: Negative for depression. The patient is not nervous/anxious.     DRUG ALLERGIES:  No Known Allergies  VITALS:  Blood pressure (!) 86/54, pulse 92, temperature 97.6 F (36.4 C), temperature source Oral, resp. rate 20, height 5\' 4"  (1.626 m), weight 64.8 kg (142 lb 14.4 oz), SpO2 95 %.  PHYSICAL EXAMINATION:   Physical Exam  GENERAL:  83 y.o.-year-old patient lying in the bed with no acute distress. Decreased hearing. EYES: Pupils equal, round, reactive to light and accommodation. No scleral icterus. Extraocular muscles intact.  HEENT: Head atraumatic, normocephalic. Oropharynx and nasopharynx clear.  NECK:  Supple, no jugular venous distention. No thyroid enlargement, no tenderness.  LUNGS: Normal breath sounds bilaterally, no wheezing, rales, rhonchi. No use of accessory  muscles of respiration.  CARDIOVASCULAR: S1, S2 normal. No murmurs, rubs, or gallops.  ABDOMEN: Soft, nontender, nondistended. Bowel sounds present. No organomegaly or mass.  EXTREMITIES: No cyanosis, clubbing or edema b/l.    NEUROLOGIC: Cranial nerves II through XII are intact. No focal Motor or sensory deficits b/l.   PSYCHIATRIC: The patient is alert and oriented x 3.  SKIN: No obvious rash, lesion, or ulcer.   LABORATORY PANEL:   CBC  Recent Labs Lab 10/06/16 0512  WBC 35.2*  HGB 9.5*  HCT 28.9*  PLT 188   ------------------------------------------------------------------------------------------------------------------ Chemistries   Recent Labs Lab 10/05/16 0940 10/06/16 0512  NA 139 139  K 3.4* 3.5  CL 109 112*  CO2 22 21*  GLUCOSE 107* 86  BUN 23* 17  CREATININE 1.01 0.82  CALCIUM 8.2* 7.8*  AST 30  --   ALT 13*  --   ALKPHOS 84  --   BILITOT 0.9  --    ------------------------------------------------------------------------------------------------------------------  Cardiac Enzymes  Recent Labs Lab 10/05/16 0940  TROPONINI <0.03   ------------------------------------------------------------------------------------------------------------------  RADIOLOGY:  Dg Chest 2 View  Result Date: 10/05/2016 CLINICAL DATA:  Fever and atrial fibrillation.  Fall today. EXAM: CHEST  2 VIEW COMPARISON:  PA and lateral chest 07/13/2016.  CT chest 06/24/2016. FINDINGS: The lungs are markedly emphysematous. There is extensive right upper lobe airspace disease. Left lung is clear. No pneumothorax or pleural effusion. No acute bony abnormality. Aortic atherosclerosis noted. IMPRESSION: Right upper lobe airspace disease most consistent with pneumonia. Recommend followup to clearing. Emphysema. Atherosclerosis. Electronically Signed   By: Inge Rise M.D.   On: 10/05/2016 10:13   Ct Head Wo Contrast  Result Date: 10/05/2016 CLINICAL DATA:  Fall at home. Fever and altered  mental status. Initial encounter. EXAM: CT HEAD WITHOUT CONTRAST CT CERVICAL SPINE WITHOUT CONTRAST TECHNIQUE: Multidetector CT imaging of the head and cervical spine was performed following the standard protocol without intravenous contrast. Multiplanar CT image reconstructions of the cervical spine were also generated. COMPARISON:  06/20/2016 head CT FINDINGS: CT HEAD FINDINGS Brain: No evidence of acute infarction, hemorrhage, hydrocephalus, extra-axial collection or mass lesion/mass effect. Mild-to-moderate generalized cerebral volume loss. Mild microvascular change in the periventricular white matter. Small remote cortical infarct in the right occipital lobe. Vascular: Atherosclerotic calcification.  No hyperdense vessel Skull: No acute or aggressive finding Sinuses/Orbits: Bilateral cataract resection.  No acute finding CT CERVICAL SPINE FINDINGS Alignment: No suspected traumatic malalignment. There is dextroscoliosis and C4-5 anterolisthesis that appears facet mediated. Skull base and vertebrae: Negative for fracture Soft tissues and spinal canal: No prevertebral fluid or swelling. No visible canal hematoma. Disc levels: Severe facet arthropathy on the left, especially from C2-3 to C4-5. There is associated anterolisthesis and scoliosis. Diffuse degenerative disc narrowing. Upper chest: Emphysema. Linear opacities at the apices have a scar like morphology. IMPRESSION: 1. No evidence of acute intracranial or cervical spine injury. 2. Senescent changes and small remote right occipital infarct. 3. Advanced cervical facet arthropathy with listhesis and scoliosis. 4. Emphysema. Electronically Signed   By: Monte Fantasia M.D.   On: 10/05/2016 10:19   Ct Cervical Spine Wo Contrast  Result Date: 10/05/2016 CLINICAL DATA:  Fall at home. Fever and altered mental status. Initial encounter. EXAM: CT HEAD WITHOUT CONTRAST CT CERVICAL SPINE WITHOUT CONTRAST TECHNIQUE: Multidetector CT imaging of the head and cervical  spine was performed following the standard protocol without intravenous contrast. Multiplanar CT image reconstructions of the cervical spine were also generated. COMPARISON:  06/20/2016 head CT FINDINGS: CT HEAD FINDINGS Brain: No evidence of acute infarction, hemorrhage, hydrocephalus, extra-axial collection or mass lesion/mass effect. Mild-to-moderate generalized cerebral volume loss. Mild microvascular change in the periventricular white matter. Small remote cortical infarct in the right occipital lobe. Vascular: Atherosclerotic calcification.  No hyperdense vessel Skull: No acute or aggressive finding Sinuses/Orbits: Bilateral cataract resection.  No acute finding CT CERVICAL SPINE FINDINGS Alignment: No suspected traumatic malalignment. There is dextroscoliosis and C4-5 anterolisthesis that appears facet mediated. Skull base and vertebrae: Negative for fracture Soft tissues and spinal canal: No prevertebral fluid or swelling. No visible canal hematoma. Disc levels: Severe facet arthropathy on the left, especially from C2-3 to C4-5. There is associated anterolisthesis and scoliosis. Diffuse degenerative disc narrowing. Upper chest: Emphysema. Linear opacities at the apices have a scar like morphology. IMPRESSION: 1. No evidence of acute intracranial or cervical spine injury. 2. Senescent changes and small remote right occipital infarct. 3. Advanced cervical facet arthropathy with listhesis and scoliosis. 4. Emphysema. Electronically Signed   By: Monte Fantasia M.D.   On: 10/05/2016 10:19     ASSESSMENT AND PLAN:   * Sepsis due to pneumonia. Patient continues to be hypotensive. We will bolus 500 normal saline stat. Repeat lactic acid.  * Right upper lobe pneumonia with sepsis And acute hypoxic respiratory failure Community acquired pneumonia On IV ceftriaxone and azithromycin. Wean oxygen as tolerated. Nebulizers as needed. Severe leukocytosis is worsened from his previous leukocytosis which is  baseline.  * Atrial fibrillation with rapid ventricular rate due to sepsis. Improved. Continue digoxin.  * DVT prophylaxis. Patient is on warfarin.  All the records are reviewed and case discussed with Care Management/Social Workerr. Management plans discussed with the patient, family and they are in agreement.  CODE STATUS: FULL CODE  DVT Prophylaxis: SCDs  TOTAL TIME TAKING CARE OF THIS PATIENT: 35 minutes. Critical care time  POSSIBLE D/C IN 1-2 DAYS, DEPENDING ON CLINICAL CONDITION.  Hillary Bow R M.D on 10/06/2016 at 10:39 AM  Between 7am to 6pm - Pager - [phone]  After 6pm go to [email or web address] - password [email or web address] [facility]  [facility] [place] [facility]  Office  [phone]  CC: Primary care physician; Allyne Gee, MD  Note: This dictation was prepared with Dragon dictation along with smaller phrase technology. Any transcriptional errors that result from this process are unintentional.

## 2016-10-06 NOTE — Care Management (Signed)
Patient has chronic home oxygen through [facility].  Declined home health at the time of last discharge in September. Admitted with shortness of breath and being treated for pneumonia.  Patient lives with his son and patient says that he still drives.  He is suppose to wear his oxygen continuously but admits he does not always wear it.  "I sure don't wear it when I go to the store."  He is very very in his statement that he does not need home health nurse follow up at discharge.  Says he has a nurse coming next week from [facility].  This person comes once a year.  Patient denies issues accessing medical care,or obtaining his medications.  He is able to drive.

## 2016-10-06 NOTE — Progress Notes (Signed)
[place] responded to an OR for Ad. CH met with the Pt, educated the Pt on Ad, but Pt could not complete the AD because he did not have his eye glasses. [place] visited the Pt again at 1:42pm and completed the Ad. [place] also offered prayers and presence for the Pt.     10/06/16 1500  Clinical Encounter Type  Visited With Patient;Family  Visit Type Follow-up;Spiritual support;Other (Comment)  Referral From Nurse  Consult/Referral To Chaplain  Spiritual Encounters  Spiritual Needs Brochure;Prayer  Stress Factors  Patient Stress Factors None identified  Family Stress Factors Other (Comment)

## 2016-10-07 LAB — PROTIME-INR
INR: 1.95
PROTHROMBIN TIME: 22.5 s — AB (ref 11.4–15.2)

## 2016-10-07 MED ORDER — FUROSEMIDE 20 MG PO TABS
20.0000 mg | ORAL_TABLET | ORAL | Status: DC
  Filled 2016-10-07: qty 1

## 2016-10-07 MED ORDER — LEVOFLOXACIN 500 MG PO TABS: 500.0000 mg | ORAL_TABLET | Freq: Every day | ORAL | 0 refills | Status: DC

## 2016-10-07 MED ORDER — ALBUTEROL SULFATE HFA 108 (90 BASE) MCG/ACT IN AERS: 2.0000 | INHALATION_SPRAY | Freq: Four times a day (QID) | RESPIRATORY_TRACT | 0 refills | Status: DC | PRN

## 2016-10-07 NOTE — Care Management (Signed)
Patient for discharge.  he again declines home health nursing referral.  Instructed that he will need his portable 02 tank for transport home.  Verbalized understanding

## 2016-10-07 NOTE — Progress Notes (Signed)
SATURATION QUALIFICATIONS: (This note is used to comply with regulatory documentation for home oxygen)  Patient Saturations on Room Air at Rest = 91%  Patient Saturations on Room Air while Ambulating = 72%  Patient Saturations on 2 Liters of oxygen while Ambulating = 93%  Please briefly explain why patient needs home oxygen: O2 levels drop on room air with exertion.

## 2016-10-07 NOTE — Care Management Important Message (Signed)
Important Message  Patient Details  Name: Thomas Townsend MRN: [id number] Date of Birth: [birth year]   Medicare Important Message Given:  Yes  Initial signed IM printed from Epic and given to patient.     Katrina Stack, RN 10/07/2016, 11:26 AM

## 2016-10-07 NOTE — Progress Notes (Signed)
Discharge instructions along with home medication list and follow up gone over with patient and family. Verbalized understanding. No printed rx given to patient. Iv and telemetry removed. No c/o pain. Personal o2 tank at bedside.

## 2016-10-07 NOTE — Discharge Summary (Signed)
[facility] [place] NAME: Thomas Townsend    MR#:  [id number]  DATE OF BIRTH:  1931-04-07  DATE OF ADMISSION:  10/05/2016 ADMITTING PHYSICIAN: Hillary Bow, MD  DATE OF DISCHARGE: 10/07/2016 12:05 PM  PRIMARY CARE PHYSICIAN: KHAN,SAADAT A, MD   ADMISSION DIAGNOSIS:  Community acquired pneumonia of right upper lobe of lung ([place]) [J18.1]  DISCHARGE DIAGNOSIS:  Active Problems:   Right upper lobe pneumonia ([place])   SECONDARY DIAGNOSIS:   Past Medical History:  Diagnosis Date  . Arthritis   . Atrial fibrillation ([place])   . CHF (congestive heart failure) ([place])   . COPD (chronic obstructive pulmonary disease) ([place])   . Dysrhythmia   . GERD (gastroesophageal reflux disease)   . Hyperlipemia   . Hypertension   . Leukocytosis 01/14/2016  . Prostate cancer ([place])      ADMITTING HISTORY  HISTORY OF PRESENT ILLNESS:  Thomas Townsend  is a 83 y.o. male with a known history of Atrial fibrillation, leukocytosis, prostate cancer presents to the emergency room brought in by EMS after a neighbor found him alternate. Patient felt well yesterday. He woke up at night feeling extremely cold and increase the thermostat level. Had 4 blankets. [place] and one of his neighbors found him at home altered. He was found to have temperature 100.4 with atrial fibrillation and rapid ventricular rate. Chest x-ray showing right upper lobe pneumonia. WBC count of 43,000. Baseline is close to 35,000.  Patient is more oriented at this time.  HOSPITAL COURSE:   * Sepsis due to pneumonia.  Hypertension responded well to fluids. Lactic acid trended down to normal on repeat check.  * Right upper lobe pneumonia with sepsis And acute hypoxic respiratory failure Treated with IV ceftriaxone and azithromycin in the hospital. Sepsis resolved. Patient does have home oxygen which will be continued at discharge. Change IV antibiotics to oral  Levaquin at home. On day of discharge patient has ambulated without any problems.  * Atrial fibrillation with rapid ventricular rate due to sepsis. Improved. Continue digoxin.  * DVT prophylaxis. Patient is on warfarin.  Stable for discharge home to follow-up with his primary care physician in one week.  * Chronic leukocytosis is stable  CONSULTS OBTAINED:    DRUG ALLERGIES:  No Known Allergies  DISCHARGE MEDICATIONS:   Discharge Medication List as of 10/07/2016 11:29 AM    START taking these medications   Details  albuterol (PROVENTIL HFA;VENTOLIN HFA) 108 (90 Base) MCG/ACT inhaler Inhale 2 puffs into the lungs every 6 (six) hours as needed for wheezing or shortness of breath., Starting Thu 10/07/2016, Normal    levofloxacin (LEVAQUIN) 500 MG tablet Take 1 tablet (500 mg total) by mouth daily., Starting Thu 10/07/2016, Normal      CONTINUE these medications which have NOT CHANGED   Details  BREO ELLIPTA 100-25 MCG/INH AEPB Inhale 1 puff into the lungs every morning., Starting Tue 01/28/2015, Historical Med    digoxin (LANOXIN) 0.125 MG tablet Take 0.125 mg by mouth every morning. , Starting Fri 02/22/2014, Historical Med    ferrous sulfate 325 (65 FE) MG tablet Take 325 mg by mouth every morning. Reported on 02/25/2016, Historical Med    folic acid (FOLVITE) 1 MG tablet Take 1 mg by mouth daily. , Historical Med    furosemide (LASIX) 20 MG tablet Take 20 mg by mouth every other day. In the morning, Historical Med  methotrexate (RHEUMATREX) 2.5 MG tablet TAKE 6 TABLETS (15 MG TOTAL) BY MOUTH EVERY 7 (SEVEN) DAYS., Historical Med    montelukast (SINGULAIR) 10 MG tablet Take 1 tablet by mouth at bedtime. , Historical Med    Multiple Vitamins-Minerals (PRESERVISION AREDS PO) Take 1 tablet by mouth every morning. , Historical Med    warfarin (COUMADIN) 4 MG tablet Take 4 mg by mouth daily., Historical Med    ibuprofen (ADVIL) 200 MG tablet Take 400 mg by mouth every  morning., Historical Med        Today   VITAL SIGNS:  Blood pressure 101/65, pulse 82, temperature 97.7 F (36.5 C), temperature source Oral, resp. rate 18, height 5\' 4"  (1.626 m), weight 63.5 kg (140 lb), SpO2 94 %.  I/O:   Intake/Output Summary (Last 24 hours) at 10/07/16 1448 Last data filed at 10/07/16 0912  Gross per 24 hour  Intake                0 ml  Output              585 ml  Net             -585 ml    PHYSICAL EXAMINATION:  Physical Exam  GENERAL:  83 y.o.-year-old patient lying in the bed with no acute distress. Decreased hearing LUNGS: Normal breath sounds bilaterally, no wheezing, rales,rhonchi or crepitation. No use of accessory muscles of respiration.  CARDIOVASCULAR: S1, S2 normal. No murmurs, rubs, or gallops.  ABDOMEN: Soft, non-tender, non-distended. Bowel sounds present. No organomegaly or mass.  NEUROLOGIC: Moves all 4 extremities. PSYCHIATRIC: The patient is alert and oriented x 3.  SKIN: No obvious rash, lesion, or ulcer.   DATA REVIEW:   CBC  Recent Labs Lab 10/06/16 0512  WBC 35.2*  HGB 9.5*  HCT 28.9*  PLT 188    Chemistries   Recent Labs Lab 10/05/16 0940 10/06/16 0512  NA 139 139  K 3.4* 3.5  CL 109 112*  CO2 22 21*  GLUCOSE 107* 86  BUN 23* 17  CREATININE 1.01 0.82  CALCIUM 8.2* 7.8*  AST 30  --   ALT 13*  --   ALKPHOS 84  --   BILITOT 0.9  --     Cardiac Enzymes  Recent Labs Lab 10/05/16 0940  TROPONINI <0.03    Microbiology Results  Results for orders placed or performed during the hospital encounter of 10/05/16  Urine culture     Status: Abnormal   Collection Time: 10/05/16  9:33 AM  Result Value Ref Range Status   Specimen Description URINE, RANDOM  Final   Special Requests NONE  Final   Culture (A)  Final    <10,000 COLONIES/mL INSIGNIFICANT GROWTH Performed at [facility]    Report Status 10/06/2016 FINAL  Final  Blood Culture (routine x 2)     Status: None (Preliminary result)    Collection Time: 10/05/16  9:40 AM  Result Value Ref Range Status   Specimen Description BLOOD L HAND  Final   Special Requests   Final    BOTTLES DRAWN AEROBIC AND ANAEROBIC AER 7ML ANA 6ML   Culture NO GROWTH 2 DAYS  Final   Report Status PENDING  Incomplete  Blood Culture (routine x 2)     Status: None (Preliminary result)   Collection Time: 10/05/16  9:40 AM  Result Value Ref Range Status   Specimen Description BLOOD L ARM  Final   Special Requests   Final  BOTTLES DRAWN AEROBIC AND ANAEROBIC AER 5ML ANA 3ML   Culture NO GROWTH 2 DAYS  Final   Report Status PENDING  Incomplete    RADIOLOGY:  No results found.  Follow up with PCP in 1 week.  Management plans discussed with the patient, family and they are in agreement.  CODE STATUS:     Code Status Orders        Start     Ordered   10/05/16 1048  Full code  Continuous     10/05/16 1048    Code Status History    Date Active Date Inactive Code Status Order ID Comments User Context   06/21/2016  2:22 AM 06/26/2016  7:39 PM Partial Code [id number]  Lance Coon, MD Inpatient   02/26/2016  2:34 PM 02/27/2016  3:03 PM Partial Code [id number]  Vaughan Basta, MD Inpatient   08/06/2015  2:59 AM 08/11/2015  3:21 PM Full Code [id number]  Lytle Butte, MD ED      TOTAL TIME TAKING CARE OF THIS PATIENT ON DAY OF DISCHARGE: more than 30 minutes.   Hillary Bow R M.D on 10/07/2016 at 2:48 PM  Between 7am to 6pm - Pager - [phone]  After 6pm go to [email or web address] - password [email or web address] [facility]  [facility]  Office  [phone]  CC: Primary care physician; Allyne Gee, MD  Note: This dictation was prepared with Dragon dictation along with smaller phrase technology. Any transcriptional errors that result from this process are unintentional.

## 2016-10-10 LAB — CULTURE, BLOOD (ROUTINE X 2)
Culture: NO GROWTH
Culture: NO GROWTH

## 2016-10-19 DIAGNOSIS — J189 Pneumonia, unspecified organism: Secondary | ICD-10-CM | POA: Diagnosis not present

## 2016-10-19 DIAGNOSIS — I482 Chronic atrial fibrillation: Secondary | ICD-10-CM | POA: Diagnosis not present

## 2016-10-19 DIAGNOSIS — J441 Chronic obstructive pulmonary disease with (acute) exacerbation: Secondary | ICD-10-CM | POA: Diagnosis not present

## 2016-10-19 DIAGNOSIS — C9290 Myeloid leukemia, unspecified, not having achieved remission: Secondary | ICD-10-CM | POA: Diagnosis not present

## 2016-10-26 DIAGNOSIS — J189 Pneumonia, unspecified organism: Secondary | ICD-10-CM | POA: Diagnosis not present

## 2016-10-27 ENCOUNTER — Other Ambulatory Visit: Payer: Self-pay | Admitting: *Deleted

## 2016-10-27 DIAGNOSIS — I480 Paroxysmal atrial fibrillation: Secondary | ICD-10-CM | POA: Diagnosis not present

## 2016-10-27 DIAGNOSIS — D471 Chronic myeloproliferative disease: Secondary | ICD-10-CM

## 2016-11-17 DIAGNOSIS — M0579 Rheumatoid arthritis with rheumatoid factor of multiple sites without organ or systems involvement: Secondary | ICD-10-CM | POA: Diagnosis not present

## 2016-11-24 ENCOUNTER — Inpatient Hospital Stay: Payer: Medicare Other

## 2016-11-24 ENCOUNTER — Inpatient Hospital Stay: Payer: Medicare Other | Attending: Internal Medicine | Admitting: Internal Medicine

## 2016-11-24 DIAGNOSIS — K219 Gastro-esophageal reflux disease without esophagitis: Secondary | ICD-10-CM | POA: Diagnosis not present

## 2016-11-24 DIAGNOSIS — E785 Hyperlipidemia, unspecified: Secondary | ICD-10-CM | POA: Insufficient documentation

## 2016-11-24 DIAGNOSIS — J449 Chronic obstructive pulmonary disease, unspecified: Secondary | ICD-10-CM | POA: Insufficient documentation

## 2016-11-24 DIAGNOSIS — D500 Iron deficiency anemia secondary to blood loss (chronic): Secondary | ICD-10-CM

## 2016-11-24 DIAGNOSIS — D509 Iron deficiency anemia, unspecified: Secondary | ICD-10-CM | POA: Diagnosis not present

## 2016-11-24 DIAGNOSIS — Z79899 Other long term (current) drug therapy: Secondary | ICD-10-CM | POA: Insufficient documentation

## 2016-11-24 DIAGNOSIS — D72829 Elevated white blood cell count, unspecified: Secondary | ICD-10-CM | POA: Diagnosis not present

## 2016-11-24 DIAGNOSIS — I509 Heart failure, unspecified: Secondary | ICD-10-CM | POA: Diagnosis not present

## 2016-11-24 DIAGNOSIS — I10 Essential (primary) hypertension: Secondary | ICD-10-CM | POA: Insufficient documentation

## 2016-11-24 DIAGNOSIS — D471 Chronic myeloproliferative disease: Secondary | ICD-10-CM

## 2016-11-24 DIAGNOSIS — I4891 Unspecified atrial fibrillation: Secondary | ICD-10-CM

## 2016-11-24 DIAGNOSIS — M069 Rheumatoid arthritis, unspecified: Secondary | ICD-10-CM

## 2016-11-24 DIAGNOSIS — Z7901 Long term (current) use of anticoagulants: Secondary | ICD-10-CM | POA: Diagnosis not present

## 2016-11-24 DIAGNOSIS — J189 Pneumonia, unspecified organism: Secondary | ICD-10-CM | POA: Diagnosis not present

## 2016-11-24 DIAGNOSIS — C61 Malignant neoplasm of prostate: Secondary | ICD-10-CM | POA: Diagnosis not present

## 2016-11-24 DIAGNOSIS — Z87891 Personal history of nicotine dependence: Secondary | ICD-10-CM | POA: Insufficient documentation

## 2016-11-24 DIAGNOSIS — I480 Paroxysmal atrial fibrillation: Secondary | ICD-10-CM | POA: Diagnosis not present

## 2016-11-24 LAB — CBC WITH DIFFERENTIAL/PLATELET
Basophils Absolute: 0.1 10*3/uL (ref 0–0.1)
Basophils Relative: 1 %
Eosinophils Absolute: 0.1 10*3/uL (ref 0–0.7)
Eosinophils Relative: 1 %
HCT: 29.5 % — ABNORMAL LOW (ref 40.0–52.0)
HEMOGLOBIN: 10 g/dL — AB (ref 13.0–18.0)
LYMPHS PCT: 8 %
Lymphs Abs: 1.2 10*3/uL (ref 1.0–3.6)
MCH: 31.9 pg (ref 26.0–34.0)
MCHC: 34 g/dL (ref 32.0–36.0)
MCV: 93.9 fL (ref 80.0–100.0)
Monocytes Absolute: 1.5 10*3/uL — ABNORMAL HIGH (ref 0.2–1.0)
Monocytes Relative: 10 %
NEUTROS ABS: 12 10*3/uL — AB (ref 1.4–6.5)
NEUTROS PCT: 80 %
Platelets: 239 10*3/uL (ref 150–440)
RBC: 3.14 MIL/uL — AB (ref 4.40–5.90)
RDW: 18.9 % — ABNORMAL HIGH (ref 11.5–14.5)
WBC: 14.9 10*3/uL — AB (ref 3.8–10.6)

## 2016-11-24 LAB — COMPREHENSIVE METABOLIC PANEL
ALT: 18 U/L (ref 17–63)
AST: 32 U/L (ref 15–41)
Albumin: 3.9 g/dL (ref 3.5–5.0)
Alkaline Phosphatase: 123 U/L (ref 38–126)
Anion gap: 10 (ref 5–15)
BUN: 20 mg/dL (ref 6–20)
CHLORIDE: 104 mmol/L (ref 101–111)
CO2: 21 mmol/L — ABNORMAL LOW (ref 22–32)
Calcium: 8.5 mg/dL — ABNORMAL LOW (ref 8.9–10.3)
Creatinine, Ser: 0.89 mg/dL (ref 0.61–1.24)
Glucose, Bld: 153 mg/dL — ABNORMAL HIGH (ref 65–99)
POTASSIUM: 3.5 mmol/L (ref 3.5–5.1)
Sodium: 135 mmol/L (ref 135–145)
Total Bilirubin: 0.4 mg/dL (ref 0.3–1.2)
Total Protein: 7.2 g/dL (ref 6.5–8.1)

## 2016-11-24 LAB — LACTATE DEHYDROGENASE: LDH: 170 U/L (ref 98–192)

## 2016-11-24 LAB — IRON AND TIBC
IRON: 67 ug/dL (ref 45–182)
SATURATION RATIOS: 29 % (ref 17.9–39.5)
TIBC: 230 ug/dL — ABNORMAL LOW (ref 250–450)
UIBC: 163 ug/dL

## 2016-11-24 LAB — FERRITIN: FERRITIN: 367 ng/mL — AB (ref 24–336)

## 2016-11-24 NOTE — Progress Notes (Signed)
Patient here today for follow up.   

## 2016-11-24 NOTE — Assessment & Plan Note (Addendum)
#   Leukocytosis predominant neutrophilia- again unclear etiology- Likely myeloproliferative neoplasm.  Today his white count is 14,000/ improved.   # Anemia hemoglobin 10.7/ unclear etiology. Stable. Monitor for now.   # Irregular heart rhythm/A.fib- controlled.   # Prostate cancer early stage [follows up with urology; Dr.Tennenbaum; [place]]  # Follow-up in 3 months with labs/iron studies.

## 2016-11-24 NOTE — Progress Notes (Signed)
[facility] OFFICE PROGRESS NOTE  Patient Care Team: Allyne Gee, MD as PCP - General (Internal Medicine)   SUMMARY OF HEMATOLOGIC/ONCOLOGIC HISTORY:  # Leucocytosis- 20-30K [Dr.Pandit-BMBx 2013-No definitive features of a Myeloproliferative neoplasia including CMML, normal cytogenetics (48 XY. Normocellular to focally mildly hypocellular marrow for age (30-50%) with trilineage hematopoiesis, no overt monocytosis ; Flow study unremarkable. Peripheral blood flow- suggestive of chronic myelomonocytic leukemia]; JAK2/Bcr-Abl-NEG.2013- [place]- No hepato-splenomegaly.   # IDA- ? Etiology.  # RA on MXT; prednisone 5mg  discon; Prostate cancer [early stage] f/u Dr.Tennenbaum [place].   INTERVAL HISTORY:  A very pleasant 83 -year-old male patient with a prior history of long-standing leukocytosis and mild anemia is here for follow-up.    Today he feels good. Denies any significant fatigue. Denies any blood in stools or black stools. He is on iron therapy. His appetite is good. No weight loss. No night sweats no fevers. Denies any chest pain shortness of breath or cough.  No admission the hospital.  REVIEW OF SYSTEMS:  A complete 10 point review of system is done which is negative except mentioned above/history of present illness.   PAST MEDICAL HISTORY :  Past Medical History:  Diagnosis Date  . Arthritis   . Atrial fibrillation ([place])   . CHF (congestive heart failure) ([place])   . COPD (chronic obstructive pulmonary disease) ([place])   . Dysrhythmia   . GERD (gastroesophageal reflux disease)   . Hyperlipemia   . Hypertension   . Leukocytosis 01/14/2016  . Prostate cancer ([place])     PAST SURGICAL HISTORY :   Past Surgical History:  Procedure Laterality Date  . CHOLECYSTECTOMY    . COLONOSCOPY WITH PROPOFOL N/A 04/04/2015   Procedure: COLONOSCOPY WITH PROPOFOL;  Surgeon: Manya Silvas, MD;  Location: [facility] ENDOSCOPY;  Service: Endoscopy;  Laterality: N/A;  .  ESOPHAGOGASTRODUODENOSCOPY N/A 04/04/2015   Procedure: ESOPHAGOGASTRODUODENOSCOPY (EGD);  Surgeon: Manya Silvas, MD;  Location: [facility] ENDOSCOPY;  Service: Endoscopy;  Laterality: N/A;    FAMILY HISTORY :   Family History  Problem Relation Age of Onset  . Hypertension Other     SOCIAL HISTORY:   Social History  Substance Use Topics  . Smoking status: Former Smoker    Years: 20.00    Types: Cigarettes  . Smokeless tobacco: Never Used  . Alcohol use 0.0 oz/week     Comment: Wine every once in a while    ALLERGIES:  has No Known Allergies.  MEDICATIONS:  Current Outpatient Prescriptions  Medication Sig Dispense Refill  . albuterol (PROVENTIL HFA;VENTOLIN HFA) 108 (90 Base) MCG/ACT inhaler Inhale 2 puffs into the lungs every 6 (six) hours as needed for wheezing or shortness of breath. 8 g 0  . BREO ELLIPTA 100-25 MCG/INH AEPB Inhale 1 puff into the lungs every morning.    . digoxin (LANOXIN) 0.125 MG tablet Take 0.125 mg by mouth every morning.     . ferrous sulfate 325 (65 FE) MG tablet Take 325 mg by mouth every morning. Reported on 0000000    . folic acid (FOLVITE) 1 MG tablet Take 1 mg by mouth daily.   11  . furosemide (LASIX) 20 MG tablet Take 20 mg by mouth every other day. In the morning    . methotrexate (RHEUMATREX) 2.5 MG tablet TAKE 6 TABLETS (15 MG TOTAL) BY MOUTH EVERY 7 (SEVEN) DAYS.  4  . montelukast (SINGULAIR) 10 MG tablet Take 1 tablet by mouth at bedtime.     . Multiple  Vitamins-Minerals (PRESERVISION AREDS PO) Take 1 tablet by mouth every morning.     . warfarin (COUMADIN) 4 MG tablet Take 4 mg by mouth daily.     No current facility-administered medications for this visit.     PHYSICAL EXAMINATION: ECOG PERFORMANCE STATUS: \  BP 112/76 (BP Location: Left Arm, Patient Position: Sitting)   Pulse 65 Comment: ranging between 65 - 126   Temp (!) 96.9 F (36.1 C) (Tympanic)   Wt 148 lb 2 oz (67.2 kg)   SpO2 97%   BMI 25.43 kg/m   Filed Weights    11/24/16 1457  Weight: 148 lb 2 oz (67.2 kg)    GENERAL: Well-nourished well-developed; Alert, no distress and comfortable.   Alone. EYES: no pallor or icterus OROPHARYNX: no thrush or ulceration; dentures.  NECK: supple, no masses felt LYMPH:  no palpable lymphadenopathy in the cervical, axillary or inguinal regions LUNGS: clear to auscultation and  No wheeze or crackles HEART/CVS:irregular rate & rhythm and no murmurs; No lower extremity edema ABDOMEN:abdomen soft, non-tender and normal bowel sounds Musculoskeletal:no cyanosis of digits and no clubbing  PSYCH: alert & oriented x 3 with fluent speech NEURO: no focal motor/sensory deficits SKIN:  no rashes or significant lesions  LABORATORY DATA:  I have reviewed the data as listed    Component Value Date/Time   NA 135 11/24/2016 1422   NA 140 01/22/2015 0345   K 3.5 11/24/2016 1422   K 3.7 01/22/2015 0345   CL 104 11/24/2016 1422   CL 106 01/22/2015 0345   CO2 21 (L) 11/24/2016 1422   CO2 25 01/22/2015 0345   GLUCOSE 153 (H) 11/24/2016 1422   GLUCOSE 89 01/22/2015 0345   BUN 20 11/24/2016 1422   BUN 16 01/22/2015 0345   CREATININE 0.89 11/24/2016 1422   CREATININE 0.88 01/22/2015 0345   CALCIUM 8.5 (L) 11/24/2016 1422   CALCIUM 7.7 (L) 01/22/2015 0345   PROT 7.2 11/24/2016 1422   PROT 6.8 01/21/2015 1946   ALBUMIN 3.9 11/24/2016 1422   ALBUMIN 3.3 (L) 01/21/2015 1946   AST 32 11/24/2016 1422   AST 20 01/21/2015 1946   ALT 18 11/24/2016 1422   ALT 6 (L) 01/21/2015 1946   ALKPHOS 123 11/24/2016 1422   ALKPHOS 86 01/21/2015 1946   BILITOT 0.4 11/24/2016 1422   BILITOT 0.9 01/21/2015 1946   GFRNONAA >60 11/24/2016 1422   GFRNONAA >60 01/22/2015 0345   GFRAA >60 11/24/2016 1422   GFRAA >60 01/22/2015 0345    No results found for: SPEP, UPEP  Lab Results  Component Value Date   WBC 14.9 (H) 11/24/2016   NEUTROABS 12.0 (H) 11/24/2016   HGB 10.0 (L) 11/24/2016   HCT 29.5 (L) 11/24/2016   MCV 93.9 11/24/2016    PLT 239 11/24/2016      Chemistry      Component Value Date/Time   NA 135 11/24/2016 1422   NA 140 01/22/2015 0345   K 3.5 11/24/2016 1422   K 3.7 01/22/2015 0345   CL 104 11/24/2016 1422   CL 106 01/22/2015 0345   CO2 21 (L) 11/24/2016 1422   CO2 25 01/22/2015 0345   BUN 20 11/24/2016 1422   BUN 16 01/22/2015 0345   CREATININE 0.89 11/24/2016 1422   CREATININE 0.88 01/22/2015 0345      Component Value Date/Time   CALCIUM 8.5 (L) 11/24/2016 1422   CALCIUM 7.7 (L) 01/22/2015 0345   ALKPHOS 123 11/24/2016 1422   ALKPHOS 86 01/21/2015 1946  AST 32 11/24/2016 1422   AST 20 01/21/2015 1946   ALT 18 11/24/2016 1422   ALT 6 (L) 01/21/2015 1946   BILITOT 0.4 11/24/2016 1422   BILITOT 0.9 01/21/2015 1946         ASSESSMENT & PLAN:   Myeloproliferative neoplasm (HCC) # Leukocytosis predominant neutrophilia- again unclear etiology- Likely myeloproliferative neoplasm.  Today his white count is 14,000/ improved.   # Anemia hemoglobin 10.7/ unclear etiology. Stable. Monitor for now.   # Irregular heart rhythm/A.fib- controlled.   # Prostate cancer early stage [follows up with urology; Dr.Tennenbaum; [place]]  # Follow-up in 3 months with labs/iron studies.    Cammie Sickle, MD 11/24/2016 4:34 PM

## 2016-11-25 ENCOUNTER — Other Ambulatory Visit: Payer: Self-pay | Admitting: *Deleted

## 2016-11-25 DIAGNOSIS — D471 Chronic myeloproliferative disease: Secondary | ICD-10-CM

## 2016-12-17 DIAGNOSIS — H353134 Nonexudative age-related macular degeneration, bilateral, advanced atrophic with subfoveal involvement: Secondary | ICD-10-CM | POA: Diagnosis not present

## 2016-12-17 DIAGNOSIS — H04123 Dry eye syndrome of bilateral lacrimal glands: Secondary | ICD-10-CM | POA: Diagnosis not present

## 2016-12-22 DIAGNOSIS — I480 Paroxysmal atrial fibrillation: Secondary | ICD-10-CM | POA: Diagnosis not present

## 2016-12-24 DIAGNOSIS — J189 Pneumonia, unspecified organism: Secondary | ICD-10-CM | POA: Diagnosis not present

## 2017-01-06 ENCOUNTER — Encounter (INDEPENDENT_AMBULATORY_CARE_PROVIDER_SITE_OTHER): Payer: Medicare Other | Admitting: Ophthalmology

## 2017-01-06 DIAGNOSIS — H43812 Vitreous degeneration, left eye: Secondary | ICD-10-CM | POA: Diagnosis not present

## 2017-01-06 DIAGNOSIS — H35341 Macular cyst, hole, or pseudohole, right eye: Secondary | ICD-10-CM | POA: Diagnosis not present

## 2017-01-06 DIAGNOSIS — H353114 Nonexudative age-related macular degeneration, right eye, advanced atrophic with subfoveal involvement: Secondary | ICD-10-CM

## 2017-01-06 DIAGNOSIS — H353221 Exudative age-related macular degeneration, left eye, with active choroidal neovascularization: Secondary | ICD-10-CM

## 2017-01-06 DIAGNOSIS — I10 Essential (primary) hypertension: Secondary | ICD-10-CM

## 2017-01-19 DIAGNOSIS — I480 Paroxysmal atrial fibrillation: Secondary | ICD-10-CM | POA: Diagnosis not present

## 2017-01-24 DIAGNOSIS — J189 Pneumonia, unspecified organism: Secondary | ICD-10-CM | POA: Diagnosis not present

## 2017-01-27 DIAGNOSIS — R0602 Shortness of breath: Secondary | ICD-10-CM | POA: Diagnosis not present

## 2017-01-27 DIAGNOSIS — I482 Chronic atrial fibrillation: Secondary | ICD-10-CM | POA: Diagnosis not present

## 2017-01-27 DIAGNOSIS — J449 Chronic obstructive pulmonary disease, unspecified: Secondary | ICD-10-CM | POA: Diagnosis not present

## 2017-02-01 ENCOUNTER — Encounter (INDEPENDENT_AMBULATORY_CARE_PROVIDER_SITE_OTHER): Payer: Medicare Other | Admitting: Ophthalmology

## 2017-02-01 DIAGNOSIS — H353221 Exudative age-related macular degeneration, left eye, with active choroidal neovascularization: Secondary | ICD-10-CM

## 2017-02-01 DIAGNOSIS — H353114 Nonexudative age-related macular degeneration, right eye, advanced atrophic with subfoveal involvement: Secondary | ICD-10-CM | POA: Diagnosis not present

## 2017-02-01 DIAGNOSIS — H43813 Vitreous degeneration, bilateral: Secondary | ICD-10-CM | POA: Diagnosis not present

## 2017-02-15 DIAGNOSIS — I480 Paroxysmal atrial fibrillation: Secondary | ICD-10-CM | POA: Diagnosis not present

## 2017-02-15 DIAGNOSIS — M0579 Rheumatoid arthritis with rheumatoid factor of multiple sites without organ or systems involvement: Secondary | ICD-10-CM | POA: Diagnosis not present

## 2017-02-16 ENCOUNTER — Inpatient Hospital Stay: Payer: Medicare Other | Attending: Internal Medicine

## 2017-02-16 ENCOUNTER — Other Ambulatory Visit: Payer: Self-pay

## 2017-02-16 ENCOUNTER — Inpatient Hospital Stay (HOSPITAL_BASED_OUTPATIENT_CLINIC_OR_DEPARTMENT_OTHER): Payer: Medicare Other | Admitting: Internal Medicine

## 2017-02-16 VITALS — BP 105/64 | HR 52 | Temp 97.3°F | Resp 18 | Wt 139.0 lb

## 2017-02-16 DIAGNOSIS — I10 Essential (primary) hypertension: Secondary | ICD-10-CM | POA: Insufficient documentation

## 2017-02-16 DIAGNOSIS — M069 Rheumatoid arthritis, unspecified: Secondary | ICD-10-CM

## 2017-02-16 DIAGNOSIS — Z79899 Other long term (current) drug therapy: Secondary | ICD-10-CM

## 2017-02-16 DIAGNOSIS — I110 Hypertensive heart disease with heart failure: Secondary | ICD-10-CM

## 2017-02-16 DIAGNOSIS — J449 Chronic obstructive pulmonary disease, unspecified: Secondary | ICD-10-CM

## 2017-02-16 DIAGNOSIS — K219 Gastro-esophageal reflux disease without esophagitis: Secondary | ICD-10-CM | POA: Insufficient documentation

## 2017-02-16 DIAGNOSIS — E785 Hyperlipidemia, unspecified: Secondary | ICD-10-CM

## 2017-02-16 DIAGNOSIS — I4891 Unspecified atrial fibrillation: Secondary | ICD-10-CM | POA: Insufficient documentation

## 2017-02-16 DIAGNOSIS — D72829 Elevated white blood cell count, unspecified: Secondary | ICD-10-CM | POA: Diagnosis not present

## 2017-02-16 DIAGNOSIS — Z87891 Personal history of nicotine dependence: Secondary | ICD-10-CM | POA: Diagnosis not present

## 2017-02-16 DIAGNOSIS — C61 Malignant neoplasm of prostate: Secondary | ICD-10-CM | POA: Diagnosis not present

## 2017-02-16 DIAGNOSIS — D471 Chronic myeloproliferative disease: Secondary | ICD-10-CM

## 2017-02-16 DIAGNOSIS — I509 Heart failure, unspecified: Secondary | ICD-10-CM

## 2017-02-16 DIAGNOSIS — Z7901 Long term (current) use of anticoagulants: Secondary | ICD-10-CM | POA: Insufficient documentation

## 2017-02-16 DIAGNOSIS — D509 Iron deficiency anemia, unspecified: Secondary | ICD-10-CM | POA: Diagnosis not present

## 2017-02-16 DIAGNOSIS — R5383 Other fatigue: Secondary | ICD-10-CM | POA: Diagnosis not present

## 2017-02-16 LAB — COMPREHENSIVE METABOLIC PANEL
ALBUMIN: 3.8 g/dL (ref 3.5–5.0)
ALT: 13 U/L — AB (ref 17–63)
ANION GAP: 8 (ref 5–15)
AST: 24 U/L (ref 15–41)
Alkaline Phosphatase: 92 U/L (ref 38–126)
BILIRUBIN TOTAL: 0.8 mg/dL (ref 0.3–1.2)
BUN: 24 mg/dL — ABNORMAL HIGH (ref 6–20)
CALCIUM: 8.9 mg/dL (ref 8.9–10.3)
CO2: 25 mmol/L (ref 22–32)
Chloride: 104 mmol/L (ref 101–111)
Creatinine, Ser: 1.11 mg/dL (ref 0.61–1.24)
GFR calc Af Amer: >60 mL/min (ref >60)
GFR, EST NON AFRICAN AMERICAN: 59 mL/min — AB (ref >60)
Glucose, Bld: 107 mg/dL — ABNORMAL HIGH (ref 65–99)
Potassium: 3.7 mmol/L (ref 3.5–5.1)
Sodium: 137 mmol/L (ref 135–145)
TOTAL PROTEIN: 7.4 g/dL (ref 6.5–8.1)

## 2017-02-16 LAB — CBC WITH DIFFERENTIAL/PLATELET
Basophils Absolute: 0.2 K/uL — ABNORMAL HIGH (ref 0–0.1)
Basophils Relative: 1 %
Eosinophils Absolute: 0.2 K/uL (ref 0–0.7)
Eosinophils Relative: 1 %
HCT: 30.8 % — ABNORMAL LOW (ref 40.0–52.0)
Hemoglobin: 10.4 g/dL — ABNORMAL LOW (ref 13.0–18.0)
Lymphocytes Relative: 6 %
Lymphs Abs: 1.4 K/uL (ref 1.0–3.6)
MCH: 32.8 pg (ref 26.0–34.0)
MCHC: 33.6 g/dL (ref 32.0–36.0)
MCV: 97.4 fL (ref 80.0–100.0)
Monocytes Absolute: 3 K/uL — ABNORMAL HIGH (ref 0.2–1.0)
Monocytes Relative: 13 %
Neutro Abs: 17.9 K/uL — ABNORMAL HIGH (ref 1.4–6.5)
Neutrophils Relative %: 79 %
Platelets: 219 K/uL (ref 150–440)
RBC: 3.16 MIL/uL — ABNORMAL LOW (ref 4.40–5.90)
RDW: 17.9 % — ABNORMAL HIGH (ref 11.5–14.5)
WBC: 22.7 K/uL — ABNORMAL HIGH (ref 3.8–10.6)

## 2017-02-16 LAB — IRON AND TIBC
IRON: 97 ug/dL (ref 45–182)
Saturation Ratios: 48 % — ABNORMAL HIGH (ref 17.9–39.5)
TIBC: 204 ug/dL — ABNORMAL LOW (ref 250–450)
UIBC: 107 ug/dL

## 2017-02-16 LAB — FERRITIN: FERRITIN: 432 ng/mL — AB (ref 24–336)

## 2017-02-16 LAB — LACTATE DEHYDROGENASE: LDH: 156 U/L (ref 98–192)

## 2017-02-16 NOTE — Assessment & Plan Note (Addendum)
#  Leukocytosis predominant neutrophilia- again unclear etiology- Likely myeloproliferative neoplasm.  Today his white count is 22,000 today; up from 14,000 approximately 2 months ago. No source of infection. Discussed that if his counts continue to get worse- a repeat bone marrow biopsy might be warranted. Also, discussed regarding use of hypomethylating agent- if this turns out to be CMML.  He and his daughter were not too keen on treatments at this time. Observation is reasonable.  # Anemia hemoglobin 10.7-likely secondary to above primary bone marrow process. Iron studies -no iron deficiency anemia. Stable. Monitor for now.   # /A.fib/CHF controlled- compensated.   # Prostate cancer early stage [follows up with urology; Dr.Tennenbaum; [place]]  # Follow-up in 3 months with labs. The above plan of care was discussed with the patient and his daughter in detail.  Cc; Dr.Khan 

## 2017-02-16 NOTE — Progress Notes (Signed)
[facility] OFFICE PROGRESS NOTE  Patient Care Team: Allyne Gee, MD as PCP - General (Internal Medicine)   SUMMARY OF HEMATOLOGIC/ONCOLOGIC HISTORY:  # Leucocytosis- 20-30K [Dr.Pandit-BMBx 2013-No definitive features of a Myeloproliferative neoplasia including CMML, normal cytogenetics (73 XY. Normocellular to focally mildly hypocellular marrow for age (30-50%) with trilineage hematopoiesis, no overt monocytosis ; Flow study unremarkable. Peripheral blood flow- suggestive of chronic myelomonocytic leukemia]; JAK2/Bcr-Abl-NEG.2013- [place]- No hepato-splenomegaly.   # IDA- ? Etiology.  # RA on MXT; prednisone 69m discon; Prostate cancer [early stage] f/u Dr.Tennenbaum [place].   INTERVAL HISTORY:  A very pleasant 83-year-old male patient with a prior history of long-standing leukocytosis and mild anemia is here for follow-up.  He is accompanied by his daughter.  Today he feels good. Denies any significant fatigue. His appetite is good. No weight loss. No night sweats no fevers. Denies any chest pain shortness of breath or cough.  No admission the hospital. He denies any swelling in the legs.  REVIEW OF SYSTEMS:  A complete 10 point review of system is done which is negative except mentioned above/history of present illness.   PAST MEDICAL HISTORY :  Past Medical History:  Diagnosis Date  . Arthritis   . [place]rial fibrillation ([place]   . CHF (congestive heart failure) ([place]   . COPD (chronic obstructive pulmonary disease) ([place]   . Dysrhythmia   . GERD (gastroesophageal reflux disease)   . Hyperlipemia   . Hypertension   . Leukocytosis 01/14/2016  . [place]state cancer ([place]     PAST SURGICAL HISTORY :   Past Surgical History:  Procedure Laterality Date  . CHOLECYSTECTOMY    . COLONOSCOPY WITH PROPOFOL N/A 04/04/2015   Procedure: COLONOSCOPY WITH PROPOFOL;  Surgeon: RManya Silvas MD;  [facility];  Service: Endoscopy;  Laterality: N/A;  .  ESOPHAGOGASTRODUODENOSCOPY N/A 04/04/2015   Procedure: ESOPHAGOGASTRODUODENOSCOPY (EGD);  Surgeon: RManya Silvas MD;  [facility];  Service: Endoscopy;  Laterality: N/A;    FAMILY HISTORY :   Family History  Problem Relation Age of Onset  . Hypertension Other     SOCIAL HISTORY:   Social History  Substance Use Topics  . Smoking status: Former Smoker    Years: 20.00    Types: Cigarettes  . Smokeless tobacco: Never Used  . Alcohol use 0.0 oz/week     Comment: Wine every once in a while    ALLERGIES:  has No Known Allergies.  MEDICATIONS:  Current Outpatient Prescriptions  Medication Sig Dispense Refill  . albuterol (PROVENTIL HFA;VENTOLIN HFA) 108 (90 Base) MCG/ACT inhaler Inhale 2 puffs into the lungs every 6 (six) hours as needed for wheezing or shortness of breath. 8 g 0  . BREO ELLIPTA 100-25 MCG/INH AEPB Inhale 1 puff into the lungs every morning.    . digoxin (LANOXIN) 0.125 MG tablet Take 0.125 mg by mouth every morning.     . ferrous sulfate 325 (65 FE) MG tablet Take 325 mg by mouth every morning. Reported on 54/25/9563   . folic acid (FOLVITE) 1 MG tablet Take 1 mg by mouth daily.   11  . furosemide (LASIX) 20 MG tablet Take 20 mg by mouth every other day. In the morning    . methotrexate (RHEUMATREX) 2.5 MG tablet TAKE 6 TABLETS (15 MG TOTAL) BY MOUTH EVERY 7 (SEVEN) DAYS.  4  . montelukast (SINGULAIR) 10 MG tablet Take 1 tablet by mouth at bedtime.     . Multiple Vitamins-Minerals (  PRESERVISION AREDS PO) Take 1 tablet by mouth every morning.     . warfarin (COUMADIN) 4 MG tablet Take 4 mg by mouth daily.     No current facility-administered medications for this visit.     PHYSICAL EXAMINATION: ECOG PERFORMANCE STATUS: \  BP 105/64 (BP Location: Right Arm, Patient Position: Sitting)   Pulse (!) 52   Temp 97.3 F (36.3 C) (Tympanic)   Resp 18   Wt 139 lb (63 kg)   BMI 23.86 kg/m   Filed Weights   02/16/17 1439  Weight: 139 lb (63 kg)     GENERAL: Well-nourished well-developed; Alert, no distress and comfortable.  Accompanied by his daughter EYES: no pallor or icterus OROPHARYNX: no thrush or ulceration; dentures.  NECK: supple, no masses felt LYMPH:  no palpable lymphadenopathy in the cervical, axillary or inguinal regions LUNGS: clear to auscultation and  No wheeze or crackles HEART/CVS:irregular rate & rhythm and no murmurs; No lower extremity edema ABDOMEN:abdomen soft, non-tender and normal bowel sounds Musculoskeletal:no cyanosis of digits and no clubbing  PSYCH: alert & oriented x 3 with fluent speech NEURO: no focal motor/sensory deficits SKIN:  no rashes or significant lesions  LABORATORY DATA:  I have reviewed the data as listed    Component Value Date/Time   NA 137 02/16/2017 1357   NA 140 01/22/2015 0345   K 3.7 02/16/2017 1357   K 3.7 01/22/2015 0345   CL 104 02/16/2017 1357   CL 106 01/22/2015 0345   CO2 25 02/16/2017 1357   CO2 25 01/22/2015 0345   GLUCOSE 107 (H) 02/16/2017 1357   GLUCOSE 89 01/22/2015 0345   BUN 24 (H) 02/16/2017 1357   BUN 16 01/22/2015 0345   CREATININE 1.11 02/16/2017 1357   CREATININE 0.88 01/22/2015 0345   CALCIUM 8.9 02/16/2017 1357   CALCIUM 7.7 (L) 01/22/2015 0345   PROT 7.4 02/16/2017 1357   PROT 6.8 01/21/2015 1946   ALBUMIN 3.8 02/16/2017 1357   ALBUMIN 3.3 (L) 01/21/2015 1946   AST 24 02/16/2017 1357   AST 20 01/21/2015 1946   ALT 13 (L) 02/16/2017 1357   ALT 6 (L) 01/21/2015 1946   ALKPHOS 92 02/16/2017 1357   ALKPHOS 86 01/21/2015 1946   BILITOT 0.8 02/16/2017 1357   BILITOT 0.9 01/21/2015 1946   GFRNONAA 59 (L) 02/16/2017 1357   GFRNONAA >60 01/22/2015 0345   GFRAA >60 02/16/2017 1357   GFRAA >60 01/22/2015 0345    No results found for: SPEP, UPEP  Lab Results  Component Value Date   WBC 22.7 (H) 02/16/2017   NEUTROABS 17.9 (H) 02/16/2017   HGB 10.4 (L) 02/16/2017   HCT 30.8 (L) 02/16/2017   MCV 97.4 02/16/2017   PLT 219 02/16/2017       Chemistry      Component Value Date/Time   NA 137 02/16/2017 1357   NA 140 01/22/2015 0345   K 3.7 02/16/2017 1357   K 3.7 01/22/2015 0345   CL 104 02/16/2017 1357   CL 106 01/22/2015 0345   CO2 25 02/16/2017 1357   CO2 25 01/22/2015 0345   BUN 24 (H) 02/16/2017 1357   BUN 16 01/22/2015 0345   CREATININE 1.11 02/16/2017 1357   CREATININE 0.88 01/22/2015 0345      Component Value Date/Time   CALCIUM 8.9 02/16/2017 1357   CALCIUM 7.7 (L) 01/22/2015 0345   ALKPHOS 92 02/16/2017 1357   ALKPHOS 86 01/21/2015 1946   AST 24 02/16/2017 1357   AST 20  01/21/2015 1946   ALT 13 (L) 02/16/2017 1357   ALT 6 (L) 01/21/2015 1946   BILITOT 0.8 02/16/2017 1357   BILITOT 0.9 01/21/2015 1946         ASSESSMENT & PLAN:   Myeloproliferative neoplasm ([place]) # Leukocytosis predominant neutrophilia- again unclear etiology- Likely myeloproliferative neoplasm.  Today his white count is 22,000 today; up from 14,000 approximately 2 months ago. No source of infection. Discussed that if his counts continue to get worse- a repeat bone marrow biopsy might be warranted. Also, discussed regarding use of hypomethylating agent- if this turns out to be CMML.  He and his daughter were not too keen on treatments at this time. Observation is reasonable.  # Anemia hemoglobin 10.7-likely secondary to above primary bone marrow process. Iron studies -no iron deficiency anemia. Stable. Monitor for now.   # /A.fib/CHF controlled- compensated.   # Prostate cancer early stage [follows up with urology; Dr.Tennenbaum; [place]]  # Follow-up in 3 months with labs. The above plan of care was discussed with the patient and his daughter in detail.  Cc; Dr.Khan   Cammie Sickle, MD 02/16/2017 7:13 PM

## 2017-02-22 ENCOUNTER — Ambulatory Visit (INDEPENDENT_AMBULATORY_CARE_PROVIDER_SITE_OTHER): Payer: Medicare Other

## 2017-02-22 ENCOUNTER — Encounter (INDEPENDENT_AMBULATORY_CARE_PROVIDER_SITE_OTHER): Payer: Self-pay | Admitting: Orthopaedic Surgery

## 2017-02-22 ENCOUNTER — Ambulatory Visit (INDEPENDENT_AMBULATORY_CARE_PROVIDER_SITE_OTHER): Payer: Medicare Other | Admitting: Orthopaedic Surgery

## 2017-02-22 DIAGNOSIS — G8929 Other chronic pain: Secondary | ICD-10-CM

## 2017-02-22 DIAGNOSIS — M25511 Pain in right shoulder: Secondary | ICD-10-CM | POA: Diagnosis not present

## 2017-02-22 NOTE — Progress Notes (Signed)
   Office Visit Note   Patient: Thomas Townsend           Date of Birth: 1930-10-20           MRN: [id number] Visit Date: 02/22/2017              Requested by: No referring provider defined for this encounter. PCP: Placido Sou, MD   Assessment & Plan: Visit Diagnoses:  1. Chronic right shoulder pain     Plan: Overall impression is rotator cuff syndrome. Home exercises were provided for the patient. I did offer her subacromial injection which he declined. Follow-up with me as needed.  Follow-Up Instructions: Return if symptoms worsen or fail to improve.   Orders:  Orders Placed This Encounter  Procedures  . XR Shoulder Right   No orders of the defined types were placed in this encounter.     Procedures: No procedures performed   Clinical Data: No additional findings.   Subjective: Chief Complaint  Patient presents with  . 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 Pain comes and goes. He is right-handed. He denies any radiation of pain. He denies any injuries.    Review of Systems  Constitutional: Negative.   All other systems reviewed and are negative.    Objective: Vital Signs: There were no vitals taken for this visit.  Physical Exam  Constitutional: He is oriented to person, place, and time. He appears well-developed and well-nourished.  HENT:  Head: Normocephalic and atraumatic.  Eyes: Pupils are equal, round, and reactive to light.  Neck: Neck supple.  Pulmonary/Chest: Effort normal.  Abdominal: Soft.  Musculoskeletal: Normal range of motion.  Neurological: He is alert and oriented to person, place, and time.  Skin: Skin is warm.  Psychiatric: He has a normal mood and affect. His behavior is normal. Judgment and thought content normal.  Nursing note and vitals reviewed.   Ortho Exam Right shoulder exam shows no  impingement signs. Infraspinatus and supraspinatus are weak and with mild pain. Negative cross adduction sign. Acromioclavicular joint is nontender. Specialty Comments:  No specialty comments available.  Imaging: Xr Shoulder Right  Result Date: 02/22/2017 No structural or acute abnormalities    PMFS History: There are no active problems to display for this patient.  No past medical history on file.  No family history on file.  No past surgical history on file. Social History   Occupational History  . Not on file.   Social History Main Topics  . Smoking status: Never Smoker  . Smokeless tobacco: Never Used  . Alcohol use Not on file  . Drug use: Unknown  . Sexual activity: Not on file

## 2017-02-23 ENCOUNTER — Ambulatory Visit: Payer: Self-pay | Admitting: Internal Medicine

## 2017-02-23 DIAGNOSIS — J189 Pneumonia, unspecified organism: Secondary | ICD-10-CM | POA: Diagnosis not present

## 2017-02-25 DIAGNOSIS — S70362A Insect bite (nonvenomous), left thigh, initial encounter: Secondary | ICD-10-CM | POA: Diagnosis not present

## 2017-02-25 DIAGNOSIS — R21 Rash and other nonspecific skin eruption: Secondary | ICD-10-CM | POA: Diagnosis not present

## 2017-03-01 ENCOUNTER — Encounter (INDEPENDENT_AMBULATORY_CARE_PROVIDER_SITE_OTHER): Payer: Medicare Other | Admitting: Ophthalmology

## 2017-03-01 DIAGNOSIS — H35341 Macular cyst, hole, or pseudohole, right eye: Secondary | ICD-10-CM | POA: Diagnosis not present

## 2017-03-01 DIAGNOSIS — H353221 Exudative age-related macular degeneration, left eye, with active choroidal neovascularization: Secondary | ICD-10-CM

## 2017-03-01 DIAGNOSIS — H43812 Vitreous degeneration, left eye: Secondary | ICD-10-CM

## 2017-03-01 DIAGNOSIS — H33301 Unspecified retinal break, right eye: Secondary | ICD-10-CM | POA: Diagnosis not present

## 2017-03-01 DIAGNOSIS — H353114 Nonexudative age-related macular degeneration, right eye, advanced atrophic with subfoveal involvement: Secondary | ICD-10-CM

## 2017-03-15 DIAGNOSIS — I480 Paroxysmal atrial fibrillation: Secondary | ICD-10-CM | POA: Diagnosis not present

## 2017-03-22 ENCOUNTER — Other Ambulatory Visit: Payer: Self-pay

## 2017-03-22 ENCOUNTER — Inpatient Hospital Stay (HOSPITAL_COMMUNITY)
Admission: EM | Admit: 2017-03-22 | Discharge: 2017-03-25 | DRG: 871 | Disposition: A | Discharge status: Home / Self Care | Payer: Medicare Other | Attending: Internal Medicine | Admitting: Internal Medicine

## 2017-03-22 ENCOUNTER — Other Ambulatory Visit (HOSPITAL_COMMUNITY): Payer: Self-pay

## 2017-03-22 ENCOUNTER — Encounter (HOSPITAL_COMMUNITY): Payer: Self-pay

## 2017-03-22 ENCOUNTER — Emergency Department (HOSPITAL_COMMUNITY): Payer: Medicare Other

## 2017-03-22 DIAGNOSIS — Z79899 Other long term (current) drug therapy: Secondary | ICD-10-CM

## 2017-03-22 DIAGNOSIS — A419 Sepsis, unspecified organism: Secondary | ICD-10-CM | POA: Diagnosis not present

## 2017-03-22 DIAGNOSIS — J449 Chronic obstructive pulmonary disease, unspecified: Secondary | ICD-10-CM | POA: Diagnosis not present

## 2017-03-22 DIAGNOSIS — Z8546 Personal history of malignant neoplasm of prostate: Secondary | ICD-10-CM

## 2017-03-22 DIAGNOSIS — Z8673 Personal history of transient ischemic attack (TIA), and cerebral infarction without residual deficits: Secondary | ICD-10-CM | POA: Diagnosis not present

## 2017-03-22 DIAGNOSIS — G9341 Metabolic encephalopathy: Secondary | ICD-10-CM | POA: Diagnosis not present

## 2017-03-22 DIAGNOSIS — R410 Disorientation, unspecified: Secondary | ICD-10-CM

## 2017-03-22 DIAGNOSIS — I5032 Chronic diastolic (congestive) heart failure: Secondary | ICD-10-CM | POA: Diagnosis not present

## 2017-03-22 DIAGNOSIS — Z87891 Personal history of nicotine dependence: Secondary | ICD-10-CM | POA: Diagnosis not present

## 2017-03-22 DIAGNOSIS — Z66 Do not resuscitate: Secondary | ICD-10-CM | POA: Diagnosis not present

## 2017-03-22 DIAGNOSIS — J181 Lobar pneumonia, unspecified organism: Secondary | ICD-10-CM

## 2017-03-22 DIAGNOSIS — M130 Polyarthritis, unspecified: Secondary | ICD-10-CM | POA: Diagnosis not present

## 2017-03-22 DIAGNOSIS — Z7901 Long term (current) use of anticoagulants: Secondary | ICD-10-CM | POA: Diagnosis not present

## 2017-03-22 DIAGNOSIS — E785 Hyperlipidemia, unspecified: Secondary | ICD-10-CM | POA: Diagnosis not present

## 2017-03-22 DIAGNOSIS — R402441 Other coma, without documented Glasgow coma scale score, or with partial score reported, in the field [EMT or ambulance]: Secondary | ICD-10-CM | POA: Diagnosis not present

## 2017-03-22 DIAGNOSIS — D500 Iron deficiency anemia secondary to blood loss (chronic): Secondary | ICD-10-CM | POA: Diagnosis not present

## 2017-03-22 DIAGNOSIS — G9349 Other encephalopathy: Secondary | ICD-10-CM | POA: Diagnosis not present

## 2017-03-22 DIAGNOSIS — Z9981 Dependence on supplemental oxygen: Secondary | ICD-10-CM | POA: Diagnosis not present

## 2017-03-22 DIAGNOSIS — C946 Myelodysplastic disease, not classified: Secondary | ICD-10-CM | POA: Diagnosis not present

## 2017-03-22 DIAGNOSIS — J690 Pneumonitis due to inhalation of food and vomit: Secondary | ICD-10-CM | POA: Diagnosis not present

## 2017-03-22 DIAGNOSIS — M069 Rheumatoid arthritis, unspecified: Secondary | ICD-10-CM | POA: Diagnosis not present

## 2017-03-22 DIAGNOSIS — Z8249 Family history of ischemic heart disease and other diseases of the circulatory system: Secondary | ICD-10-CM | POA: Diagnosis not present

## 2017-03-22 DIAGNOSIS — I110 Hypertensive heart disease with heart failure: Secondary | ICD-10-CM | POA: Diagnosis present

## 2017-03-22 DIAGNOSIS — K219 Gastro-esophageal reflux disease without esophagitis: Secondary | ICD-10-CM | POA: Diagnosis present

## 2017-03-22 DIAGNOSIS — J189 Pneumonia, unspecified organism: Secondary | ICD-10-CM | POA: Diagnosis not present

## 2017-03-22 DIAGNOSIS — D471 Chronic myeloproliferative disease: Secondary | ICD-10-CM | POA: Diagnosis not present

## 2017-03-22 DIAGNOSIS — I482 Chronic atrial fibrillation: Secondary | ICD-10-CM | POA: Diagnosis present

## 2017-03-22 DIAGNOSIS — B999 Unspecified infectious disease: Secondary | ICD-10-CM | POA: Diagnosis not present

## 2017-03-22 DIAGNOSIS — I4891 Unspecified atrial fibrillation: Secondary | ICD-10-CM | POA: Diagnosis not present

## 2017-03-22 DIAGNOSIS — R05 Cough: Secondary | ICD-10-CM | POA: Diagnosis not present

## 2017-03-22 DIAGNOSIS — J168 Pneumonia due to other specified infectious organisms: Secondary | ICD-10-CM | POA: Diagnosis not present

## 2017-03-22 LAB — DIFFERENTIAL
Basophils Absolute: 0 10*3/uL (ref 0.0–0.1)
Basophils Relative: 0 %
EOS ABS: 0.3 10*3/uL (ref 0.0–0.7)
Eosinophils Relative: 1 %
Lymphocytes Relative: 8 %
Lymphs Abs: 2.2 10*3/uL (ref 0.7–4.0)
MONOS PCT: 4 %
Monocytes Absolute: 1.1 10*3/uL — ABNORMAL HIGH (ref 0.1–1.0)
NEUTROS ABS: 24.1 10*3/uL — AB (ref 1.7–7.7)
NEUTROS PCT: 87 %

## 2017-03-22 LAB — PROTIME-INR
INR: 2.25
PROTHROMBIN TIME: 25.3 s — AB (ref 11.4–15.2)

## 2017-03-22 LAB — URINALYSIS, ROUTINE W REFLEX MICROSCOPIC
BILIRUBIN URINE: NEGATIVE
Glucose, UA: NEGATIVE mg/dL
Hgb urine dipstick: NEGATIVE
Ketones, ur: NEGATIVE mg/dL
LEUKOCYTES UA: NEGATIVE
NITRITE: NEGATIVE
PH: 6 (ref 5.0–8.0)
Protein, ur: NEGATIVE mg/dL
SPECIFIC GRAVITY, URINE: 1.016 (ref 1.005–1.030)

## 2017-03-22 LAB — I-STAT VENOUS BLOOD GAS, ED
ACID-BASE DEFICIT: 1 mmol/L (ref 0.0–2.0)
Bicarbonate: 23.3 mmol/L (ref 20.0–28.0)
O2 SAT: 75 %
PCO2 VEN: 34.6 mmHg — AB (ref 44.0–60.0)
PH VEN: 7.436 — AB (ref 7.250–7.430)
TCO2: 24 mmol/L (ref 0–100)
pO2, Ven: 38 mmHg (ref 32.0–45.0)

## 2017-03-22 LAB — I-STAT CHEM 8, ED
BUN: 19 mg/dL (ref 6–20)
CALCIUM ION: 1.08 mmol/L — AB (ref 1.15–1.40)
CHLORIDE: 101 mmol/L (ref 101–111)
Creatinine, Ser: 0.9 mg/dL (ref 0.61–1.24)
Glucose, Bld: 109 mg/dL — ABNORMAL HIGH (ref 65–99)
HEMATOCRIT: 33 % — AB (ref 39.0–52.0)
Hemoglobin: 11.2 g/dL — ABNORMAL LOW (ref 13.0–17.0)
POTASSIUM: 4 mmol/L (ref 3.5–5.1)
SODIUM: 137 mmol/L (ref 135–145)
TCO2: 24 mmol/L (ref 0–100)

## 2017-03-22 LAB — CBC
HCT: 34.2 % — ABNORMAL LOW (ref 39.0–52.0)
Hemoglobin: 10.6 g/dL — ABNORMAL LOW (ref 13.0–17.0)
MCH: 31.3 pg (ref 26.0–34.0)
MCHC: 31 g/dL (ref 30.0–36.0)
MCV: 100.9 fL — ABNORMAL HIGH (ref 78.0–100.0)
PLATELETS: 266 10*3/uL (ref 150–400)
RBC: 3.39 MIL/uL — AB (ref 4.22–5.81)
RDW: 17.4 % — AB (ref 11.5–15.5)
WBC: 27.7 10*3/uL — AB (ref 4.0–10.5)

## 2017-03-22 LAB — COMPREHENSIVE METABOLIC PANEL
ALBUMIN: 3.7 g/dL (ref 3.5–5.0)
ALK PHOS: 108 U/L (ref 38–126)
ALT: 13 U/L — ABNORMAL LOW (ref 17–63)
AST: 23 U/L (ref 15–41)
Anion gap: 8 (ref 5–15)
BUN: 18 mg/dL (ref 6–20)
CALCIUM: 8.7 mg/dL — AB (ref 8.9–10.3)
CHLORIDE: 102 mmol/L (ref 101–111)
CO2: 25 mmol/L (ref 22–32)
Creatinine, Ser: 1.01 mg/dL (ref 0.61–1.24)
GFR calc non Af Amer: >60 mL/min (ref >60)
GLUCOSE: 110 mg/dL — AB (ref 65–99)
POTASSIUM: 3.9 mmol/L (ref 3.5–5.1)
SODIUM: 135 mmol/L (ref 135–145)
Total Bilirubin: 0.6 mg/dL (ref 0.3–1.2)
Total Protein: 6.8 g/dL (ref 6.5–8.1)

## 2017-03-22 LAB — APTT: aPTT: 48 seconds — ABNORMAL HIGH (ref 24–36)

## 2017-03-22 LAB — TSH: TSH: 1.313 u[IU]/mL (ref 0.350–4.500)

## 2017-03-22 LAB — I-STAT TROPONIN, ED: Troponin i, poc: 0 ng/mL (ref 0.00–0.08)

## 2017-03-22 LAB — CK: CK TOTAL: 22 U/L — AB (ref 49–397)

## 2017-03-22 MED ORDER — WARFARIN - PHARMACIST DOSING INPATIENT
Freq: Every day | Status: DC
  Administered 2017-03-23 – 2017-03-24 (×2)

## 2017-03-22 MED ORDER — METRONIDAZOLE IN NACL 5-0.79 MG/ML-% IV SOLN
500.0000 mg | Freq: Three times a day (TID) | INTRAVENOUS | Status: DC
  Administered 2017-03-23: 500 mg via INTRAVENOUS
  Filled 2017-03-22: qty 100

## 2017-03-22 MED ORDER — FERROUS SULFATE 325 (65 FE) MG PO TABS
325.0000 mg | ORAL_TABLET | Freq: Every morning | ORAL | Status: DC
  Administered 2017-03-23 – 2017-03-25 (×3): 325 mg via ORAL
  Filled 2017-03-22 (×3): qty 1

## 2017-03-22 MED ORDER — DEXTROSE 5 % IV SOLN
500.0000 mg | Freq: Once | INTRAVENOUS | Status: DC
  Filled 2017-03-22: qty 500

## 2017-03-22 MED ORDER — DEXTROSE 5 % IV SOLN
1.0000 g | INTRAVENOUS | Status: DC
  Administered 2017-03-23 – 2017-03-24 (×2): 1 g via INTRAVENOUS
  Filled 2017-03-22 (×2): qty 10

## 2017-03-22 MED ORDER — ALBUTEROL SULFATE (2.5 MG/3ML) 0.083% IN NEBU: 2.5000 mg | INHALATION_SOLUTION | RESPIRATORY_TRACT | Status: DC | PRN

## 2017-03-22 MED ORDER — IPRATROPIUM-ALBUTEROL 0.5-2.5 (3) MG/3ML IN SOLN
3.0000 mL | RESPIRATORY_TRACT | Status: DC
  Administered 2017-03-23: 3 mL via RESPIRATORY_TRACT
  Filled 2017-03-22: qty 3

## 2017-03-22 MED ORDER — DM-GUAIFENESIN ER 30-600 MG PO TB12
1.0000 | ORAL_TABLET | Freq: Two times a day (BID) | ORAL | Status: DC
  Administered 2017-03-22 – 2017-03-25 (×6): 1 via ORAL
  Filled 2017-03-22 (×6): qty 1

## 2017-03-22 MED ORDER — PRESERVISION AREDS PO CAPS: 1.0000 | ORAL_CAPSULE | ORAL | Status: DC

## 2017-03-22 MED ORDER — METRONIDAZOLE IN NACL 5-0.79 MG/ML-% IV SOLN: 500.0000 mg | Freq: Once | INTRAVENOUS | Status: DC

## 2017-03-22 MED ORDER — MONTELUKAST SODIUM 10 MG PO TABS
10.0000 mg | ORAL_TABLET | Freq: Every day | ORAL | Status: DC
  Administered 2017-03-22 – 2017-03-24 (×3): 10 mg via ORAL
  Filled 2017-03-22 (×3): qty 1

## 2017-03-22 MED ORDER — DEXTROSE 5 % IV SOLN
1.0000 g | Freq: Once | INTRAVENOUS | Status: DC
  Administered 2017-03-22: 1 g via INTRAVENOUS
  Filled 2017-03-22: qty 10

## 2017-03-22 MED ORDER — ACETAMINOPHEN 650 MG RE SUPP
650.0000 mg | Freq: Once | RECTAL | Status: AC
  Administered 2017-03-22: 650 mg via RECTAL
  Filled 2017-03-22: qty 1

## 2017-03-22 MED ORDER — ACETAMINOPHEN 325 MG PO TABS
650.0000 mg | ORAL_TABLET | Freq: Four times a day (QID) | ORAL | Status: DC | PRN
  Administered 2017-03-23: 650 mg via ORAL
  Filled 2017-03-22: qty 2

## 2017-03-22 MED ORDER — ONDANSETRON HCL 4 MG/2ML IJ SOLN
4.0000 mg | Freq: Once | INTRAMUSCULAR | Status: AC
  Administered 2017-03-22: 4 mg via INTRAVENOUS

## 2017-03-22 MED ORDER — DEXTROSE 5 % IV SOLN: 1.0000 g | INTRAVENOUS | Status: DC

## 2017-03-22 MED ORDER — SODIUM CHLORIDE 0.9 % IV BOLUS (SEPSIS)
1500.0000 mL | Freq: Once | INTRAVENOUS | Status: AC
  Administered 2017-03-22: 1500 mL via INTRAVENOUS

## 2017-03-22 MED ORDER — SODIUM CHLORIDE 0.9 % IV SOLN
INTRAVENOUS | Status: DC
  Administered 2017-03-23: 02:00:00 via INTRAVENOUS

## 2017-03-22 MED ORDER — DEXTROSE 5 % IV SOLN
500.0000 mg | INTRAVENOUS | Status: DC
  Administered 2017-03-22 – 2017-03-24 (×3): 500 mg via INTRAVENOUS
  Filled 2017-03-22 (×3): qty 500

## 2017-03-22 MED ORDER — FOLIC ACID 1 MG PO TABS
1.0000 mg | ORAL_TABLET | Freq: Every day | ORAL | Status: DC
  Administered 2017-03-23 – 2017-03-25 (×3): 1 mg via ORAL
  Filled 2017-03-22 (×3): qty 1

## 2017-03-22 MED ORDER — DIGOXIN 125 MCG PO TABS
0.1250 mg | ORAL_TABLET | ORAL | Status: DC
  Administered 2017-03-23 – 2017-03-25 (×3): 0.125 mg via ORAL
  Filled 2017-03-22 (×3): qty 1

## 2017-03-22 MED ORDER — SODIUM CHLORIDE 0.9 % IV BOLUS (SEPSIS)
1000.0000 mL | Freq: Once | INTRAVENOUS | Status: AC
  Administered 2017-03-22: 1000 mL via INTRAVENOUS

## 2017-03-22 MED ORDER — ZOLPIDEM TARTRATE 5 MG PO TABS
5.0000 mg | ORAL_TABLET | Freq: Every evening | ORAL | Status: DC | PRN
  Administered 2017-03-23: 5 mg via ORAL
  Filled 2017-03-22: qty 1

## 2017-03-22 MED ORDER — ONDANSETRON HCL 4 MG/2ML IJ SOLN: 4.0000 mg | Freq: Three times a day (TID) | INTRAMUSCULAR | Status: DC | PRN

## 2017-03-22 MED ORDER — PROSIGHT PO TABS
1.0000 | ORAL_TABLET | Freq: Every day | ORAL | Status: DC
  Administered 2017-03-23 – 2017-03-25 (×3): 1 via ORAL
  Filled 2017-03-22 (×3): qty 1

## 2017-03-22 NOTE — ED Notes (Signed)
Updated rectal temp results given to Dr. Blaine Hamper. Advised Cefatriaxone is infusing at this time, MD changed start time to reflect previous order.

## 2017-03-22 NOTE — ED Triage Notes (Signed)
To bridge via EMS, code stroke.  To CT room #2 with neurologist at side.  LSN  farm hand took pt to his home at 12 noon pt was normal.  Onset 5pm SIL found pt in bed, curled up in ball, confused.  Pt not following commands at this time,weak.   Vomiting.  Zofran given in CT.

## 2017-03-22 NOTE — ED Notes (Signed)
Canceled Code Stroke per Dr. Cheral Marker

## 2017-03-22 NOTE — H&P (Signed)
History and Physical    Thomas Townsend ZOX:[id number] DOB: 1930/12/11 DOA: 03/22/2017  Referring MD/NP/PA:   PCP: Allyne Gee, MD   Patient coming from:  The patient is coming from home.  At baseline, pt is partially dependent for most of ADL.    Chief Complaint:cough, fever, AMS  HPI: Thomas Townsend is a 83 y.o. male with medical history significant of hypertension, hyperlipidemia, COPD, GERD, prostate cancer (s/p of radiation seed implant), dCHF, atrial fibrillation on Coumadin, rheumatoid arthritis on methotrexate, myeloproliferative neoplasm with chronic leukocytosis, who presents with cough and altered mental status.  Per patient's daughter, patient became confused at this afternoon. He does not have unilateral weakness, facial droop or slurred speech. He moves all extremities normally. Patient has cough without significant amount of mucus production. He seems to have mild shortness of breath and congestion, but no chest pain. Patient has fever and chills. Patient has nausea and vomited twice. Daughter states that pt may have aspirated when he had vomiting. No abdominal pain or diarrhea. Denies symptoms of UTI.   ED Course: pt was found to have WBC 27.7, INR 2.25, electrolytes renal function okay, negative urinalysis, temperature 103.8, oxygen saturation 92% on room air, soft blood pressure, chest x-ray bilateral infiltration for pneumonia, negative CT-head and negative CT of C-spine for acute abnormalities. Pt is admitted to telemetry bed as inpatient.  Review of Systems:   General: has fevers, chills, no changes in body weight, has poor appetite, has fatigue HEENT: no blurry vision, hearing changes or sore throat Respiratory: has dyspnea, coughing, no wheezing CV: no chest pain, no palpitations GI: has nausea, vomiting, no abdominal pain, diarrhea, constipation GU: no dysuria, burning on urination, increased urinary frequency, hematuria  Ext: no leg edema Neuro: no unilateral  weakness, numbness, or tingling, no vision change or hearing loss. Has confusion. Skin: no rash, no skin tear. MSK: No muscle spasm, no deformity, no limitation of range of movement in spin Heme: No easy bruising.  Travel history: No recent long distant travel.  Allergy: No Known Allergies  Past Medical History:  Diagnosis Date  . Arthritis   . Atrial fibrillation ([place])   . CHF (congestive heart failure) ([place])   . COPD (chronic obstructive pulmonary disease) ([place])   . Dysrhythmia   . GERD (gastroesophageal reflux disease)   . Hyperlipemia   . Hypertension   . Leukocytosis 01/14/2016  . Prostate cancer [facility])     Past Surgical History:  Procedure Laterality Date  . CHOLECYSTECTOMY    . COLONOSCOPY WITH PROPOFOL N/A 04/04/2015   Procedure: COLONOSCOPY WITH PROPOFOL;  Surgeon: Manya Silvas, MD;  [facility];  Service: Endoscopy;  Laterality: N/A;  . ESOPHAGOGASTRODUODENOSCOPY N/A 04/04/2015   Procedure: ESOPHAGOGASTRODUODENOSCOPY (EGD);  Surgeon: Manya Silvas, MD;  [facility];  Service: Endoscopy;  Laterality: N/A;    Social History:  reports that he has quit smoking. His smoking use included Cigarettes. He quit after 20.00 years of use. He has never used smokeless tobacco. He reports that he drinks alcohol. He reports that he does not use drugs.  Family History:  Family History  Problem Relation Age of Onset  . Hypertension Other      Prior to Admission medications   Medication Sig Start Date End Date Taking? Authorizing Provider  albuterol (PROVENTIL HFA;VENTOLIN HFA) 108 (90 Base) MCG/ACT inhaler Inhale 2 puffs into the lungs every 6 (six) hours as needed for wheezing or shortness of breath. 10/07/16   Sudini, Srikar,  MD  BREO ELLIPTA 100-25 MCG/INH AEPB Inhale 1 puff into the lungs every morning. 01/28/15   [provider]  digoxin (LANOXIN) 0.125 MG tablet Take 0.125 mg by mouth every morning.  02/22/14   [provider]    ferrous sulfate 325 (65 FE) MG tablet Take 325 mg by mouth every morning. Reported on 02/25/2016    [provider]  folic acid (FOLVITE) 1 MG tablet Take 1 mg by mouth daily.     [provider]  furosemide (LASIX) 20 MG tablet Take 20 mg by mouth every other day. In the morning    [provider]  methotrexate (RHEUMATREX) 2.5 MG tablet TAKE 6 TABLETS (15 MG TOTAL) BY MOUTH EVERY 7 (SEVEN) DAYS. 12/24/15   [provider]  montelukast (SINGULAIR) 10 MG tablet Take 1 tablet by mouth at bedtime.     [provider]  Multiple Vitamins-Minerals (PRESERVISION AREDS PO) Take 1 tablet by mouth every morning.     [provider]  warfarin (COUMADIN) 4 MG tablet Take 4 mg by mouth daily.    [provider]    Physical Exam: Vitals:   03/22/17 2000 03/22/17 2100 03/22/17 2130 03/22/17 2225  BP: 121/66 (!) 101/54 (!) 95/55   Pulse: 71 84 78   Resp: (!) 30 (!) 28 (!) 24   Temp:    (!) 101.2 F (38.4 C)  TempSrc:    Rectal  SpO2: 92% 95% 94%    General: Not in acute distress HEENT:       Eyes: PERRL, EOMI, no scleral icterus.       ENT: No discharge from the ears and nose, no pharynx injection, no tonsillar enlargement.        Neck: No JVD, no bruit, no mass felt. Heme: No neck lymph node enlargement. Cardiac: S1/S2, RRR, No murmurs, No gallops or rubs. Respiratory: No rales, wheezing, rhonchi or rubs. GI: Soft, nondistended, nontender, no rebound pain, no organomegaly, BS present. GU: No hematuria Ext: No pitting leg edema bilaterally. 2+DP/PT pulse bilaterally. Musculoskeletal: No joint deformities, No joint redness or warmth, no limitation of ROM in spin. Skin: No rashes.  Neuro: confused, but recognize her daughter, knows that he in in hospital and year 2018. cranial nerves II-XII grossly intact, moves all extremities normally.   Psych: Patient is not psychotic, no suicidal or hemocidal ideation.  Labs on Admission: I have  personally reviewed following labs and imaging studies  CBC:  Recent Labs Lab 03/22/17 1906 03/22/17 1915  WBC 27.7*  --   NEUTROABS 24.1*  --   HGB 10.6* 11.2*  HCT 34.2* 33.0*  MCV 100.9*  --   PLT 266  --    Basic Metabolic Panel:  Recent Labs Lab 03/22/17 1906 03/22/17 1915  NA 135 137  K 3.9 4.0  CL 102 101  CO2 25  --   GLUCOSE 110* 109*  BUN 18 19  CREATININE 1.01 0.90  CALCIUM 8.7*  --    GFR: CrCl cannot be calculated (Unknown ideal weight.). Liver Function Tests:  Recent Labs Lab 03/22/17 1906  AST 23  ALT 13*  ALKPHOS 108  BILITOT 0.6  PROT 6.8  ALBUMIN 3.7   No results for input(s): LIPASE, AMYLASE in the last 168 hours. No results for input(s): AMMONIA in the last 168 hours. Coagulation Profile:  Recent Labs Lab 03/22/17 1906  INR 2.25   Cardiac Enzymes:  Recent Labs Lab 03/22/17 2127  CKTOTAL 22*   BNP (  last 3 results) No results for input(s): PROBNP in the last 8760 hours. HbA1C: No results for input(s): HGBA1C in the last 72 hours. CBG: No results for input(s): GLUCAP in the last 168 hours. Lipid Profile: No results for input(s): CHOL, HDL, LDLCALC, TRIG, CHOLHDL, LDLDIRECT in the last 72 hours. Thyroid Function Tests: No results for input(s): TSH, T4TOTAL, FREET4, T3FREE, THYROIDAB in the last 72 hours. Anemia Panel: No results for input(s): VITAMINB12, FOLATE, FERRITIN, TIBC, IRON, RETICCTPCT in the last 72 hours. Urine analysis:    Component Value Date/Time   COLORURINE YELLOW 03/22/2017 2011   APPEARANCEUR CLEAR 03/22/2017 2011   LABSPEC 1.016 03/22/2017 2011   PHURINE 6.0 03/22/2017 2011   GLUCOSEU NEGATIVE 03/22/2017 2011   HGBUR NEGATIVE 03/22/2017 2011   [place] NEGATIVE 03/22/2017 2011   Ray NEGATIVE 03/22/2017 2011   PROTEINUR NEGATIVE 03/22/2017 2011   NITRITE NEGATIVE 03/22/2017 2011   LEUKOCYTESUR NEGATIVE 03/22/2017 2011   Sepsis Labs: @LABRCNTIP (procalcitonin:4,lacticidven:4) )No results  found for this or any previous visit (from the past 240 hour(s)).   Radiological Exams on Admission: Dg Chest Port 1 View  Result Date: 03/22/2017 CLINICAL DATA:  83 year old presenting with acute mental status changes and nausea and vomiting. EXAM: PORTABLE CHEST 1 VIEW COMPARISON:  10/05/2016, 07/13/2016 and earlier, including CT chest 06/24/2016. FINDINGS: Cardiac silhouette mildly enlarged. Thoracic aorta atherosclerotic, unchanged. Airspace consolidation involving both lung bases. Upper lungs remain clear. Pulmonary vascularity normal. No pleural effusions. IMPRESSION: Pneumonia involving both lung bases. Electronically Signed   By: Evangeline Dakin M.D.   On: 03/22/2017 20:13   Ct Head Code Stroke W/o Cm  Result Date: 03/22/2017 CLINICAL DATA:  Code stroke.  Altered mental status EXAM: CT HEAD WITHOUT CONTRAST TECHNIQUE: Contiguous axial images were obtained from the base of the skull through the vertex without intravenous contrast. COMPARISON:  Head CT 10/05/2016 FINDINGS: Brain: No mass lesion or acute hemorrhage. No focal hypoattenuation of the basal ganglia or cortex to indicate acute infarct old right basal ganglia lacunar infarct. Old right occipital infarct. No hydrocephalus or age advanced atrophy. Vascular: No hyperdense vessel. No advanced atherosclerotic calcification of the arteries at the skull base. Skull: Normal visualized skull base, calvarium and extracranial soft tissues. Sinuses/Orbits: No sinus fluid levels or advanced mucosal thickening. No mastoid effusion. Normal orbits. ASPECTS [facility] Stroke Program Early CT Score) - Ganglionic level infarction (caudate, lentiform nuclei, internal capsule, insula, M1-M3 cortex): 7 - Supraganglionic infarction (M4-M6 cortex): 3 Total score (0-10 with 10 being normal): 10 IMPRESSION: 1. No acute intracranial abnormality. 2. Old right occipital infarct. 3. ASPECTS is 10. Dr. Kerney Elbe was paged regarding these results at 7:27 p.m. on  03/22/2017. Electronically Signed   By: Ulyses Jarred M.D.   On: 03/22/2017 19:27     EKG: Independently reviewed.  Atrial fibrillation, QTC 338, nonspecific T-wave change.   Assessment/Plan Principal Problem:   CAP (community acquired pneumonia) Active Problems:   A-fib (HCC)   Chronic diastolic CHF (congestive heart failure) (HCC)   Arthritis or polyarthritis, rheumatoid (HCC)   Sepsis (HCC)   COPD (chronic obstructive pulmonary disease) (HCC)   Myeloproliferative neoplasm (HCC)   Iron deficiency anemia due to chronic blood loss   Acute metabolic encephalopathy   Aspiration pneumonia (HCC)   CAP vs. Aspiration pneumonia and sepsis: Patient has fever, leukocytosis, cough and chest x-ray findings of bilateral infiltration, consistent with CAP. Patient has possible aspiration pneumonia. He meets criteria for sepsis with leukocytosis, fever, tachypnea. Lactic acid is pending. Blood pressure is  soft, but hemodynamically stable currently.  - Will admit to telemetry bed as inpt - IV Rocephin and azithromycin, and flagyl - Mucinex for cough  - DuoNeb, albuterol Neb prn for SOB - Urine legionella and S. pneumococcal antigen - Follow up blood culture x2, sputum culture and respiratory virus panel - will get Procalcitonin and trend lactic acid level per sepsis protocol - IVF: 2.5L of NS bolus in ED, followed by 100 mL per hour of NS  - get SLP   COPD: no wheezing or rhonchi. Stable -Breathing treatment as above  Chronic diastolic CHF: 2-D echo on 08/08/15 showed EF of 55-60%. Patient does not have leg edema or JVD. CHF is compensated on admission. -Hold Lasix due to sepsis -Check BNP  Atrial Fibrillation: CHA2DS2-VASc Score is 4, needs oral anticoagulation. Patient is on Coumadin at home. INR is 2.25 on admission. Heart rate is well controlled. -continue digoxin and check digoxin level -Coumadin per pharmacy  Arthritis or polyarthritis, rheumatoid ([place]): stable. On methotrexate at  home -Hold methotrexate due to ongoing infection  Myeloproliferative neoplasm [facility]): Has a chronic leukocytosis. Patient has been followed up by oncologist, Dr. Marland KitchenB" in [place]. Patient refused chemotherapy -Follow up with oncologist  Iron deficiency anemia: Hemoglobin 10.6 -Continue iron supplement -Follow-up by CBC  Acute metabolic encephalopathy: Due to pneumonia and sepsis -frequent neurologic checks   DVT ppx: On coumadin Code Status: DNR (I discussed with patient in the presence of her daughter who is POA, and explained the meaning of CODE STATUS. Patient wants to be DNR) Family Communication: Yes, patient's    at bed side Disposition Plan:  Anticipate discharge back to previous home environment Consults called:  none Admission status: Inpatient/tele   Date of Service 03/22/2017    Ivor Costa [facility] Pager [phone]  If 7PM-7AM, please contact night-coverage [email or web address] Password [facility] 03/22/2017, 10:29 PM

## 2017-03-22 NOTE — Progress Notes (Signed)
ANTICOAGULATION CONSULT NOTE - Initial Consult  Pharmacy Consult for Coumadin Indication: atrial fibrillation  No Known Allergies  Patient Measurements: Height: 5\' 7"  (170.2 cm) Weight: 136 lb (61.7 kg) IBW/kg (Calculated) : 66.1  Vital Signs: Temp: 98.8 F (37.1 C) (06/26 2309) Temp Source: Oral (06/26 2309) BP: 96/52 (06/26 2309) Pulse Rate: 100 (06/26 2309)  Labs:  Recent Labs  03/22/17 1906 03/22/17 1915 03/22/17 2127  HGB 10.6* 11.2*  --   HCT 34.2* 33.0*  --   PLT 266  --   --   APTT 48*  --   --   LABPROT 25.3*  --   --   INR 2.25  --   --   CREATININE 1.01 0.90  --   CKTOTAL  --   --  22*    Estimated Creatinine Clearance: 54.3 mL/min (by C-G formula based on SCr of 0.9 mg/dL).   Medical History: Past Medical History:  Diagnosis Date  . Arthritis   . Atrial fibrillation ([place])   . CHF (congestive heart failure) ([place])   . COPD (chronic obstructive pulmonary disease) ([place])   . Dysrhythmia   . GERD (gastroesophageal reflux disease)   . Hyperlipemia   . Hypertension   . Leukocytosis 01/14/2016  . Prostate cancer ([place])     Medications:  Prescriptions Prior to Admission  Medication Sig Dispense Refill Last Dose  . albuterol (PROVENTIL HFA;VENTOLIN HFA) 108 (90 Base) MCG/ACT inhaler Inhale 2 puffs into the lungs every 6 (six) hours as needed for wheezing or shortness of breath. 8 g 0 Taking  . BREO ELLIPTA 100-25 MCG/INH AEPB Inhale 1 puff into the lungs every morning.   Taking  . digoxin (LANOXIN) 0.125 MG tablet Take 0.125 mg by mouth every morning.    Taking  . ferrous sulfate 325 (65 FE) MG tablet Take 325 mg by mouth every morning. Reported on 02/25/2016   Taking  . folic acid (FOLVITE) 1 MG tablet Take 1 mg by mouth daily.   11 Taking  . furosemide (LASIX) 20 MG tablet Take 20 mg by mouth every other day. In the morning   Taking  . methotrexate (RHEUMATREX) 2.5 MG tablet TAKE 6 TABLETS (15 MG TOTAL) BY MOUTH EVERY 7 (SEVEN) DAYS.  4 Taking  .  montelukast (SINGULAIR) 10 MG tablet Take 1 tablet by mouth at bedtime.    Taking  . Multiple Vitamins-Minerals (PRESERVISION AREDS PO) Take 1 tablet by mouth every morning.    Taking  . warfarin (COUMADIN) 4 MG tablet Take 4 mg by mouth daily.   Taking    Assessment: 83 y.o. male admitted with SOB, poss PNA, h/o Afib, to continue Coumadin.  Pt reports taking his dose today.  Goal of Therapy:  INR 2-3 Monitor platelets by anticoagulation protocol: Yes   Plan:  F/U daily INR  Soliyana Mcchristian, Bronson Curb 03/22/2017,11:24 PM

## 2017-03-22 NOTE — ED Provider Notes (Signed)
[facility] DEPT Provider Note   CSN: [id number] Arrival date & time: 03/22/17  1904     History   Chief Complaint Chief Complaint  Patient presents with  . Altered Mental Status    HPI Thomas Townsend is a 83 y.o. male.  83 yo M with a chief complaint of altered mental status. Last seen normal by the family about noon. Since then patient has been confused about the scenario and was having some difficulty with peripheral vision per EMS. Code stroke was called off post CT of the head. Level V caveat altered mental status.   The history is provided by the patient.  Illness  This is a new problem. The current episode started 6 to 12 hours ago. The problem occurs constantly. The problem has not changed since onset.Pertinent negatives include no chest pain, no abdominal pain, no headaches and no shortness of breath. Nothing aggravates the symptoms. Nothing relieves the symptoms. He has tried nothing for the symptoms. The treatment provided no relief.    Past Medical History:  Diagnosis Date  . Arthritis   . Atrial fibrillation ([place])   . CHF (congestive heart failure) ([place])   . COPD (chronic obstructive pulmonary disease) ([place])   . Dysrhythmia   . GERD (gastroesophageal reflux disease)   . Hyperlipemia   . Hypertension   . Leukocytosis 01/14/2016  . Prostate cancer [facility])     Patient Active Problem List   Diagnosis Date Noted  . Right upper lobe pneumonia ([place]) 10/05/2016  . Chronic neutrophilia 08/26/2016  . Iron deficiency anemia due to chronic blood loss 08/26/2016  . Myeloproliferative neoplasm ([place]) 06/25/2016  . Anemia due to other cause 06/25/2016  . Diarrhea 02/26/2016  . Leukocytosis 01/14/2016  . Septic shock ([place]) 08/11/2015  . Acidosis, lactic 08/11/2015  . Acute respiratory failure with hypoxia ([place]) 08/11/2015  . Encephalopathy, metabolic 37/62/8315  . Pneumonia 08/11/2015  . Pulmonary hypertension ([place]) 08/11/2015  . Mild aortic stenosis 08/11/2015    . Generalized weakness 08/11/2015  . Anemia of chronic disease 08/11/2015  . Sepsis ([place]) 08/06/2015  . Atrial fibrillation with rapid ventricular response ([place]) 08/06/2015  . CAP (community acquired pneumonia) 08/06/2015  . Rheumatoid arthritis involving multiple joints ([place]) 01/28/2015  . Arthritis or polyarthritis, rheumatoid ([place]) 01/28/2015  . A-fib ([place]) 03/04/2014  . CAFL (chronic airflow limitation) ([place]) 03/04/2014  . Acid reflux 03/04/2014  . Diastolic dysfunction 17/61/6073  . Atrial fibrillation ([place]) 03/04/2014  . Chronic obstructive pulmonary disease ([place]) 03/04/2014    Past Surgical History:  Procedure Laterality Date  . CHOLECYSTECTOMY    . COLONOSCOPY WITH PROPOFOL N/A 04/04/2015   Procedure: COLONOSCOPY WITH PROPOFOL;  Surgeon: Manya Silvas, MD;  Location: [facility] ENDOSCOPY;  Service: Endoscopy;  Laterality: N/A;  . ESOPHAGOGASTRODUODENOSCOPY N/A 04/04/2015   Procedure: ESOPHAGOGASTRODUODENOSCOPY (EGD);  Surgeon: Manya Silvas, MD;  Location[facility] ENDOSCOPY;  Service: Endoscopy;  Laterality: N/A;       Home Medications    Prior to Admission medications   Medication Sig Start Date End Date Taking? Authorizing Provider  albuterol (PROVENTIL HFA;VENTOLIN HFA) 108 (90 Base) MCG/ACT inhaler Inhale 2 puffs into the lungs every 6 (six) hours as needed for wheezing or shortness of breath. 10/07/16   Sudini, Srikar, MD  BREO ELLIPTA 100-25 MCG/INH AEPB Inhale 1 puff into the lungs every morning. 01/28/15   [provider]  digoxin (LANOXIN) 0.125 MG tablet Take 0.125 mg by mouth every morning.  02/22/14   [provider]  ferrous  sulfate 325 (65 FE) MG tablet Take 325 mg by mouth every morning. Reported on 02/25/2016    [provider]  folic acid (FOLVITE) 1 MG tablet Take 1 mg by mouth daily.     [provider]  furosemide (LASIX) 20 MG tablet Take 20 mg by mouth every other day. In the morning    [provider]  methotrexate  (RHEUMATREX) 2.5 MG tablet TAKE 6 TABLETS (15 MG TOTAL) BY MOUTH EVERY 7 (SEVEN) DAYS. 12/24/15   [provider]  montelukast (SINGULAIR) 10 MG tablet Take 1 tablet by mouth at bedtime.     [provider]  Multiple Vitamins-Minerals (PRESERVISION AREDS PO) Take 1 tablet by mouth every morning.     [provider]  warfarin (COUMADIN) 4 MG tablet Take 4 mg by mouth daily.    [provider]    Family History Family History  Problem Relation Age of Onset  . Hypertension Other     Social History Social History  Substance Use Topics  . Smoking status: Former Smoker    Years: 20.00    Types: Cigarettes  . Smokeless tobacco: Never Used  . Alcohol use 0.0 oz/week     Comment: Wine every once in a while     Allergies   Patient has no known allergies.   Review of Systems Review of Systems  Constitutional: Negative for chills and fever.  HENT: Negative for congestion and facial swelling.   Eyes: Negative for discharge and visual disturbance.  Respiratory: Negative for shortness of breath.   Cardiovascular: Negative for chest pain and palpitations.  Gastrointestinal: Negative for abdominal pain, diarrhea and vomiting.  Musculoskeletal: Negative for arthralgias and myalgias.  Skin: Negative for color change and rash.  Neurological: Positive for speech difficulty and weakness. Negative for tremors, syncope and headaches.  Psychiatric/Behavioral: Negative for confusion and dysphoric mood.     Physical Exam Updated Vital Signs BP 121/66   Pulse 71   Temp (!) 103.8 F (39.9 C) (Rectal)   Resp (!) 30   SpO2 92%   Physical Exam  Constitutional: He appears well-developed and well-nourished.  HENT:  Head: Normocephalic and atraumatic.  Eyes: EOM are normal. Pupils are equal, round, and reactive to light.  Neck: Normal range of motion. Neck supple. No JVD present.  Cardiovascular: Normal rate and regular rhythm.  Exam reveals no gallop and no  friction rub.   No murmur heard. Pulmonary/Chest: No respiratory distress. He has no wheezes.  Abdominal: He exhibits no distension and no mass. There is no tenderness. There is no guarding.  Musculoskeletal: Normal range of motion.  Equal strength in all 4 extremities  Neurological: He is alert.  Confused to scenario  Skin: No rash noted. No pallor.  Psychiatric: He has a normal mood and affect. His behavior is normal.  Nursing note and vitals reviewed.    ED Treatments / Results  Labs (all labs ordered are listed, but only abnormal results are displayed) Labs Reviewed  PROTIME-INR - Abnormal; Notable for the following:       Result Value   Prothrombin Time 25.3 (*)    All other components within normal limits  APTT - Abnormal; Notable for the following:    aPTT 48 (*)    All other components within normal limits  CBC - Abnormal; Notable for the following:    WBC 27.7 (*)    RBC 3.39 (*)    Hemoglobin 10.6 (*)    HCT 34.2 (*)  MCV 100.9 (*)    RDW 17.4 (*)    All other components within normal limits  DIFFERENTIAL - Abnormal; Notable for the following:    Neutro Abs 24.1 (*)    Monocytes Absolute 1.1 (*)    All other components within normal limits  COMPREHENSIVE METABOLIC PANEL - Abnormal; Notable for the following:    Glucose, Bld 110 (*)    Calcium 8.7 (*)    ALT 13 (*)    All other components within normal limits  I-STAT CHEM 8, ED - Abnormal; Notable for the following:    Glucose, Bld 109 (*)    Calcium, Ion 1.08 (*)    Hemoglobin 11.2 (*)    HCT 33.0 (*)    All other components within normal limits  CULTURE, BLOOD (ROUTINE X 2)  CULTURE, BLOOD (ROUTINE X 2)  URINALYSIS, ROUTINE W REFLEX MICROSCOPIC  BLOOD GAS, VENOUS  CK  TSH  T4  I-STAT TROPOININ, ED  CBG MONITORING, ED    EKG  EKG Interpretation None       Radiology Dg Chest Port 1 View  Result Date: 03/22/2017 CLINICAL DATA:  83 year old presenting with acute mental status changes and  nausea and vomiting. EXAM: PORTABLE CHEST 1 VIEW COMPARISON:  10/05/2016, 07/13/2016 and earlier, including CT chest 06/24/2016. FINDINGS: Cardiac silhouette mildly enlarged. Thoracic aorta atherosclerotic, unchanged. Airspace consolidation involving both lung bases. Upper lungs remain clear. Pulmonary vascularity normal. No pleural effusions. IMPRESSION: Pneumonia involving both lung bases. Electronically Signed   By: Evangeline Dakin M.D.   On: 03/22/2017 20:13   Ct Head Code Stroke W/o Cm  Result Date: 03/22/2017 CLINICAL DATA:  Code stroke.  Altered mental status EXAM: CT HEAD WITHOUT CONTRAST TECHNIQUE: Contiguous axial images were obtained from the base of the skull through the vertex without intravenous contrast. COMPARISON:  Head CT 10/05/2016 FINDINGS: Brain: No mass lesion or acute hemorrhage. No focal hypoattenuation of the basal ganglia or cortex to indicate acute infarct old right basal ganglia lacunar infarct. Old right occipital infarct. No hydrocephalus or age advanced atrophy. Vascular: No hyperdense vessel. No advanced atherosclerotic calcification of the arteries at the skull base. Skull: Normal visualized skull base, calvarium and extracranial soft tissues. Sinuses/Orbits: No sinus fluid levels or advanced mucosal thickening. No mastoid effusion. Normal orbits. ASPECTS [facility] Stroke Program Early CT Score) - Ganglionic level infarction (caudate, lentiform nuclei, internal capsule, insula, M1-M3 cortex): 7 - Supraganglionic infarction (M4-M6 cortex): 3 Total score (0-10 with 10 being normal): 10 IMPRESSION: 1. No acute intracranial abnormality. 2. Old right occipital infarct. 3. ASPECTS is 10. Dr. Kerney Elbe was paged regarding these results at 7:27 p.m. on 03/22/2017. Electronically Signed   By: Ulyses Jarred M.D.   On: 03/22/2017 19:27    Procedures Procedures (including critical care time)  Medications Ordered in ED Medications  cefTRIAXone (ROCEPHIN) 1 g in dextrose 5 % 50 mL  IVPB (not administered)  metroNIDAZOLE (FLAGYL) IVPB 500 mg (not administered)  azithromycin (ZITHROMAX) 500 mg in dextrose 5 % 250 mL IVPB (not administered)  sodium chloride 0.9 % bolus 1,000 mL (1,000 mLs Intravenous New Bag/Given 03/22/17 2027)  acetaminophen (TYLENOL) suppository 650 mg (650 mg Rectal Given 03/22/17 2027)  ondansetron (ZOFRAN) injection 4 mg (4 mg Intravenous Given 03/22/17 1910)     Initial Impression / Assessment and Plan / ED Course  I have reviewed the triage vital signs and the nursing notes.  Pertinent labs & imaging results that were available during my care of the patient  were reviewed by me and considered in my medical decision making (see chart for details).     83 yo M With a chief complaint of altered mental status. Initially arrived as a code stroke was called off by neurology. An have a temperature of 103 rectally. Chest x-ray with bilateral pneumonia. Discussed with family. Has a history of aspiration pneumonia. Will start on Rocephin and Flagyl and azithromycin.  The patients results and plan were reviewed and discussed.   Any x-rays performed were independently reviewed by myself.   Differential diagnosis were considered with the presenting HPI.  Medications  cefTRIAXone (ROCEPHIN) 1 g in dextrose 5 % 50 mL IVPB (not administered)  metroNIDAZOLE (FLAGYL) IVPB 500 mg (not administered)  azithromycin (ZITHROMAX) 500 mg in dextrose 5 % 250 mL IVPB (not administered)  sodium chloride 0.9 % bolus 1,000 mL (1,000 mLs Intravenous New Bag/Given 03/22/17 2027)  acetaminophen (TYLENOL) suppository 650 mg (650 mg Rectal Given 03/22/17 2027)  ondansetron (ZOFRAN) injection 4 mg (4 mg Intravenous Given 03/22/17 1910)    Vitals:   03/22/17 1932 03/22/17 1953 03/22/17 2000  BP: 113/60  121/66  Pulse: 77  71  Resp: (!) 23  (!) 30  Temp: 99.6 F (37.6 C) (!) 103.8 F (39.9 C)   TempSrc: Axillary Rectal   SpO2: 92%  92%    Final diagnoses:  Pneumonia of  both lower lobes due to infectious organism [facility])  Disorientation    Admission/ observation were discussed with the admitting physician, patient and/or family and they are comfortable with the plan.    Final Clinical Impressions(s) / ED Diagnoses   Final diagnoses:  Pneumonia of both lower lobes due to infectious organism [facility])  Disorientation    New Prescriptions New Prescriptions   No medications on file     Deno Etienne, DO 03/22/17 2100

## 2017-03-23 DIAGNOSIS — B999 Unspecified infectious disease: Secondary | ICD-10-CM

## 2017-03-23 DIAGNOSIS — I4891 Unspecified atrial fibrillation: Secondary | ICD-10-CM

## 2017-03-23 DIAGNOSIS — G9349 Other encephalopathy: Secondary | ICD-10-CM

## 2017-03-23 DIAGNOSIS — R410 Disorientation, unspecified: Secondary | ICD-10-CM

## 2017-03-23 LAB — RESPIRATORY PANEL BY PCR
ADENOVIRUS-RVPPCR: NOT DETECTED
Bordetella pertussis: NOT DETECTED
CHLAMYDOPHILA PNEUMONIAE-RVPPCR: NOT DETECTED
CORONAVIRUS 229E-RVPPCR: NOT DETECTED
CORONAVIRUS HKU1-RVPPCR: NOT DETECTED
CORONAVIRUS NL63-RVPPCR: NOT DETECTED
Coronavirus OC43: NOT DETECTED
Influenza A: NOT DETECTED
Influenza B: NOT DETECTED
Metapneumovirus: NOT DETECTED
Mycoplasma pneumoniae: NOT DETECTED
PARAINFLUENZA VIRUS 2-RVPPCR: NOT DETECTED
PARAINFLUENZA VIRUS 3-RVPPCR: NOT DETECTED
Parainfluenza Virus 1: NOT DETECTED
Parainfluenza Virus 4: NOT DETECTED
RHINOVIRUS / ENTEROVIRUS - RVPPCR: NOT DETECTED
Respiratory Syncytial Virus: NOT DETECTED

## 2017-03-23 LAB — STREP PNEUMONIAE URINARY ANTIGEN: Strep Pneumo Urinary Antigen: NEGATIVE

## 2017-03-23 LAB — BRAIN NATRIURETIC PEPTIDE: B Natriuretic Peptide: 198.6 pg/mL — ABNORMAL HIGH (ref 0.0–100.0)

## 2017-03-23 LAB — DIGOXIN LEVEL: DIGOXIN LVL: 1 ng/mL (ref 0.8–2.0)

## 2017-03-23 LAB — LACTIC ACID, PLASMA
LACTIC ACID, VENOUS: 2 mmol/L — AB (ref 0.5–1.9)
Lactic Acid, Venous: 1.4 mmol/L (ref 0.5–1.9)

## 2017-03-23 LAB — PROCALCITONIN: PROCALCITONIN: 0.26 ng/mL

## 2017-03-23 LAB — PROTIME-INR
INR: 2.58
PROTHROMBIN TIME: 28.2 s — AB (ref 11.4–15.2)

## 2017-03-23 LAB — GLUCOSE, CAPILLARY: Glucose-Capillary: 107 mg/dL — ABNORMAL HIGH (ref 65–99)

## 2017-03-23 MED ORDER — WARFARIN SODIUM 2 MG PO TABS
4.0000 mg | ORAL_TABLET | Freq: Every day | ORAL | Status: DC
  Administered 2017-03-23 – 2017-03-24 (×2): 4 mg via ORAL
  Filled 2017-03-23 (×2): qty 2

## 2017-03-23 MED ORDER — ALUM & MAG HYDROXIDE-SIMETH 200-200-20 MG/5ML PO SUSP: 30.0000 mL | Freq: Four times a day (QID) | ORAL | Status: DC | PRN

## 2017-03-23 MED ORDER — IPRATROPIUM-ALBUTEROL 0.5-2.5 (3) MG/3ML IN SOLN
3.0000 mL | Freq: Four times a day (QID) | RESPIRATORY_TRACT | Status: DC
  Administered 2017-03-23 – 2017-03-24 (×5): 3 mL via RESPIRATORY_TRACT
  Filled 2017-03-23 (×5): qty 3

## 2017-03-23 MED ORDER — METRONIDAZOLE IN NACL 5-0.79 MG/ML-% IV SOLN
500.0000 mg | Freq: Three times a day (TID) | INTRAVENOUS | Status: DC
  Administered 2017-03-23 – 2017-03-24 (×3): 500 mg via INTRAVENOUS
  Filled 2017-03-23 (×3): qty 100

## 2017-03-23 NOTE — Progress Notes (Addendum)
ANTICOAGULATION CONSULT NOTE - Initial Consult  Pharmacy Consult for Coumadin Indication: atrial fibrillation  No Known Allergies  Patient Measurements: Height: 5\' 7"  (170.2 cm) Weight: 138 lb 12.8 oz (63 kg) IBW/kg (Calculated) : 66.1  Vital Signs: Temp: 98 F (36.7 C) (06/27 0836) Temp Source: Oral (06/27 0836) BP: 98/50 (06/27 0836) Pulse Rate: 103 (06/27 0918)  Labs:  Recent Labs  03/22/17 1906 03/22/17 1915 03/22/17 2127 03/23/17 0204  HGB 10.6* 11.2*  --   --   HCT 34.2* 33.0*  --   --   PLT 266  --   --   --   APTT 48*  --   --   --   LABPROT 25.3*  --   --  28.2*  INR 2.25  --   --  2.58  CREATININE 1.01 0.90  --   --   CKTOTAL  --   --  22*  --     Estimated Creatinine Clearance: 55.4 mL/min (by C-G formula based on SCr of 0.9 mg/dL).   Assessment: 83 y.o. male admitted with SOB, poss PNA, on warfarin PTA for atrial fibrillation (CHA2DS2-VASc Score is 4). INR 2.58, CBC stable, started flagyl, which might affect coumadin dosing.  PTA warfarin dose 4 mg per med rec (med hx incomplete, but mostly ~ 4 mg/dose during previous hospitalization)  Goal of Therapy:  INR 2-3 Monitor platelets by anticoagulation protocol: Yes   Plan:  Coumadin 4mg  po daily Daily PT/INR  Maryanna Shape, PharmD, BCPS  Clinical Pharmacist  Pager: [phone]  03/23/2017,9:56 AM

## 2017-03-23 NOTE — Evaluation (Signed)
Clinical/Bedside Swallow Evaluation Patient Details  Name: Thomas Townsend MRN: [id number] Date of Birth: 04/30/1931  Today's Date: 03/23/2017 Time: SLP Start Time (ACUTE ONLY): 0845 SLP Stop Time (ACUTE ONLY): 0902 SLP Time Calculation (min) (ACUTE ONLY): 17 min  Past Medical History:  Past Medical History:  Diagnosis Date  . Arthritis   . Atrial fibrillation ([place])   . CHF (congestive heart failure) ([place])   . COPD (chronic obstructive pulmonary disease) ([place])   . Dysrhythmia   . GERD (gastroesophageal reflux disease)   . Hyperlipemia   . Hypertension   . Leukocytosis 01/14/2016  . Prostate cancer [facility])    Past Surgical History:  Past Surgical History:  Procedure Laterality Date  . CHOLECYSTECTOMY    . COLONOSCOPY WITH PROPOFOL N/A 04/04/2015   Procedure: COLONOSCOPY WITH PROPOFOL;  Surgeon: Manya Silvas, MD;  Location: [facility] ENDOSCOPY;  Service: Endoscopy;  Laterality: N/A;  . ESOPHAGOGASTRODUODENOSCOPY N/A 04/04/2015   Procedure: ESOPHAGOGASTRODUODENOSCOPY (EGD);  Surgeon: Manya Silvas, MD;  Location: [facility] ENDOSCOPY;  Service: Endoscopy;  Laterality: N/A;   HPI:  Pt is an 83 y.o. male with PMH of hypertension, hyperlipidemia, COPD, GERD, prostate cancer (s/p of radiation seed implant), dCHF, atrial fibrillation on Coumadin, rheumatoid arthritis on methotrexate, myeloproliferative neoplasm with chronic leukocytosis, who presented to ED 6/26 with cough and altered mental status, mild SOB and congestion. Daughter wondered if pt may have aspirated when vomiting. Pt had fever, leukocytosis, and CXR showed bilateral infiltrates consistent with PNA. Head CT showed diffuse atrophy and old right occipital infarct. Pt followed by SLP in 2016 with recommendations for dysphagia 1/ thin liquids following oral surgery. Bedside swallow eval ordered.   Assessment / Plan / Recommendation Clinical Impression  Pt coughed after taking sequential sips of thin liquid via straw (nearly entire cup).  When cued to take one sip at a time, no other overt s/s of aspiration noted. Pt alert and oriented this a.m.; seems that confusion has resolved. Pt denies any history of swallowing difficulty. Pt is at an increased risk of aspiration given current decreased respiratory status. Recommend initiating regular diet, thin liquids with intermittent supervision to ensure pt in upright position for meals, taking small bites/ 1 sip at a time. Meds whole with liquid; pt did describe some difficulty with large pills and may benefit from having larger pills whole in puree or crushed if possible. Will f/u x1 to ensure diet tolerance. SLP Visit Diagnosis: Dysphagia, unspecified (R13.10)    Aspiration Risk  Mild aspiration risk    Diet Recommendation Regular;Thin liquid   Liquid Administration via: Cup;Straw Medication Administration: Whole meds with liquid (large pills crushed in puree if needed) Supervision: Patient able to self feed;Intermittent supervision to cue for compensatory strategies Compensations: Slow rate;Small sips/bites Postural Changes: Seated upright at 90 degrees;Remain upright for at least 30 minutes after po intake    Other  Recommendations Oral Care Recommendations: Oral care BID   Follow up Recommendations None      Frequency and Duration min 1 x/week  1 week       Prognosis        Swallow Study   General HPI: Pt is an 83 y.o. male with PMH of hypertension, hyperlipidemia, COPD, GERD, prostate cancer (s/p of radiation seed implant), dCHF, atrial fibrillation on Coumadin, rheumatoid arthritis on methotrexate, myeloproliferative neoplasm with chronic leukocytosis, who presented to ED 6/26 with cough and altered mental status, mild SOB and congestion. Daughter wondered if pt may have aspirated when vomiting. Pt had  fever, leukocytosis, and CXR showed bilateral infiltrates consistent with PNA. Head CT showed diffuse atrophy and old right occipital infarct. Pt followed by SLP in 2016  with recommendations for dysphagia 1/ thin liquids following oral surgery. Bedside swallow eval ordered. Type of Study: Bedside Swallow Evaluation Previous Swallow Assessment:  (See HPI) Diet Prior to this Study: NPO Temperature Spikes Noted: Yes Respiratory Status: Nasal cannula History of Recent Intubation: No Behavior/Cognition: Alert;Cooperative;Pleasant mood Oral Cavity Assessment: Within Functional Limits Oral Cavity - Dentition: Dentures, not available Vision: Functional for self-feeding Self-Feeding Abilities: Able to feed self Patient Positioning: Upright in bed Baseline Vocal Quality: Normal Volitional Cough: Strong Volitional Swallow: Able to elicit    Oral/Motor/Sensory Function Overall Oral Motor/Sensory Function: Within functional limits   Ice Chips Ice chips: Not tested   Thin Liquid Thin Liquid: Impaired Presentation: Cup;Straw Pharyngeal  Phase Impairments: Cough - Immediate Other Comments:  (cough only x1 following initial sequential sips)    Nectar Thick Nectar Thick Liquid: Not tested   Honey Thick Honey Thick Liquid: Not tested   Puree Puree: Within functional limits   Solid   GO   Solid: Within functional limits        Thomas Townsend, [place], CCC-SLP 03/23/2017,9:10 AM [phone]

## 2017-03-23 NOTE — Consult Note (Signed)
   [facility] CM Inpatient Consult   03/23/2017  BAILEN GEFFRE Mar 05, 1931 [id number]  Patient was assessed for [facility] [facility] for community services with [facility]. Patient was previously active with [facility].  Met with patient at bedside regarding being restarted with [facility] services. Patient denies any needs for for transportation, medications, or resources.  He states he continues to drives, lives with his son, and endorses Dr. Devona Konig as his primary care provider. Explained [facility] Care Management for post hospital follow up and he declines states, "I don't need anything right now. I feel a little better today and don't see the need for all of that right now."    Of note, [facility] Care Management services does not replace or interfere with any services that are arranged by inpatient case management or social work. For additional questions or referrals please contact:  Natividad Brood, RN BSN [facility] Liaison  [phone] business mobile phone Toll free office [phone]

## 2017-03-23 NOTE — Discharge Instructions (Signed)

## 2017-03-23 NOTE — Consult Note (Signed)
Referring Physician: Dr. Tyrone Nine    Chief Complaint: TIA  HPI: Thomas Townsend is an 83 y.o. male who presents with acute onset of confusion. There was no lateralized weakness in association with this. Also without facial droop or slurred speech. Of note, he has had a cough, mild SOB, fever and chills. He also has had N/V and his daughter was worried that he may have aspirated. He was febrile with a temp of 103.8. His O2 sat on RA per EMS was 92%.   His INR was therapeutic at 2.25. WBC was 27.   His PMHx includes HTN, HLD, COPD, GERD, prostate CA, atrial fibrillation on Coumadin, RA, chronic leukocytosis secondary to myeloproliferative neoplasm and diastolic CHF.    Past Medical History:  Diagnosis Date  . Arthritis   . Atrial fibrillation ([place])   . CHF (congestive heart failure) ([place])   . COPD (chronic obstructive pulmonary disease) ([place])   . Dysrhythmia   . GERD (gastroesophageal reflux disease)   . Hyperlipemia   . Hypertension   . Leukocytosis 01/14/2016  . Prostate cancer [facility])     Past Surgical History:  Procedure Laterality Date  . CHOLECYSTECTOMY    . COLONOSCOPY WITH PROPOFOL N/A 04/04/2015   Procedure: COLONOSCOPY WITH PROPOFOL;  Surgeon: Manya Silvas, MD;  Location: [facility] ENDOSCOPY;  Service: Endoscopy;  Laterality: N/A;  . ESOPHAGOGASTRODUODENOSCOPY N/A 04/04/2015   Procedure: ESOPHAGOGASTRODUODENOSCOPY (EGD);  Surgeon: Manya Silvas, MD;  Location: [facility] ENDOSCOPY;  Service: Endoscopy;  Laterality: N/A;    Family History  Problem Relation Age of Onset  . Hypertension Other    Social History:  reports that he has quit smoking. His smoking use included Cigarettes. He quit after 20.00 years of use. He has never used smokeless tobacco. He reports that he drinks alcohol. He reports that he does not use drugs.  Allergies: No Known Allergies  Medications:  albuterol (PROVENTIL HFA;VENTOLIN HFA) 108 (90 Base) MCG/ACT inhaler Inhale 2 puffs into the lungs every 6 (six)  hours as needed for wheezing or shortness of breath. Ivor Costa, MD Not Ordered  BREO ELLIPTA 100-25 MCG/INH AEPB Inhale 1 puff into the lungs every morning. Ivor Costa, MD Not Ordered  digoxin (LANOXIN) 0.125 MG tablet Take 0.125 mg by mouth every morning.  Ivor Costa, MD Reordered  Orderedas:digoxin [facility]) tablet 0.125 mg - 0.125 mg, Oral, BH-each morning, First dose on Wed 03/23/17 at 1000  ferrous sulfate 325 (65 FE) MG tablet Take 325 mg by mouth every morning. Reported on 02/25/2016 Ivor Costa, MD Reordered  Orderedas:ferrous sulfate tablet 325 mg - 325 mg, Oral, Every morning - 10a, First dose on Wed 9/74/16 at 3845  folic acid (FOLVITE) 1 MG tablet Take 1 mg by mouth daily.  Ivor Costa, MD Reordered  Orderedas:folic acid [facility]) tablet 1 mg - 1 mg, Oral, Daily, First dose on Wed 03/23/17 at 1000  furosemide (LASIX) 20 MG tablet Take 20 mg by mouth every other day. In the morning Ivor Costa, MD Not Ordered  methotrexate (RHEUMATREX) 2.5 MG tablet TAKE 6 TABLETS (15 MG TOTAL) BY MOUTH EVERY 7 (SEVEN) DAYS. Ivor Costa, MD Not Ordered  montelukast (SINGULAIR) 10 MG tablet Take 1 tablet by mouth at bedtime.  Ivor Costa, MD Reordered  Orderedas:montelukast [facility]) tablet 10 mg - 10 mg, Oral, Daily at bedtime, First dose on Tue 03/22/17 at 2300  Multiple Vitamins-Minerals (PRESERVISION AREDS PO) Take 1 tablet by mouth every morning.  Ivor Costa, MD Not Ordered  Orderedas:PRESERVISION AREDS CAPS  1 capsule - 1 capsule, Oral, BH-each morning, First dose on Wed 03/23/17 at 0700 (Discontinued)  warfarin (COUMADIN) 4 MG tablet Take 4 mg by mouth daily. Ivor Costa, MD Not Ordered    ROS: As per HPI.   Physical Examination: Blood pressure (!) 96/52, pulse 100, temperature 98.8 F (37.1 C), temperature source Oral, resp. rate 20, height '5\' 7"'  (1.702 m), weight 61.7 kg (136 lb), SpO2 95 %.  HEENT: [place]/AT Lungs: Tachypneic Ext: No cyanosis or pallor  Neurologic Examination: Mental  Status: Awake. Poor attention. Moderate confusion with increased latency of verbal responses. Limited speech output is fluent with intact comprehension of about 50% of simple verbal commands. Unable to correctly respond to complex questions and commands.  Cranial Nerves: II:  Visual fields grossly normal, PERRL III,IV, VI: ptosis not present, EOMI without nystagmus V,VII: smile symmetric, reacts to facial stimulation bilaterally VIII: hearing intact to voice IX,X: no hoarseness  XI: symmetric XII: midline tongue extension  Motor: Right : Upper extremity   5/5 to noxious  Left:     Upper extremity   5/5 to noxious  Lower extremity   4/5     Lower extremity   4/5 Sensory: Reacts to noxious stimuli x 4 Deep Tendon Reflexes:  No hyperreflexia noted.  Plantars: Equivocal bilaterally in context of forceful withdrawal/thrashing Cerebellar: No gross ataxia noted Gait: Deferred due to acuity of presentation  Results for orders placed or performed during the hospital encounter of 03/22/17 (from the past 48 hour(s))  Protime-INR     Status: Abnormal   Collection Time: 03/22/17  7:06 PM  Result Value Ref Range   Prothrombin Time 25.3 (H) 11.4 - 15.2 seconds   INR 2.25   APTT     Status: Abnormal   Collection Time: 03/22/17  7:06 PM  Result Value Ref Range   aPTT 48 (H) 24 - 36 seconds    Comment:        IF BASELINE aPTT IS ELEVATED, SUGGEST PATIENT RISK ASSESSMENT BE USED TO DETERMINE APPROPRIATE ANTICOAGULANT THERAPY.   CBC     Status: Abnormal   Collection Time: 03/22/17  7:06 PM  Result Value Ref Range   WBC 27.7 (H) 4.0 - 10.5 K/uL   RBC 3.39 (L) 4.22 - 5.81 MIL/uL   Hemoglobin 10.6 (L) 13.0 - 17.0 g/dL   HCT 34.2 (L) 39.0 - 52.0 %   MCV 100.9 (H) 78.0 - 100.0 fL   MCH 31.3 26.0 - 34.0 pg   MCHC 31.0 30.0 - 36.0 g/dL   RDW 17.4 (H) 11.5 - 15.5 %   Platelets 266 150 - 400 K/uL  Differential     Status: Abnormal   Collection Time: 03/22/17  7:06 PM  Result Value Ref Range    Neutrophils Relative % 87 %   Lymphocytes Relative 8 %   Monocytes Relative 4 %   Eosinophils Relative 1 %   Basophils Relative 0 %   Neutro Abs 24.1 (H) 1.7 - 7.7 K/uL   Lymphs Abs 2.2 0.7 - 4.0 K/uL   Monocytes Absolute 1.1 (H) 0.1 - 1.0 K/uL   Eosinophils Absolute 0.3 0.0 - 0.7 K/uL   Basophils Absolute 0.0 0.0 - 0.1 K/uL  Comprehensive metabolic panel     Status: Abnormal   Collection Time: 03/22/17  7:06 PM  Result Value Ref Range   Sodium 135 135 - 145 mmol/L   Potassium 3.9 3.5 - 5.1 mmol/L   Chloride 102 101 - 111 mmol/L  CO2 25 22 - 32 mmol/L   Glucose, Bld 110 (H) 65 - 99 mg/dL   BUN 18 6 - 20 mg/dL   Creatinine, Ser 1.01 0.61 - 1.24 mg/dL   Calcium 8.7 (L) 8.9 - 10.3 mg/dL   Total Protein 6.8 6.5 - 8.1 g/dL   Albumin 3.7 3.5 - 5.0 g/dL   AST 23 15 - 41 U/L   ALT 13 (L) 17 - 63 U/L   Alkaline Phosphatase 108 38 - 126 U/L   Total Bilirubin 0.6 0.3 - 1.2 mg/dL   GFR calc non Af Amer >60 >60 mL/min   GFR calc Af Amer >60 >60 mL/min    Comment: (NOTE) The eGFR has been calculated using the CKD EPI equation. This calculation has not been validated in all clinical situations. eGFR's persistently <60 mL/min signify possible Chronic Kidney Disease.    Anion gap 8 5 - 15  I-stat troponin, ED     Status: None   Collection Time: 03/22/17  7:13 PM  Result Value Ref Range   Troponin i, poc 0.00 0.00 - 0.08 ng/mL   Comment 3            Comment: Due to the release kinetics of cTnI, a negative result within the first hours of the onset of symptoms does not rule out myocardial infarction with certainty. If myocardial infarction is still suspected, repeat the test at appropriate intervals.   I-Stat Chem 8, ED     Status: Abnormal   Collection Time: 03/22/17  7:15 PM  Result Value Ref Range   Sodium 137 135 - 145 mmol/L   Potassium 4.0 3.5 - 5.1 mmol/L   Chloride 101 101 - 111 mmol/L   BUN 19 6 - 20 mg/dL   Creatinine, Ser 0.90 0.61 - 1.24 mg/dL   Glucose, Bld 109 (H)  65 - 99 mg/dL   Calcium, Ion 1.08 (L) 1.15 - 1.40 mmol/L   TCO2 24 0 - 100 mmol/L   Hemoglobin 11.2 (L) 13.0 - 17.0 g/dL   HCT 33.0 (L) 39.0 - 52.0 %  Urinalysis, Routine w reflex microscopic     Status: None   Collection Time: 03/22/17  8:11 PM  Result Value Ref Range   Color, Urine YELLOW YELLOW   APPearance CLEAR CLEAR   Specific Gravity, Urine 1.016 1.005 - 1.030   pH 6.0 5.0 - 8.0   Glucose, UA NEGATIVE NEGATIVE mg/dL   Hgb urine dipstick NEGATIVE NEGATIVE   Bilirubin Urine NEGATIVE NEGATIVE   Ketones, ur NEGATIVE NEGATIVE mg/dL   Protein, ur NEGATIVE NEGATIVE mg/dL   Nitrite NEGATIVE NEGATIVE   Leukocytes, UA NEGATIVE NEGATIVE  CK     Status: Abnormal   Collection Time: 03/22/17  9:27 PM  Result Value Ref Range   Total CK 22 (L) 49 - 397 U/L  TSH     Status: None   Collection Time: 03/22/17  9:27 PM  Result Value Ref Range   TSH 1.313 0.350 - 4.500 uIU/mL    Comment: Performed by a 3rd Generation assay with a functional sensitivity of <=0.01 uIU/mL.  I-Stat venous blood gas, ED     Status: Abnormal   Collection Time: 03/22/17  9:38 PM  Result Value Ref Range   pH, Ven 7.436 (H) 7.250 - 7.430   pCO2, Ven 34.6 (L) 44.0 - 60.0 mmHg   pO2, Ven 38.0 32.0 - 45.0 mmHg   Bicarbonate 23.3 20.0 - 28.0 mmol/L   TCO2 24 0 - 100 mmol/L  O2 Saturation 75.0 %   Acid-base deficit 1.0 0.0 - 2.0 mmol/L   Patient temperature HIDE    Sample type VENOUS    Comment NOTIFIED PHYSICIAN   Brain natriuretic peptide     Status: Abnormal   Collection Time: 03/22/17 11:25 PM  Result Value Ref Range   B Natriuretic Peptide 198.6 (H) 0.0 - 100.0 pg/mL  Digoxin level     Status: None   Collection Time: 03/22/17 11:25 PM  Result Value Ref Range   Digoxin Level 1.0 0.8 - 2.0 ng/mL  Lactic acid, plasma     Status: Abnormal   Collection Time: 03/22/17 11:25 PM  Result Value Ref Range   Lactic Acid, Venous 2.0 (HH) 0.5 - 1.9 mmol/L    Comment: CRITICAL RESULT CALLED TO, READ BACK BY AND  VERIFIED WITH: MARTIN S,RN 03/23/17 0028 WAYK   Procalcitonin     Status: None   Collection Time: 03/22/17 11:25 PM  Result Value Ref Range   Procalcitonin 0.26 ng/mL    Comment:        Interpretation: PCT (Procalcitonin) <= 0.5 ng/mL: Systemic infection (sepsis) is not likely. Local bacterial infection is possible. (NOTE)         ICU PCT Algorithm               Non ICU PCT Algorithm    ----------------------------     ------------------------------         PCT < 0.25 ng/mL                 PCT < 0.1 ng/mL     Stopping of antibiotics            Stopping of antibiotics       strongly encouraged.               strongly encouraged.    ----------------------------     ------------------------------       PCT level decrease by               PCT < 0.25 ng/mL       >= 80% from peak PCT       OR PCT 0.25 - 0.5 ng/mL          Stopping of antibiotics                                             encouraged.     Stopping of antibiotics           encouraged.    ----------------------------     ------------------------------       PCT level decrease by              PCT >= 0.25 ng/mL       < 80% from peak PCT        AND PCT >= 0.5 ng/mL            Continuin g antibiotics                                              encouraged.       Continuing antibiotics            encouraged.    ----------------------------     ------------------------------  PCT level increase compared          PCT > 0.5 ng/mL         with peak PCT AND          PCT >= 0.5 ng/mL             Escalation of antibiotics                                          strongly encouraged.      Escalation of antibiotics        strongly encouraged.   Protime-INR     Status: Abnormal   Collection Time: 03/23/17  2:04 AM  Result Value Ref Range   Prothrombin Time 28.2 (H) 11.4 - 15.2 seconds   INR 2.58    Dg Chest Port 1 View  Result Date: 03/22/2017 CLINICAL DATA:  83 year old presenting with acute mental status changes and  nausea and vomiting. EXAM: PORTABLE CHEST 1 VIEW COMPARISON:  10/05/2016, 07/13/2016 and earlier, including CT chest 06/24/2016. FINDINGS: Cardiac silhouette mildly enlarged. Thoracic aorta atherosclerotic, unchanged. Airspace consolidation involving both lung bases. Upper lungs remain clear. Pulmonary vascularity normal. No pleural effusions. IMPRESSION: Pneumonia involving both lung bases. Electronically Signed   By: Evangeline Dakin M.D.   On: 03/22/2017 20:13   Ct Head Code Stroke W/o Cm  Result Date: 03/22/2017 CLINICAL DATA:  Code stroke.  Altered mental status EXAM: CT HEAD WITHOUT CONTRAST TECHNIQUE: Contiguous axial images were obtained from the base of the skull through the vertex without intravenous contrast. COMPARISON:  Head CT 10/05/2016 FINDINGS: Brain: No mass lesion or acute hemorrhage. No focal hypoattenuation of the basal ganglia or cortex to indicate acute infarct old right basal ganglia lacunar infarct. Old right occipital infarct. No hydrocephalus or age advanced atrophy. Vascular: No hyperdense vessel. No advanced atherosclerotic calcification of the arteries at the skull base. Skull: Normal visualized skull base, calvarium and extracranial soft tissues. Sinuses/Orbits: No sinus fluid levels or advanced mucosal thickening. No mastoid effusion. Normal orbits. ASPECTS [facility] Stroke Program Early CT Score) - Ganglionic level infarction (caudate, lentiform nuclei, internal capsule, insula, M1-M3 cortex): 7 - Supraganglionic infarction (M4-M6 cortex): 3 Total score (0-10 with 10 being normal): 10 IMPRESSION: 1. No acute intracranial abnormality. 2. Old right occipital infarct. 3. ASPECTS is 10. Dr. Kerney Elbe was paged regarding these results at 7:27 p.m. on 03/22/2017. Electronically Signed   By: Ulyses Jarred M.D.   On: 03/22/2017 19:27    Assessment: 83 y.o. male presenting with acute confusion 1. Neurological exam non-focal. Mild confusion noted. 2. CT head shows diffuse atrophy.  Old right occipital infarction is noted. No acute abnormality seen.  Plan: 1. Evaluation/management for probable infection 2. Continue Coumadin and monitor INR.  3. Telemetry monitoring  '@Electronically'  signed: Dr. Kerney Elbe  03/23/2017, 3:08 AM

## 2017-03-23 NOTE — Progress Notes (Signed)
Midlevel informed of elevated lactic acid of 2.0. Will continue to monitor.

## 2017-03-23 NOTE — Progress Notes (Signed)
Patient ID: Thomas Townsend, male   DOB: 1930/11/19, 83 y.o.   MRN: [id number]                                                                PROGRESS NOTE                                                                                                                                                                                                             Patient Demographics:    Thomas Townsend, is a 83 y.o. male, DOB - 12/02/1930, NWG:[id number]  Admit date - 03/22/2017   Admitting Physician Ivor Costa, MD  Outpatient Primary MD for the patient is Allyne Gee, MD  LOS - 1  Outpatient Specialists:    Chief Complaint  Patient presents with  . Altered Mental Status       Brief Narrative   83 y.o. male with medical history significant of hypertension, hyperlipidemia, COPD, GERD, prostate cancer (s/p of radiation seed implant), dCHF, atrial fibrillation on Coumadin, rheumatoid arthritis on methotrexate, myeloproliferative neoplasm with chronic leukocytosis, who presents with cough and altered mental status.  Per patient's daughter, patient became confused at this afternoon. He does not have unilateral weakness, facial droop or slurred speech. He moves all extremities normally. Patient has cough without significant amount of mucus production. He seems to have mild shortness of breath and congestion, but no chest pain. Patient has fever and chills. Patient has nausea and vomited twice. Daughter states that pt may have aspirated when he had vomiting. No abdominal pain or diarrhea. Denies symptoms of UTI.   ED Course: pt was found to have WBC 27.7, INR 2.25, electrolytes renal function okay, negative urinalysis, temperature 103.8, oxygen saturation 92% on room air, soft blood pressure, chest x-ray bilateral infiltration for pneumonia, negative CT-head and negative CT of C-spine for acute abnormalities. Pt is admitted to telemetry bed as inpatient.   Subjective:    [name] today feels  like breathing slightly better. , No headache, No chest pain, No abdominal pain - No Nausea, No new weakness tingling or numbness, No Cough - SOB.    Assessment  & Plan :    Principal Problem:   CAP (community acquired pneumonia) Active Problems:   A-fib (HCC)   Chronic diastolic CHF (congestive heart  failure) ([place])   Arthritis or polyarthritis, rheumatoid (HCC)   Sepsis (HCC)   COPD (chronic obstructive pulmonary disease) (HCC)   Myeloproliferative neoplasm (HCC)   Iron deficiency anemia due to chronic blood loss   Acute metabolic encephalopathy   Aspiration pneumonia (HCC)   CAP vs. Aspiration pneumonia and sepsis: Patient has fever, leukocytosis, cough and chest x-ray findings of bilateral infiltration, consistent with CAP. Patient has possible aspiration pneumonia. He meets criteria for sepsis with leukocytosis, fever, tachypnea. Lactic acid is pending. Blood pressure is soft, but hemodynamically stable currently.  - Will admit to telemetry bed as inpt - IV Rocephin and azithromycin, and flagyl - Mucinex for cough  - DuoNeb, albuterol Neb prn for SOB - Urine legionella and S. pneumococcal antigen - Follow up blood culture x2, sputum culture and respiratory virus panel - will get Procalcitonin and trend lactic acid level per sepsis protocol - IVF: 2.5L of NS bolus in ED, followed by 100 mL per hour of NS  - get SLP   COPD: no wheezing or rhonchi. Stable -Breathing treatment as above  Chronic diastolic CHF: 2-D echo on 08/08/15 showed EF of 55-60%. Patient does not have leg edema or JVD. CHF is compensated on admission. -Hold Lasix due to sepsis -Check BNP  Atrial Fibrillation: CHA2DS2-VASc Score is 4, needs oral anticoagulation. Patient is on Coumadin at home. INR is 2.25 on admission. Heart rate is well controlled. -continue digoxin and check digoxin level -Coumadin per pharmacy  Arthritis or polyarthritis, rheumatoid ([place]): stable. On methotrexate at home -Hold  methotrexate due to ongoing infection  Myeloproliferative neoplasm [facility]): Has a chronic leukocytosis. Patient has been followed up by oncologist, Dr. Marland KitchenB" in [place] [facility] [place]. Patient refused chemotherapy -Follow up with oncologist  Iron deficiency anemia: Hemoglobin 10.6 -Continue iron supplement -Follow-up by CBC  Acute metabolic encephalopathy: Due to pneumonia and sepsis -frequent neurologic checks   DVT ppx: On coumadin Code Status: DNR  Family Communication: Yes, patient's    at bed side Disposition Plan:  Anticipate discharge back to previous home environment Consults called:  none Admission status: Inpatient/tele     Lab Results  Component Value Date   PLT 266 03/22/2017    Antibiotics  :   Anti-infectives    Start     Dose/Rate Route Frequency Ordered Stop   03/23/17 2300  cefTRIAXone (ROCEPHIN) 1 g in dextrose 5 % 50 mL IVPB  Status:  Discontinued     1 g 100 mL/hr over 30 Minutes Intravenous Every 24 hours 03/22/17 2239 03/22/17 2258   03/23/17 2200  cefTRIAXone (ROCEPHIN) 1 g in dextrose 5 % 50 mL IVPB  Status:  Discontinued     1 g 100 mL/hr over 30 Minutes Intravenous Every 24 hours 03/22/17 2206 03/22/17 2239   03/23/17 2200  cefTRIAXone (ROCEPHIN) 1 g in dextrose 5 % 50 mL IVPB     1 g 100 mL/hr over 30 Minutes Intravenous Every 24 hours 03/22/17 2258 03/29/17 2159   03/23/17 0900  metroNIDAZOLE (FLAGYL) IVPB 500 mg     500 mg 100 mL/hr over 60 Minutes Intravenous Every 8 hours 03/23/17 0056     03/22/17 2300  azithromycin (ZITHROMAX) 500 mg in dextrose 5 % 250 mL IVPB     500 mg 250 mL/hr over 60 Minutes Intravenous Every 24 hours 03/22/17 2206 03/29/17 2159   03/22/17 2300  metroNIDAZOLE (FLAGYL) IVPB 500 mg  Status:  Discontinued     500 mg 100 mL/hr over 60 Minutes  Intravenous Every 8 hours 03/22/17 2206 03/23/17 0056   03/22/17 2100  cefTRIAXone (ROCEPHIN) 1 g in dextrose 5 % 50 mL IVPB  Status:  Discontinued     1 g 100 mL/hr  over 30 Minutes Intravenous  Once 03/22/17 2058 03/22/17 2246   03/22/17 2100  metroNIDAZOLE (FLAGYL) IVPB 500 mg  Status:  Discontinued     500 mg 100 mL/hr over 60 Minutes Intravenous  Once 03/22/17 2058 03/22/17 2206   03/22/17 2100  azithromycin (ZITHROMAX) 500 mg in dextrose 5 % 250 mL IVPB  Status:  Discontinued     500 mg 250 mL/hr over 60 Minutes Intravenous  Once 03/22/17 2058 03/22/17 2220        Objective:   Vitals:   03/22/17 2225 03/22/17 2309 03/23/17 0003 03/23/17 0512  BP:  (!) 96/52  (!) 92/47  Pulse:  100  87  Resp:  20  18  Temp: (!) 101.2 F (38.4 C) 98.8 F (37.1 C)  97.6 F (36.4 C)  TempSrc: Rectal Oral  Oral  SpO2:  96% 95% 93%  Weight:  61.7 kg (136 lb)  63 kg (138 lb 12.8 oz)  Height:  5\' 7"  (1.702 m)      Wt Readings from Last 3 Encounters:  03/23/17 63 kg (138 lb 12.8 oz)  02/16/17 63 kg (139 lb)  11/24/16 67.2 kg (148 lb 2 oz)     Intake/Output Summary (Last 24 hours) at 03/23/17 0837 Last data filed at 03/23/17 0831  Gross per 24 hour  Intake          3291.66 ml  Output              700 ml  Net          2591.66 ml     Physical Exam  Awake Alert, Oriented X 3, No new F.N deficits, Normal affect [place].AT,PERRAL Supple Neck,No JVD, No cervical lymphadenopathy appriciated.  Symmetrical Chest wall movement, Good air movement bilaterally,crackles bilateral bases, no wheezing RRR,No Gallops,Rubs or new Murmurs, No Parasternal Heave +ve B.Sounds, Abd Soft, No tenderness, No organomegaly appriciated, No rebound - guarding or rigidity. No Cyanosis, Clubbing or edema, No new Rash or bruise      Data Review:    CBC  Recent Labs Lab 03/22/17 1906 03/22/17 1915  WBC 27.7*  --   HGB 10.6* 11.2*  HCT 34.2* 33.0*  PLT 266  --   MCV 100.9*  --   MCH 31.3  --   MCHC 31.0  --   RDW 17.4*  --   LYMPHSABS 2.2  --   MONOABS 1.1*  --   EOSABS 0.3  --   BASOSABS 0.0  --     Chemistries   Recent Labs Lab 03/22/17 1906 03/22/17 1915    NA 135 137  K 3.9 4.0  CL 102 101  CO2 25  --   GLUCOSE 110* 109*  BUN 18 19  CREATININE 1.01 0.90  CALCIUM 8.7*  --   AST 23  --   ALT 13*  --   ALKPHOS 108  --   BILITOT 0.6  --    ------------------------------------------------------------------------------------------------------------------ No results for input(s): CHOL, HDL, LDLCALC, TRIG, CHOLHDL, LDLDIRECT in the last 72 hours.  No results found for: HGBA1C ------------------------------------------------------------------------------------------------------------------  Recent Labs  03/22/17 2127  TSH 1.313   ------------------------------------------------------------------------------------------------------------------ No results for input(s): VITAMINB12, FOLATE, FERRITIN, TIBC, IRON, RETICCTPCT in the last 72 hours.  Coagulation profile  Recent Labs Lab 03/22/17  1906 03/23/17 0204  INR 2.25 2.58    No results for input(s): DDIMER in the last 72 hours.  Cardiac Enzymes No results for input(s): CKMB, TROPONINI, MYOGLOBIN in the last 168 hours.  Invalid input(s): CK ------------------------------------------------------------------------------------------------------------------    Component Value Date/Time   BNP 198.6 (H) 03/22/2017 2325    Inpatient Medications  Scheduled Meds: . dextromethorphan-guaiFENesin  1 tablet Oral BID  . digoxin  0.125 mg Oral BH-q7a  . ferrous sulfate  325 mg Oral q morning - 00X  . folic acid  1 mg Oral Daily  . ipratropium-albuterol  3 mL Nebulization QID  . montelukast  10 mg Oral QHS  . multivitamin  1 tablet Oral Daily  . Warfarin - Pharmacist Dosing Inpatient   Does not apply q1800   Continuous Infusions: . sodium chloride 100 mL/hr at 03/23/17 0135  . azithromycin Stopped (03/23/17 0029)  . cefTRIAXone (ROCEPHIN)  IV    . metronidazole     PRN Meds:.acetaminophen, albuterol, ondansetron, zolpidem  Micro Results No results found for this or any  previous visit (from the past 240 hour(s)).  Radiology Reports Dg Chest Port 1 View  Result Date: 03/22/2017 CLINICAL DATA:  83 year old presenting with acute mental status changes and nausea and vomiting. EXAM: PORTABLE CHEST 1 VIEW COMPARISON:  10/05/2016, 07/13/2016 and earlier, including CT chest 06/24/2016. FINDINGS: Cardiac silhouette mildly enlarged. Thoracic aorta atherosclerotic, unchanged. Airspace consolidation involving both lung bases. Upper lungs remain clear. Pulmonary vascularity normal. No pleural effusions. IMPRESSION: Pneumonia involving both lung bases. Electronically Signed   By: Evangeline Dakin M.D.   On: 03/22/2017 20:13   Ct Head Code Stroke W/o Cm  Result Date: 03/22/2017 CLINICAL DATA:  Code stroke.  Altered mental status EXAM: CT HEAD WITHOUT CONTRAST TECHNIQUE: Contiguous axial images were obtained from the base of the skull through the vertex without intravenous contrast. COMPARISON:  Head CT 10/05/2016 FINDINGS: Brain: No mass lesion or acute hemorrhage. No focal hypoattenuation of the basal ganglia or cortex to indicate acute infarct old right basal ganglia lacunar infarct. Old right occipital infarct. No hydrocephalus or age advanced atrophy. Vascular: No hyperdense vessel. No advanced atherosclerotic calcification of the arteries at the skull base. Skull: Normal visualized skull base, calvarium and extracranial soft tissues. Sinuses/Orbits: No sinus fluid levels or advanced mucosal thickening. No mastoid effusion. Normal orbits. ASPECTS [facility] Stroke Program Early CT Score) - Ganglionic level infarction (caudate, lentiform nuclei, internal capsule, insula, M1-M3 cortex): 7 - Supraganglionic infarction (M4-M6 cortex): 3 Total score (0-10 with 10 being normal): 10 IMPRESSION: 1. No acute intracranial abnormality. 2. Old right occipital infarct. 3. ASPECTS is 10. Dr. Kerney Elbe was paged regarding these results at 7:27 p.m. on 03/22/2017. Electronically Signed   By: Ulyses Jarred M.D.   On: 03/22/2017 19:27    Time Spent in minutes  30   Jani Gravel M.D on 03/23/2017 at 8:37 AM  Between 7am to 7pm - Pager - [phone]  After 7pm go to [email or web address] - password [facility]  [facility] -  Office  [phone]

## 2017-03-24 ENCOUNTER — Inpatient Hospital Stay (HOSPITAL_COMMUNITY): Payer: Medicare Other

## 2017-03-24 ENCOUNTER — Ambulatory Visit (INDEPENDENT_AMBULATORY_CARE_PROVIDER_SITE_OTHER): Payer: Self-pay | Admitting: Ophthalmology

## 2017-03-24 LAB — COMPREHENSIVE METABOLIC PANEL
ALT: 10 U/L — AB (ref 17–63)
AST: 18 U/L (ref 15–41)
Albumin: 2.8 g/dL — ABNORMAL LOW (ref 3.5–5.0)
Alkaline Phosphatase: 76 U/L (ref 38–126)
Anion gap: 10 (ref 5–15)
BUN: 10 mg/dL (ref 6–20)
CHLORIDE: 106 mmol/L (ref 101–111)
CO2: 19 mmol/L — AB (ref 22–32)
CREATININE: 0.84 mg/dL (ref 0.61–1.24)
Calcium: 7.8 mg/dL — ABNORMAL LOW (ref 8.9–10.3)
GFR calc non Af Amer: >60 mL/min (ref >60)
Glucose, Bld: 89 mg/dL (ref 65–99)
POTASSIUM: 4 mmol/L (ref 3.5–5.1)
SODIUM: 135 mmol/L (ref 135–145)
Total Bilirubin: 0.6 mg/dL (ref 0.3–1.2)
Total Protein: 5.5 g/dL — ABNORMAL LOW (ref 6.5–8.1)

## 2017-03-24 LAB — CBC
HCT: 27.1 % — ABNORMAL LOW (ref 39.0–52.0)
Hemoglobin: 8.5 g/dL — ABNORMAL LOW (ref 13.0–17.0)
MCH: 31.6 pg (ref 26.0–34.0)
MCHC: 31.4 g/dL (ref 30.0–36.0)
MCV: 100.7 fL — ABNORMAL HIGH (ref 78.0–100.0)
PLATELETS: 227 10*3/uL (ref 150–400)
RBC: 2.69 MIL/uL — AB (ref 4.22–5.81)
RDW: 17.5 % — ABNORMAL HIGH (ref 11.5–15.5)
WBC: 23 10*3/uL — AB (ref 4.0–10.5)

## 2017-03-24 LAB — PROTIME-INR
INR: 2.68
PROTHROMBIN TIME: 29.1 s — AB (ref 11.4–15.2)

## 2017-03-24 LAB — T4: T4, Total: 5.4 ug/dL (ref 4.5–12.0)

## 2017-03-24 MED ORDER — IPRATROPIUM-ALBUTEROL 0.5-2.5 (3) MG/3ML IN SOLN: 3.0000 mL | RESPIRATORY_TRACT | Status: DC | PRN

## 2017-03-24 MED ORDER — FUROSEMIDE 10 MG/ML IJ SOLN
20.0000 mg | Freq: Once | INTRAMUSCULAR | Status: AC
  Administered 2017-03-24: 20 mg via INTRAVENOUS
  Filled 2017-03-24: qty 2

## 2017-03-24 MED ORDER — FUROSEMIDE 20 MG PO TABS
20.0000 mg | ORAL_TABLET | ORAL | Status: DC
  Administered 2017-03-24: 20 mg via ORAL
  Filled 2017-03-24: qty 1

## 2017-03-24 MED ORDER — DILTIAZEM HCL 30 MG PO TABS: 30.0000 mg | ORAL_TABLET | Freq: Once | ORAL | Status: DC

## 2017-03-24 NOTE — Progress Notes (Signed)
RT assessment done at this time. BBS clear with mildly diminished bases. Patient stated he does not feel like these nebs do anything for him. I will be changing to PRN. He stated he would call if needed

## 2017-03-24 NOTE — Progress Notes (Signed)
Patient ID: Thomas Townsend, male   DOB: 11-23-1930, 83 y.o.   MRN: [id number]                                                                PROGRESS NOTE                                                                                                                                                                                                             Patient Demographics:    Thomas Townsend, is a 83 y.o. male, DOB - 1931/02/01, KYH:[id number]  Admit date - 03/22/2017   Admitting Physician Ivor Costa, MD  Outpatient Primary MD for the patient is Allyne Gee, MD  LOS - 2  Outpatient Specialists:     Chief Complaint  Patient presents with  . Altered Mental Status       Brief Narrative     83 y.o.malewith medical history significant of hypertension, hyperlipidemia, COPD, GERD, prostate cancer (s/p of radiation seed implant), dCHF, atrial fibrillation on Coumadin, rheumatoid arthritis on methotrexate, myeloproliferative neoplasm with chronic leukocytosis, who presents with cough and altered mental status.  Per patient's daughter, patient became confused at this afternoon. He does not have unilateral weakness, facial droop or slurred speech. He moves all extremities normally. Patient has cough without significant amount of mucus production. He seems to have mild shortness of breath and congestion, but no chest pain. Patient has fever and chills. Patient has nausea and vomited twice. Daughter states that pt may have aspirated when he had vomiting. No abdominal pain or diarrhea. Denies symptoms of UTI.   ED Course:pt was found to have WBC 27.7, INR 2.25, electrolytes renal function okay, negative urinalysis, temperature 103.8, oxygen saturation 92% on room air, soft blood pressure, chest x-ray bilateral infiltration for pneumonia, negative CT-head and negative CT of C-spine for acute abnormalities. Pt is admitted to telemetry bed as inpatient.    Subjective:    Thomas Townsend today  is feeling much better.  Aoxo3.  Pt afebrile.  Denies cp, palp, sob beyond his baseline,  Pox 91-93% on RA.  Pt typically wears home o2 for Copd.  No headache, No abdominal pain - No Nausea, No new weakness tingling or numbness, No Cough    Assessment  & Plan :  Principal Problem:   CAP (community acquired pneumonia) Active Problems:   A-fib (HCC)   Chronic diastolic CHF (congestive heart failure) (HCC)   Arthritis or polyarthritis, rheumatoid (HCC)   Sepsis (HCC)   COPD (chronic obstructive pulmonary disease) (HCC)   Myeloproliferative neoplasm (HCC)   Iron deficiency anemia due to chronic blood loss   Acute metabolic encephalopathy   Aspiration pneumonia (HCC)   CAP vs. Aspiration pneumonia and sepsis: Cxr 6/26=> bilateral infiltrate Blood culture 6/26  => ngtd Respiratory panel 6/26=> negative Speech evaluation 6/27=> minimal aspiration risk D/c Flagyl Cont rocephin , zithromax iv Wbc improving  AMS secondary to CAP, acute metabolic encephalopathy Improved, appreciate neurology inpu  COPD: no wheezing, Stable Cont breathing treatments  Chronic diastolic QQV:9-D echo on 63/87/56 showed EF of 55-60%. CHF is compensated on admission. Lasix was on hold, resume today  Atrial Fibrillation: CHA2DS2-VASc Scoreis 4, needs oral anticoagulation. Patient is on Coumadinat home. INR is 2.25on admission. Heart rate is well controlled. continue digoxin  Coumadin per pharmacy Cardizem 30mg  po x1  Arthritis or polyarthritis, rheumatoid (HCC):stable. On methotrexate at home Hold methotrexate due to ongoing infection  Myeloproliferative neoplasm [facility]): Has a chronic leukocytosis. Patient has been followed up by oncologist, Dr. Marland KitchenB" in [place] [facility] [place]. Patient refused chemotherapy Follow up with oncologist  Iron deficiency anemia:Hemoglobin 10.6 Continue iron supplement Cbc in am   DVT ppx: On coumadin Code Status:DNR  Family Communication:  w  patient Disposition Plan: Anticipate discharge back to previous home environment Consults called:neurology Admission status:Inpatient/tele    Speech 6/27 Regular;Thin liquid   Liquid Administration via: Cup;Straw Medication Administration: Whole meds with liquid (large pills crushed in puree if needed) Supervision: Patient able to self feed;Intermittent supervision to cue for compensatory strategies Compensations: Slow rate;Small sips/bites Postural Changes: Seated upright at 90 degrees;Remain upright for at least 30 minutes after po intake      Lab Results  Component Value Date   PLT 227 03/24/2017    Antibiotics  :   Flagyl 6/26=>6/28 Rocephin/Zithromax 6/26/=>  Anti-infectives    Start     Dose/Rate Route Frequency Ordered Stop   03/23/17 2300  cefTRIAXone (ROCEPHIN) 1 g in dextrose 5 % 50 mL IVPB  Status:  Discontinued     1 g 100 mL/hr over 30 Minutes Intravenous Every 24 hours 03/22/17 2239 03/22/17 2258   03/23/17 2200  cefTRIAXone (ROCEPHIN) 1 g in dextrose 5 % 50 mL IVPB  Status:  Discontinued     1 g 100 mL/hr over 30 Minutes Intravenous Every 24 hours 03/22/17 2206 03/22/17 2239   03/23/17 2200  cefTRIAXone (ROCEPHIN) 1 g in dextrose 5 % 50 mL IVPB     1 g 100 mL/hr over 30 Minutes Intravenous Every 24 hours 03/22/17 2258 03/29/17 2159   03/23/17 0900  metroNIDAZOLE (FLAGYL) IVPB 500 mg  Status:  Discontinued     500 mg 100 mL/hr over 60 Minutes Intravenous Every 8 hours 03/23/17 0056 03/24/17 0553   03/22/17 2300  azithromycin (ZITHROMAX) 500 mg in dextrose 5 % 250 mL IVPB     500 mg 250 mL/hr over 60 Minutes Intravenous Every 24 hours 03/22/17 2206 03/29/17 2159   03/22/17 2300  metroNIDAZOLE (FLAGYL) IVPB 500 mg  Status:  Discontinued     500 mg 100 mL/hr over 60 Minutes Intravenous Every 8 hours 03/22/17 2206 03/23/17 0056   03/22/17 2100  cefTRIAXone (ROCEPHIN) 1 g in dextrose 5 % 50 mL IVPB  Status:  Discontinued  1 g 100 mL/hr over 30  Minutes Intravenous  Once 03/22/17 2058 03/22/17 2246   03/22/17 2100  metroNIDAZOLE (FLAGYL) IVPB 500 mg  Status:  Discontinued     500 mg 100 mL/hr over 60 Minutes Intravenous  Once 03/22/17 2058 03/22/17 2206   03/22/17 2100  azithromycin (ZITHROMAX) 500 mg in dextrose 5 % 250 mL IVPB  Status:  Discontinued     500 mg 250 mL/hr over 60 Minutes Intravenous  Once 03/22/17 2058 03/22/17 2220        Objective:   Vitals:   03/23/17 2050 03/23/17 2357 03/24/17 0049 03/24/17 0406  BP:   (!) 100/56 (!) 93/45  Pulse:  (!) 110 82 (!) 103  Resp:   20 18  Temp:   98.9 F (37.2 C) 99.2 F (37.3 C)  TempSrc:   Oral Oral  SpO2: 98% 92% 94% 92%  Weight:    63 kg (138 lb 14.4 oz)  Height:        Wt Readings from Last 3 Encounters:  03/24/17 63 kg (138 lb 14.4 oz)  02/16/17 63 kg (139 lb)  11/24/16 67.2 kg (148 lb 2 oz)     Intake/Output Summary (Last 24 hours) at 03/24/17 0557 Last data filed at 03/24/17 0050  Gross per 24 hour  Intake          1163.33 ml  Output             1900 ml  Net          -736.67 ml     Physical Exam  Awake Alert, Oriented X 3, No new F.N deficits, Normal affect [place].AT,PERRAL Supple Neck,No JVD, No cervical lymphadenopathy appriciated.  Symmetrical Chest wall movement, Good air movement bilaterally, bibasilar crackles. No wheezing RRR,No Gallops,Rubs or new Murmurs, No Parasternal Heave +ve B.Sounds, Abd Soft, No tenderness, No organomegaly appriciated, No rebound - guarding or rigidity. No Cyanosis, Clubbing or edema, No new Rash or bruise     Data Review:    CBC  Recent Labs Lab 03/22/17 1906 03/22/17 1915 03/24/17 0330  WBC 27.7*  --  23.0*  HGB 10.6* 11.2* 8.5*  HCT 34.2* 33.0* 27.1*  PLT 266  --  227  MCV 100.9*  --  100.7*  MCH 31.3  --  31.6  MCHC 31.0  --  31.4  RDW 17.4*  --  17.5*  LYMPHSABS 2.2  --   --   MONOABS 1.1*  --   --   EOSABS 0.3  --   --   BASOSABS 0.0  --   --     Chemistries   Recent Labs Lab  03/22/17 1906 03/22/17 1915 03/24/17 0330  NA 135 137 135  K 3.9 4.0 4.0  CL 102 101 106  CO2 25  --  19*  GLUCOSE 110* 109* 89  BUN 18 19 10   CREATININE 1.01 0.90 0.84  CALCIUM 8.7*  --  7.8*  AST 23  --  18  ALT 13*  --  10*  ALKPHOS 108  --  76  BILITOT 0.6  --  0.6   ------------------------------------------------------------------------------------------------------------------ No results for input(s): CHOL, HDL, LDLCALC, TRIG, CHOLHDL, LDLDIRECT in the last 72 hours.  No results found for: HGBA1C ------------------------------------------------------------------------------------------------------------------  Recent Labs  03/22/17 2127  TSH 1.313  T4TOTAL 5.4   ------------------------------------------------------------------------------------------------------------------ No results for input(s): VITAMINB12, FOLATE, FERRITIN, TIBC, IRON, RETICCTPCT in the last 72 hours.  Coagulation profile  Recent Labs Lab 03/22/17 1906 03/23/17 0204 03/24/17  0330  INR 2.25 2.58 2.68    No results for input(s): DDIMER in the last 72 hours.  Cardiac Enzymes No results for input(s): CKMB, TROPONINI, MYOGLOBIN in the last 168 hours.  Invalid input(s): CK ------------------------------------------------------------------------------------------------------------------    Component Value Date/Time   BNP 198.6 (H) 03/22/2017 2325    Inpatient Medications  Scheduled Meds: . dextromethorphan-guaiFENesin  1 tablet Oral BID  . digoxin  0.125 mg Oral BH-q7a  . ferrous sulfate  325 mg Oral q morning - 84O  . folic acid  1 mg Oral Daily  . ipratropium-albuterol  3 mL Nebulization QID  . montelukast  10 mg Oral QHS  . multivitamin  1 tablet Oral Daily  . warfarin  4 mg Oral q1800  . Warfarin - Pharmacist Dosing Inpatient   Does not apply q1800   Continuous Infusions: . sodium chloride 100 mL/hr at 03/23/17 0135  . azithromycin Stopped (03/24/17 0054)  . cefTRIAXone  (ROCEPHIN)  IV Stopped (03/23/17 2309)   PRN Meds:.acetaminophen, albuterol, alum & mag hydroxide-simeth, ondansetron, zolpidem  Micro Results Recent Results (from the past 240 hour(s))  Blood culture (routine x 2)     Status: None (Preliminary result)   Collection Time: 03/22/17  9:27 PM  Result Value Ref Range Status   Specimen Description BLOOD RIGHT WRIST  Final   Special Requests   Final    BOTTLES DRAWN AEROBIC AND ANAEROBIC Blood Culture adequate volume   Culture NO GROWTH < 24 HOURS  Final   Report Status PENDING  Incomplete  Blood culture (routine x 2)     Status: None (Preliminary result)   Collection Time: 03/22/17  9:42 PM  Result Value Ref Range Status   Specimen Description BLOOD RIGHT ARM  Final   Special Requests   Final    BOTTLES DRAWN AEROBIC AND ANAEROBIC Blood Culture adequate volume   Culture NO GROWTH < 24 HOURS  Final   Report Status PENDING  Incomplete  Respiratory Panel by PCR     Status: None   Collection Time: 03/23/17  9:10 AM  Result Value Ref Range Status   Adenovirus NOT DETECTED NOT DETECTED Final   Coronavirus 229E NOT DETECTED NOT DETECTED Final   Coronavirus HKU1 NOT DETECTED NOT DETECTED Final   Coronavirus NL63 NOT DETECTED NOT DETECTED Final   Coronavirus OC43 NOT DETECTED NOT DETECTED Final   Metapneumovirus NOT DETECTED NOT DETECTED Final   Rhinovirus / Enterovirus NOT DETECTED NOT DETECTED Final   Influenza A NOT DETECTED NOT DETECTED Final   Influenza B NOT DETECTED NOT DETECTED Final   Parainfluenza Virus 1 NOT DETECTED NOT DETECTED Final   Parainfluenza Virus 2 NOT DETECTED NOT DETECTED Final   Parainfluenza Virus 3 NOT DETECTED NOT DETECTED Final   Parainfluenza Virus 4 NOT DETECTED NOT DETECTED Final   Respiratory Syncytial Virus NOT DETECTED NOT DETECTED Final   Bordetella pertussis NOT DETECTED NOT DETECTED Final   Chlamydophila pneumoniae NOT DETECTED NOT DETECTED Final   Mycoplasma pneumoniae NOT DETECTED NOT DETECTED Final     Radiology Reports Dg Chest Port 1 View  Result Date: 03/22/2017 CLINICAL DATA:  83 year old presenting with acute mental status changes and nausea and vomiting. EXAM: PORTABLE CHEST 1 VIEW COMPARISON:  10/05/2016, 07/13/2016 and earlier, including CT chest 06/24/2016. FINDINGS: Cardiac silhouette mildly enlarged. Thoracic aorta atherosclerotic, unchanged. Airspace consolidation involving both lung bases. Upper lungs remain clear. Pulmonary vascularity normal. No pleural effusions. IMPRESSION: Pneumonia involving both lung bases. Electronically Signed   By: Marcello Moores  Lawrence M.D.   On: 03/22/2017 20:13   Ct Head Code Stroke W/o Cm  Result Date: 03/22/2017 CLINICAL DATA:  Code stroke.  Altered mental status EXAM: CT HEAD WITHOUT CONTRAST TECHNIQUE: Contiguous axial images were obtained from the base of the skull through the vertex without intravenous contrast. COMPARISON:  Head CT 10/05/2016 FINDINGS: Brain: No mass lesion or acute hemorrhage. No focal hypoattenuation of the basal ganglia or cortex to indicate acute infarct old right basal ganglia lacunar infarct. Old right occipital infarct. No hydrocephalus or age advanced atrophy. Vascular: No hyperdense vessel. No advanced atherosclerotic calcification of the arteries at the skull base. Skull: Normal visualized skull base, calvarium and extracranial soft tissues. Sinuses/Orbits: No sinus fluid levels or advanced mucosal thickening. No mastoid effusion. Normal orbits. ASPECTS [facility] Stroke Program Early CT Score) - Ganglionic level infarction (caudate, lentiform nuclei, internal capsule, insula, M1-M3 cortex): 7 - Supraganglionic infarction (M4-M6 cortex): 3 Total score (0-10 with 10 being normal): 10 IMPRESSION: 1. No acute intracranial abnormality. 2. Old right occipital infarct. 3. ASPECTS is 10. Dr. Kerney Elbe was paged regarding these results at 7:27 p.m. on 03/22/2017. Electronically Signed   By: Ulyses Jarred M.D.   On: 03/22/2017 19:27     Time Spent in minutes  30   Jani Gravel M.D on 03/24/2017 at 5:57 AM  Between 7am to 7pm - Pager - [phone]  After 7pm go to [email or web address] - password [facility]  [facility] -  Office  [phone]

## 2017-03-24 NOTE — Progress Notes (Signed)
ANTICOAGULATION CONSULT NOTE - Initial Consult  Pharmacy Consult for Coumadin Indication: atrial fibrillation  No Known Allergies  Patient Measurements: Height: 5\' 7"  (170.2 cm) Weight: 138 lb 14.4 oz (63 kg) IBW/kg (Calculated) : 66.1  Vital Signs: Temp: 98.7 F (37.1 C) (06/28 0831) Temp Source: Oral (06/28 0831) BP: 108/62 (06/28 0831) Pulse Rate: 56 (06/28 0831)  Labs:  Recent Labs  03/22/17 1906 03/22/17 1915 03/22/17 2127 03/23/17 0204 03/24/17 0330  HGB 10.6* 11.2*  --   --  8.5*  HCT 34.2* 33.0*  --   --  27.1*  PLT 266  --   --   --  227  APTT 48*  --   --   --   --   LABPROT 25.3*  --   --  28.2* 29.1*  INR 2.25  --   --  2.58 2.68  CREATININE 1.01 0.90  --   --  0.84  CKTOTAL  --   --  22*  --   --     Estimated Creatinine Clearance: 59.4 mL/min (by C-G formula based on SCr of 0.84 mg/dL).   Assessment: 83 y.o. male admitted with SOB, poss PNA, on warfarin PTA for atrial fibrillation (CHA2DS2-VASc Score is 4). INR 2.68. Flagyl d/c'd pt only received total of 4 doses. hgb 8.5 < 10.6, pltc 227. No bleeding noted per chart.   PTA warfarin dose 4 mg per med rec  Goal of Therapy:  INR 2-3 Monitor platelets by anticoagulation protocol: Yes   Plan:  Coumadin 4mg  po daily Daily PT/INR Monitor CBC, s/sx of bleeding closely  Maryanna Shape, PharmD, BCPS  Clinical Pharmacist  Pager: [phone]  03/24/2017,8:46 AM

## 2017-03-25 LAB — LEGIONELLA PNEUMOPHILA SEROGP 1 UR AG: L. pneumophila Serogp 1 Ur Ag: NEGATIVE

## 2017-03-25 LAB — PROTIME-INR
INR: 2.43
Prothrombin Time: 26.8 seconds — ABNORMAL HIGH (ref 11.4–15.2)

## 2017-03-25 MED ORDER — IPRATROPIUM-ALBUTEROL 0.5-2.5 (3) MG/3ML IN SOLN: 3.0000 mL | RESPIRATORY_TRACT | 0 refills | Status: DC | PRN

## 2017-03-25 MED ORDER — DM-GUAIFENESIN ER 30-600 MG PO TB12: 1.0000 | ORAL_TABLET | Freq: Two times a day (BID) | ORAL | 0 refills | Status: DC

## 2017-03-25 MED ORDER — ORAL CARE MOUTH RINSE: 15.0000 mL | Freq: Two times a day (BID) | OROMUCOSAL | Status: DC

## 2017-03-25 MED ORDER — AZITHROMYCIN 250 MG PO TABS: 500.0000 mg | ORAL_TABLET | Freq: Every day | ORAL | 0 refills | Status: AC

## 2017-03-25 MED ORDER — CEFUROXIME AXETIL 500 MG PO TABS: 500.0000 mg | ORAL_TABLET | Freq: Two times a day (BID) | ORAL | 0 refills | Status: AC

## 2017-03-25 NOTE — Progress Notes (Signed)
Pt has orders to be discharged. Discharge instructions given and pt has no additional questions at this time. Medication regimen reviewed and pt educated. Pt verbalized understanding and has no additional questions. Telemetry box removed. IV removed and site in good condition. Pt stable and waiting for transportation.  Cannen Dupras RN 

## 2017-03-25 NOTE — Progress Notes (Signed)
ANTICOAGULATION CONSULT NOTE - Initial Consult  Pharmacy Consult for Coumadin Indication: atrial fibrillation  No Known Allergies  Patient Measurements: Height: 5\' 7"  (170.2 cm) Weight: 133 lb 6.4 oz (60.5 kg) IBW/kg (Calculated) : 66.1  Vital Signs: Temp: 98.1 F (36.7 C) (06/29 0452) Temp Source: Oral (06/29 0452) BP: 91/60 (06/29 0452) Pulse Rate: 90 (06/29 0452)  Labs:  Recent Labs  03/22/17 1906 03/22/17 1915 03/22/17 2127 03/23/17 0204 03/24/17 0330 03/25/17 0323  HGB 10.6* 11.2*  --   --  8.5*  --   HCT 34.2* 33.0*  --   --  27.1*  --   PLT 266  --   --   --  227  --   APTT 48*  --   --   --   --   --   LABPROT 25.3*  --   --  28.2* 29.1* 26.8*  INR 2.25  --   --  2.58 2.68 2.43  CREATININE 1.01 0.90  --   --  0.84  --   CKTOTAL  --   --  22*  --   --   --     Estimated Creatinine Clearance: 57 mL/min (by C-G formula based on SCr of 0.84 mg/dL).   Assessment: 83 y.o. male admitted with SOB, poss PNA, on warfarin PTA for atrial fibrillation (CHA2DS2-VASc Score is 4). INR 2.43 therapeutic on home dose. Flagyl d/c'd pt only received total of 4 doses. No bleeding noted per chart. Plan for discharge today   PTA warfarin dose 4 mg per med rec  Goal of Therapy:  INR 2-3 Monitor platelets by anticoagulation protocol: Yes   Plan:  Continue Coumadin 4mg  po daily after discharge  Maryanna Shape, PharmD, BCPS  Clinical Pharmacist  Pager: [phone]  03/25/2017,10:25 AM

## 2017-03-25 NOTE — Care Management Important Message (Signed)
Important Message  Patient Details  Name: Thomas Townsend MRN: [id number] Date of Birth: May 11, 1931   Medicare Important Message Given:  Yes    [name] [address] 03/25/2017, 12:39 PM

## 2017-03-25 NOTE — Discharge Summary (Signed)
Thomas Townsend, is a 83 y.o. male  DOB [birth year]  MRN [id number].  Admission date:  03/22/2017  Admitting Physician  Ivor Costa, MD  Discharge Date:  03/25/2017   Primary MD  Allyne Gee, MD  Recommendations for primary care physician for things to follow:    CAP vs. Aspiration pneumonia and sepsis: Patient has fever, leukocytosis, cough and chest x-ray findings of bilateral infiltration, consistent with CAP. Patient has possible aspiration pneumonia.  Speech therapy evaluation 6/27=> negative for aspiration Zithromax 250mg  po qday x 3 days Cefuroxime 500mg  po bid x 4 days  COPD: Stable Continue home breathing treatments  Chronic diastolic QZE:0-P echo on 23/30/07 showed EF of 55-60%. Patient does not have leg edema or JVD. CHF is compensated on admission. -Hold Lasix due to sepsis -Check BNP  Atrial Fibrillation: CHA2DS2-VASc Scoreis 4, needs oral anticoagulation. Patient is on Coumadinat home. INR is 2.25on admission. Heart rate is well controlled. Coumadin per home dose Repeat INR in 5-7 days  Arthritis or polyarthritis, rheumatoid (HCC):stable. On methotrexate at home -Held methotrexate due to ongoing infection Please f/u with rheumatology, pcp to arrange May resume methotrexate in 2 weeks  Myeloproliferative neoplasm [facility]): Has a chronic leukocytosis. Patient has been followed up by oncologist, Dr. Marland KitchenB" in [place] [facility] [place]. Patient refused chemotherapy -Follow up with oncologist  Iron deficiency anemia:Hemoglobin 10.6 -Continue iron supplement Repeat cbc in 5-7 days  Acute metabolic encephalopathy: Due to pneumonia and sepsis Resolved with treatment of pneumonia     Admission Diagnosis  Disorientation [R41.0] Pneumonia of both lower lobes due to infectious organism [facility]) [J18.1]   Discharge Diagnosis  Disorientation [R41.0] Pneumonia of both  lower lobes due to infectious organism ([place]) [J18.1]    Principal Problem:   CAP (community acquired pneumonia) Active Problems:   A-fib ([place])   Chronic diastolic CHF (congestive heart failure) (HCC)   Arthritis or polyarthritis, rheumatoid (HCC)   Sepsis ([place])   COPD (chronic obstructive pulmonary disease) ([place])   Myeloproliferative neoplasm ([place])   Iron deficiency anemia due to chronic blood loss   Acute metabolic encephalopathy   Aspiration pneumonia ([place])      Past Medical History:  Diagnosis Date  . Arthritis   . Atrial fibrillation ([place])   . CHF (congestive heart failure) ([place])   . COPD (chronic obstructive pulmonary disease) ([place])   . Dysrhythmia   . GERD (gastroesophageal reflux disease)   . Hyperlipemia   . Hypertension   . Leukocytosis 01/14/2016  . Prostate cancer [facility])     Past Surgical History:  Procedure Laterality Date  . CHOLECYSTECTOMY    . COLONOSCOPY WITH PROPOFOL N/A 04/04/2015   Procedure: COLONOSCOPY WITH PROPOFOL;  Surgeon: Manya Silvas, MD;  Location: [facility] ENDOSCOPY;  Service: Endoscopy;  Laterality: N/A;  . ESOPHAGOGASTRODUODENOSCOPY N/A 04/04/2015   Procedure: ESOPHAGOGASTRODUODENOSCOPY (EGD);  Surgeon: Manya Silvas, MD;  Location: [facility] ENDOSCOPY;  Service: Endoscopy;  Laterality: N/A;       HPI  from the history and physical  done on the day of admission:    83 y.o. male with medical history significant of hypertension, hyperlipidemia, COPD, GERD, prostate cancer (s/p of radiation seed implant), dCHF, atrial fibrillation on Coumadin, rheumatoid arthritis on methotrexate, myeloproliferative neoplasm with chronic leukocytosis, who presents with cough and altered mental status.  Per patient's daughter, patient became confused at this afternoon. He does not have unilateral weakness, facial droop or slurred speech. He moves all extremities normally. Patient has cough without significant amount of mucus production. He seems to have mild shortness  of breath and congestion, but no chest pain. Patient has fever and chills. Patient has nausea and vomited twice. Daughter states that pt may have aspirated when he had vomiting. No abdominal pain or diarrhea. Denies symptoms of UTI.   ED Course: pt was found to have WBC 27.7, INR 2.25, electrolytes renal function okay, negative urinalysis, temperature 103.8, oxygen saturation 92% on room air, soft blood pressure, chest x-ray bilateral infiltration for pneumonia, negative CT-head and negative CT of C-spine for acute abnormalities. Pt is admitted to telemetry bed as inpatient.     Hospital Course:      Pt was admitted and methotrexate was held.  Pt was started on rocephin and zithromax. Iv.  His wbc count improved from 27.7 to 23.  Pt states that his breathing is back to baseline.  His altered mental status resolved with treatment of pneumonia.  He has been afebrile.  Respiratory panel 6/27 negative,  Blood culture 6/26 ngtd. There was some concern for aspiration initially and pt received a couple doses of oral flagyl .  Speech therapy evaluated and there was no sign of aspiration. Flagyl was discontinued.   Pt feels back to baseline and requesting discharge to home.  He uses home o2 at baseline.   Follow UP  Follow-up Information    Allyne Gee, MD Follow up in 1 week(s).   Specialty:  Internal Medicine Contact information: [place] [address] [phone]            Consults obtained - speech therapy  Discharge Condition: stable  Diet and Activity recommendation: See Discharge Instructions below  Discharge Instructions         Discharge Medications     Allergies as of 03/25/2017   No Known Allergies     Medication List    TAKE these medications   albuterol 108 (90 Base) MCG/ACT inhaler Commonly known as:  PROVENTIL HFA;VENTOLIN HFA Inhale 2 puffs into the lungs every 6 (six) hours as needed for wheezing or shortness of breath.   azithromycin  250 MG tablet Commonly known as:  ZITHROMAX Take 2 tablets (500 mg total) by mouth daily. Take 1 tablet daily for 3 days.   BREO ELLIPTA 100-25 MCG/INH Aepb Generic drug:  fluticasone furoate-vilanterol Inhale 1 puff into the lungs every morning.   cefUROXime 500 MG tablet Commonly known as:  CEFTIN Take 1 tablet (500 mg total) by mouth 2 (two) times daily.   dextromethorphan-guaiFENesin 30-600 MG 12hr tablet Commonly known as:  MUCINEX DM Take 1 tablet by mouth 2 (two) times daily.   digoxin 0.125 MG tablet Commonly known as:  LANOXIN Take 0.125 mg by mouth every morning.   ferrous sulfate 325 (65 FE) MG tablet Take 325 mg by mouth every morning. Reported on 0/06/9322   folic acid 1 MG tablet Commonly known as:  FOLVITE Take 1 mg by mouth daily.   furosemide 20 MG tablet Commonly known as:  LASIX Take  20 mg by mouth every other day. In the morning   ipratropium-albuterol 0.5-2.5 (3) MG/3ML Soln Commonly known as:  DUONEB Take 3 mLs by nebulization every 4 (four) hours as needed.   methotrexate 2.5 MG tablet Commonly known as:  RHEUMATREX TAKE 6 TABLETS (15 MG TOTAL) BY MOUTH EVERY 7 (SEVEN) DAYS.   montelukast 10 MG tablet Commonly known as:  SINGULAIR Take 10 mg by mouth daily.   PRESERVISION AREDS PO Take 1 tablet by mouth every morning.   warfarin 4 MG tablet Commonly known as:  COUMADIN Take 4 mg by mouth daily.       Major procedures and Radiology Reports - PLEASE review detailed and final reports for all details, in brief -      Dg Chest 2 View  Result Date: 03/24/2017 CLINICAL DATA:  Follow-up pneumonia.  Cough.  COPD EXAM: CHEST  2 VIEW COMPARISON:  03/22/2017 FINDINGS: Bilateral lower lobe airspace opacities are again noted, slightly worsened since prior study. Small bilateral pleural effusions. Heart is upper limits normal in size. Diffuse interstitial prominence throughout the lungs. No acute bony abnormality. IMPRESSION: Worsening bibasilar  airspace opacities concerning for pneumonia. Small bilateral effusions. Electronically Signed   By: Rolm Baptise M.D.   On: 03/24/2017 09:39   Dg Chest Port 1 View  Result Date: 03/22/2017 CLINICAL DATA:  83 year old presenting with acute mental status changes and nausea and vomiting. EXAM: PORTABLE CHEST 1 VIEW COMPARISON:  10/05/2016, 07/13/2016 and earlier, including CT chest 06/24/2016. FINDINGS: Cardiac silhouette mildly enlarged. Thoracic aorta atherosclerotic, unchanged. Airspace consolidation involving both lung bases. Upper lungs remain clear. Pulmonary vascularity normal. No pleural effusions. IMPRESSION: Pneumonia involving both lung bases. Electronically Signed   By: Evangeline Dakin M.D.   On: 03/22/2017 20:13   Ct Head Code Stroke W/o Cm  Result Date: 03/22/2017 CLINICAL DATA:  Code stroke.  Altered mental status EXAM: CT HEAD WITHOUT CONTRAST TECHNIQUE: Contiguous axial images were obtained from the base of the skull through the vertex without intravenous contrast. COMPARISON:  Head CT 10/05/2016 FINDINGS: Brain: No mass lesion or acute hemorrhage. No focal hypoattenuation of the basal ganglia or cortex to indicate acute infarct old right basal ganglia lacunar infarct. Old right occipital infarct. No hydrocephalus or age advanced atrophy. Vascular: No hyperdense vessel. No advanced atherosclerotic calcification of the arteries at the skull base. Skull: Normal visualized skull base, calvarium and extracranial soft tissues. Sinuses/Orbits: No sinus fluid levels or advanced mucosal thickening. No mastoid effusion. Normal orbits. ASPECTS [facility] Stroke Program Early CT Score) - Ganglionic level infarction (caudate, lentiform nuclei, internal capsule, insula, M1-M3 cortex): 7 - Supraganglionic infarction (M4-M6 cortex): 3 Total score (0-10 with 10 being normal): 10 IMPRESSION: 1. No acute intracranial abnormality. 2. Old right occipital infarct. 3. ASPECTS is 10. Dr. Kerney Elbe was paged  regarding these results at 7:27 p.m. on 03/22/2017. Electronically Signed   By: Ulyses Jarred M.D.   On: 03/22/2017 19:27    Micro Results     Recent Results (from the past 240 hour(s))  Blood culture (routine x 2)     Status: None (Preliminary result)   Collection Time: 03/22/17  9:27 PM  Result Value Ref Range Status   Specimen Description BLOOD RIGHT WRIST  Final   Special Requests   Final    BOTTLES DRAWN AEROBIC AND ANAEROBIC Blood Culture adequate volume   Culture NO GROWTH 2 DAYS  Final   Report Status PENDING  Incomplete  Blood culture (routine x 2)  Status: None (Preliminary result)   Collection Time: 03/22/17  9:42 PM  Result Value Ref Range Status   Specimen Description BLOOD RIGHT ARM  Final   Special Requests   Final    BOTTLES DRAWN AEROBIC AND ANAEROBIC Blood Culture adequate volume   Culture NO GROWTH 2 DAYS  Final   Report Status PENDING  Incomplete  Respiratory Panel by PCR     Status: None   Collection Time: 03/23/17  9:10 AM  Result Value Ref Range Status   Adenovirus NOT DETECTED NOT DETECTED Final   Coronavirus 229E NOT DETECTED NOT DETECTED Final   Coronavirus HKU1 NOT DETECTED NOT DETECTED Final   Coronavirus NL63 NOT DETECTED NOT DETECTED Final   Coronavirus OC43 NOT DETECTED NOT DETECTED Final   Metapneumovirus NOT DETECTED NOT DETECTED Final   Rhinovirus / Enterovirus NOT DETECTED NOT DETECTED Final   Influenza A NOT DETECTED NOT DETECTED Final   Influenza B NOT DETECTED NOT DETECTED Final   Parainfluenza Virus 1 NOT DETECTED NOT DETECTED Final   Parainfluenza Virus 2 NOT DETECTED NOT DETECTED Final   Parainfluenza Virus 3 NOT DETECTED NOT DETECTED Final   Parainfluenza Virus 4 NOT DETECTED NOT DETECTED Final   Respiratory Syncytial Virus NOT DETECTED NOT DETECTED Final   Bordetella pertussis NOT DETECTED NOT DETECTED Final   Chlamydophila pneumoniae NOT DETECTED NOT DETECTED Final   Mycoplasma pneumoniae NOT DETECTED NOT DETECTED Final        Today   Subjective    Thomas Townsend today has no dyspnea beyond his baseline.  no headache,no chest abdominal pain,no new weakness tingling or numbness, feels much better wants to go home today.    Objective   Blood pressure 91/60, pulse 90, temperature 98.1 F (36.7 C), temperature source Oral, resp. rate 18, height 5\' 7"  (1.702 m), weight 60.5 kg (133 lb 6.4 oz), SpO2 95 %.   Intake/Output Summary (Last 24 hours) at 03/25/17 0654 Last data filed at 03/25/17 0547  Gross per 24 hour  Intake              660 ml  Output             1745 ml  Net            -1085 ml    Exam Awake Alert, Oriented x 3, No new F.N deficits, Normal affect .AT,PERRAL Supple Neck,No JVD, No cervical lymphadenopathy appriciated.  Symmetrical Chest wall movement, Good air movement bilaterally, CTAB RRR,No Gallops,Rubs or new Murmurs, No Parasternal Heave +ve B.Sounds, Abd Soft, Non tender, No organomegaly appriciated, No rebound -guarding or rigidity. No Cyanosis, Clubbing or edema, No new Rash or bruise   Data Review   CBC w Diff: Lab Results  Component Value Date   WBC 23.0 (H) 03/24/2017   HGB 8.5 (L) 03/24/2017   HGB 7.9 (L) 01/23/2015   HCT 27.1 (L) 03/24/2017   HCT 24.7 (L) 01/23/2015   PLT 227 03/24/2017   PLT 214 01/23/2015   LYMPHOPCT 8 03/22/2017   LYMPHOPCT 2.9 01/23/2015   BANDSPCT 5 08/26/2016   MONOPCT 4 03/22/2017   MONOPCT 22.8 01/23/2015   EOSPCT 1 03/22/2017   EOSPCT 0.0 01/23/2015   BASOPCT 0 03/22/2017   BASOPCT 0.3 01/23/2015    CMP: Lab Results  Component Value Date   NA 135 03/24/2017   NA 140 01/22/2015   K 4.0 03/24/2017   K 3.7 01/22/2015   CL 106 03/24/2017   CL 106 01/22/2015   CO2  19 (L) 03/24/2017   CO2 25 01/22/2015   BUN 10 03/24/2017   BUN 16 01/22/2015   CREATININE 0.84 03/24/2017   CREATININE 0.88 01/22/2015   PROT 5.5 (L) 03/24/2017   PROT 6.8 01/21/2015   ALBUMIN 2.8 (L) 03/24/2017   ALBUMIN 3.3 (L) 01/21/2015   BILITOT 0.6  03/24/2017   BILITOT 0.9 01/21/2015   ALKPHOS 76 03/24/2017   ALKPHOS 86 01/21/2015   AST 18 03/24/2017   AST 20 01/21/2015   ALT 10 (L) 03/24/2017   ALT 6 (L) 01/21/2015  .   Total Time in preparing paper work, data evaluation and todays exam - 9 minutes  Jani Gravel M.D on 03/25/2017 at [place]  [phone]

## 2017-03-26 DIAGNOSIS — J189 Pneumonia, unspecified organism: Secondary | ICD-10-CM | POA: Diagnosis not present

## 2017-03-27 LAB — CULTURE, BLOOD (ROUTINE X 2)
CULTURE: NO GROWTH
Culture: NO GROWTH
Special Requests: ADEQUATE
Special Requests: ADEQUATE

## 2017-03-28 ENCOUNTER — Encounter (HOSPITAL_COMMUNITY): Payer: Self-pay

## 2017-03-31 ENCOUNTER — Other Ambulatory Visit: Payer: Self-pay | Admitting: *Deleted

## 2017-03-31 NOTE — Patient Outreach (Signed)
[name] [facility]) Care Management  03/31/2017  Thomas Townsend [birth year] [id number]   EMMI-Pneumonia RED ON EMMI ALERT DAY#: 3 DATE: 03/28/17 RED ALERT: Feeling better overall? No  Care Coordination: Phone call placed to Dr. Lindon Romp office, spoke with [facility]. Beth confirmed patient's appointment scheduled 04/07/17 at 1045. Beth stated, the MD's office will contact patient within two days prior to appointment to confirm.   [name], RN, BSN, MHA/MSL, [place] Telephonic Care Manager Coordinator [facility] Direct Phone: [phone] Toll Free: [phone] Fax: [phone]

## 2017-03-31 NOTE — Patient Outreach (Signed)
[place] [facility]) Care Management  03/31/2017  Thomas Townsend 11/12/1930 [id number]  EMMI-Pneumonia RED ON EMMI ALERT DAY#: 3 DATE: 03/28/17 RED ALERT: Feeling better overall? No   Spoke with patient. Reviewed and addressed red alert. HIPAA verified with patient. Patient reported, he is "doing better". He stated, "he didn't have much time to talk".  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 He was concerned about his follow-up appointment. Informed patient that RN CM [place]) will contact his PCP to inquire about his appointment and patient agreed.   Plan: RN CM will contact PCP to inquire about follow-up appointment. RN CM will notify [facility] CM administrative assistant regarding case closure.    [name], RN, BSN, MHA/MSL, [place] Telephonic Care Manager Coordinator [facility] Direct Phone: [phone] Toll Free: [phone] Fax: [phone]

## 2017-04-01 ENCOUNTER — Other Ambulatory Visit: Payer: Self-pay | Admitting: *Deleted

## 2017-04-01 NOTE — Patient Outreach (Signed)
[place] [facility]) Care Management  04/01/2017  DAYLAN JUHNKE 1930/12/18 [id number]  EMMI-Pneumonia RED ON EMMI ALERT DAY#: 4 DATE: 03/29/17 RED ALERT: Scheduled a follow-up appointment? No More short of breath than yesterday? Yes  Outreach attempt # 1 spoke with patient regarding EMMI automated phone call. HIPAA verified with patient. Patient reported, he is doing well. He stated, he does have minimum amount of shortness of breath. He verbalized using his prescribed inhaler daily. The inhaler helps to control his breathing, per patient. Patient continues to work on his farm. Appointment with PCP is scheduled for 04/07/17 at 1045. Patient stated, he wrote the date on his calendar. Informed patient to notify the MD for any concerns or questions.  Plan:  RN CM will notify [facility] CM administrative assistant regarding case closure.   [name], RN, BSN, MHA/MSL, [place] Telephonic Care Manager Coordinator [facility] Direct Phone: [phone] Toll Free: [phone] Fax: [phone]

## 2017-04-05 ENCOUNTER — Other Ambulatory Visit: Payer: Self-pay | Admitting: *Deleted

## 2017-04-05 NOTE — Patient Outreach (Signed)
[place] [facility]) Care Management  04/05/2017  Thomas Townsend 1931-08-15 [id number]  EMMI-Pneumonia RED ON EMMI ALERT DAY#: 7 DATE: 04/01/17 RED ALERT: Wheezing more than yesterday? Yes  Outreach attempt # 1, spoke with patient. Reviewed and addressed red alert. Patient reported, he is doing well. He stated, he hasn't noticed any wheezing. The EMMI automated system did not record the answer accurately. Patient verbalized, his breathing is much better. He stated," it may not be the best, but it is better". Patient concerned about arranging an appointment with a Urologist. He reported leaving a message with the MD's office staff to arrange an appointment. He is waiting for a return phone call. Reinforced the importance of notifying the MD for any significant medical changes.   Plan: RN CM will notify [facility] CM administrative assistant regarding case closure.   [name], RN, BSN, MHA/MSL, [place] Telephonic Care Manager Coordinator [facility] Direct Phone: [phone] Toll Free: [phone] Fax: [phone]

## 2017-04-07 DIAGNOSIS — J449 Chronic obstructive pulmonary disease, unspecified: Secondary | ICD-10-CM | POA: Diagnosis not present

## 2017-04-07 DIAGNOSIS — A419 Sepsis, unspecified organism: Secondary | ICD-10-CM | POA: Diagnosis not present

## 2017-04-07 DIAGNOSIS — J189 Pneumonia, unspecified organism: Secondary | ICD-10-CM | POA: Diagnosis not present

## 2017-04-11 ENCOUNTER — Ambulatory Visit (INDEPENDENT_AMBULATORY_CARE_PROVIDER_SITE_OTHER): Payer: Medicare Other | Admitting: Podiatry

## 2017-04-11 ENCOUNTER — Encounter: Payer: Self-pay | Admitting: Podiatry

## 2017-04-11 VITALS — BP 109/68 | HR 63

## 2017-04-11 DIAGNOSIS — B351 Tinea unguium: Secondary | ICD-10-CM | POA: Diagnosis not present

## 2017-04-11 DIAGNOSIS — M79676 Pain in unspecified toe(s): Secondary | ICD-10-CM

## 2017-04-11 NOTE — Progress Notes (Signed)
   Subjective:    Patient ID: Thomas Townsend, male    DOB: 1931-09-16, 83 y.o.   MRN: [id number]  HPI this patient is brought to the office by his daughter who states that she just discovered how long his nails are growing off previous hospital stay.  He says his nails are painful walking and wearing his shoes.  Patient is unable to self treat.  Patient has a history of cardiac heart failure as well as leukemia.  He presents the office today for an evaluation and treatment of his long thick nails    Review of Systems  Constitutional: Positive for fatigue.  HENT: Positive for hearing loss.        Objective:   Physical Exam GENERAL APPEARANCE: Alert, conversant. Appropriately groomed. No acute distress.  VASCULAR: Pedal pulses are  palpable at  DP  bilateral. PT pulses are absent  B/L. Capillary refill time is immediate to all digits,  Normal temperature gradient.   NEUROLOGIC: sensation is normal to 5.07 monofilament at 5/5 sites bilateral.  Light touch is intact bilateral, Muscle strength normal.  MUSCULOSKELETAL: acceptable muscle strength, tone and stability bilateral.  HAV  B/L. NAILS  thick disfigured discolored nails with subungual debris noted.  No evidence of any bacterial infection or drainage DERMATOLOGIC: skin color, texture, and turgor are within normal limits.  No preulcerative lesions or ulcers  are seen, no interdigital maceration noted.  No open lesions present.  Digital nails are asymptomatic. No drainage noted.         Assessment & Plan:  Onychomycosis  B/L  IE  Debride nails.  Return to clinic in 3 months for preventative foot care services   Gardiner Barefoot DPM

## 2017-04-12 DIAGNOSIS — M0579 Rheumatoid arthritis with rheumatoid factor of multiple sites without organ or systems involvement: Secondary | ICD-10-CM | POA: Diagnosis not present

## 2017-04-12 DIAGNOSIS — C9110 Chronic lymphocytic leukemia of B-cell type not having achieved remission: Secondary | ICD-10-CM | POA: Insufficient documentation

## 2017-04-12 DIAGNOSIS — I480 Paroxysmal atrial fibrillation: Secondary | ICD-10-CM | POA: Diagnosis not present

## 2017-04-12 DIAGNOSIS — I482 Chronic atrial fibrillation: Secondary | ICD-10-CM | POA: Diagnosis not present

## 2017-04-12 DIAGNOSIS — C9190 Lymphoid leukemia, unspecified not having achieved remission: Secondary | ICD-10-CM | POA: Diagnosis not present

## 2017-04-12 DIAGNOSIS — Z79899 Other long term (current) drug therapy: Secondary | ICD-10-CM | POA: Diagnosis not present

## 2017-04-19 ENCOUNTER — Encounter (INDEPENDENT_AMBULATORY_CARE_PROVIDER_SITE_OTHER): Payer: Medicare Other | Admitting: Ophthalmology

## 2017-04-19 DIAGNOSIS — H43812 Vitreous degeneration, left eye: Secondary | ICD-10-CM | POA: Diagnosis not present

## 2017-04-19 DIAGNOSIS — H353221 Exudative age-related macular degeneration, left eye, with active choroidal neovascularization: Secondary | ICD-10-CM

## 2017-04-19 DIAGNOSIS — H353114 Nonexudative age-related macular degeneration, right eye, advanced atrophic with subfoveal involvement: Secondary | ICD-10-CM | POA: Diagnosis not present

## 2017-04-19 DIAGNOSIS — H35341 Macular cyst, hole, or pseudohole, right eye: Secondary | ICD-10-CM | POA: Diagnosis not present

## 2017-04-19 IMAGING — CR DG ABDOMEN 3V
1 series · 3 of 3 positions shown · non-contrast
Comparison: Abdominal radiograph December 06, 2008

CLINICAL DATA: Vomiting, nausea and diarrhea beginning last night.
Patient began Lasix last night. History of prostate cancer.

EXAM:
ABDOMEN SERIES

[Series 1: dxr abdomen 3-way (incl pa cxr) · 0.14mm/px · 3 of 3 slices shown]
[im 1/3]
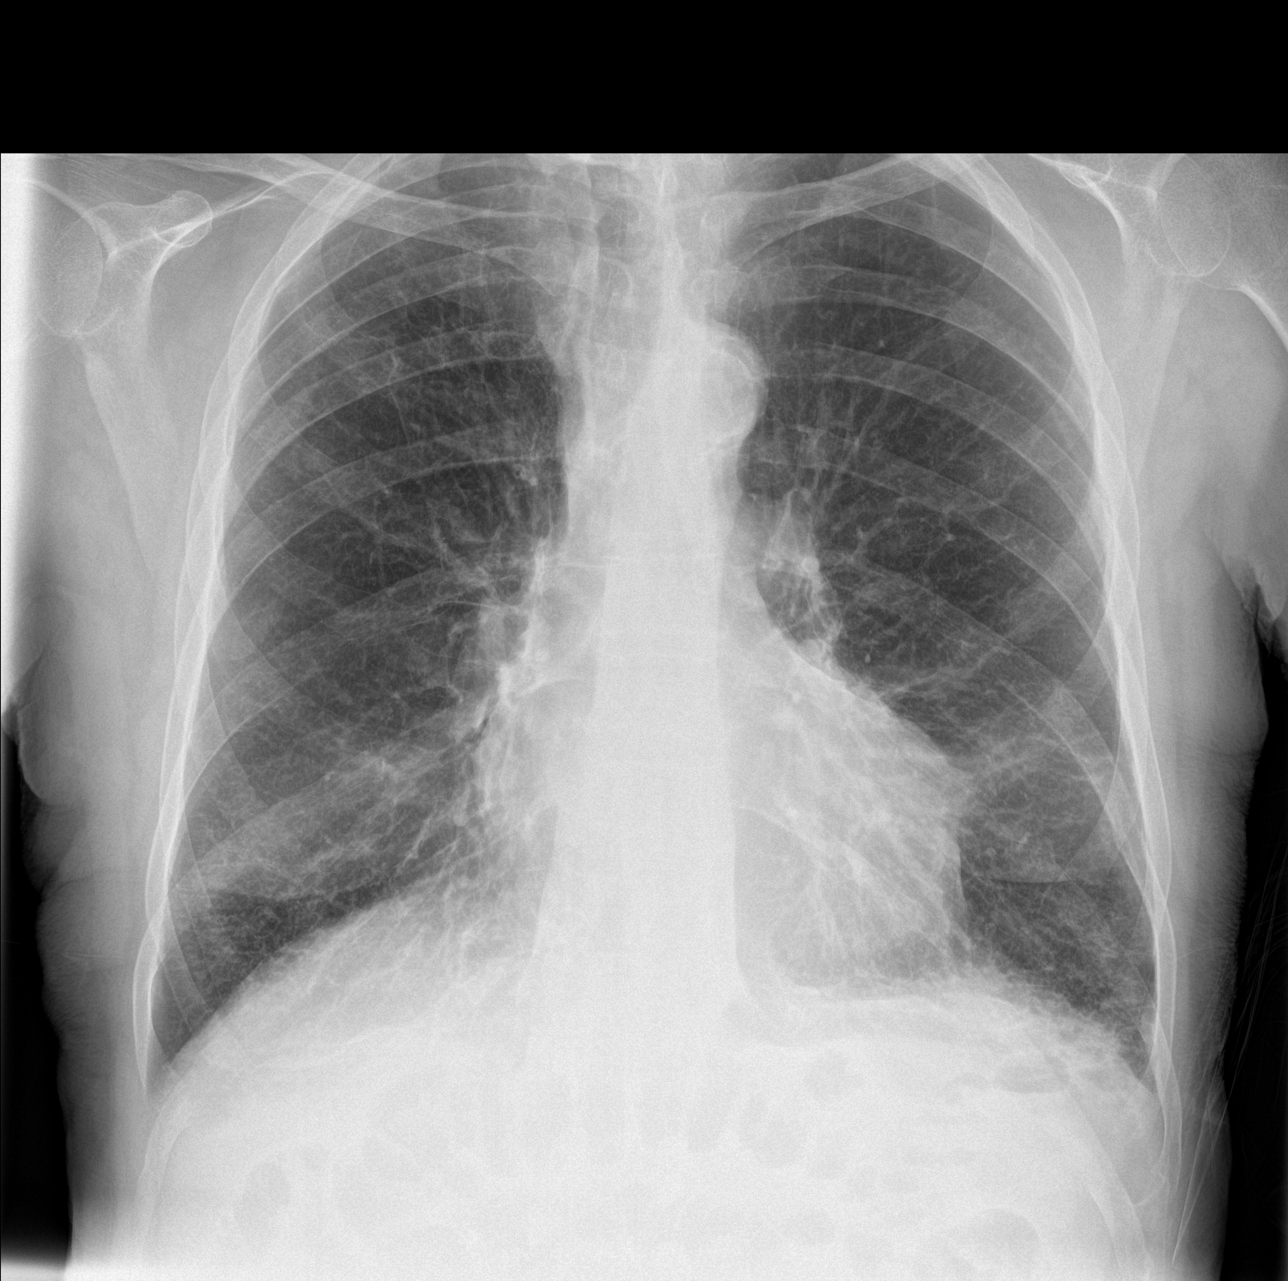
[im 2/3]
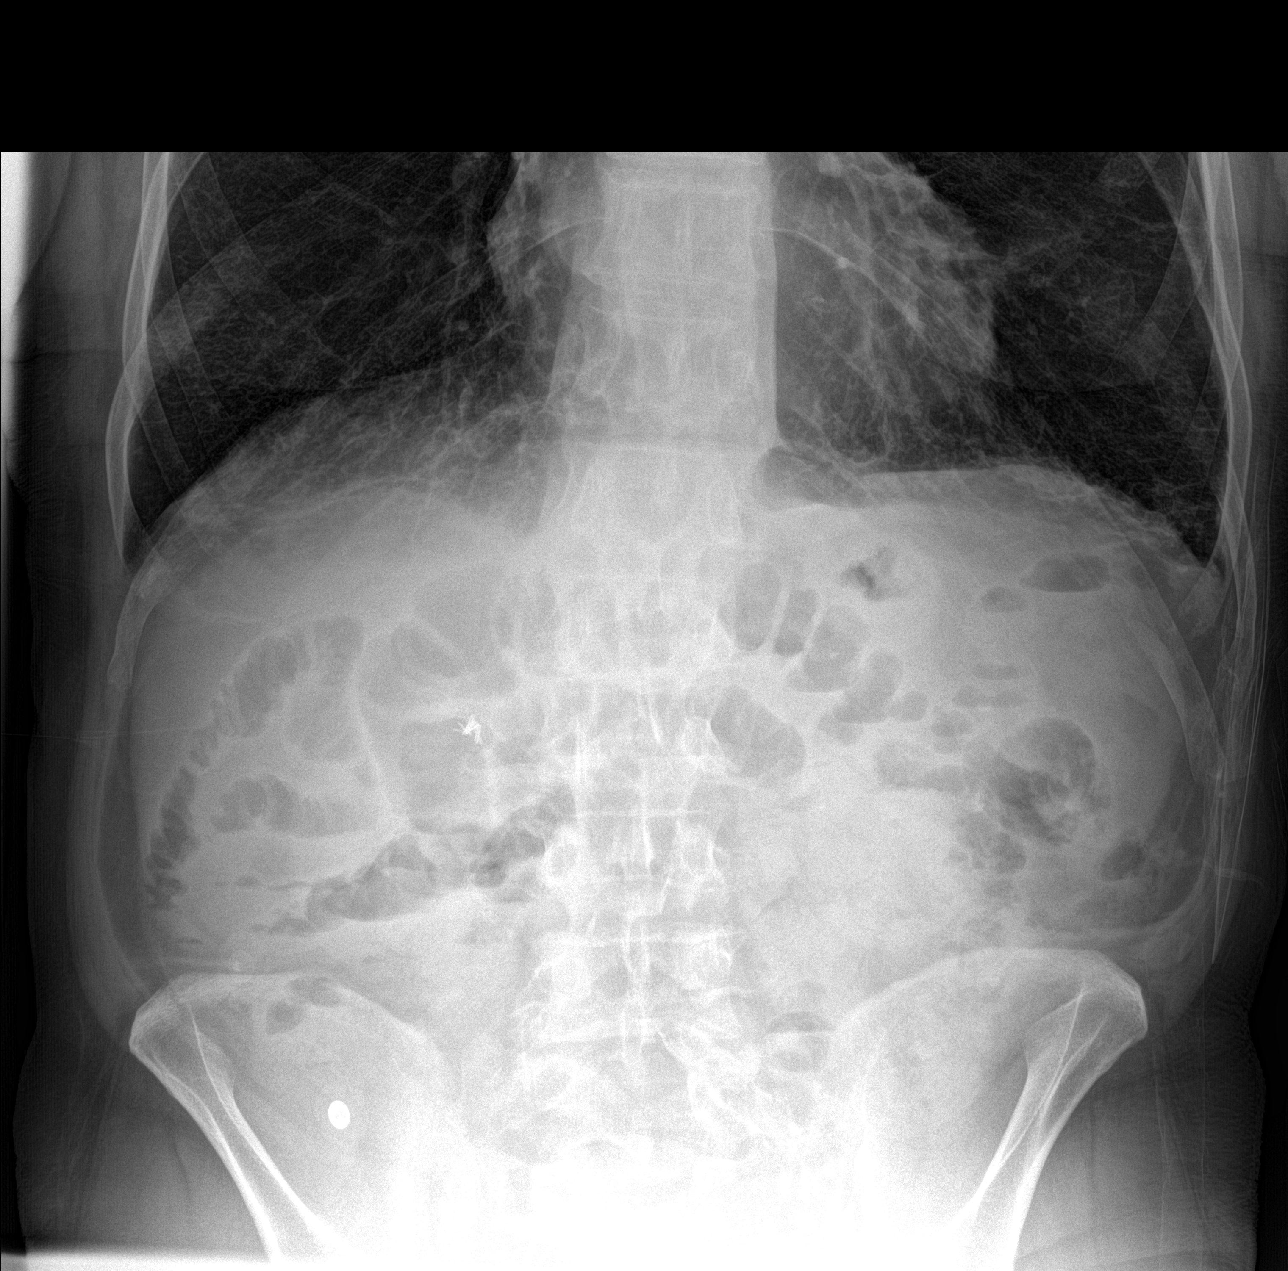
[im 3/3]
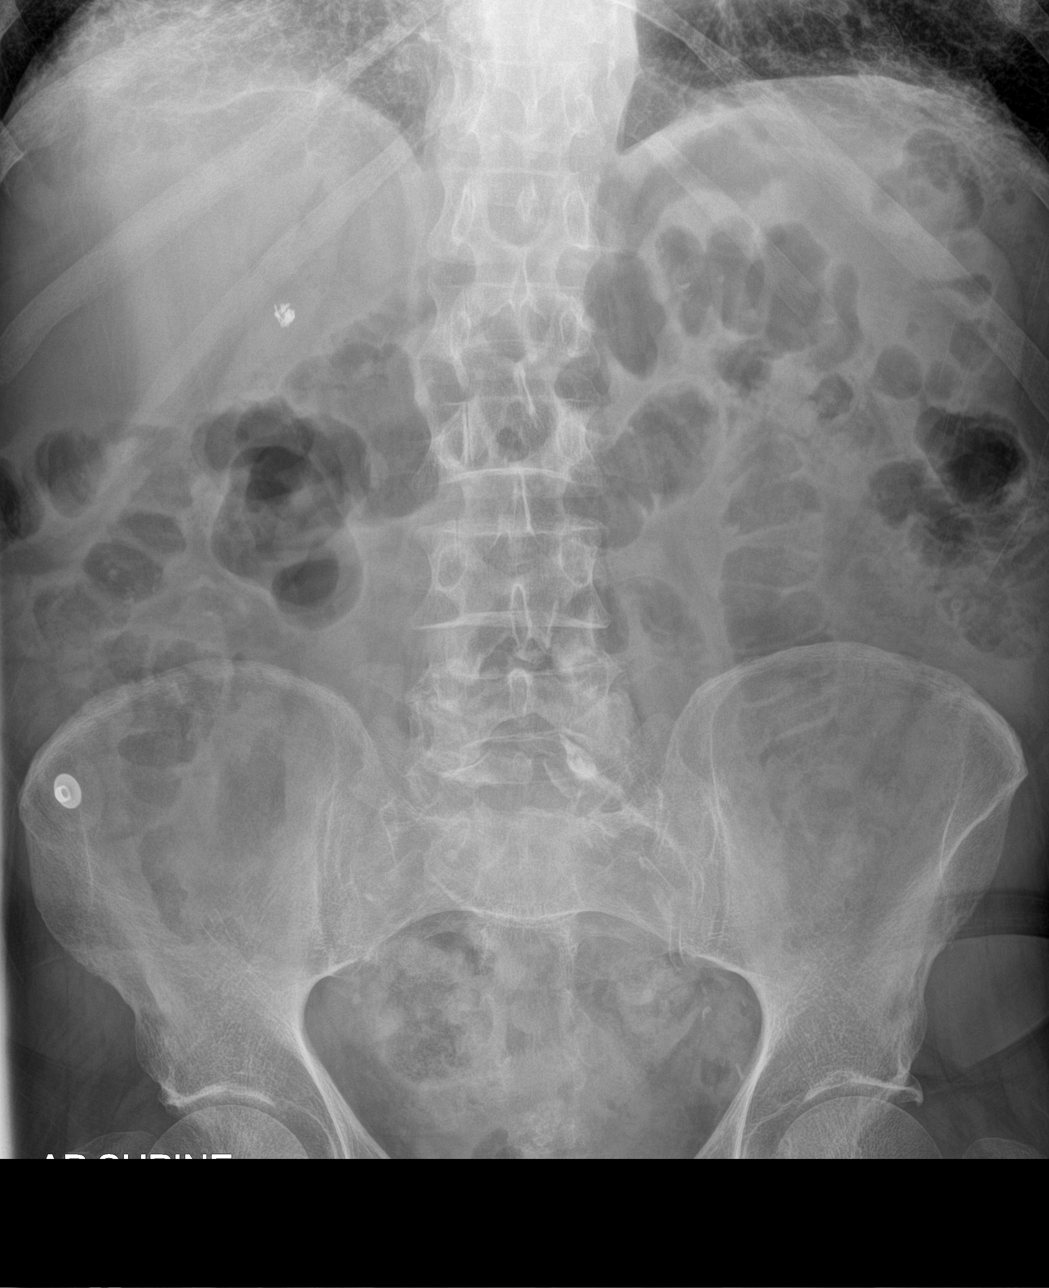

[3 of 3 positions shown; findings below may reference images not displayed]

FINDINGS: The cardiac silhouette is unremarkable. Mildly calcified aortic
knob. Mild chronic interstitial changes most prominent along bases
and increased lung volumes consistent with COPD. Strandy densities
in LEFT lung base without pleural effusion or focal consolidation.
No pneumothorax. Soft tissue planes and included osseous structures
are nonsuspicious.

Bowel gas pattern is nondilated and nonobstructive. No
intra-abdominal mass effect or pathologic indications. No free air.
Surgical clips in the included right abdomen likely reflect
cholecystectomy. Mild vascular calcifications. Soft tissue planes
and included osseous structures are nonsuspicious.
IMPRESSION: COPD and LEFT lung base atelectasis/scarring.

Normal bowel gas pattern.

By: Kwsta Esim

## 2017-04-25 DIAGNOSIS — J189 Pneumonia, unspecified organism: Secondary | ICD-10-CM | POA: Diagnosis not present

## 2017-05-10 DIAGNOSIS — I480 Paroxysmal atrial fibrillation: Secondary | ICD-10-CM | POA: Diagnosis not present

## 2017-05-10 DIAGNOSIS — C61 Malignant neoplasm of prostate: Secondary | ICD-10-CM | POA: Diagnosis not present

## 2017-05-17 DIAGNOSIS — C61 Malignant neoplasm of prostate: Secondary | ICD-10-CM | POA: Diagnosis not present

## 2017-05-18 ENCOUNTER — Inpatient Hospital Stay: Payer: Medicare Other | Attending: Internal Medicine

## 2017-05-18 ENCOUNTER — Inpatient Hospital Stay (HOSPITAL_BASED_OUTPATIENT_CLINIC_OR_DEPARTMENT_OTHER): Payer: Medicare Other | Admitting: Internal Medicine

## 2017-05-18 VITALS — BP 105/65 | HR 94 | Temp 98.1°F | Resp 20

## 2017-05-18 DIAGNOSIS — D509 Iron deficiency anemia, unspecified: Secondary | ICD-10-CM

## 2017-05-18 DIAGNOSIS — Z7901 Long term (current) use of anticoagulants: Secondary | ICD-10-CM

## 2017-05-18 DIAGNOSIS — I110 Hypertensive heart disease with heart failure: Secondary | ICD-10-CM | POA: Diagnosis not present

## 2017-05-18 DIAGNOSIS — M069 Rheumatoid arthritis, unspecified: Secondary | ICD-10-CM | POA: Insufficient documentation

## 2017-05-18 DIAGNOSIS — I509 Heart failure, unspecified: Secondary | ICD-10-CM | POA: Diagnosis not present

## 2017-05-18 DIAGNOSIS — D72829 Elevated white blood cell count, unspecified: Secondary | ICD-10-CM | POA: Diagnosis not present

## 2017-05-18 DIAGNOSIS — C61 Malignant neoplasm of prostate: Secondary | ICD-10-CM

## 2017-05-18 DIAGNOSIS — Z87891 Personal history of nicotine dependence: Secondary | ICD-10-CM

## 2017-05-18 DIAGNOSIS — E785 Hyperlipidemia, unspecified: Secondary | ICD-10-CM

## 2017-05-18 DIAGNOSIS — D500 Iron deficiency anemia secondary to blood loss (chronic): Secondary | ICD-10-CM

## 2017-05-18 DIAGNOSIS — Z79899 Other long term (current) drug therapy: Secondary | ICD-10-CM | POA: Diagnosis not present

## 2017-05-18 DIAGNOSIS — J449 Chronic obstructive pulmonary disease, unspecified: Secondary | ICD-10-CM

## 2017-05-18 DIAGNOSIS — I4891 Unspecified atrial fibrillation: Secondary | ICD-10-CM | POA: Insufficient documentation

## 2017-05-18 DIAGNOSIS — K219 Gastro-esophageal reflux disease without esophagitis: Secondary | ICD-10-CM

## 2017-05-18 DIAGNOSIS — D471 Chronic myeloproliferative disease: Secondary | ICD-10-CM

## 2017-05-18 LAB — CBC WITH DIFFERENTIAL/PLATELET
BAND NEUTROPHILS: 4 %
BASOS ABS: 0 10*3/uL (ref 0–0.1)
BASOS PCT: 0 %
Eosinophils Absolute: 0.2 10*3/uL (ref 0–0.7)
Eosinophils Relative: 1 %
HEMATOCRIT: 32 % — AB (ref 40.0–52.0)
HEMOGLOBIN: 10.7 g/dL — AB (ref 13.0–18.0)
LYMPHS PCT: 6 %
Lymphs Abs: 1 10*3/uL (ref 1.0–3.6)
MCH: 32.2 pg (ref 26.0–34.0)
MCHC: 33.6 g/dL (ref 32.0–36.0)
MCV: 95.9 fL (ref 80.0–100.0)
MONOS PCT: 24 %
Metamyelocytes Relative: 2 %
Monocytes Absolute: 4.1 10*3/uL — ABNORMAL HIGH (ref 0.2–1.0)
Neutro Abs: 11.7 10*3/uL — ABNORMAL HIGH (ref 1.4–6.5)
Neutrophils Relative %: 63 %
Platelets: 180 10*3/uL (ref 150–440)
RBC: 3.34 MIL/uL — ABNORMAL LOW (ref 4.40–5.90)
RDW: 17.5 % — ABNORMAL HIGH (ref 11.5–14.5)
WBC: 17 10*3/uL — ABNORMAL HIGH (ref 3.8–10.6)

## 2017-05-18 LAB — COMPREHENSIVE METABOLIC PANEL
ALBUMIN: 3.9 g/dL (ref 3.5–5.0)
ALT: 12 U/L — ABNORMAL LOW (ref 17–63)
ANION GAP: 8 (ref 5–15)
AST: 21 U/L (ref 15–41)
Alkaline Phosphatase: 100 U/L (ref 38–126)
BILIRUBIN TOTAL: 0.6 mg/dL (ref 0.3–1.2)
BUN: 15 mg/dL (ref 6–20)
CO2: 25 mmol/L (ref 22–32)
Calcium: 8.6 mg/dL — ABNORMAL LOW (ref 8.9–10.3)
Chloride: 106 mmol/L (ref 101–111)
Creatinine, Ser: 0.91 mg/dL (ref 0.61–1.24)
Glucose, Bld: 125 mg/dL — ABNORMAL HIGH (ref 65–99)
Potassium: 3.3 mmol/L — ABNORMAL LOW (ref 3.5–5.1)
Sodium: 139 mmol/L (ref 135–145)
TOTAL PROTEIN: 6.8 g/dL (ref 6.5–8.1)

## 2017-05-18 LAB — LACTATE DEHYDROGENASE: LDH: 146 U/L (ref 98–192)

## 2017-05-18 NOTE — Assessment & Plan Note (Addendum)
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 Today white count is 17,000; hemoglobin around 10 platelets normal. Patient is asymptomatic. Continue surveillance at this time.  # Anemia hemoglobin 10.7-likely secondary to above primary bone marrow process. Iron studies -no iron deficiency anemia. Stable. Monitor for now.   # /A.fib/CHF controlled- compensated.   # Prostate cancer early stage [follows up with urology; Dr.Tennenbaum; [place]]; patient interested in follow-up with [place]. Will get records; pt follow up here [pref]; check PSA next visit.  # Follow-up in 4 months with labs. The above plan of care was discussed with the patient and his daughter in detail.

## 2017-05-18 NOTE — Progress Notes (Signed)
[facility] OFFICE PROGRESS NOTE  Patient Care Team: Placido Sou, MD as PCP - General (Family Medicine) Allyne Gee, MD (Internal Medicine)   SUMMARY OF HEMATOLOGIC/ONCOLOGIC HISTORY:  # Leucocytosis- 20-30K [Dr.Pandit-BMBx 2013-No definitive features of a Myeloproliferative neoplasia including CMML, normal cytogenetics (35 XY. Normocellular to focally mildly hypocellular marrow for age (30-50%) with trilineage hematopoiesis, no overt monocytosis ; Flow study unremarkable. Peripheral blood flow- suggestive of chronic myelomonocytic leukemia]; JAK2/Bcr-Abl-NEG.2013- [place]- No hepato-splenomegaly.   # IDA- ? Etiology.  # RA on MXT; prednisone 5mg  discon; Prostate cancer [early stage] f/u Dr.Tennenbaum [place].   INTERVAL HISTORY:  A very pleasant 83 -year-old male patient with a prior history of long-standing leukocytosis and mild anemia is here for follow-up.  He is accompanied by his daughter.  In the interim he was evaluated by urology in [place]- had to "shot in the arm". He states his PSA is negligible. He had a big bruise from his injection.  He has not had any hospitalizations recently.  Today he feels good. Denies any significant fatigue. His appetite is good. No weight loss. No night sweats no fevers. Denies any chest pain shortness of breath or cough.  He denies any swelling in the legs.  REVIEW OF SYSTEMS:  A complete 10 point review of system is done which is negative except mentioned above/history of present illness.   PAST MEDICAL HISTORY :  Past Medical History:  Diagnosis Date  . Arthritis   . Atrial fibrillation ([place])   . CHF (congestive heart failure) ([place])   . COPD (chronic obstructive pulmonary disease) ([place])   . Dysrhythmia   . GERD (gastroesophageal reflux disease)   . Hyperlipemia   . Hypertension   . Leukocytosis 01/14/2016  . Prostate cancer ([place])     PAST SURGICAL HISTORY :   Past Surgical History:  Procedure Laterality Date  .  CHOLECYSTECTOMY    . COLONOSCOPY WITH PROPOFOL N/A 04/04/2015   Procedure: COLONOSCOPY WITH PROPOFOL;  Surgeon: Manya Silvas, MD;  Location: [facility] ENDOSCOPY;  Service: Endoscopy;  Laterality: N/A;  . ESOPHAGOGASTRODUODENOSCOPY N/A 04/04/2015   Procedure: ESOPHAGOGASTRODUODENOSCOPY (EGD);  Surgeon: Manya Silvas, MD;  [facility] ENDOSCOPY;  Service: Endoscopy;  Laterality: N/A;    FAMILY HISTORY :   Family History  Problem Relation Age of Onset  . Hypertension Other     SOCIAL HISTORY:   Social History  Substance Use Topics  . Smoking status: Former Smoker    Years: 20.00    Types: Cigarettes  . Smokeless tobacco: Never Used  . Alcohol use 0.0 oz/week     Comment: Wine every once in a while    ALLERGIES:  has No Known Allergies.  MEDICATIONS:  Current Outpatient Prescriptions  Medication Sig Dispense Refill  . Besifloxacin HCl (BESIVANCE) 0.6 % SUSP Apply to eye.    Marland Kitchen BREO ELLIPTA 100-25 MCG/INH AEPB Inhale 1 puff into the lungs every morning.    . digoxin (LANOXIN) 0.125 MG tablet Take 0.125 mg by mouth every morning.     . ferrous sulfate 325 (65 FE) MG tablet Take 325 mg by mouth every morning. Reported on 0/97/3532    . folic acid (FOLVITE) 1 MG tablet Take 1 mg by mouth daily.    . furosemide (LASIX) 20 MG tablet Take 20 mg by mouth every other day. In the morning    . methotrexate (RHEUMATREX) 2.5 MG tablet TAKE 4 TABLETS (15 MG TOTAL) BY MOUTH EVERY 7 (SEVEN) DAYS.  4  .  montelukast (SINGULAIR) 10 MG tablet Take 10 mg by mouth daily.     . Multiple Vitamins-Minerals (PRESERVISION AREDS PO) Take 1 tablet by mouth every morning.     . warfarin (COUMADIN) 4 MG tablet Take 4 mg by mouth daily.    Marland Kitchen albuterol (PROVENTIL HFA;VENTOLIN HFA) 108 (90 Base) MCG/ACT inhaler Inhale 2 puffs into the lungs every 6 (six) hours as needed for wheezing or shortness of breath. (Patient not taking: Reported on 05/18/2017) 8 g 0   No current facility-administered medications for this  visit.     PHYSICAL EXAMINATION: ECOG PERFORMANCE STATUS: \  BP 105/65   Pulse 94   Temp 98.1 F (36.7 C) (Tympanic)   Resp 20   There were no vitals filed for this visit.  GENERAL: Well-nourished well-developed; Alert, no distress and comfortable.  Accompanied by his daughter EYES: no pallor or icterus OROPHARYNX: no thrush or ulceration; dentures.  NECK: supple, no masses felt LYMPH:  no palpable lymphadenopathy in the cervical, axillary or inguinal regions LUNGS: clear to auscultation and  No wheeze or crackles HEART/CVS:irregular rate & rhythm and no murmurs; No lower extremity edema ABDOMEN:abdomen soft, non-tender and normal bowel sounds Musculoskeletal:no cyanosis of digits and no clubbing  PSYCH: alert & oriented x 3 with fluent speech NEURO: no focal motor/sensory deficits SKIN:  Big bruise on his left arm. LABORATORY DATA:  I have reviewed the data as listed    Component Value Date/Time   NA 139 05/18/2017 1439   NA 140 01/22/2015 0345   K 3.3 (L) 05/18/2017 1439   K 3.7 01/22/2015 0345   CL 106 05/18/2017 1439   CL 106 01/22/2015 0345   CO2 25 05/18/2017 1439   CO2 25 01/22/2015 0345   GLUCOSE 125 (H) 05/18/2017 1439   GLUCOSE 89 01/22/2015 0345   BUN 15 05/18/2017 1439   BUN 16 01/22/2015 0345   CREATININE 0.91 05/18/2017 1439   CREATININE 0.88 01/22/2015 0345   CALCIUM 8.6 (L) 05/18/2017 1439   CALCIUM 7.7 (L) 01/22/2015 0345   PROT 6.8 05/18/2017 1439   PROT 6.8 01/21/2015 1946   ALBUMIN 3.9 05/18/2017 1439   ALBUMIN 3.3 (L) 01/21/2015 1946   AST 21 05/18/2017 1439   AST 20 01/21/2015 1946   ALT 12 (L) 05/18/2017 1439   ALT 6 (L) 01/21/2015 1946   ALKPHOS 100 05/18/2017 1439   ALKPHOS 86 01/21/2015 1946   BILITOT 0.6 05/18/2017 1439   BILITOT 0.9 01/21/2015 1946   GFRNONAA >60 05/18/2017 1439   GFRNONAA >60 01/22/2015 0345   GFRAA >60 05/18/2017 1439   GFRAA >60 01/22/2015 0345    No results found for: SPEP, UPEP  Lab Results  Component  Value Date   WBC 17.0 (H) 05/18/2017   NEUTROABS 11.7 (H) 05/18/2017   HGB 10.7 (L) 05/18/2017   HCT 32.0 (L) 05/18/2017   MCV 95.9 05/18/2017   PLT 180 05/18/2017      Chemistry      Component Value Date/Time   NA 139 05/18/2017 1439   NA 140 01/22/2015 0345   K 3.3 (L) 05/18/2017 1439   K 3.7 01/22/2015 0345   CL 106 05/18/2017 1439   CL 106 01/22/2015 0345   CO2 25 05/18/2017 1439   CO2 25 01/22/2015 0345   BUN 15 05/18/2017 1439   BUN 16 01/22/2015 0345   CREATININE 0.91 05/18/2017 1439   CREATININE 0.88 01/22/2015 0345      Component Value Date/Time   CALCIUM 8.6 (L)  05/18/2017 1439   CALCIUM 7.7 (L) 01/22/2015 0345   ALKPHOS 100 05/18/2017 1439   ALKPHOS 86 01/21/2015 1946   AST 21 05/18/2017 1439   AST 20 01/21/2015 1946   ALT 12 (L) 05/18/2017 1439   ALT 6 (L) 01/21/2015 1946   BILITOT 0.6 05/18/2017 1439   BILITOT 0.9 01/21/2015 1946         ASSESSMENT & PLAN:   Myeloproliferative neoplasm (HCC) # Leukocytosis predominant neutrophilia- again unclear etiology- 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 Today white count is 17,000; hemoglobin around 10 platelets normal. Patient is asymptomatic. Continue surveillance at this time.  # Anemia hemoglobin 10.7-likely secondary to above primary bone marrow process. Iron studies -no iron deficiency anemia. Stable. Monitor for now.   # /A.fib/CHF controlled- compensated.   # Prostate cancer early stage [follows up with urology; Dr.Tennenbaum; [place]]; patient interested in follow-up with [place]. Will get records; pt follow up here [pref]; check PSA next visit.  # Follow-up in 4 months with labs. The above plan of care was discussed with the patient and his daughter in detail.   Cammie Sickle, MD 05/19/2017 12:20 AM

## 2017-05-26 DIAGNOSIS — J189 Pneumonia, unspecified organism: Secondary | ICD-10-CM | POA: Diagnosis not present

## 2017-06-02 ENCOUNTER — Encounter (INDEPENDENT_AMBULATORY_CARE_PROVIDER_SITE_OTHER): Payer: Medicare Other | Admitting: Ophthalmology

## 2017-06-02 DIAGNOSIS — H43813 Vitreous degeneration, bilateral: Secondary | ICD-10-CM

## 2017-06-02 DIAGNOSIS — H353221 Exudative age-related macular degeneration, left eye, with active choroidal neovascularization: Secondary | ICD-10-CM | POA: Diagnosis not present

## 2017-06-02 DIAGNOSIS — H35341 Macular cyst, hole, or pseudohole, right eye: Secondary | ICD-10-CM | POA: Diagnosis not present

## 2017-06-02 DIAGNOSIS — H353114 Nonexudative age-related macular degeneration, right eye, advanced atrophic with subfoveal involvement: Secondary | ICD-10-CM | POA: Diagnosis not present

## 2017-06-07 ENCOUNTER — Encounter (INDEPENDENT_AMBULATORY_CARE_PROVIDER_SITE_OTHER): Payer: Medicare Other | Admitting: Ophthalmology

## 2017-06-26 DIAGNOSIS — J189 Pneumonia, unspecified organism: Secondary | ICD-10-CM | POA: Diagnosis not present

## 2017-06-29 ENCOUNTER — Emergency Department: Payer: Medicare Other

## 2017-06-29 ENCOUNTER — Encounter: Payer: Self-pay | Admitting: *Deleted

## 2017-06-29 ENCOUNTER — Inpatient Hospital Stay
Admission: EM | Admit: 2017-06-29 | Discharge: 2017-07-03 | DRG: 871 | Disposition: A | Discharge status: Home / Self Care | Payer: Medicare Other | Attending: Internal Medicine | Admitting: Internal Medicine

## 2017-06-29 DIAGNOSIS — J449 Chronic obstructive pulmonary disease, unspecified: Secondary | ICD-10-CM | POA: Diagnosis not present

## 2017-06-29 DIAGNOSIS — E785 Hyperlipidemia, unspecified: Secondary | ICD-10-CM | POA: Diagnosis present

## 2017-06-29 DIAGNOSIS — J9621 Acute and chronic respiratory failure with hypoxia: Secondary | ICD-10-CM | POA: Diagnosis present

## 2017-06-29 DIAGNOSIS — J189 Pneumonia, unspecified organism: Secondary | ICD-10-CM

## 2017-06-29 DIAGNOSIS — Z87891 Personal history of nicotine dependence: Secondary | ICD-10-CM

## 2017-06-29 DIAGNOSIS — Z856 Personal history of leukemia: Secondary | ICD-10-CM | POA: Diagnosis not present

## 2017-06-29 DIAGNOSIS — R531 Weakness: Secondary | ICD-10-CM | POA: Diagnosis not present

## 2017-06-29 DIAGNOSIS — G9341 Metabolic encephalopathy: Secondary | ICD-10-CM | POA: Diagnosis present

## 2017-06-29 DIAGNOSIS — R404 Transient alteration of awareness: Secondary | ICD-10-CM | POA: Diagnosis not present

## 2017-06-29 DIAGNOSIS — Y95 Nosocomial condition: Secondary | ICD-10-CM | POA: Diagnosis present

## 2017-06-29 DIAGNOSIS — D638 Anemia in other chronic diseases classified elsewhere: Secondary | ICD-10-CM | POA: Diagnosis present

## 2017-06-29 DIAGNOSIS — A419 Sepsis, unspecified organism: Secondary | ICD-10-CM | POA: Diagnosis not present

## 2017-06-29 DIAGNOSIS — Z7901 Long term (current) use of anticoagulants: Secondary | ICD-10-CM

## 2017-06-29 DIAGNOSIS — I10 Essential (primary) hypertension: Secondary | ICD-10-CM | POA: Diagnosis not present

## 2017-06-29 DIAGNOSIS — H3530 Unspecified macular degeneration: Secondary | ICD-10-CM | POA: Diagnosis present

## 2017-06-29 DIAGNOSIS — I5032 Chronic diastolic (congestive) heart failure: Secondary | ICD-10-CM | POA: Diagnosis not present

## 2017-06-29 DIAGNOSIS — I4891 Unspecified atrial fibrillation: Secondary | ICD-10-CM | POA: Diagnosis present

## 2017-06-29 DIAGNOSIS — Z66 Do not resuscitate: Secondary | ICD-10-CM | POA: Diagnosis present

## 2017-06-29 DIAGNOSIS — Z79899 Other long term (current) drug therapy: Secondary | ICD-10-CM

## 2017-06-29 DIAGNOSIS — I110 Hypertensive heart disease with heart failure: Secondary | ICD-10-CM | POA: Diagnosis not present

## 2017-06-29 DIAGNOSIS — Z9981 Dependence on supplemental oxygen: Secondary | ICD-10-CM

## 2017-06-29 DIAGNOSIS — R410 Disorientation, unspecified: Secondary | ICD-10-CM | POA: Diagnosis not present

## 2017-06-29 DIAGNOSIS — R918 Other nonspecific abnormal finding of lung field: Secondary | ICD-10-CM | POA: Diagnosis not present

## 2017-06-29 DIAGNOSIS — H9190 Unspecified hearing loss, unspecified ear: Secondary | ICD-10-CM | POA: Diagnosis present

## 2017-06-29 DIAGNOSIS — J690 Pneumonitis due to inhalation of food and vomit: Secondary | ICD-10-CM | POA: Diagnosis not present

## 2017-06-29 DIAGNOSIS — R509 Fever, unspecified: Secondary | ICD-10-CM | POA: Diagnosis not present

## 2017-06-29 DIAGNOSIS — R4182 Altered mental status, unspecified: Secondary | ICD-10-CM

## 2017-06-29 DIAGNOSIS — M069 Rheumatoid arthritis, unspecified: Secondary | ICD-10-CM | POA: Diagnosis not present

## 2017-06-29 DIAGNOSIS — C61 Malignant neoplasm of prostate: Secondary | ICD-10-CM | POA: Diagnosis present

## 2017-06-29 DIAGNOSIS — K219 Gastro-esophageal reflux disease without esophagitis: Secondary | ICD-10-CM | POA: Diagnosis present

## 2017-06-29 DIAGNOSIS — Z515 Encounter for palliative care: Secondary | ICD-10-CM | POA: Diagnosis not present

## 2017-06-29 LAB — CBC WITH DIFFERENTIAL/PLATELET
Basophils Absolute: 0 10*3/uL (ref 0–0.1)
Basophils Relative: 0 %
EOS ABS: 0 10*3/uL (ref 0–0.7)
Eosinophils Relative: 0 %
HEMATOCRIT: 31.2 % — AB (ref 40.0–52.0)
Hemoglobin: 10.4 g/dL — ABNORMAL LOW (ref 13.0–18.0)
LYMPHS ABS: 1.5 10*3/uL (ref 1.0–3.6)
Lymphocytes Relative: 4 %
MCH: 32.2 pg (ref 26.0–34.0)
MCHC: 33.4 g/dL (ref 32.0–36.0)
MCV: 96.5 fL (ref 80.0–100.0)
MONO ABS: 6.6 10*3/uL — AB (ref 0.2–1.0)
MONOS PCT: 18 %
NEUTROS ABS: 28.7 10*3/uL — AB (ref 1.4–6.5)
Neutrophils Relative %: 78 %
PLATELETS: 308 10*3/uL (ref 150–440)
RBC: 3.24 MIL/uL — AB (ref 4.40–5.90)
RDW: 17.6 % — AB (ref 11.5–14.5)
WBC: 36.8 10*3/uL — AB (ref 3.8–10.6)

## 2017-06-29 LAB — COMPREHENSIVE METABOLIC PANEL
ALK PHOS: 92 U/L (ref 38–126)
ALT: 11 U/L — ABNORMAL LOW (ref 17–63)
AST: 24 U/L (ref 15–41)
Albumin: 3.9 g/dL (ref 3.5–5.0)
Anion gap: 11 (ref 5–15)
BUN: 16 mg/dL (ref 6–20)
CALCIUM: 9.1 mg/dL (ref 8.9–10.3)
CO2: 25 mmol/L (ref 22–32)
CREATININE: 1.01 mg/dL (ref 0.61–1.24)
Chloride: 103 mmol/L (ref 101–111)
Glucose, Bld: 99 mg/dL (ref 65–99)
Potassium: 3.8 mmol/L (ref 3.5–5.1)
Sodium: 139 mmol/L (ref 135–145)
Total Bilirubin: 0.5 mg/dL (ref 0.3–1.2)
Total Protein: 7.7 g/dL (ref 6.5–8.1)

## 2017-06-29 LAB — LIPASE, BLOOD: LIPASE: 29 U/L (ref 11–51)

## 2017-06-29 LAB — LACTIC ACID, PLASMA
LACTIC ACID, VENOUS: 3 mmol/L — AB (ref 0.5–1.9)
Lactic Acid, Venous: 2.7 mmol/L (ref 0.5–1.9)

## 2017-06-29 LAB — PROCALCITONIN: Procalcitonin: <0.1 ng/mL

## 2017-06-29 LAB — TROPONIN I: Troponin I: <0.03 ng/mL (ref <0.03)

## 2017-06-29 LAB — APTT: APTT: 35 s (ref 24–36)

## 2017-06-29 LAB — PROTIME-INR
INR: 2.77
Prothrombin Time: 29 seconds — ABNORMAL HIGH (ref 11.4–15.2)

## 2017-06-29 MED ORDER — VANCOMYCIN HCL IN DEXTROSE 1-5 GM/200ML-% IV SOLN
1000.0000 mg | INTRAVENOUS | Status: DC
  Administered 2017-06-30: 1000 mg via INTRAVENOUS
  Filled 2017-06-29 (×2): qty 200

## 2017-06-29 MED ORDER — ACETAMINOPHEN 650 MG RE SUPP
RECTAL | Status: AC
  Administered 2017-06-29: 650 mg via RECTAL
  Filled 2017-06-29: qty 1

## 2017-06-29 MED ORDER — PIPERACILLIN-TAZOBACTAM 3.375 G IVPB 30 MIN
3.3750 g | Freq: Once | INTRAVENOUS | Status: AC
  Administered 2017-06-29: 3.375 g via INTRAVENOUS

## 2017-06-29 MED ORDER — PIPERACILLIN-TAZOBACTAM 3.375 G IVPB
3.3750 g | Freq: Three times a day (TID) | INTRAVENOUS | Status: DC
  Administered 2017-06-30 – 2017-07-03 (×11): 3.375 g via INTRAVENOUS
  Filled 2017-06-29 (×11): qty 50

## 2017-06-29 MED ORDER — LEVOFLOXACIN IN D5W 750 MG/150ML IV SOLN
750.0000 mg | Freq: Once | INTRAVENOUS | Status: AC
  Administered 2017-06-29: 750 mg via INTRAVENOUS
  Filled 2017-06-29: qty 150

## 2017-06-29 MED ORDER — VANCOMYCIN HCL IN DEXTROSE 1-5 GM/200ML-% IV SOLN
1000.0000 mg | Freq: Once | INTRAVENOUS | Status: AC
  Administered 2017-06-29: 1000 mg via INTRAVENOUS
  Filled 2017-06-29: qty 200

## 2017-06-29 MED ORDER — SODIUM CHLORIDE 0.9 % IV BOLUS (SEPSIS)
1000.0000 mL | Freq: Once | INTRAVENOUS | Status: AC
  Administered 2017-06-29: 1000 mL via INTRAVENOUS

## 2017-06-29 MED ORDER — PIPERACILLIN-TAZOBACTAM 3.375 G IVPB 30 MIN
INTRAVENOUS | Status: AC
  Administered 2017-06-29: 3.375 g via INTRAVENOUS
  Filled 2017-06-29: qty 50

## 2017-06-29 MED ORDER — ACETAMINOPHEN 650 MG RE SUPP
650.0000 mg | Freq: Once | RECTAL | Status: AC
  Administered 2017-06-29: 650 mg via RECTAL

## 2017-06-29 MED ORDER — SODIUM CHLORIDE 0.9 % IV BOLUS (SEPSIS)
250.0000 mL | Freq: Once | INTRAVENOUS | Status: AC
  Administered 2017-06-29: 250 mL via INTRAVENOUS

## 2017-06-29 NOTE — ED Notes (Signed)
Admission MD at bedside.  

## 2017-06-29 NOTE — Progress Notes (Signed)
Pharmacy Antibiotic Note  Thomas Townsend is a 83 y.o. male admitted on 06/29/2017 with sepsis.  Pharmacy has been consulted for Zosyn and vancomycin dosing.  Plan: 1. Zosyn 3.375 gm IV Q8H EI 2. Vancomycin 1 gm IV x 1 in ED followed in approximately 9 hours (stacked dosing) by vancomycin 1 gm IV Q18H, predicted trough 16 mcg/mL. Pharmacy will continue to follow and adjust as needed to maintain trough 15 to 20 mcg/mL.   Vd 46.3 L, Ke 0.047 hr-1, T1/2 14.6 hr  Weight: 157 lb (71.2 kg)  Temp (24hrs), Avg:103.1 F (39.5 C), Min:102.5 F (39.2 C), Max:103.7 F (39.8 C)   Recent Labs Lab 06/29/17 1905  WBC 36.8*  CREATININE 1.01  LATICACIDVEN 2.7*    Estimated Creatinine Clearance: 51.8 mL/min (by C-G formula based on SCr of 1.01 mg/dL).    No Known Allergies  Thank you for allowing pharmacy to be a part of this patient's care.  Laural Benes, Pharm.D., BCPS Clinical Pharmacist 06/29/2017 8:25 PM

## 2017-06-29 NOTE — ED Notes (Signed)
[facility] blue top- Delilah Shan RN aware Lorriane Shire Charge aware)

## 2017-06-29 NOTE — ED Triage Notes (Signed)
Pt to ED from home via EMS after son reported having heard pt "moaning" in bedroom. Pt is moaning and SOB upon arrival to ED but when asked if he is in pain pt deneis feeling any pain. Pt has been congested with a productive cough and fever at home. Son reports pt can carry on a conversation and is independent at baseline. Pt at this time can not carry a conversation and will not answer all questions when asked. Pt is alert to self but disoriented to place, time and situation.

## 2017-06-29 NOTE — ED Provider Notes (Signed)
[facility] Emergency Department Provider Note  ____________________________________________  Time seen: Approximately 7:33 PM  I have reviewed the triage vital signs and the nursing notes.   HISTORY  Chief Complaint Altered Mental Status and Shortness of Breath  Level 5 Caveat: Portions of the History and Physical were unable to be obtained due to altered mental status.   HPI Thomas Townsend is a 83 y.o. male brought to the ED due to moaning and altered mental status at home. Also noted to be short of breath, with a productive cough. Patient denies pain.     Past Medical History:  Diagnosis Date  . Arthritis   . Atrial fibrillation ([place])   . CHF (congestive heart failure) ([place])   . COPD (chronic obstructive pulmonary disease) ([place])   . Dysrhythmia   . GERD (gastroesophageal reflux disease)   . Hyperlipemia   . Hypertension   . Leukocytosis 01/14/2016  . Prostate cancer [facility])      Patient Active Problem List   Diagnosis Date Noted  . Acute metabolic encephalopathy 37/62/8315  . Aspiration pneumonia ([place]) 03/22/2017  . Right upper lobe pneumonia ([place]) 10/05/2016  . Chronic neutrophilia 08/26/2016  . Iron deficiency anemia due to chronic blood loss 08/26/2016  . Myeloproliferative neoplasm ([place]) 06/25/2016  . Anemia due to other cause 06/25/2016  . Diarrhea 02/26/2016  . Leukocytosis 01/14/2016  . Septic shock ([place]) 08/11/2015  . Acidosis, lactic 08/11/2015  . Acute respiratory failure with hypoxia ([place]) 08/11/2015  . Encephalopathy, metabolic 17/61/6073  . Pneumonia 08/11/2015  . Pulmonary hypertension ([place]) 08/11/2015  . Mild aortic stenosis 08/11/2015  . Generalized weakness 08/11/2015  . Anemia of chronic disease 08/11/2015  . Sepsis ([place]) 08/06/2015  . Atrial fibrillation with rapid ventricular response ([place]) 08/06/2015  . CAP (community acquired pneumonia) 08/06/2015  . Rheumatoid arthritis involving multiple joints ([place])  01/28/2015  . Arthritis or polyarthritis, rheumatoid ([place]) 01/28/2015  . A-fib ([place]) 03/04/2014  . CAFL (chronic airflow limitation) ([place]) 03/04/2014  . Acid reflux 03/04/2014  . Chronic diastolic CHF (congestive heart failure) ([place]) 03/04/2014  . Atrial fibrillation ([place]) 03/04/2014  . COPD (chronic obstructive pulmonary disease) ([place]) 03/04/2014     Past Surgical History:  Procedure Laterality Date  . CHOLECYSTECTOMY    . COLONOSCOPY WITH PROPOFOL N/A 04/04/2015   Procedure: COLONOSCOPY WITH PROPOFOL;  Surgeon: Manya Silvas, MD;  Location: [facility] ENDOSCOPY;  Service: Endoscopy;  Laterality: N/A;  . ESOPHAGOGASTRODUODENOSCOPY N/A 04/04/2015   Procedure: ESOPHAGOGASTRODUODENOSCOPY (EGD);  Surgeon: Manya Silvas, MD;  Location: [facility] ENDOSCOPY;  Service: Endoscopy;  Laterality: N/A;     Prior to Admission medications   Medication Sig Start Date End Date Taking? Authorizing Provider  albuterol (PROVENTIL HFA;VENTOLIN HFA) 108 (90 Base) MCG/ACT inhaler Inhale 2 puffs into the lungs every 6 (six) hours as needed for wheezing or shortness of breath. Patient not taking: Reported on 05/18/2017 10/07/16   Hillary Bow, MD  Besifloxacin HCl (BESIVANCE) 0.6 % SUSP Apply to eye.    [provider]  BREO ELLIPTA 100-25 MCG/INH AEPB Inhale 1 puff into the lungs every morning. 01/28/15   [provider]  digoxin (LANOXIN) 0.125 MG tablet Take 0.125 mg by mouth every morning.  02/22/14   [provider]  ferrous sulfate 325 (65 FE) MG tablet Take 325 mg by mouth every morning. Reported on 02/25/2016    [provider]  folic acid (FOLVITE) 1 MG tablet Take 1 mg by mouth daily.    [provider]  furosemide (LASIX) 20 MG tablet Take 20 mg by mouth every other day. In the morning    [provider]  methotrexate (RHEUMATREX) 2.5 MG tablet TAKE 4 TABLETS (15 MG TOTAL) BY MOUTH EVERY 7 (SEVEN) DAYS. 12/24/15   [provider]  montelukast  (SINGULAIR) 10 MG tablet Take 10 mg by mouth daily.     [provider]  Multiple Vitamins-Minerals (PRESERVISION AREDS PO) Take 1 tablet by mouth every morning.     [provider]  warfarin (COUMADIN) 4 MG tablet Take 4 mg by mouth daily.    [provider]     Allergies Patient has no known allergies.   Family History  Problem Relation Age of Onset  . Hypertension Other     Social History Social History  Substance Use Topics  . Smoking status: Former Smoker    Years: 20.00    Types: Cigarettes  . Smokeless tobacco: Never Used  . Alcohol use 0.0 oz/week     Comment: Wine every once in a while    Review of Systems unable to reliably obtained due to altered mental status ____________________________________________   PHYSICAL EXAM:  VITAL SIGNS: ED Triage Vitals  Enc Vitals Group     BP 06/29/17 1857 (!) 158/68     Pulse Rate 06/29/17 1857 (!) 101     Resp 06/29/17 1857 (!) 40     Temp 06/29/17 1857 (!) 102.5 F (39.2 C)     Temp Source 06/29/17 1857 Axillary     SpO2 06/29/17 1847 (!) 88 %     Weight 06/29/17 1849 157 lb (71.2 kg)     Height --      Head Circumference --      Peak Flow --      Pain Score 06/29/17 1847 0     Pain Loc --      Pain Edu? --      Excl. in [place]? --     Vital signs reviewed, nursing assessments reviewed.   Constitutional:   awake alert oriented to self. not in distress Eyes:   No scleral icterus.  EOMI. No nystagmus. No conjunctival pallor. PERRL. ENT   Head:   Normocephalic and atraumatic.   Nose:   No congestion/rhinnorhea.    Mouth/Throat:   dry mucous membranes, no pharyngeal erythema. No peritonsillar mass.    Neck:   No meningismus. Full ROM Hematological/Lymphatic/Immunilogical:   No cervical lymphadenopathy. Cardiovascular:   tachycardia heart rate 100. Symmetric bilateral radial and DP pulses.  No murmurs.  Respiratory:   tachypnea. Breath sounds are clear and equal bilaterally.  No wheezes/rales/rhonchi. Gastrointestinal:   Soft and nontender. Non distended. There is no CVA tenderness.  No rebound, rigidity, or guarding. Genitourinary:   unremarkable Musculoskeletal:   Normal range of motion in all extremities. No joint effusions.  No lower extremity tenderness.  No edema. Neurologic:   only says "yeah" and "okay". Unable to converse or respond appropriately.  Motor grossly intact. No gross focal neurologic deficits are appreciated.  Skin:    Skin is warm, dry and intact. No rash noted.  No petechiae, purpura, or bullae.  ____________________________________________    LABS (pertinent positives/negatives) (all labs ordered are listed, but only abnormal results are displayed) Labs Reviewed  COMPREHENSIVE METABOLIC PANEL - Abnormal; Notable for the following:       Result Value   ALT 11 (*)    All other components within normal limits  CBC WITH DIFFERENTIAL/PLATELET - Abnormal;  Notable for the following:    WBC 36.8 (*)    RBC 3.24 (*)    Hemoglobin 10.4 (*)    HCT 31.2 (*)    RDW 17.6 (*)    Neutro Abs 28.7 (*)    Monocytes Absolute 6.6 (*)    All other components within normal limits  LACTIC ACID, PLASMA - Abnormal; Notable for the following:    Lactic Acid, Venous 2.7 (*)    All other components within normal limits  PROTIME-INR - Abnormal; Notable for the following:    Prothrombin Time 29.0 (*)    All other components within normal limits  CULTURE, BLOOD (ROUTINE X 2)  CULTURE, BLOOD (ROUTINE X 2)  URINE CULTURE  LIPASE, BLOOD  TROPONIN I  PROCALCITONIN  APTT  LACTIC ACID, PLASMA  LACTIC ACID, PLASMA  URINALYSIS, COMPLETE (UACMP) WITH MICROSCOPIC   ____________________________________________   EKG  interpreted by me Atrial fibrillation rate of 80, normal axis. Poor R-wave progression in anterior precordial leads. Normal ST segments and T waves. No acute ischemic changes  ____________________________________________     RADIOLOGY  Ct Head Wo Contrast  Result Date: 06/29/2017 CLINICAL DATA:  Altered mental status EXAM: CT HEAD WITHOUT CONTRAST TECHNIQUE: Contiguous axial images were obtained from the base of the skull through the vertex without intravenous contrast. COMPARISON:  Head CT fifth 03/22/2017 FINDINGS: Brain: No mass lesion, intraparenchymal hemorrhage or extra-axial collection. No evidence of acute cortical infarct. 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 Vascular: No hyperdense vessel or unexpected calcification. Skull: Normal visualized skull base, calvarium and extracranial soft tissues. Sinuses/Orbits: No sinus fluid levels or advanced mucosal thickening. No mastoid effusion. Normal orbits. 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 Electronically Signed   By: Ulyses Jarred M.D.   On: 06/29/2017 20:19   Dg Chest Port 1 View  Result Date: 06/29/2017 CLINICAL DATA:  Altered mental status and shortness breath.  Fever. EXAM: PORTABLE CHEST 1 VIEW COMPARISON:  Chest radiograph 03/24/2017 FINDINGS: Left basilar opacities are similar the prior study. The aeration of the right lung base is improved. Mild cardiomegaly is unchanged. No pleural effusion or pneumothorax. IMPRESSION: 1. Persistent left basilar opacities compared to 03/24/2017. This may be secondary to recurrent pneumonia or a new aspiration event. Treated pneumonia would be expected to have radiographically resolved in the time since the prior radiograph. 2. Improved aeration of the right lung base. Electronically Signed   By: Ulyses Jarred M.D.   On: 06/29/2017 20:04    ____________________________________________   PROCEDURES Procedures CRITICAL CARE Performed by: Joni Fears, Flavius Repsher   Total critical care time: 35 minutes  Critical care time was exclusive of separately billable procedures and treating other patients.  Critical care was necessary to treat or prevent imminent or life-threatening  deterioration.  Critical care was time spent personally by me on the following activities: development of treatment plan with patient and/or surrogate as well as nursing, discussions with consultants, evaluation of patient's response to treatment, examination of patient, obtaining history from patient or surrogate, ordering and performing treatments and interventions, ordering and review of laboratory studies, ordering and review of radiographic studies, pulse oximetry and re-evaluation of patient's condition.  ____________________________________________   INITIAL IMPRESSION / ASSESSMENT AND PLAN / ED COURSE  Pertinent labs & imaging results that were available during my care of the patient were reviewed by me and considered in my medical decision making (see chart for details).  Patient presents with fever tachycardia tachypnea and altered mental status. suspect sepsis  secondary to pneumonia, patient will definitely require hospital admission. Start IV fluid boluses, antibiotic coverage with vancomycin and Zosyn, code sepsis. Urinalysis, chest x-ray, CT head, labs possible urinary tract infection, no evidence of soft tissue infection. Doubt meningitis encephalitis or trauma. Clinical Course as of Jun 29 2225  Wed Jun 29, 2017  2146 Just notified ED secretary to call code sepsis on pt. My fault they did not notify carelink earlier.  Many critical patients today.    [PS]    Clinical Course User Index [PS] Carrie Mew, MD     ----------------------------------------- 10:25 PM on 06/29/2017 -----------------------------------------  Chest x-ray consistent with a worsening pneumonia. White blood cell count severely elevated. Lactate elevated. Plan to admit for treatment of sepsis.doubt meningitis encephalitis or stroke. No evidence of intracranial hemorrhage. No evidence of soft tissue infection.  ____________________________________________   FINAL CLINICAL IMPRESSION(S) / ED  DIAGNOSES  Final diagnoses:  Altered mental status, unspecified altered mental status type  HCAP (healthcare-associated pneumonia)  Sepsis, due to unspecified organism [facility])      New Prescriptions   No medications on file     Portions of this note were generated with dragon dictation software. Dictation errors may occur despite best attempts at proofreading.    Carrie Mew, MD 06/29/17 2233

## 2017-06-29 NOTE — ED Notes (Signed)
Patient transported to CT 

## 2017-06-29 NOTE — H&P (Signed)
[facility] [place] NAME: Thomas Townsend    MR#:  [id number]  DATE OF BIRTH:  10-Jul-1931  DATE OF ADMISSION:  06/29/2017  PRIMARY CARE PHYSICIAN: Placido Sou, MD   REQUESTING/REFERRING PHYSICIAN: Joni Fears, MD  CHIEF COMPLAINT:   Chief Complaint  Patient presents with  . Altered Mental Status  . Shortness of Breath    HISTORY OF PRESENT ILLNESS:  Thomas Townsend  is a 83 y.o. male who presents with Confusion, fever, malaise. Patient was recently admitted here and treated for pneumonia. His daughter states that a few days ago he vomited, and typically has an aspiration event when this happens. In the ED today he meets sepsis criteria, and his x-rays consistent with HCAP or aspiration pneumonia. Pulse were called for admission  PAST MEDICAL HISTORY:   Past Medical History:  Diagnosis Date  . Arthritis   . Atrial fibrillation ([place])   . CHF (congestive heart failure) ([place])   . COPD (chronic obstructive pulmonary disease) ([place])   . Dysrhythmia   . GERD (gastroesophageal reflux disease)   . Hyperlipemia   . Hypertension   . Leukocytosis 01/14/2016  . Prostate cancer ([place])     PAST SURGICAL HISTORY:   Past Surgical History:  Procedure Laterality Date  . CHOLECYSTECTOMY    . COLONOSCOPY WITH PROPOFOL N/A 04/04/2015   Procedure: COLONOSCOPY WITH PROPOFOL;  Surgeon: Manya Silvas, MD;  Location: [facility] ENDOSCOPY;  Service: Endoscopy;  Laterality: N/A;  . ESOPHAGOGASTRODUODENOSCOPY N/A 04/04/2015   Procedure: ESOPHAGOGASTRODUODENOSCOPY (EGD);  Surgeon: Manya Silvas, MD;  Location: [facility] ENDOSCOPY;  Service: Endoscopy;  Laterality: N/A;    SOCIAL HISTORY:   Social History  Substance Use Topics  . Smoking status: Former Smoker    Years: 20.00    Types: Cigarettes  . Smokeless tobacco: Never Used  . Alcohol use 0.0 oz/week     Comment: Wine every once in a while    FAMILY HISTORY:   Family History  Problem Relation Age  of Onset  . Hypertension Other     DRUG ALLERGIES:  No Known Allergies  MEDICATIONS AT HOME:   Prior to Admission medications   Medication Sig Start Date End Date Taking? Authorizing Provider  BREO ELLIPTA 100-25 MCG/INH AEPB Inhale 1 puff into the lungs every morning. 01/28/15  Yes [provider]  digoxin (LANOXIN) 0.125 MG tablet Take 0.125 mg by mouth every morning.  02/22/14  Yes [provider]  folic acid (FOLVITE) 1 MG tablet Take 1 mg by mouth daily.   Yes [provider]  furosemide (LASIX) 20 MG tablet Take 20 mg by mouth every other day. In the morning   Yes [provider]  ipratropium-albuterol (DUONEB) 0.5-2.5 (3) MG/3ML SOLN Take 3 mLs by nebulization every 4 (four) hours as needed.   Yes [provider]  methotrexate (RHEUMATREX) 2.5 MG tablet TAKE 4 TABLETS (15 MG TOTAL) BY MOUTH EVERY 7 (SEVEN) DAYS. 12/24/15  Yes [provider]  montelukast (SINGULAIR) 10 MG tablet Take 10 mg by mouth daily.    Yes [provider]  Multiple Vitamins-Minerals (PRESERVISION AREDS PO) Take 1 tablet by mouth every morning.    Yes [provider]  warfarin (COUMADIN) 4 MG tablet Take 4 mg by mouth daily.   Yes [provider]  albuterol (PROVENTIL HFA;VENTOLIN HFA) 108 (90 Base) MCG/ACT inhaler Inhale 2 puffs into the lungs every 6 (six) hours as needed for wheezing or shortness of breath.  Patient not taking: Reported on 05/18/2017 10/07/16   Hillary Bow, MD  Besifloxacin HCl (BESIVANCE) 0.6 % SUSP Apply to eye.    [provider]  ferrous sulfate 325 (65 FE) MG tablet Take 325 mg by mouth every morning. Reported on 02/25/2016    [provider]    REVIEW OF SYSTEMS:  Review of Systems  Constitutional: Positive for malaise/fatigue. Negative for chills, fever and weight loss.  HENT: Negative for ear pain, hearing loss and tinnitus.   Eyes: Negative for blurred vision, double vision, pain and  redness.  Respiratory: Positive for cough and shortness of breath. Negative for hemoptysis.   Cardiovascular: Negative for chest pain, palpitations, orthopnea and leg swelling.  Gastrointestinal: Negative for abdominal pain, constipation, diarrhea, nausea and vomiting.  Genitourinary: Negative for dysuria, frequency and hematuria.  Musculoskeletal: Negative for back pain, joint pain and neck pain.  Skin:       No acne, rash, or lesions  Neurological: Negative for dizziness, tremors, focal weakness and weakness.  Endo/Heme/Allergies: Negative for polydipsia. Does not bruise/bleed easily.  Psychiatric/Behavioral: Negative for depression. The patient is not nervous/anxious and does not have insomnia.      VITAL SIGNS:   Vitals:   06/29/17 2100 06/29/17 2130 06/29/17 2200 06/29/17 2230  BP: 126/64 (!) 110/55 (!) 92/58 (!) 100/54  Pulse: 92 (!) 114 (!) 105 (!) 104  Resp: (!) 27 (!) 30 (!) 26 (!) 21  Temp:      TempSrc:      SpO2: 99% 96% 96% 99%  Weight:       Wt Readings from Last 3 Encounters:  06/29/17 71.2 kg (157 lb)  03/25/17 60.5 kg (133 lb 6.4 oz)  02/16/17 63 kg (139 lb)    PHYSICAL EXAMINATION:  Physical Exam  Vitals reviewed. Constitutional: He is oriented to person, place, and time. He appears well-developed and well-nourished. No distress.  HENT:  Head: Normocephalic and atraumatic.  Mouth/Throat: Oropharynx is clear and moist.  Eyes: Pupils are equal, round, and reactive to light. Conjunctivae and EOM are normal. No scleral icterus.  Neck: Normal range of motion. Neck supple. No JVD present. No thyromegaly present.  Cardiovascular: Normal rate, regular rhythm and intact distal pulses.  Exam reveals no gallop and no friction rub.   No murmur heard. Respiratory: Effort normal. No respiratory distress. He has no wheezes. He has rales.  GI: Soft. Bowel sounds are normal. He exhibits no distension. There is no tenderness.  Musculoskeletal: Normal range of motion. He  exhibits no edema.  No arthritis, no gout  Lymphadenopathy:    He has no cervical adenopathy.  Neurological: He is alert and oriented to person, place, and time. No cranial nerve deficit.  No dysarthria, no aphasia  Skin: Skin is warm and dry. No rash noted. No erythema.  Psychiatric: He has a normal mood and affect. His behavior is normal. Judgment and thought content normal.    LABORATORY PANEL:   CBC  Recent Labs Lab 06/29/17 1905  WBC 36.8*  HGB 10.4*  HCT 31.2*  PLT 308   ------------------------------------------------------------------------------------------------------------------  Chemistries   Recent Labs Lab 06/29/17 1905  NA 139  K 3.8  CL 103  CO2 25  GLUCOSE 99  BUN 16  CREATININE 1.01  CALCIUM 9.1  AST 24  ALT 11*  ALKPHOS 92  BILITOT 0.5   ------------------------------------------------------------------------------------------------------------------  Cardiac Enzymes  Recent Labs Lab 06/29/17 1905  TROPONINI <0.03   ------------------------------------------------------------------------------------------------------------------  RADIOLOGY:  Ct Head Wo Contrast  Result Date: 06/29/2017 CLINICAL DATA:  Altered mental status EXAM: CT HEAD WITHOUT CONTRAST TECHNIQUE: Contiguous axial images were obtained from the base of the skull through the vertex without intravenous contrast. COMPARISON:  Head CT fifth 03/22/2017 FINDINGS: Brain: No mass lesion, intraparenchymal hemorrhage or extra-axial collection. No evidence of acute cortical infarct. 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 Vascular: No hyperdense vessel or unexpected calcification. Skull: Normal visualized skull base, calvarium and extracranial soft tissues. Sinuses/Orbits: No sinus fluid levels or advanced mucosal thickening. No mastoid effusion. Normal orbits. 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 Electronically Signed   By: Ulyses Jarred M.D.    On: 06/29/2017 20:19   Dg Chest Port 1 View  Result Date: 06/29/2017 CLINICAL DATA:  Altered mental status and shortness breath.  Fever. EXAM: PORTABLE CHEST 1 VIEW COMPARISON:  Chest radiograph 03/24/2017 FINDINGS: Left basilar opacities are similar the prior study. The aeration of the right lung base is improved. Mild cardiomegaly is unchanged. No pleural effusion or pneumothorax. IMPRESSION: 1. Persistent left basilar opacities compared to 03/24/2017. This may be secondary to recurrent pneumonia or a new aspiration event. Treated pneumonia would be expected to have radiographically resolved in the time since the prior radiograph. 2. Improved aeration of the right lung base. Electronically Signed   By: Ulyses Jarred M.D.   On: 06/29/2017 20:04    EKG:   Orders placed or performed during the hospital encounter of 06/29/17  . EKG 12-Lead  . EKG 12-Lead  . EKG 12-Lead  . EKG 12-Lead    IMPRESSION AND PLAN:  Principal Problem:   Sepsis ([place]) - broad IV antibiotics, lactic acid elevated, we will trend and treated with IV fluids until within normal limits, blood pressure was borderline low to low. Cultures sent from the ED Active Problems:   HCAP (healthcare-associated pneumonia) - antibiotics as above   A-fib ([place]) - continue home meds including anticoagulation   Chronic diastolic CHF (congestive heart failure) ([place]) - continue home medications   COPD (chronic obstructive pulmonary disease) (H[facility]) - home dose inhalers   Acid reflux - home dose PPI  All the records are reviewed and case discussed with ED provider. Management plans discussed with the patient and/or family.  DVT PROPHYLAXIS: Systemic anticoagulation  GI PROPHYLAXIS: PPI  ADMISSION STATUS: Inpatient  CODE STATUS: DNR Code Status History    Date Active Date Inactive Code Status Order ID Comments User Context   03/22/2017 10:06 PM 03/25/2017  2:41 PM DNR [id number]  Ivor Costa, MD ED   10/05/2016 10:48 AM 10/07/2016  3:34 PM  Full Code [id number]  Hillary Bow, MD ED   06/21/2016  2:22 AM 06/26/2016  7:39 PM Partial Code [id number]  Lance Coon, MD Inpatient   02/26/2016  2:34 PM 02/27/2016  3:03 PM Partial Code [id number]  Vaughan Basta, MD Inpatient   08/06/2015  2:59 AM 08/11/2015  3:21 PM Full Code [id number]  Hower, Aaron Mose, MD ED    Questions for Most Recent Historical Code Status (Order [id number])    Question Answer Comment   In the event of cardiac or respiratory ARREST Do not call a "code blue"    In the event of cardiac or respiratory ARREST Do not perform Intubation, CPR, defibrillation or ACLS    In the event of cardiac or respiratory ARREST Use medication by any route, position, wound care, and other measures to relive pain and suffering. May use oxygen, suction and manual treatment of airway obstruction as needed for comfort.  TOTAL TIME TAKING CARE OF THIS PATIENT: 45 minutes.   Jannifer Franklin, Danie Diehl [place] 06/29/2017, 11:32 PM  [facility]  Office  [phone]  CC: Primary care physician; Placido Sou, MD  Note:  This document was prepared using Dragon voice recognition software and may include unintentional dictation errors.

## 2017-06-30 ENCOUNTER — Inpatient Hospital Stay: Payer: Medicare Other

## 2017-06-30 DIAGNOSIS — R4182 Altered mental status, unspecified: Secondary | ICD-10-CM

## 2017-06-30 DIAGNOSIS — I5032 Chronic diastolic (congestive) heart failure: Secondary | ICD-10-CM

## 2017-06-30 DIAGNOSIS — J189 Pneumonia, unspecified organism: Secondary | ICD-10-CM

## 2017-06-30 DIAGNOSIS — Z515 Encounter for palliative care: Secondary | ICD-10-CM

## 2017-06-30 LAB — RESPIRATORY PANEL BY PCR
Adenovirus: NOT DETECTED
BORDETELLA PERTUSSIS-RVPCR: NOT DETECTED
CORONAVIRUS 229E-RVPPCR: NOT DETECTED
Chlamydophila pneumoniae: NOT DETECTED
Coronavirus HKU1: NOT DETECTED
Coronavirus NL63: NOT DETECTED
Coronavirus OC43: NOT DETECTED
INFLUENZA A-RVPPCR: NOT DETECTED
INFLUENZA B-RVPPCR: NOT DETECTED
METAPNEUMOVIRUS-RVPPCR: NOT DETECTED
MYCOPLASMA PNEUMONIAE-RVPPCR: NOT DETECTED
PARAINFLUENZA VIRUS 4-RVPPCR: NOT DETECTED
Parainfluenza Virus 1: NOT DETECTED
Parainfluenza Virus 2: NOT DETECTED
Parainfluenza Virus 3: NOT DETECTED
RESPIRATORY SYNCYTIAL VIRUS-RVPPCR: NOT DETECTED
Rhinovirus / Enterovirus: NOT DETECTED

## 2017-06-30 LAB — PROTIME-INR
INR: 3.6
Prothrombin Time: 35.6 seconds — ABNORMAL HIGH (ref 11.4–15.2)

## 2017-06-30 LAB — CBC
HEMATOCRIT: 24.9 % — AB (ref 40.0–52.0)
HEMOGLOBIN: 8.2 g/dL — AB (ref 13.0–18.0)
MCH: 31.6 pg (ref 26.0–34.0)
MCHC: 32.9 g/dL (ref 32.0–36.0)
MCV: 96 fL (ref 80.0–100.0)
Platelets: 225 10*3/uL (ref 150–440)
RBC: 2.6 MIL/uL — AB (ref 4.40–5.90)
RDW: 17.4 % — ABNORMAL HIGH (ref 11.5–14.5)
WBC: 36.6 10*3/uL — ABNORMAL HIGH (ref 3.8–10.6)

## 2017-06-30 LAB — URINALYSIS, COMPLETE (UACMP) WITH MICROSCOPIC
BILIRUBIN URINE: NEGATIVE
Bacteria, UA: NONE SEEN
GLUCOSE, UA: NEGATIVE mg/dL
Ketones, ur: NEGATIVE mg/dL
Leukocytes, UA: NEGATIVE
NITRITE: NEGATIVE
Protein, ur: NEGATIVE mg/dL
SPECIFIC GRAVITY, URINE: 1.006 (ref 1.005–1.030)
pH: 7 (ref 5.0–8.0)

## 2017-06-30 LAB — BASIC METABOLIC PANEL
ANION GAP: 6 (ref 5–15)
BUN: 14 mg/dL (ref 6–20)
CO2: 25 mmol/L (ref 22–32)
Calcium: 7.7 mg/dL — ABNORMAL LOW (ref 8.9–10.3)
Chloride: 108 mmol/L (ref 101–111)
Creatinine, Ser: 0.86 mg/dL (ref 0.61–1.24)
GFR calc non Af Amer: >60 mL/min (ref >60)
GLUCOSE: 122 mg/dL — AB (ref 65–99)
POTASSIUM: 3.6 mmol/L (ref 3.5–5.1)
Sodium: 139 mmol/L (ref 135–145)

## 2017-06-30 LAB — MRSA PCR SCREENING: MRSA by PCR: NEGATIVE

## 2017-06-30 LAB — PROCALCITONIN: PROCALCITONIN: 0.32 ng/mL

## 2017-06-30 LAB — LACTIC ACID, PLASMA: Lactic Acid, Venous: 1.4 mmol/L (ref 0.5–1.9)

## 2017-06-30 MED ORDER — FOLIC ACID 1 MG PO TABS
1.0000 mg | ORAL_TABLET | Freq: Every day | ORAL | Status: DC
  Administered 2017-06-30 – 2017-07-03 (×4): 1 mg via ORAL
  Filled 2017-06-30 (×4): qty 1

## 2017-06-30 MED ORDER — IPRATROPIUM-ALBUTEROL 0.5-2.5 (3) MG/3ML IN SOLN: 3.0000 mL | RESPIRATORY_TRACT | Status: DC | PRN

## 2017-06-30 MED ORDER — ONDANSETRON HCL 4 MG/2ML IJ SOLN
4.0000 mg | Freq: Four times a day (QID) | INTRAMUSCULAR | Status: DC | PRN
Start: 2017-06-30 — End: 2017-07-03

## 2017-06-30 MED ORDER — MONTELUKAST SODIUM 10 MG PO TABS
10.0000 mg | ORAL_TABLET | Freq: Every day | ORAL | Status: DC
  Administered 2017-06-30 – 2017-07-03 (×4): 10 mg via ORAL
  Filled 2017-06-30 (×4): qty 1

## 2017-06-30 MED ORDER — FUROSEMIDE 20 MG PO TABS
20.0000 mg | ORAL_TABLET | ORAL | Status: DC
  Administered 2017-07-02: 10:00:00 20 mg via ORAL
  Filled 2017-06-30 (×3): qty 1

## 2017-06-30 MED ORDER — SODIUM CHLORIDE 0.9 % IV SOLN
INTRAVENOUS | Status: DC
  Administered 2017-06-30: 01:00:00 via INTRAVENOUS

## 2017-06-30 MED ORDER — FLUTICASONE FUROATE-VILANTEROL 100-25 MCG/INH IN AEPB
1.0000 | INHALATION_SPRAY | RESPIRATORY_TRACT | Status: DC
  Administered 2017-06-30 – 2017-07-03 (×4): 1 via RESPIRATORY_TRACT
  Filled 2017-06-30: qty 28

## 2017-06-30 MED ORDER — WARFARIN SODIUM 4 MG PO TABS: 4.0000 mg | ORAL_TABLET | Freq: Every day | ORAL | Status: DC

## 2017-06-30 MED ORDER — ONDANSETRON HCL 4 MG PO TABS: 4.0000 mg | ORAL_TABLET | Freq: Four times a day (QID) | ORAL | Status: DC | PRN

## 2017-06-30 MED ORDER — DIGOXIN 125 MCG PO TABS
0.1250 mg | ORAL_TABLET | ORAL | Status: DC
  Administered 2017-06-30 – 2017-07-03 (×4): 0.125 mg via ORAL
  Filled 2017-06-30 (×5): qty 1

## 2017-06-30 MED ORDER — ACETAMINOPHEN 325 MG PO TABS: 650.0000 mg | ORAL_TABLET | Freq: Four times a day (QID) | ORAL | Status: DC | PRN

## 2017-06-30 MED ORDER — ACETAMINOPHEN 650 MG RE SUPP: 650.0000 mg | Freq: Four times a day (QID) | RECTAL | Status: DC | PRN

## 2017-06-30 MED ORDER — ENOXAPARIN SODIUM 40 MG/0.4ML ~~LOC~~ SOLN: 40.0000 mg | SUBCUTANEOUS | Status: DC

## 2017-06-30 NOTE — Consult Note (Signed)
Consultation Note Date: 06/30/2017   Patient Name: Thomas Townsend  DOB: 1931-04-15  MRN: [id number]  Age / Sex: 83 y.o., male  PCP: Placido Sou, MD Referring Physician: Max Sane, MD  Reason for Consultation: Establishing goals of care  HPI/Patient Profile: 83 y.o. male  with past medical history of diastolic CHF, Afib, COPD (on oxygen at night), prostate cancer, rheumatoid arthritis, and a myeloproliferative disorder (Leukemia per dtr) for which he has refused treatment, who was admitted on 06/29/2017 with fever and confusion.  On admission his temperature was 103 and lactic acid was 3.  CXR showed possible HCAP.  He was admitted with sepsis.  Clinical Assessment and Goals of Care:  I have reviewed medical records including EPIC notes, labs and imaging, received report from Dr. Manuella Ghazi, assessed the patient and then met with his daughter [name]) [name] to discuss diagnosis prognosis, GOC, EOL wishes, disposition and options.  [name] works in [employer] Radiology.  I introduced Palliative Medicine as specialized medical care for people living with serious illness. It focuses on providing relief from the symptoms and stress of a serious illness. The goal is to improve quality of life for both the patient and the family.  We discussed a brief life review of the patient. 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  He lives independently at his home in [place].  His son lives with him.  He lost his wife approximately 7 years ago.  He is independent with ADLs.  He has significantly reduced his driving as his eye sight has become worse due to macular degeneration.  Per his daughter he is not good about eating meals or drinking fluids if he is alone.  He has lost 15 pounds in the last year and sometimes just doesn't have an appetite.  I asked [name] why he is in the hospital.  He mentioned the pneumonia,  but then said "I've gotten very cold and then I go out".  I asked clarifying questions - He has had 4 episodes over the past year where he will become very cold and then he doesn't remember what happens.  This is concerning to him.  We talked about EOL wishes.  He has already completed an Advanced Directive - his daughter is his HC power of attorney, and he would not want resuscitation if he was near end of life.  However he is still full scope treatment and wants to treat what is treatable.  [name] tells me that he is very active.  He did not want to come to the hospital.  He is unhappy when he is "down in the bed".  He is happiest when he is up and moving - able to work.  Mr. Baer told me his wife died with the benefit of hospice services.  He seems to feel good about Hospice services.  I attempted to elicit values and goals of care important to the patient.    Hospice and Palliative Care services outpatient were  explained and offered.  [name] is accepting of Palliative outpatient follow up.  Questions and concerns were addressed. The family was encouraged to call with questions or concerns.    Primary Decision Maker:  PATIENT .  His daughter [name] is his HCPOA   SUMMARY OF RECOMMENDATIONS    1.  Palliative to follow outpatient. 2.  Discussed considering MR brain given 4 episodes of "going out".  In reviewing his CT scans - it appears he has had an occipital lobe infarct sometime over the past year. 3.  Discussed the need (coming soon) for more supervision/help at home for [name] in the coming months.  [name] would like to have him move in with her. 4.  Planning to obtain new PCP next week - [place] [facility] 5.  Will request orthostatic vitals  6.  Will request a swallow eval.  Code Status/Advance Care Planning:  DNR  - however treat the treatable.  He is full scope treatment.   Psycho-social/Spiritual:   Desire for further Chaplaincy support: yes ordered  Prognosis:  unable to determine at this point.  However I would not be surprised if he passed away in the next 1 year.    Discharge Planning: Home with Palliative follow up.      Primary Diagnoses: Present on Admission: . Acid reflux . A-fib ([place]) . Chronic diastolic CHF (congestive heart failure) ([place]) . COPD (chronic obstructive pulmonary disease) ([place]) . Sepsis ([place]) . HCAP (healthcare-associated pneumonia)   I have reviewed the medical record, interviewed the patient and family, and examined the patient. The following aspects are pertinent.  Past Medical History:  Diagnosis Date  . Arthritis   . Atrial fibrillation ([place])   . CHF (congestive heart failure) ([place])   . COPD (chronic obstructive pulmonary disease) ([place])   . Dysrhythmia   . GERD (gastroesophageal reflux disease)   . Hyperlipemia   . Hypertension   . Leukocytosis 01/14/2016  . Prostate cancer [facility])    Social History   Social History  . Marital status: Widowed    Spouse name: N/A  . Number of children: N/A  . Years of education: N/A   Social History Main Topics  . Smoking status: Former Smoker    Years: 20.00    Types: Cigarettes  . Smokeless tobacco: Never Used  . Alcohol use 0.0 oz/week     Comment: Wine every once in a while  . Drug use: No  . Sexual activity: No   Other Topics Concern  . None   Social History Narrative   ** Merged History Encounter **       Family History  Problem Relation Age of Onset  . Hypertension Other    Scheduled Meds: . digoxin  0.125 mg Oral BH-q7a  . fluticasone furoate-vilanterol  1 puff Inhalation BH-q7a  . folic acid  1 mg Oral Daily  . furosemide  20 mg Oral QODAY  . montelukast  10 mg Oral Daily   Continuous Infusions: . piperacillin-tazobactam (ZOSYN)  IV Stopped (06/30/17 1232)  . vancomycin Stopped (06/30/17 0539)   PRN Meds:.acetaminophen **OR** acetaminophen, ipratropium-albuterol, ondansetron **OR** ondansetron (ZOFRAN) IV No Known Allergies Review  of Systems - as noted above:  15 lb wt loss, lack of appetite, episodes of cold and then "going out".  Denies CP, AP, changes in bowel habits, dysuria  Physical Exam  Very pleasant elderly male, A&O x 3 cooperative, NAD.  Appears frail CV rrr resp no distress on nasal cannula Abdomen soft,  ND Ext - no edema, symetric 5/5 strength in each.  Vital Signs: BP (!) 91/56 (BP Location: Left Arm)   Pulse 85   Temp 97.8 F (36.6 C) (Oral)   Resp (!) 21   Ht '5\' 7"'  (1.702 m)   Wt 63.7 kg (140 lb 7 oz)   SpO2 98%   BMI 22.00 kg/m  Pain Assessment: No/denies pain   Pain Score: 0-No pain   SpO2: SpO2: 98 % O2 Device:SpO2: 98 % O2 Flow Rate: .O2 Flow Rate (L/min): 4 L/min  IO: Intake/output summary:  Intake/Output Summary (Last 24 hours) at 06/30/17 1433 Last data filed at 06/30/17 1231  Gross per 24 hour  Intake           710.42 ml  Output              200 ml  Net           510.42 ml    LBM: Last BM Date: 06/30/17 Baseline Weight: Weight: 71.2 kg (157 lb) Most recent weight: Weight: 63.7 kg (140 lb 7 oz)     Palliative Assessment/Data: 70%     Time In: 2:00 Time Out: 3:30 Time Total: 90 min. Greater than 50%  of this time was spent counseling and coordinating care related to the above assessment and plan.  Signed by: Florentina Jenny, PA-C Palliative Medicine Pager: [phone]  Please contact Palliative Medicine Team phone at [phone] for questions and concerns.  For individual provider: See Shea Evans

## 2017-06-30 NOTE — Progress Notes (Signed)
[facility] [place] NAME: Thomas Townsend    MR#:  [id number]  DATE OF BIRTH:  April 20, 1931  SUBJECTIVE:  CHIEF COMPLAINT:   Chief Complaint  Patient presents with  . Altered Mental Status  . Shortness of Breath  Hard of hearing, weak, no other complaints, doesn't know why he is here REVIEW OF SYSTEMS:  Review of Systems  Unable to perform ROS: Mental status change  HENT: Positive for hearing loss.     DRUG ALLERGIES:  No Known Allergies VITALS:  Blood pressure 99/72, pulse (!) 104, temperature 98.1 F (36.7 C), temperature source Oral, resp. rate 18, height 5\' 7"  (1.702 m), weight 63.7 kg (140 lb 7 oz), SpO2 99 %. PHYSICAL EXAMINATION:  Physical Exam  Constitutional: He is well-developed, well-nourished, and in no distress.  HENT:  Head: Normocephalic and atraumatic.  Eyes: Pupils are equal, round, and reactive to light. Conjunctivae and EOM are normal.  Neck: Normal range of motion. Neck supple. No tracheal deviation present. No thyromegaly present.  Cardiovascular: Normal rate, regular rhythm and normal heart sounds.   Pulmonary/Chest: He is in respiratory distress. He has decreased breath sounds in the right lower field and the left lower field. He has no wheezes. He has rhonchi. He exhibits no tenderness.  Abdominal: Soft. Bowel sounds are normal. He exhibits no distension. There is no tenderness.  Musculoskeletal: Normal range of motion.  Neurological: He is alert. He is disoriented. No cranial nerve deficit.  Skin: Skin is warm and dry. No rash noted.  Psychiatric: Mood and affect normal.   LABORATORY PANEL:  Male CBC  Recent Labs Lab 06/30/17 0521  WBC 36.6*  HGB 8.2*  HCT 24.9*  PLT 225   ------------------------------------------------------------------------------------------------------------------ Chemistries   Recent Labs Lab 06/29/17 1905 06/30/17 0521  NA 139 139  K 3.8 3.6  CL 103 108  CO2 25 25    GLUCOSE 99 122*  BUN 16 14  CREATININE 1.01 0.86  CALCIUM 9.1 7.7*  AST 24  --   ALT 11*  --   ALKPHOS 92  --   BILITOT 0.5  --    RADIOLOGY:  Ct Head Wo Contrast  Result Date: 06/29/2017 CLINICAL DATA:  Altered mental status EXAM: CT HEAD WITHOUT CONTRAST TECHNIQUE: Contiguous axial images were obtained from the base of the skull through the vertex without intravenous contrast. COMPARISON:  Head CT fifth 03/22/2017 FINDINGS: Brain: No mass lesion, intraparenchymal hemorrhage or extra-axial collection. No evidence of acute cortical infarct. 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 Vascular: No hyperdense vessel or unexpected calcification. Skull: Normal visualized skull base, calvarium and extracranial soft tissues. Sinuses/Orbits: No sinus fluid levels or advanced mucosal thickening. No mastoid effusion. Normal orbits. 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 Electronically Signed   By: Ulyses Jarred M.D.   On: 06/29/2017 20:19   Dg Chest Port 1 View  Result Date: 06/29/2017 CLINICAL DATA:  Altered mental status and shortness breath.  Fever. EXAM: PORTABLE CHEST 1 VIEW COMPARISON:  Chest radiograph 03/24/2017 FINDINGS: Left basilar opacities are similar the prior study. The aeration of the right lung base is improved. Mild cardiomegaly is unchanged. No pleural effusion or pneumothorax. IMPRESSION: 1. Persistent left basilar opacities compared to 03/24/2017. This may be secondary to recurrent pneumonia or a new aspiration event. Treated pneumonia would be expected to have radiographically resolved in the time since the prior radiograph. 2. Improved aeration of the right lung base. Electronically Signed  By: Ulyses Jarred M.D.   On: 06/29/2017 20:04   ASSESSMENT AND PLAN:  83 y.o. male who admitted with Confusion, fever, malaise. Patient was recently admitted here and treated for pneumonia.   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 Today white count is 36; hemoglobin 8.2, platelets normal. Patient is asymptomatic  * Anemia of chronic dz: - hemoglobin 10.7->8.2 likely secondary to above primary bone marrow process. No iron deficiency anemia.   * Prostate cancer early stage [follows up with urology; Dr.Tennenbaum; [place]]; patient interested in follow-up at our cancer center    He is critically sick and high risk for cardio-resp failure, multiorgan failure and death.  All the records are reviewed and case discussed with Care Management/Social Worker. Management plans discussed with the patient, family (updated son over phone) and they are in agreement.  CODE STATUS: DNR  TOTAL TIME (critical care) TAKING CARE OF THIS PATIENT: 35 minutes.   More than 50% of the time was spent in counseling/coordination of care: YES  POSSIBLE D/C IN 2-3 DAYS, DEPENDING ON CLINICAL CONDITION.   Max Sane M.D on 06/30/2017 at 8:55 AM  Between 7am to 6pm - Pager - [phone]  After 6pm go to [email or web address] - Proofreader  [facility]  Office  [phone]  CC: Primary care physician; Placido Sou, MD  Note: This dictation was prepared with Dragon dictation along with smaller phrase technology. Any transcriptional errors that result from this process are unintentional.

## 2017-06-30 NOTE — Progress Notes (Signed)
Medications administered by student RN [id number] with supervision of Clinical Instructor Marionna Gonia MSN, RN-BC or patient's assigned RN.   

## 2017-06-30 NOTE — Plan of Care (Signed)
Problem: Activity: Goal: Risk for activity intolerance will decrease Outcome: Progressing Pt walked to the bathroom.

## 2017-06-30 NOTE — Plan of Care (Signed)
Problem: Safety: Goal: Ability to remain free from injury will improve Outcome: Progressing Pt remained free of fall today.

## 2017-06-30 NOTE — Consult Note (Signed)
[facility] Infectious Disease     Reason for Consult fever sepsis     Referring Physician: Carlynn Spry Date of Admission:  06/29/2017   Principal Problem:   Sepsis ([place]) Active Problems:   A-fib (HCC)   Acid reflux   Chronic diastolic CHF (congestive heart failure) (HCC)   COPD (chronic obstructive pulmonary disease) ([place])   HCAP (healthcare-associated pneumonia)   HPI: Thomas Townsend is a 83 y.o. male admitted with fevers and chills. 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 He denies any HA< ST, cough, abd pain, nvd or dysuria. No skin or soft tissue infections.  He Has persistetnly elevated wbc and follows with oncology but has been relatively well.  On admit wbc 36 (baseline about 20) and temp 103. CXR showed chronic changes in L base. UA Neg.   Past Medical History:  Diagnosis Date  . Arthritis   . Atrial fibrillation ([place])   . CHF (congestive heart failure) ([place])   . COPD (chronic obstructive pulmonary disease) ([place])   . Dysrhythmia   . GERD (gastroesophageal reflux disease)   . Hyperlipemia   . Hypertension   . Leukocytosis 01/14/2016  . Prostate cancer [facility])    Past Surgical History:  Procedure Laterality Date  . CHOLECYSTECTOMY    . COLONOSCOPY WITH PROPOFOL N/A 04/04/2015   Procedure: COLONOSCOPY WITH PROPOFOL;  Surgeon: Manya Silvas, MD;  Location: [facility] ENDOSCOPY;  Service: Endoscopy;  Laterality: N/A;  . ESOPHAGOGASTRODUODENOSCOPY N/A 04/04/2015   Procedure: ESOPHAGOGASTRODUODENOSCOPY (EGD);  Surgeon: Manya Silvas, MD;  Location: [facility] ENDOSCOPY;  Service: Endoscopy;  Laterality: N/A;   Social History  Substance Use Topics  . Smoking status: Former Smoker    Years: 20.00    Types: Cigarettes  . Smokeless tobacco: Never Used  . Alcohol use 0.0 oz/week     Comment: Wine every once in a while   Family History  Problem Relation Age of Onset  . Hypertension Other     Allergies: No  Known Allergies  Current antibiotics: Antibiotics Given (last 72 hours)    Date/Time Action Medication Dose Rate   06/29/17 1923 New Bag/Given   vancomycin (VANCOCIN) IVPB 1000 mg/200 mL premix 1,000 mg 200 mL/hr   06/29/17 1924 New Bag/Given   piperacillin-tazobactam (ZOSYN) IVPB 3.375 g 3.375 g 100 mL/hr   06/29/17 2232 New Bag/Given   levofloxacin (LEVAQUIN) IVPB 750 mg 750 mg 100 mL/hr   06/30/17 0123 New Bag/Given   piperacillin-tazobactam (ZOSYN) IVPB 3.375 g 3.375 g 12.5 mL/hr   06/30/17 0439 New Bag/Given   vancomycin (VANCOCIN) IVPB 1000 mg/200 mL premix 1,000 mg 200 mL/hr   06/30/17 0920 New Bag/Given   piperacillin-tazobactam (ZOSYN) IVPB 3.375 g 3.375 g 12.5 mL/hr      MEDICATIONS: . digoxin  0.125 mg Oral BH-q7a  . fluticasone furoate-vilanterol  1 puff Inhalation BH-q7a  . folic acid  1 mg Oral Daily  . furosemide  20 mg Oral QODAY  . montelukast  10 mg Oral Daily    Review of Systems - 11 systems reviewed and negative per HPI   OBJECTIVE: Temp:  [97.8 F (36.6 C)-103.7 F (39.8 C)] 98.1 F (36.7 C) (10/04 0804) Pulse Rate:  [60-114] 104 (10/04 0802) Resp:  [16-40] 18 (10/04 0802) BP: (88-158)/(41-78) 99/72 (10/04 0802) SpO2:  [88 %-100 %] 99 % (10/04 0802) Weight:  [63.7 kg (140 lb 7 oz)-71.2 kg (157 lb)]  63.7 kg (140 lb 7 oz) (10/04 0100) Physical Exam  Constitutional: He is oriented to person, place, and time. He appears well-developed and well-nourished. No distress.  HENT:  Mouth/Throat: Oropharynx is clear and moist. No oropharyngeal exudate.  Cardiovascular: Normal rate, regular rhythm and normal heart sounds. Exam reveals no gallop and no friction rub.  No murmur heard.  Pulmonary/Chest: Effort normal and breath sounds normal. No respiratory distress. He has no wheezes.  Abdominal: Soft. Bowel sounds are normal. He exhibits no distension. There is no tenderness.  Lymphadenopathy:  He has no cervical adenopathy.  Neurological: He is alert and  oriented to person, place, and time.  Skin: Skin is warm and dry. No rash noted. No erythema.  Psychiatric: He has a normal mood and affect. His behavior is normal.     LABS: Results for orders placed or performed during the hospital encounter of 06/29/17 (from the past 48 hour(s))  Comprehensive metabolic panel     Status: Abnormal   Collection Time: 06/29/17  7:05 PM  Result Value Ref Range   Sodium 139 135 - 145 mmol/L   Potassium 3.8 3.5 - 5.1 mmol/L   Chloride 103 101 - 111 mmol/L   CO2 25 22 - 32 mmol/L   Glucose, Bld 99 65 - 99 mg/dL   BUN 16 6 - 20 mg/dL   Creatinine, Ser 1.01 0.61 - 1.24 mg/dL   Calcium 9.1 8.9 - 10.3 mg/dL   Total Protein 7.7 6.5 - 8.1 g/dL   Albumin 3.9 3.5 - 5.0 g/dL   AST 24 15 - 41 U/L   ALT 11 (L) 17 - 63 U/L   Alkaline Phosphatase 92 38 - 126 U/L   Total Bilirubin 0.5 0.3 - 1.2 mg/dL   GFR calc non Af Amer >60 >60 mL/min   GFR calc Af Amer >60 >60 mL/min    Comment: (NOTE) The eGFR has been calculated using the CKD EPI equation. This calculation has not been validated in all clinical situations. eGFR's persistently <60 mL/min signify possible Chronic Kidney Disease.    Anion gap 11 5 - 15  CBC with Differential     Status: Abnormal   Collection Time: 06/29/17  7:05 PM  Result Value Ref Range   WBC 36.8 (H) 3.8 - 10.6 K/uL   RBC 3.24 (L) 4.40 - 5.90 MIL/uL   Hemoglobin 10.4 (L) 13.0 - 18.0 g/dL   HCT 31.2 (L) 40.0 - 52.0 %   MCV 96.5 80.0 - 100.0 fL   MCH 32.2 26.0 - 34.0 pg   MCHC 33.4 32.0 - 36.0 g/dL   RDW 17.6 (H) 11.5 - 14.5 %   Platelets 308 150 - 440 K/uL   Neutrophils Relative % 78 %   Lymphocytes Relative 4 %   Monocytes Relative 18 %   Eosinophils Relative 0 %   Basophils Relative 0 %   Neutro Abs 28.7 (H) 1.4 - 6.5 K/uL   Lymphs Abs 1.5 1.0 - 3.6 K/uL   Monocytes Absolute 6.6 (H) 0.2 - 1.0 K/uL   Eosinophils Absolute 0.0 0 - 0.7 K/uL   Basophils Absolute 0.0 0 - 0.1 K/uL   RBC Morphology POLYCHROMASIA PRESENT      Comment: MIXED RBC POPULATION  Lactic acid, plasma     Status: Abnormal   Collection Time: 06/29/17  7:05 PM  Result Value Ref Range   Lactic Acid, Venous 2.7 (HH) 0.5 - 1.9 mmol/L    Comment: CRITICAL RESULT CALLED TO, READ BACK BY  AND VERIFIED WITH KENDALL MOFFITT 06/29/17 1959 KLW   Lipase, blood     Status: None   Collection Time: 06/29/17  7:05 PM  Result Value Ref Range   Lipase 29 11 - 51 U/L  Troponin I     Status: None   Collection Time: 06/29/17  7:05 PM  Result Value Ref Range   Troponin I <0.03 <0.03 ng/mL  Procalcitonin     Status: None   Collection Time: 06/29/17  7:05 PM  Result Value Ref Range   Procalcitonin <0.10 ng/mL    Comment:        Interpretation: PCT (Procalcitonin) <= 0.5 ng/mL: Systemic infection (sepsis) is not likely. Local bacterial infection is possible. (NOTE)         ICU PCT Algorithm               Non ICU PCT Algorithm    ----------------------------     ------------------------------         PCT < 0.25 ng/mL                 PCT < 0.1 ng/mL     Stopping of antibiotics            Stopping of antibiotics       strongly encouraged.               strongly encouraged.    ----------------------------     ------------------------------       PCT level decrease by               PCT < 0.25 ng/mL       >= 80% from peak PCT       OR PCT 0.25 - 0.5 ng/mL          Stopping of antibiotics                                             encouraged.     Stopping of antibiotics           encouraged.    ----------------------------     ------------------------------       PCT level decrease by              PCT >= 0.25 ng/mL       < 80% from peak PCT        AND PCT >= 0.5 ng/mL            Continuin g antibiotics                                              encouraged.       Continuing antibiotics            encouraged.    ----------------------------     ------------------------------     PCT level increase compared          PCT > 0.5 ng/mL         with peak  PCT AND          PCT >= 0.5 ng/mL             Escalation of antibiotics  strongly encouraged.      Escalation of antibiotics        strongly encouraged.   Blood Culture (routine x 2)     Status: None (Preliminary result)   Collection Time: 06/29/17  7:05 PM  Result Value Ref Range   Specimen Description BLOOD BLOOD RIGHT FOREARM    Special Requests      BOTTLES DRAWN AEROBIC AND ANAEROBIC Blood Culture adequate volume   Culture NO GROWTH < 12 HOURS    Report Status PENDING   Blood Culture (routine x 2)     Status: None (Preliminary result)   Collection Time: 06/29/17  7:05 PM  Result Value Ref Range   Specimen Description BLOOD BLOOD LEFT FOREARM    Special Requests      BOTTLES DRAWN AEROBIC AND ANAEROBIC Blood Culture adequate volume   Culture NO GROWTH < 12 HOURS    Report Status PENDING   Protime-INR     Status: Abnormal   Collection Time: 06/29/17  7:38 PM  Result Value Ref Range   Prothrombin Time 29.0 (H) 11.4 - 15.2 seconds   INR 2.77   APTT     Status: None   Collection Time: 06/29/17  7:38 PM  Result Value Ref Range   aPTT 35 24 - 36 seconds  Lactic acid, plasma     Status: Abnormal   Collection Time: 06/29/17 10:37 PM  Result Value Ref Range   Lactic Acid, Venous 3.0 (HH) 0.5 - 1.9 mmol/L    Comment: CRITICAL RESULT CALLED TO, READ BACK BY AND VERIFIED WITH KENDALL MOFITT AT 2318 06/29/17 ALV   Urinalysis, Complete w Microscopic     Status: Abnormal   Collection Time: 06/30/17  4:00 AM  Result Value Ref Range   Color, Urine STRAW (A) YELLOW   APPearance CLEAR (A) CLEAR   Specific Gravity, Urine 1.006 1.005 - 1.030   pH 7.0 5.0 - 8.0   Glucose, UA NEGATIVE NEGATIVE mg/dL   Hgb urine dipstick SMALL (A) NEGATIVE   Bilirubin Urine NEGATIVE NEGATIVE   Ketones, ur NEGATIVE NEGATIVE mg/dL   Protein, ur NEGATIVE NEGATIVE mg/dL   Nitrite NEGATIVE NEGATIVE   Leukocytes, UA NEGATIVE NEGATIVE   RBC / HPF 0-5 0 - 5 RBC/hpf    WBC, UA 0-5 0 - 5 WBC/hpf   Bacteria, UA NONE SEEN NONE SEEN   Squamous Epithelial / LPF 0-5 (A) NONE SEEN  Basic metabolic panel     Status: Abnormal   Collection Time: 06/30/17  5:21 AM  Result Value Ref Range   Sodium 139 135 - 145 mmol/L   Potassium 3.6 3.5 - 5.1 mmol/L   Chloride 108 101 - 111 mmol/L   CO2 25 22 - 32 mmol/L   Glucose, Bld 122 (H) 65 - 99 mg/dL   BUN 14 6 - 20 mg/dL   Creatinine, Ser 0.86 0.61 - 1.24 mg/dL   Calcium 7.7 (L) 8.9 - 10.3 mg/dL   GFR calc non Af Amer >60 >60 mL/min   GFR calc Af Amer >60 >60 mL/min    Comment: (NOTE) The eGFR has been calculated using the CKD EPI equation. This calculation has not been validated in all clinical situations. eGFR's persistently <60 mL/min signify possible Chronic Kidney Disease.    Anion gap 6 5 - 15  CBC     Status: Abnormal   Collection Time: 06/30/17  5:21 AM  Result Value Ref Range   WBC 36.6 (H) 3.8 - 10.6 K/uL   RBC  2.60 (L) 4.40 - 5.90 MIL/uL   Hemoglobin 8.2 (L) 13.0 - 18.0 g/dL    Comment: RESULT REPEATED AND VERIFIED   HCT 24.9 (L) 40.0 - 52.0 %   MCV 96.0 80.0 - 100.0 fL   MCH 31.6 26.0 - 34.0 pg   MCHC 32.9 32.0 - 36.0 g/dL   RDW 17.4 (H) 11.5 - 14.5 %   Platelets 225 150 - 440 K/uL  Protime-INR     Status: Abnormal   Collection Time: 06/30/17  5:21 AM  Result Value Ref Range   Prothrombin Time 35.6 (H) 11.4 - 15.2 seconds   INR 3.60   Lactic acid, plasma     Status: None   Collection Time: 06/30/17  5:21 AM  Result Value Ref Range   Lactic Acid, Venous 1.4 0.5 - 1.9 mmol/L  MRSA PCR Screening     Status: None   Collection Time: 06/30/17 11:04 AM  Result Value Ref Range   MRSA by PCR NEGATIVE NEGATIVE    Comment:        The GeneXpert MRSA Assay (FDA approved for NASAL specimens only), is one component of a comprehensive MRSA colonization surveillance program. It is not intended to diagnose MRSA infection nor to guide or monitor treatment for MRSA infections.    No components  found for: ESR, C REACTIVE PROTEIN MICRO: Recent Results (from the past 720 hour(s))  Blood Culture (routine x 2)     Status: None (Preliminary result)   Collection Time: 06/29/17  7:05 PM  Result Value Ref Range Status   Specimen Description BLOOD BLOOD RIGHT FOREARM  Final   Special Requests   Final    BOTTLES DRAWN AEROBIC AND ANAEROBIC Blood Culture adequate volume   Culture NO GROWTH < 12 HOURS  Final   Report Status PENDING  Incomplete  Blood Culture (routine x 2)     Status: None (Preliminary result)   Collection Time: 06/29/17  7:05 PM  Result Value Ref Range Status   Specimen Description BLOOD BLOOD LEFT FOREARM  Final   Special Requests   Final    BOTTLES DRAWN AEROBIC AND ANAEROBIC Blood Culture adequate volume   Culture NO GROWTH < 12 HOURS  Final   Report Status PENDING  Incomplete  MRSA PCR Screening     Status: None   Collection Time: 06/30/17 11:04 AM  Result Value Ref Range Status   MRSA by PCR NEGATIVE NEGATIVE Final    Comment:        The GeneXpert MRSA Assay (FDA approved for NASAL specimens only), is one component of a comprehensive MRSA colonization surveillance program. It is not intended to diagnose MRSA infection nor to guide or monitor treatment for MRSA infections.     IMAGING: Ct Head Wo Contrast  Result Date: 06/29/2017 CLINICAL DATA:  Altered mental status EXAM: CT HEAD WITHOUT CONTRAST TECHNIQUE: Contiguous axial images were obtained from the base of the skull through the vertex without intravenous contrast. COMPARISON:  Head CT fifth 03/22/2017 FINDINGS: Brain: No mass lesion, intraparenchymal hemorrhage or extra-axial collection. No evidence of acute cortical infarct. 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 Vascular: No hyperdense vessel or unexpected calcification. Skull: Normal visualized skull base, calvarium and extracranial soft tissues. Sinuses/Orbits: No sinus fluid levels or advanced mucosal thickening. No mastoid effusion. Normal orbits.  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 Electronically Signed   By: Ulyses Jarred M.D.   On: 06/29/2017 20:19   Dg Chest [facility] 1 View  Result  Date: 06/29/2017 CLINICAL DATA:  Altered mental status and shortness breath.  Fever. EXAM: PORTABLE CHEST 1 VIEW COMPARISON:  Chest radiograph 03/24/2017 FINDINGS: Left basilar opacities are similar the prior study. The aeration of the right lung base is improved. Mild cardiomegaly is unchanged. No pleural effusion or pneumothorax. IMPRESSION: 1. Persistent left basilar opacities compared to 03/24/2017. This may be secondary to recurrent pneumonia or a new aspiration event. Treated pneumonia would be expected to have radiographically resolved in the time since the prior radiograph. 2. Improved aeration of the right lung base. Electronically Signed   By: Ulyses Jarred M.D.   On: 06/29/2017 20:04    Assessment:   Thomas Townsend is a 83 y.o. male admitted with acute onset fevers, and wbc 36. 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  His CXR shows chronic changes, no cough, no pharyngitis, no GI sxs or abnml LFTs, UA neg, BCX pending. Resp viral PCR neg. PC nml. Unclear source of his infection.  Reassuring that his PC is nml.   Recommendations Monitor overnight.  Repeat Procalcitonin - if remains neg and no fevers would stop abx and dc home once stable If febrile would image abd.  Thank you very much for allowing me to participate in the care of this patient. Please call with questions.   Cheral Marker. Ola Spurr, MD

## 2017-06-30 NOTE — Progress Notes (Addendum)
ANTICOAGULATION CONSULT NOTE - Initial Consult  Pharmacy Consult for warfarin dosing Indication: atrial fibrillation  No Known Allergies  Patient Measurements: Weight: 157 lb (71.2 kg) Heparin Dosing Weight: n/a  Vital Signs: Temp: 97.8 F (36.6 C) (10/04 0047) Temp Source: Oral (10/04 0047) BP: 108/48 (10/04 0047) Pulse Rate: 99 (10/04 0047)  Labs:  Recent Labs  06/29/17 1905 06/29/17 1938  HGB 10.4*  --   HCT 31.2*  --   PLT 308  --   APTT  --  35  LABPROT  --  29.0*  INR  --  2.77  CREATININE 1.01  --   TROPONINI <0.03  --     Estimated Creatinine Clearance: 51.8 mL/min (by C-G formula based on SCr of 1.01 mg/dL).   Medical History: Past Medical History:  Diagnosis Date  . Arthritis   . Atrial fibrillation ([place])   . CHF (congestive heart failure) ([place])   . COPD (chronic obstructive pulmonary disease) ([place])   . Dysrhythmia   . GERD (gastroesophageal reflux disease)   . Hyperlipemia   . Hypertension   . Leukocytosis 01/14/2016  . Prostate cancer ([place])     Medications:  Home dose of warfarin 4 mg daily.  Assessment: INR therapeutic on admission  Goal of Therapy:  INR 2-3    Plan:  Continue home regimen of 4 mg daily. INR daily while on antibiotics.  10/04 AM INR 3.6. Hold warfarin today. Resume 10/5 if INR in acceptable range.  Darrick Greenlaw S 06/30/2017,12:51 AM

## 2017-06-30 NOTE — Progress Notes (Signed)
Chaplain received an order to visit with pt in room 119. Pt was unsure what was wrong with him. Doctors may think its pneumonia. Pt did not request any services from the [facility]. Chaplain is available for follow up as needed.    06/30/17 1640  Clinical Encounter Type  Visited With Patient  Visit Type Initial;Spiritual support  Referral From Nurse  Consult/Referral To Chaplain  Spiritual Encounters  Spiritual Needs Other (Comment)

## 2017-06-30 NOTE — Progress Notes (Signed)
Requires low bed for high falls and INR 3.6 on coumadin.  Asked unit secretary to order low bed. Dorna Bloom RN

## 2017-06-30 NOTE — Consult Note (Addendum)
   [facility] CM Inpatient Consult   06/30/2017  Thomas Townsend 1931-09-11 [id number]   [facility] Care Management referral received.   Chart reviewed. Appears patient is confused and was just admitted.   Spoke with inpatient RNCM, [place]   Not able to determine dc plans as it is still early in hospitalization. Discussed that writer will ask [place] follow up on [place] Management referral again tomorrow.     Marthenia Rolling, MSN-Ed, RN,BSN [facility] Liaison [phone]

## 2017-06-30 NOTE — Plan of Care (Signed)
Problem: Activity: Goal: Risk for activity intolerance will decrease Outcome: Progressing Pt able to sit on the side of bed to eat lunch.

## 2017-07-01 LAB — BASIC METABOLIC PANEL
ANION GAP: 6 (ref 5–15)
BUN: 12 mg/dL (ref 6–20)
CO2: 25 mmol/L (ref 22–32)
Calcium: 8.3 mg/dL — ABNORMAL LOW (ref 8.9–10.3)
Chloride: 109 mmol/L (ref 101–111)
Creatinine, Ser: 0.97 mg/dL (ref 0.61–1.24)
GLUCOSE: 91 mg/dL (ref 65–99)
POTASSIUM: 3.7 mmol/L (ref 3.5–5.1)
Sodium: 140 mmol/L (ref 135–145)

## 2017-07-01 LAB — CBC
HCT: 26 % — ABNORMAL LOW (ref 40.0–52.0)
Hemoglobin: 8.6 g/dL — ABNORMAL LOW (ref 13.0–18.0)
MCH: 31.1 pg (ref 26.0–34.0)
MCHC: 33.2 g/dL (ref 32.0–36.0)
MCV: 93.8 fL (ref 80.0–100.0)
PLATELETS: 226 10*3/uL (ref 150–440)
RBC: 2.77 MIL/uL — AB (ref 4.40–5.90)
RDW: 17.4 % — ABNORMAL HIGH (ref 11.5–14.5)
WBC: 25.3 10*3/uL — AB (ref 3.8–10.6)

## 2017-07-01 LAB — URINE CULTURE: CULTURE: NO GROWTH

## 2017-07-01 LAB — PROTIME-INR
INR: 2.42
Prothrombin Time: 26.1 seconds — ABNORMAL HIGH (ref 11.4–15.2)

## 2017-07-01 LAB — PROCALCITONIN: PROCALCITONIN: 0.29 ng/mL

## 2017-07-01 LAB — SEDIMENTATION RATE: SED RATE: 63 mm/h — AB (ref 0–20)

## 2017-07-01 MED ORDER — WARFARIN SODIUM 4 MG PO TABS
4.0000 mg | ORAL_TABLET | Freq: Every day | ORAL | Status: AC
Start: 2017-07-01 — End: 2017-07-01
  Administered 2017-07-01: 19:00:00 4 mg via ORAL
  Filled 2017-07-01: qty 1

## 2017-07-01 MED ORDER — WARFARIN - PHARMACIST DOSING INPATIENT
Freq: Every day | Status: DC
  Administered 2017-07-01 – 2017-07-02 (×2)

## 2017-07-01 MED ORDER — PANTOPRAZOLE SODIUM 40 MG PO TBEC
40.0000 mg | DELAYED_RELEASE_TABLET | Freq: Every day | ORAL | Status: DC
  Administered 2017-07-01 – 2017-07-03 (×3): 40 mg via ORAL
  Filled 2017-07-01 (×3): qty 1

## 2017-07-01 NOTE — Progress Notes (Signed)
ANTICOAGULATION CONSULT NOTE - Initial Consult  Pharmacy Consult for warfarin dosing Indication: atrial fibrillation  No Known Allergies  Patient Measurements: Height: 5\' 7"  (170.2 cm) Weight: 130 lb (59 kg) IBW/kg (Calculated) : 66.1  Vital Signs: Temp: 98 F (36.7 C) (10/05 0603) Temp Source: Oral (10/04 2141) BP: 116/62 (10/05 0603) Pulse Rate: 90 (10/05 0603)  Labs:  Recent Labs  06/29/17 1905 06/29/17 1938 06/30/17 0521 07/01/17 0523  HGB 10.4*  --  8.2* 8.6*  HCT 31.2*  --  24.9* 26.0*  PLT 308  --  225 226  APTT  --  35  --   --   LABPROT  --  29.0* 35.6* 26.1*  INR  --  2.77 3.60 2.42  CREATININE 1.01  --  0.86 0.97  TROPONINI <0.03  --   --   --     Estimated Creatinine Clearance: 48.2 mL/min (by C-G formula based on SCr of 0.97 mg/dL).  Assessment: Pharmacy consulted to dose and monitor warfarin in this 83 year old male who was taking warfarin PTA for atrial fibrillation.   Home regimen: warfarin 4 mg PO daily INR = 2.8 was therapeutic on admission  Dosing history: Date INR Dose 10/3 2.8  10/4 3.6 Held 10/5 2.4   Goal of Therapy:  INR 2-3  Plan:  INR = 2.4 is therapeutic today. INR was supratherapeutic yesterday, possibly due to the dose of levofloxacin received. Patient is currently on piperacillin/tazobactam.  Will resume patient's home dose of warfarin 4 mg PO daily and monitor INR daily while on antibiotics.  Lenis Noon, PharmD, BCPS Clinical Pharmacist 07/01/2017,8:22 AM

## 2017-07-01 NOTE — Evaluation (Signed)
Clinical/Bedside Swallow Evaluation Patient Details  Name: Thomas Townsend MRN: [id number] Date of Birth: [birth year]  Today's Date: 07/01/2017 Time: SLP Start Time (ACUTE ONLY): 62 SLP Stop Time (ACUTE ONLY): 1120 SLP Time Calculation (min) (ACUTE ONLY): 60 min  Past Medical History:  Past Medical History:  Diagnosis Date  . Arthritis   . Atrial fibrillation ([place])   . CHF (congestive heart failure) ([place])   . COPD (chronic obstructive pulmonary disease) ([place])   . Dysrhythmia   . GERD (gastroesophageal reflux disease)   . Hyperlipemia   . Hypertension   . Leukocytosis 01/14/2016  . Prostate cancer [facility]    Past Surgical History:  Past Surgical History:  Procedure Laterality Date  . CHOLECYSTECTOMY    . COLONOSCOPY WITH PROPOFOL N/A 04/04/2015   Procedure: COLONOSCOPY WITH PROPOFOL;  Surgeon: Manya Silvas, MD;  Location: [facility] ENDOSCOPY;  Service: Endoscopy;  Laterality: N/A;  . ESOPHAGOGASTRODUODENOSCOPY N/A 04/04/2015   Procedure: ESOPHAGOGASTRODUODENOSCOPY (EGD);  Surgeon: Manya Silvas, MD;  Location[facility] ENDOSCOPY;  Service: Endoscopy;  Laterality: N/A;   HPI:  Pt is a 83 y.o. male w/ multiple medical issues including GERD, arthritis, COPD, CHF, HTN, prostate ca who presents with confusion, fever, malaise. Patient was recently admitted here and treated for pneumonia. His daughter states that a few days ago he vomited, and typically has an aspiration event when this happens. In the ED today he meets sepsis criteria, and his x-rays consistent with HCAP or aspiration pneumonia(from recent vomiting event?). He has persistetnly elevated wbc and follows with oncology but has been relatively well living at home. Son and pt do endorse s/s of Reflux for which pt takes Tums or Rolaids. Pt has "had this for years" per Son. Pt is eating a fairly regular diet at home per his report, and he denies any overt coughing or choking at meals; Dtr stated some concern w/ "soups". Pt appeared to  perseverate on wanting to go home stating he was "only on O2 here".    Assessment / Plan / Recommendation Clinical Impression  Pt appears to present w/ adequate oropharyngeal phase swallow function w/ all consistencies assessed; reduced risk for aspiration of po's from an oropharyngeal phase standpoint. Pt consumed po trials w/ no overt s/s of aspiration noted(thin liquids consumed via both cup and straw); clear vocal quality and no decline in respiratory status during/post trials. However, did note min increased respiratory effort w/ increased effort of mastication w/ consecutive trials of mech soft - Rest Break was encouraged and small bites for easier, timely mastication. Oral phase appeared [facility] for trial consistencies given which were Mech Soft in nature and moistened well. W/ mastication of the mech soft trials(tougher solids such as Meats, Breads), increased exertion can occur d/t his baseline medical and Pulonary issues(such as CHF). He will benefit from more broken down foods moistened well. This could also impact the ESOPHAGEAL phase of swallowing - motility of clearing the tougher foods as pt does have GERD per chart. Any ESOPHAGEAL presentation can increase risk for bolus dysmotility and regurgitation which can increase risk for choking/aspirating of REFLUX material which can impact the Pulmonary status over time. Thorough education given on general Reflux precautions; aspiration precautions. Educated pt on the need to monitor food consistencies especially meats and breads and moistening/breaking down thoroughly. Recommend a Mech Soft diet w/ thin liquids BUT watch for Mixed Consistency foods such as soups(maybe a Cream Soup base would be best for pt); rest breaks during oral intake - Slow Down,  small bites/sips. Recommend Pills in Puree (whole) for easier swallowing as needed at home. Recommend f/u w/ GI for GERD management; Dietician for education on nutritional support as needed. SLP Visit  Diagnosis: Dysphagia, unspecified (R13.10) (Esophageal phase dysmotility suspected; GERD baseline)    Aspiration Risk   (reduced from an oropharyngeal phase standpoint w/ precs)    Diet Recommendation  Mech Soft diet consistency w/ moistened foods; Thin liquids (monitor any straw use).  General aspiration precautions;  REFLUX precautions  Medication Administration: Whole meds with puree    Other  Recommendations Recommended Consults: Consider GI evaluation (Dietician f/u) Oral Care Recommendations: Oral care BID;Patient independent with oral care;Staff/trained caregiver to provide oral care Other Recommendations:  (n/a)   Follow up Recommendations None      Frequency and Duration  (n/a)   (n/a)       Prognosis Prognosis for Safe Diet Advancement: Good Barriers to Reach Goals:  (none) Barriers/Prognosis Comment: has good family support      Swallow Study   General Date of Onset: 06/29/17 HPI: Pt is a 83 y.o. male w/ multiple medical issues including GERD, arthritis, COPD, CHF, HTN, prostate ca who presents with confusion, fever, malaise. Patient was recently admitted here and treated for pneumonia. His daughter states that a few days ago he vomited, and typically has an aspiration event when this happens. In the ED today he meets sepsis criteria, and his x-rays consistent with HCAP or aspiration pneumonia(from recent vomiting event?). He has persistetnly elevated wbc and follows with oncology but has been relatively well living at home. Son and pt do endorse s/s of Reflux for which pt takes Tums or Rolaids. Pt has "had this for years" per Son. Pt is eating a fairly regular diet at home per his report, and he denies any overt coughing or choking at meals; Dtr stated some concern w/ "soups". Pt appeared to perseverate on wanting to go home stating he was "only on O2 here".  Type of Study: Bedside Swallow Evaluation Previous Swallow Assessment: none noted Diet Prior to this Study:  Regular;Thin liquids Temperature Spikes Noted: No (wbc elevated) Respiratory Status: Nasal cannula (4 liters) History of Recent Intubation: No Behavior/Cognition: Alert;Cooperative;Pleasant mood (HOH; visually impaired) Oral Cavity Assessment: Within Functional Limits Oral Care Completed by SLP: Recent completion by staff Oral Cavity - Dentition: Adequate natural dentition Vision: Functional for self-feeding (fair) Self-Feeding Abilities: Able to feed self;Needs assist;Needs set up (vision deficits) Patient Positioning: Upright in bed (had to be positioned more upright) Baseline Vocal Quality: Normal Volitional Cough: Strong Volitional Swallow: Able to elicit    Oral/Motor/Sensory Function Overall Oral Motor/Sensory Function: Within functional limits   Ice Chips Ice chips: Not tested   Thin Liquid Thin Liquid: Within functional limits Presentation: Cup;Self Fed;Straw (4 trials w/ cup; 2 trials w/ straw) Other Comments: pt was not overly inclined to drink more    Nectar Thick Nectar Thick Liquid: Not tested   Honey Thick Honey Thick Liquid: Not tested   Puree Puree: Within functional limits Presentation: Self Fed;Spoon (4 trials)   Solid   GO   Solid: Within functional limits (mech soft consistency) Presentation: Self Fed;Spoon (4 trials) Other Comments: pt stated he was done after a few trials    Functional Assessment Tool Used: clinical judgement Functional Limitations: Swallowing Swallow Current Status (O7564): At least 1 percent but less than 20 percent impaired, limited or restricted Swallow Goal Status [phone]): At least 1 percent but less than 20 percent impaired, limited or restricted Swallow  Discharge Status [phone]): At least 1 percent but less than 20 percent impaired, limited or restricted    Orinda Kenner, MS, CCC-SLP Watson,Katherine 07/01/2017,11:31 AM

## 2017-07-01 NOTE — Care Management (Signed)
Confirmed with Doreatha Lew at [facility] [place] that referral has been accepted.

## 2017-07-01 NOTE — Progress Notes (Signed)
[facility] [place] NAME: Thomas Townsend    MR#:  [id number]  DATE OF BIRTH:  03/21/1931  SUBJECTIVE:  CHIEF COMPLAINT:   Chief Complaint  Patient presents with  . Altered Mental Status  . Shortness of Breath  feeling somewhat better REVIEW OF SYSTEMS:  Review of Systems  Unable to perform ROS: Mental status change  HENT: Positive for hearing loss.    DRUG ALLERGIES:  No Known Allergies VITALS:  Blood pressure 116/62, pulse 90, temperature 98 F (36.7 C), resp. rate 20, height 5\' 7"  (1.702 m), weight 59 kg (130 lb), SpO2 93 %. PHYSICAL EXAMINATION:  Physical Exam  Constitutional: He is well-developed, well-nourished, and in no distress.  HENT:  Head: Normocephalic and atraumatic.  Eyes: Pupils are equal, round, and reactive to light. Conjunctivae and EOM are normal.  Neck: Normal range of motion. Neck supple. No tracheal deviation present. No thyromegaly present.  Cardiovascular: Normal rate, regular rhythm and normal heart sounds.   Pulmonary/Chest: He is in respiratory distress. He has decreased breath sounds in the right lower field and the left lower field. He has no wheezes. He has rhonchi. He exhibits no tenderness.  Abdominal: Soft. Bowel sounds are normal. He exhibits no distension. There is no tenderness.  Musculoskeletal: Normal range of motion.  Neurological: He is alert. He is disoriented. No cranial nerve deficit.  Skin: Skin is warm and dry. No rash noted.  Psychiatric: Mood and affect normal.   LABORATORY PANEL:  Male CBC  Recent Labs Lab 07/01/17 0523  WBC 25.3*  HGB 8.6*  HCT 26.0*  PLT 226   ------------------------------------------------------------------------------------------------------------------ Chemistries   Recent Labs Lab 06/29/17 1905  07/01/17 0523  NA 139  < > 140  K 3.8  < > 3.7  CL 103  < > 109  CO2 25  < > 25  GLUCOSE 99  < > 91  BUN 16  < > 12  CREATININE 1.01  < > 0.97    CALCIUM 9.1  < > 8.3*  AST 24  --   --   ALT 11*  --   --   ALKPHOS 92  --   --   BILITOT 0.5  --   --   < > = values in this interval not displayed. RADIOLOGY:  Mr Brain Wo Contrast  Result Date: 06/30/2017 CLINICAL DATA:  Initial evaluation for acute encephalopathy, confusion, fever. EXAM: MRI HEAD WITHOUT CONTRAST TECHNIQUE: Multiplanar, multiecho pulse sequences of the brain and surrounding structures were obtained without intravenous contrast. COMPARISON:  Prior CT from 06/29/2017. FINDINGS: Brain: Diffuse prominence of the CSF containing spaces compatible with generalized cerebral atrophy. Mild for age chronic microvascular changes present within the periventricular white matter. There is a superimposed small remote right occipital lobe infarct. No abnormal foci of restricted diffusion to suggest acute or subacute ischemia. Gray-white matter differentiation well maintained. No evidence for acute or chronic intracranial hemorrhage. No mass lesion, midline shift or mass effect. No hydrocephalus. No extra-axial fluid collection. Major dural sinuses grossly patent. Incidental note made of a partially empty sella. Midline structures intact and normal. Vascular: Major intracranial vascular flow voids are maintained. Skull and upper cervical spine: Craniocervical junction normal. Visualized upper cervical spine within normal limits. Bone marrow signal intensity within normal limits. No scalp soft tissue abnormality. Sinuses/Orbits: Globes and orbital soft tissues within normal limits. Patient status post lens extraction bilaterally. Mild scattered mucosal thickening throughout the paranasal sinuses. No air-fluid  level to suggest acute sinusitis. No mastoid effusion. Inner ear structures normal. Other: None. IMPRESSION: 1. No acute intracranial abnormality identified. 2. Small remote right occipital lobe infarct. 3. Generalized age-related cerebral atrophy with mild chronic small vessel ischemic disease.  Electronically Signed   By: Jeannine Boga M.D.   On: 06/30/2017 19:48   ASSESSMENT AND PLAN:  83 y.o. male who admitted with Confusion, fever, malaise. Patient was recently admitted here and treated for pneumonia.   * Sepsis ([place]) - present on admission due to aspiration pneumonia - Slowly improving  * HCAP (healthcare-associated pneumonia)  - aspiration related - continue zosyn. 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 Today white count is 36; hemoglobin 8.2, platelets normal. Patient is asymptomatic  * Anemia of chronic dz: - Hemoglobin 10.7->8.2->8.6 likely secondary to above primary bone marrow process. No iron deficiency anemia.   * Prostate cancer early stage [follows up with urology; Dr.Tennenbaum; [place]]; patient interested in follow-up at our cancer center      He is critically sick and high risk for cardio-resp failure, multiorgan failure and death.  All the records are reviewed and case discussed with Care Management/Social Worker. Management plans discussed with the patient, nursing and they are in agreement.  CODE STATUS: DNR  TOTAL TIME TAKING CARE OF THIS PATIENT: 35 minutes.   More than 50% of the time was spent in counseling/coordination of care: YES  POSSIBLE D/C IN 2-3 DAYS, DEPENDING ON CLINICAL CONDITION.   Max Sane M.D on 07/01/2017 at 3:29 PM  Between 7am to 6pm - Pager - [phone]  After 6pm go to [email or web address] - Proofreader  [facility] [place] [facility]  Office  [phone]  CC: Primary care physician; Placido Sou, MD  Note: This dictation was prepared with Dragon dictation along with smaller phrase technology. Any transcriptional errors that result from this process are unintentional.

## 2017-07-01 NOTE — Care Management (Signed)
Patient admitted from home with sepsis.  Patient lives at home with his son.  At baseline patient is independent.  PCP Humphrey Rolls.  Pharmacy [facility]. Referral as made to [facility] Case Management by MD.  Palliative has met with patient and he is agreeable to outpatient palliative services.  RNCM has called and left message for [facility] with [place] [facility].  Awaiting return call.

## 2017-07-01 NOTE — Progress Notes (Signed)
[place] INFECTIOUS DISEASE PROGRESS NOTE Date of Admission:  06/29/2017     ID: Thomas Townsend is a 83 y.o. male with fever leukocytosis Principal Problem:   Sepsis ([place]) Active Problems:   A-fib (HCC)   Acid reflux   Chronic diastolic CHF (congestive heart failure) (HCC)   COPD (chronic obstructive pulmonary disease) (HCC)   HCAP (healthcare-associated pneumonia)   Altered mental status   Palliative care encounter   Subjective: No further fevers, MRI brain neg.   ROS  Eleven systems are reviewed and negative except per hpi  Medications:  Antibiotics Given (last 72 hours)    Date/Time Action Medication Dose Rate   06/29/17 1923 New Bag/Given   vancomycin (VANCOCIN) IVPB 1000 mg/200 mL premix 1,000 mg 200 mL/hr   06/29/17 1924 New Bag/Given   piperacillin-tazobactam (ZOSYN) IVPB 3.375 g 3.375 g 100 mL/hr   06/29/17 2232 New Bag/Given   levofloxacin (LEVAQUIN) IVPB 750 mg 750 mg 100 mL/hr   06/30/17 0123 New Bag/Given   piperacillin-tazobactam (ZOSYN) IVPB 3.375 g 3.375 g 12.5 mL/hr   06/30/17 0439 New Bag/Given   vancomycin (VANCOCIN) IVPB 1000 mg/200 mL premix 1,000 mg 200 mL/hr   06/30/17 0920 New Bag/Given   piperacillin-tazobactam (ZOSYN) IVPB 3.375 g 3.375 g 12.5 mL/hr   06/30/17 1530 New Bag/Given   piperacillin-tazobactam (ZOSYN) IVPB 3.375 g 3.375 g 12.5 mL/hr   07/01/17 0005 New Bag/Given   piperacillin-tazobactam (ZOSYN) IVPB 3.375 g 3.375 g 12.5 mL/hr     . digoxin  0.125 mg Oral BH-q7a  . fluticasone furoate-vilanterol  1 puff Inhalation BH-q7a  . folic acid  1 mg Oral Daily  . furosemide  20 mg Oral QODAY  . montelukast  10 mg Oral Daily  . warfarin  4 mg Oral q1800  . Warfarin - Pharmacist Dosing Inpatient   Does not apply q1800    Objective: Vital signs in last 24 hours: Temp:  [97.8 F (36.6 C)-98.1 F (36.7 C)] 98 F (36.7 C) (10/05 0603) Pulse Rate:  [64-102] 90 (10/05 0603) Resp:  [18-21] 20 (10/05 0603) BP: (91-116)/(56-68)  116/62 (10/05 0603) SpO2:  [97 %-99 %] 99 % (10/05 0603) FiO2 (%):  [4 %] 4 % (10/04 1940) Weight:  [59 kg (130 lb)] 59 kg (130 lb) (10/05 4332) Constitutional: He is oriented to person, place, and time. He appears well-developed and well-nourished. No distress.  HENT:  Mouth/Throat: Oropharynx is clear and moist. No oropharyngeal exudate.  Cardiovascular: Normal rate, regular rhythm and normal heart sounds. Exam reveals no gallop and no friction rub.  No murmur heard.  Pulmonary/Chest: Effort normal and breath sounds normal. No respiratory distress. He has no wheezes.  Abdominal: Soft. Bowel sounds are normal. He exhibits no distension. There is no tenderness.  Lymphadenopathy:  He has no cervical adenopathy.  Neurological: He is alert and oriented to person, place, and time.  Skin: Skin is warm and dry. No rash noted. No erythema.  Psychiatric: He has a normal mood and affect. His behavior is normal.   Lab Results  Recent Labs  06/30/17 0521 07/01/17 0523  WBC 36.6* 25.3*  HGB 8.2* 8.6*  HCT 24.9* 26.0*  NA 139 140  K 3.6 3.7  CL 108 109  CO2 25 25  BUN 14 12  CREATININE 0.86 0.97    Microbiology: '@micro' @ Studies/Results: Ct Head Wo Contrast  Result Date: 06/29/2017 CLINICAL DATA:  Altered mental status EXAM: CT HEAD WITHOUT CONTRAST TECHNIQUE: Contiguous axial images were obtained from the base  of the skull through the vertex without intravenous contrast. COMPARISON:  Head CT fifth 03/22/2017 FINDINGS: Brain: No mass lesion, intraparenchymal hemorrhage or extra-axial collection. No evidence of acute cortical infarct. 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 Vascular: No hyperdense vessel or unexpected calcification. Skull: Normal visualized skull base, calvarium and extracranial soft tissues. Sinuses/Orbits: No sinus fluid levels or advanced mucosal thickening. No mastoid effusion. Normal orbits. 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  Electronically Signed   By: Ulyses Jarred M.D.   On: 06/29/2017 20:19   Mr Brain Wo Contrast  Result Date: 06/30/2017 CLINICAL DATA:  Initial evaluation for acute encephalopathy, confusion, fever. EXAM: MRI HEAD WITHOUT CONTRAST TECHNIQUE: Multiplanar, multiecho pulse sequences of the brain and surrounding structures were obtained without intravenous contrast. COMPARISON:  Prior CT from 06/29/2017. FINDINGS: Brain: Diffuse prominence of the CSF containing spaces compatible with generalized cerebral atrophy. Mild for age chronic microvascular changes present within the periventricular white matter. There is a superimposed small remote right occipital lobe infarct. No abnormal foci of restricted diffusion to suggest acute or subacute ischemia. Gray-white matter differentiation well maintained. No evidence for acute or chronic intracranial hemorrhage. No mass lesion, midline shift or mass effect. No hydrocephalus. No extra-axial fluid collection. Major dural sinuses grossly patent. Incidental note made of a partially empty sella. Midline structures intact and normal. Vascular: Major intracranial vascular flow voids are maintained. Skull and upper cervical spine: Craniocervical junction normal. Visualized upper cervical spine within normal limits. Bone marrow signal intensity within normal limits. No scalp soft tissue abnormality. Sinuses/Orbits: Globes and orbital soft tissues within normal limits. Patient status post lens extraction bilaterally. Mild scattered mucosal thickening throughout the paranasal sinuses. No air-fluid level to suggest acute sinusitis. No mastoid effusion. Inner ear structures normal. Other: None. IMPRESSION: 1. No acute intracranial abnormality identified. 2. Small remote right occipital lobe infarct. 3. Generalized age-related cerebral atrophy with mild chronic small vessel ischemic disease. Electronically Signed   By: Jeannine Boga M.D.   On: 06/30/2017 19:48   Dg Chest Port 1  View  Result Date: 06/29/2017 CLINICAL DATA:  Altered mental status and shortness breath.  Fever. EXAM: PORTABLE CHEST 1 VIEW COMPARISON:  Chest radiograph 03/24/2017 FINDINGS: Left basilar opacities are similar the prior study. The aeration of the right lung base is improved. Mild cardiomegaly is unchanged. No pleural effusion or pneumothorax. IMPRESSION: 1. Persistent left basilar opacities compared to 03/24/2017. This may be secondary to recurrent pneumonia or a new aspiration event. Treated pneumonia would be expected to have radiographically resolved in the time since the prior radiograph. 2. Improved aeration of the right lung base. Electronically Signed   By: Ulyses Jarred M.D.   On: 06/29/2017 20:04    Assessment/Plan: KRISHANG READING is a 83 y.o. male admitted with acute onset fevers, and wbc 36. 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  His CXR shows chronic changes, no cough, no pharyngitis, no GI sxs or abnml LFTs, UA neg, BCX pending. Resp viral PCR neg. Unclear source of his infection.  Initial PC was nml but now elevated some. ESR 60 elevated.  No obvious source of infection but would likely treat for bacterial infection for 7 days Recommendations At this point if cultures remain neg would dc on augmentin for 7 total days of abx If febrile again would image abd. Thank you very much for the consult. Will follow with you.  Leonel Ramsay   07/01/2017,  9:09 AM

## 2017-07-01 NOTE — Progress Notes (Signed)
Pharmacy Antibiotic Note  Thomas Townsend is a 83 y.o. male admitted on 06/29/2017 with suspected PNA.   This is day #2 of antibiotic therapy, ID following.  Plan: Continue piperacillin/tazobactam 3.375 g IV q8h EI  Height: 5\' 7"  (170.2 cm) Weight: 130 lb (59 kg) IBW/kg (Calculated) : 66.1  Temp (24hrs), Avg:98 F (36.7 C), Min:97.8 F (36.6 C), Max:98.1 F (36.7 C)   Recent Labs Lab 06/29/17 1905 06/29/17 2237 06/30/17 0521 07/01/17 0523  WBC 36.8*  --  36.6* 25.3*  CREATININE 1.01  --  0.86 0.97  LATICACIDVEN 2.7* 3.0* 1.4  --     Estimated Creatinine Clearance: 48.2 mL/min (by C-G formula based on SCr of 0.97 mg/dL).    No Known Allergies   Antimicrobial: Vancomycin 10/3 > 10/4 Piperacillin/tazobactam 10/3>  Thank you for allowing pharmacy to be a part of this patient's care.  Lenis Noon, Pharm.D., BCPS Clinical Pharmacist 07/01/2017 8:16 AM

## 2017-07-01 NOTE — Care Management Important Message (Signed)
Important Message  Patient Details  Name: Thomas Townsend MRN: [id number] Date of Birth: 04-23-1931   Medicare Important Message Given:  Yes    Beverly Sessions, RN 07/01/2017, 4:41 PM

## 2017-07-01 NOTE — Consult Note (Signed)
   [facility] CM Inpatient Consult   07/01/2017  Thomas Townsend [birth year] [id number]   Referral received by MD for [place] Management services and post hospital discharge follow up related to a diagnosis of pneumonia, COPD and multiple admits in 6 months . Patient was evaluated for community based chronic disease management services with [facility] care Management Program as a benefit of patient's  [employer]. Met with the patient and his son Thomas Townsend at the bedside to explain [place] Management services. Patient oriented to person, place and date and time but stating he did not know why he was in the hospital even though he felt fine. Patient stating he is very hard of hearing and wanted me to let the nurse who will be calling this information. Verbal consent received from patient and son for services. Son wanted his sister Thomas Townsend to look over packet before written consent filled out.  Patient gave [phone] as the best number to reach him. He also gave verbal permission to call his daughter Thomas Townsend at [phone] if he can not be reached. Patient will receive post hospital discharge calls and be evaluated for monthly home visits. [facility] Care Management services does not interfere with or replace any services arranged by the inpatient care management team. RNCM left contact information and [facility] literature at the bedside. Made inpatient RNCM aware that [facility] will be following for care management. For additional questions please contact:   Saben Donigan RN, [facility] Liaison  [phone]) Business Mobile [phone]) Toll free office

## 2017-07-02 LAB — BASIC METABOLIC PANEL
Anion gap: 5 (ref 5–15)
BUN: 15 mg/dL (ref 6–20)
CALCIUM: 8.4 mg/dL — AB (ref 8.9–10.3)
CHLORIDE: 108 mmol/L (ref 101–111)
CO2: 26 mmol/L (ref 22–32)
CREATININE: 1.14 mg/dL (ref 0.61–1.24)
GFR calc non Af Amer: 58 mL/min — ABNORMAL LOW (ref >60)
Glucose, Bld: 100 mg/dL — ABNORMAL HIGH (ref 65–99)
Potassium: 3.7 mmol/L (ref 3.5–5.1)
SODIUM: 139 mmol/L (ref 135–145)

## 2017-07-02 LAB — CBC
HCT: 26.7 % — ABNORMAL LOW (ref 40.0–52.0)
HEMOGLOBIN: 9.1 g/dL — AB (ref 13.0–18.0)
MCH: 31.9 pg (ref 26.0–34.0)
MCHC: 33.8 g/dL (ref 32.0–36.0)
MCV: 94.2 fL (ref 80.0–100.0)
Platelets: 214 10*3/uL (ref 150–440)
RBC: 2.84 MIL/uL — ABNORMAL LOW (ref 4.40–5.90)
RDW: 17.1 % — AB (ref 11.5–14.5)
WBC: 18.9 10*3/uL — ABNORMAL HIGH (ref 3.8–10.6)

## 2017-07-02 LAB — PROTIME-INR
INR: 1.75
Prothrombin Time: 20.3 seconds — ABNORMAL HIGH (ref 11.4–15.2)

## 2017-07-02 LAB — PROCALCITONIN: PROCALCITONIN: 0.16 ng/mL

## 2017-07-02 MED ORDER — WARFARIN SODIUM 4 MG PO TABS
4.0000 mg | ORAL_TABLET | Freq: Every day | ORAL | Status: DC
  Administered 2017-07-02: 18:00:00 4 mg via ORAL
  Filled 2017-07-02: qty 1

## 2017-07-02 NOTE — Progress Notes (Signed)
[place] at [place] NAME: Thomas Townsend    MR#:  [id number]  DATE OF BIRTH:  07-29-1931  SUBJECTIVE:  CHIEF COMPLAINT:   Chief Complaint  Patient presents with  . Altered Mental Status  . Shortness of Breath  feeling somewhat better. Shortness of breath is improving. Daughter at bedside. Patient is hard of hearing but able to answer a few questions REVIEW OF SYSTEMS:  Review of Systems  Constitutional: Positive for malaise/fatigue. Negative for chills and fever.  HENT: Positive for hearing loss.   Respiratory: Positive for cough and sputum production. Negative for shortness of breath and wheezing.   Cardiovascular: Negative for chest pain and palpitations.  Gastrointestinal: Negative for heartburn, nausea and vomiting.  Musculoskeletal: Negative for back pain, falls and myalgias.  Neurological: Negative for dizziness, tingling and headaches.  Psychiatric/Behavioral: Negative for suicidal ideas. The patient does not have insomnia.    DRUG ALLERGIES:  No Known Allergies VITALS:  Blood pressure (!) 106/53, pulse 82, temperature 98.1 F (36.7 C), temperature source Oral, resp. rate 18, height 5\' 7"  (1.702 m), weight 59 kg (130 lb), SpO2 99 %. PHYSICAL EXAMINATION:  Physical Exam  Constitutional: He is well-developed, well-nourished, and in no distress.  HENT:  Head: Normocephalic and atraumatic.  Eyes: Pupils are equal, round, and reactive to light. Conjunctivae and EOM are normal.  Neck: Normal range of motion. Neck supple. No tracheal deviation present. No thyromegaly present.  Cardiovascular: Normal rate, regular rhythm and normal heart sounds.   Pulmonary/Chest: He is in respiratory distress. He has decreased breath sounds in the right lower field and the left lower field. He has no wheezes. He has rhonchi. He exhibits no tenderness.  Abdominal: Soft. Bowel sounds are normal. He exhibits no distension. There is no tenderness.    Musculoskeletal: Normal range of motion.  Neurological: He is alert. He is disoriented. No cranial nerve deficit.  Skin: Skin is warm and dry. No rash noted.  Psychiatric: Mood and affect normal.   LABORATORY PANEL:  Male CBC  Recent Labs Lab 07/02/17 0513  WBC 18.9*  HGB 9.1*  HCT 26.7*  PLT 214   ------------------------------------------------------------------------------------------------------------------ Chemistries   Recent Labs Lab 06/29/17 1905  07/02/17 0513  NA 139  < > 139  K 3.8  < > 3.7  CL 103  < > 108  CO2 25  < > 26  GLUCOSE 99  < > 100*  BUN 16  < > 15  CREATININE 1.01  < > 1.14  CALCIUM 9.1  < > 8.4*  AST 24  --   --   ALT 11*  --   --   ALKPHOS 92  --   --   BILITOT 0.5  --   --   < > = values in this interval not displayed. RADIOLOGY:  No results found. ASSESSMENT AND PLAN:  83 y.o. male who admitted with Confusion, fever, malaise. Patient was recently admitted here and treated for pneumonia.   * Sepsis ([place]) From aspiration pneumonia - present on admission due to aspiration pneumonia - Slowly improving -We'll get speech therapy consult to check swallow evaluation  * HCAP (healthcare-associated pneumonia)  - aspiration related - continue zosyn. 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 Today white count is 36; hemoglobin 8.2, platelets normal. Patient is asymptomatic  * Anemia of chronic dz: - Hemoglobin 10.7->8.2->8.6 -->9.1likely secondary to above primary bone marrow process. No iron deficiency anemia.   * Prostate cancer  early stage [follows up with urology; Dr.Tennenbaum; [place]]; patient interested in follow-up at our cancer center   Consult physical therapy   He is critically sick and high risk for cardio-resp failure, multiorgan failure and death.  All the records are reviewed and case discussed with Care Management/Social Worker. Management plans discussed with the patient, Daughter at bedside and they are in agreement.  CODE STATUS: DNR  TOTAL TIME TAKING CARE OF THIS PATIENT: 35 minutes.   More than 50% of the time was spent in counseling/coordination of care: YES  POSSIBLE D/C IN 2-3 DAYS, DEPENDING ON CLINICAL CONDITION.   [name] M.D on 07/02/2017 at 1:20 PM  Between 7am to 6pm - Pager - [phone]  After 6pm go to [email or web address] - Proofreader  [facility]  Office  [phone]  CC: Primary care physician; Placido Sou, MD  Note: This dictation was prepared with Dragon dictation along with smaller phrase technology. Any transcriptional errors that result from this process are unintentional.

## 2017-07-02 NOTE — Progress Notes (Signed)
[facility] for warfarin dosing Indication: atrial fibrillation  No Known Allergies  Patient Measurements: Height: 5\' 7"  (170.2 cm) Weight: 130 lb (59 kg) IBW/kg (Calculated) : 66.1  Vital Signs: Temp: 98.1 F (36.7 C) (10/06 1004) Temp Source: Oral (10/06 1004) BP: 106/53 (10/06 1004) Pulse Rate: 82 (10/06 1004)  Labs:  Recent Labs  06/29/17 1905  06/29/17 1938 06/30/17 0521 07/01/17 0523 07/02/17 0513  HGB 10.4*  --   --  8.2* 8.6* 9.1*  HCT 31.2*  --   --  24.9* 26.0* 26.7*  PLT 308  --   --  225 226 214  APTT  --   --  35  --   --   --   LABPROT  --   < > 29.0* 35.6* 26.1* 20.3*  INR  --   < > 2.77 3.60 2.42 1.75  CREATININE 1.01  --   --  0.86 0.97 1.14  TROPONINI <0.03  --   --   --   --   --   < > = values in this interval not displayed.  Estimated Creatinine Clearance: 41 mL/min (by C-G formula based on SCr of 1.14 mg/dL).  Assessment: Pharmacy consulted to dose and monitor warfarin in this 83 year old male who was taking warfarin PTA for atrial fibrillation.   Home regimen: warfarin 4 mg PO daily INR = 2.8 was therapeutic on admission  Dosing history: Date INR Dose 10/3 2.8  10/4 3.6 Held 10/5 2.4 4 mg 10/6     1.75  Goal of Therapy:  INR 2-3  Plan:  Continue PTA dosing of warfarin 4 mg daily and f/u AM INR.   Napoleon Form, PharmD, BCPS Clinical Pharmacist 07/02/2017,12:27 PM

## 2017-07-03 LAB — CBC
HCT: 27.8 % — ABNORMAL LOW (ref 40.0–52.0)
Hemoglobin: 9.3 g/dL — ABNORMAL LOW (ref 13.0–18.0)
MCH: 31.5 pg (ref 26.0–34.0)
MCHC: 33.4 g/dL (ref 32.0–36.0)
MCV: 94.1 fL (ref 80.0–100.0)
PLATELETS: 217 10*3/uL (ref 150–440)
RBC: 2.95 MIL/uL — ABNORMAL LOW (ref 4.40–5.90)
RDW: 17.1 % — AB (ref 11.5–14.5)
WBC: 17.5 10*3/uL — AB (ref 3.8–10.6)

## 2017-07-03 LAB — PROTIME-INR
INR: 1.74
PROTHROMBIN TIME: 20.2 s — AB (ref 11.4–15.2)

## 2017-07-03 MED ORDER — WARFARIN SODIUM 5 MG PO TABS
5.0000 mg | ORAL_TABLET | Freq: Once | ORAL | Status: DC
  Filled 2017-07-03: qty 1

## 2017-07-03 MED ORDER — AMOXICILLIN-POT CLAVULANATE 875-125 MG PO TABS: 1.0000 | ORAL_TABLET | Freq: Two times a day (BID) | ORAL | 0 refills | Status: DC

## 2017-07-03 MED ORDER — POLYETHYLENE GLYCOL 3350 17 G PO PACK
17.0000 g | PACK | Freq: Every day | ORAL | Status: DC
  Administered 2017-07-03: 12:00:00 17 g via ORAL
  Filled 2017-07-03: qty 1

## 2017-07-03 MED ORDER — POLYETHYLENE GLYCOL 3350 17 G PO PACK: 17.0000 g | PACK | Freq: Every day | ORAL | 0 refills | Status: DC | PRN

## 2017-07-03 MED ORDER — WARFARIN SODIUM 5 MG PO TABS: 5.0000 mg | ORAL_TABLET | Freq: Once | ORAL | 0 refills | Status: DC

## 2017-07-03 MED ORDER — ACETAMINOPHEN 325 MG PO TABS: 650.0000 mg | ORAL_TABLET | Freq: Four times a day (QID) | ORAL | Status: DC | PRN

## 2017-07-03 NOTE — Discharge Instructions (Signed)
Follow-up with primary care physician or cardiology for repeat PT/INR checked on 07/04/2017 for  further Coumadin management Follow-up with primary care physician in a week Follow-up with cardiology in a week Follow-up with oncology as recommended

## 2017-07-03 NOTE — Progress Notes (Signed)
Patient discharged home per MD order. Home medications returned to patient. All discharge instructions given to patient and daughter and all questions answered.

## 2017-07-03 NOTE — Progress Notes (Signed)
ANTICOAGULATION CONSULT NOTE Pharmacy Consult for warfarin dosing Indication: atrial fibrillation  No Known Allergies  Patient Measurements: Height: 5\' 7"  (170.2 cm) Weight: 131 lb 3.2 oz (59.5 kg) IBW/kg (Calculated) : 66.1  Vital Signs: BP: 99/63 (10/07 0505) Pulse Rate: 84 (10/07 0905)  Labs:  Recent Labs  07/01/17 0523 07/02/17 0513 07/03/17 0520  HGB 8.6* 9.1* 9.3*  HCT 26.0* 26.7* 27.8*  PLT 226 214 217  LABPROT 26.1* 20.3* 20.2*  INR 2.42 1.75 1.74  CREATININE 0.97 1.14  --     Estimated Creatinine Clearance: 41.3 mL/min (by C-G formula based on SCr of 1.14 mg/dL).  Assessment: Pharmacy consulted to dose and monitor warfarin in this 83 year old male who was taking warfarin PTA for atrial fibrillation.   Home regimen: warfarin 4 mg PO daily INR = 2.8 was therapeutic on admission  Dosing history: Date INR Dose 10/3 2.8  10/4 3.6 Held 10/5 2.4 4 mg 10/6     1.75 4 mg  Goal of Therapy:  INR 2-3  Plan:  Warfarin 5 mg tonight and f/u AM INR.    Napoleon Form, PharmD, BCPS Clinical Pharmacist 07/03/2017,11:36 AM

## 2017-07-03 NOTE — Progress Notes (Signed)
No complaints or concerns overnight. Pnt has remained safe and free from injury or fall. No issues with breathing or complaints of SOB. Pnt remains free of fall or near fall on low bed. Bed low and locked, call bell in reach.

## 2017-07-03 NOTE — Care Management Note (Addendum)
Case Management Note  Patient Details  Name: Thomas Townsend MRN: [id number] Date of Birth: [birth year]  Subjective/Objective:    [facility] reports that [facility] is the home 02 provider for Thomas Townsend. Home 02 has been increased from 2L N/C to 3L N/C.Order in chart. Brenton Grills will update the record at [facility] to reflect the new 02 order of 3L N/C.               Action/Plan:   Expected Discharge Date:  07/03/17               Expected Discharge Plan:     In-House Referral:     Discharge planning Services     Post Acute Care Choice:    Choice offered to:     DME Arranged:    DME Agency:     HH Arranged:    HH Agency:     Status of Service:     If discussed at [employer] [facility] [employer], dates discussed:    Additional Comments:  Efe Fazzino A, RN 07/03/2017, 1:29 PM

## 2017-07-03 NOTE — Discharge Summary (Signed)
[facility] NAME: Thomas Townsend    MR#:  [id number]  DATE OF BIRTH:  1930-12-14  DATE OF ADMISSION:  06/29/2017 ADMITTING PHYSICIAN: Lance Coon, MD  DATE OF DISCHARGE:  07/03/17  PRIMARY CARE PHYSICIAN: Placido Sou, MD    ADMISSION DIAGNOSIS:  HCAP (healthcare-associated pneumonia) [J18.9] Sepsis, due to unspecified organism ([place]) [A41.9] Altered mental status, unspecified altered mental status type [R41.82]  DISCHARGE DIAGNOSIS:  Principal Problem:   Sepsis ([place]) Active Problems:   A-fib ([place])   Acid reflux   Chronic diastolic CHF (congestive heart failure) (HCC)   COPD (chronic obstructive pulmonary disease) ([place])   HCAP (healthcare-associated pneumonia)   Altered mental status   Palliative care encounter   SECONDARY DIAGNOSIS:   Past Medical History:  Diagnosis Date  . Arthritis   . Atrial fibrillation ([place])   . CHF (congestive heart failure) ([place])   . COPD (chronic obstructive pulmonary disease) ([place])   . Dysrhythmia   . GERD (gastroesophageal reflux disease)   . Hyperlipemia   . Hypertension   . Leukocytosis 01/14/2016  . Prostate cancer [facility])     HOSPITAL COURSE:   hpi  Thomas Townsend  is a 83 y.o. male who presents with Confusion, fever, malaise. Patient was recently admitted here and treated for pneumonia. His daughter states that a few days ago he vomited, and typically has an aspiration event when this happens. In the ED today he meets sepsis criteria, and his x-rays consistent with HCAP or aspiration pneumonia. Pulse were called for admission   * Sepsis ([place]) From aspiration pneumonia - present on admission due to aspiration pneumonia - clinically improving -speech therapy consult -has recommended mechanical soft diet consistency with moistened foods. Thin liquids Reflux precautions; medication administration; whole meds with pure Blood cultures and urine cultures are negative  *HCAP  (healthcare-associated pneumonia)  - aspiration related - continue zosyn. stop Vancomycin as MRSA screen is negative;Discharge patient home with by mouth Augmentin for 7 more days as recommended by infectious disease  *A-fib ([place]) - continue digoxin including anticoagulation warfarin  Follow-up with cardiology on Monday, 07/04/2017 for repeat PT/INR and further Coumadin management  *Chronic diastolic CHF (congestive heart failure) ([place]) - continue lasix  *COPD (chronic obstructive pulmonary disease) ([place]) - - continue breo, duoneb, singulair  *Acid reflux - home dose PPI  * Myeloproliferative neoplasm (HCC) - Leukocytosis predominant neutrophilia- 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 Today white count is 36; hemoglobin 8.2, platelets normal. Patient is asymptomatic  * Anemia of chronic dz: - Hemoglobin 10.7->8.2->8.6 -->9.1likely secondary to above primary bone marrow process. No iron deficiency anemia.   * Prostate cancer early stage [follows up with urology; Dr.Tennenbaum; [place]];patient interested in follow-up at our cancer center   Consulted physical therapy-no PT needs identified  DISCHARGE CONDITIONS:   stable  CONSULTS OBTAINED:  Treatment Team:  Leonel Ramsay, MD   PROCEDURES  None   DRUG ALLERGIES:  No Known Allergies  DISCHARGE MEDICATIONS:   Current Discharge Medication List    START taking these medications   Details  acetaminophen (TYLENOL) 325 MG tablet Take 2 tablets (650 mg total) by mouth every 6 (six) hours as needed for mild pain (or Fever >/= 101).    amoxicillin-clavulanate (AUGMENTIN) 875-125 MG tablet Take 1 tablet by mouth 2 (two) times daily. Qty: 14 tablet, Refills: 0    polyethylene glycol (MIRALAX / GLYCOLAX) packet Take 17 g by mouth daily as needed. Qty: 14 each, Refills: 0  CONTINUE these medications which have CHANGED   Details  warfarin (COUMADIN) 5 MG tablet Take 1 tablet (5 mg total) by mouth one  time only at 6 PM. Qty: 2 tablet, Refills: 0      CONTINUE these medications which have NOT CHANGED   Details  Besifloxacin HCl (BESIVANCE) 0.6 % SUSP Apply to eye.    BREO ELLIPTA 100-25 MCG/INH AEPB Inhale 1 puff into the lungs every morning.    digoxin (LANOXIN) 0.125 MG tablet Take 0.125 mg by mouth every morning.     folic acid (FOLVITE) 1 MG tablet Take 1 mg by mouth daily.    ipratropium-albuterol (DUONEB) 0.5-2.5 (3) MG/3ML SOLN Take 3 mLs by nebulization every 4 (four) hours as needed.    methotrexate (RHEUMATREX) 2.5 MG tablet TAKE 4 TABLETS (15 MG TOTAL) BY MOUTH EVERY 7 (SEVEN) DAYS. Refills: 4    montelukast (SINGULAIR) 10 MG tablet Take 10 mg by mouth daily.     albuterol (PROVENTIL HFA;VENTOLIN HFA) 108 (90 Base) MCG/ACT inhaler Inhale 2 puffs into the lungs every 6 (six) hours as needed for wheezing or shortness of breath. Qty: 8 g, Refills: 0    ferrous sulfate 325 (65 FE) MG tablet Take 325 mg by mouth every morning. Reported on 02/25/2016    furosemide (LASIX) 20 MG tablet Take 20 mg by mouth every other day. In the morning    Multiple Vitamins-Minerals (PRESERVISION AREDS PO) Take 1 tablet by mouth every morning.          DISCHARGE INSTRUCTIONS:   Follow-up with primary care physician or cardiology for repeat PT/INR checked on 07/04/2017 for  further Coumadin management Follow-up with primary care physician in a week Follow-up with cardiology in a week Follow-up with oncology as recommended  DIET:  cardiac diet; mechanical soft diet consistency with moistened foods. Thin liquids Reflux precautions; medication administration; whole meds with pure   DISCHARGE CONDITION:  Stable  ACTIVITY:  Activity as tolerated  OXYGEN:  Home Oxygen: Yes.     Oxygen Delivery: 3 liters/min via Patient connected to nasal cannula oxygen  DISCHARGE LOCATION:  home   If you experience worsening of your admission symptoms, develop shortness of breath, life  threatening emergency, suicidal or homicidal thoughts you must seek medical attention immediately by calling 911 or calling your MD immediately  if symptoms less severe.  You Must read complete instructions/literature along with all the possible adverse reactions/side effects for all the Medicines you take and that have been prescribed to you. Take any new Medicines after you have completely understood and accpet all the possible adverse reactions/side effects.   Please note  You were cared for by a hospitalist during your hospital stay. If you have any questions about your discharge medications or the care you received while you were in the hospital after you are discharged, you can call the unit and asked to speak with the hospitalist on call if the hospitalist that took care of you is not available. Once you are discharged, your primary care physician will handle any further medical issues. Please note that NO REFILLS for any discharge medications will be authorized once you are discharged, as it is imperative that you return to your primary care physician (or establish a relationship with a primary care physician if you do not have one) for your aftercare needs so that they can reassess your need for medications and monitor your lab values.     Today  Chief Complaint  Patient presents with  .  Altered Mental Status  . Shortness of Breath    Patient is feeling much better. Shortness of breath improved. Daughter at bedside. Requiring 3 L of home oxygen patient chronically loose 2 L of oxygen. Wants to go home ROS:  CONSTITUTIONAL: Denies fevers, chills. Denies any fatigue, weakness.  EYES: Denies blurry vision, double vision, eye pain. EARS, NOSE, THROAT: Denies tinnitus, ear pain, hearing loss. RESPIRATORY: Denies cough, wheeze, shortness of breath.  CARDIOVASCULAR: Denies chest pain, palpitations, edema.  GASTROINTESTINAL: Denies nausea, vomiting, diarrhea, abdominal pain. Denies bright  red blood per rectum. GENITOURINARY: Denies dysuria, hematuria. ENDOCRINE: Denies nocturia or thyroid problems. HEMATOLOGIC AND LYMPHATIC: Denies easy bruising or bleeding. SKIN: Denies rash or lesion. MUSCULOSKELETAL: Denies pain in neck, back, shoulder, knees, hips or arthritic symptoms.  NEUROLOGIC: Denies paralysis, paresthesias.  PSYCHIATRIC: Denies anxiety or depressive symptoms.   VITAL SIGNS:  Blood pressure 99/63, pulse 84, temperature 98.2 F (36.8 C), temperature source Oral, resp. rate 20, height 5\' 7"  (1.702 m), weight 59.5 kg (131 lb 3.2 oz), SpO2 100 %.  I/O:    Intake/Output Summary (Last 24 hours) at 07/03/17 1232 Last data filed at 07/03/17 0505  Gross per 24 hour  Intake              490 ml  Output             1500 ml  Net            -1010 ml    PHYSICAL EXAMINATION:  GENERAL:  83 y.o.-year-old patient lying in the bed with no acute distress.  EYES: Pupils equal, round, reactive to light and accommodation. No scleral icterus. Extraocular muscles intact.  HEENT: Head atraumatic, normocephalic. Oropharynx and nasopharynx clear.  NECK:  Supple, no jugular venous distention. No thyroid enlargement, no tenderness.  LUNGS: Normal breath sounds bilaterally, no wheezing, rales,rhonchi or crepitation. No use of accessory muscles of respiration.  CARDIOVASCULAR: S1, S2 normal. No murmurs, rubs, or gallops.  ABDOMEN: Soft, non-tender, non-distended. Bowel sounds present. No organomegaly or mass.  EXTREMITIES: No pedal edema, cyanosis, or clubbing.  NEUROLOGIC: Cranial nerves II through XII are intact. Muscle strength 5/5 in all extremities. Sensation intact. Gait not checked.  PSYCHIATRIC: The patient is alert and oriented x 3.  SKIN: No obvious rash, lesion, or ulcer.   DATA REVIEW:   CBC  Recent Labs Lab 07/03/17 0520  WBC 17.5*  HGB 9.3*  HCT 27.8*  PLT 217    Chemistries   Recent Labs Lab 06/29/17 1905  07/02/17 0513  NA 139  < > 139  K 3.8  < >  3.7  CL 103  < > 108  CO2 25  < > 26  GLUCOSE 99  < > 100*  BUN 16  < > 15  CREATININE 1.01  < > 1.14  CALCIUM 9.1  < > 8.4*  AST 24  --   --   ALT 11*  --   --   ALKPHOS 92  --   --   BILITOT 0.5  --   --   < > = values in this interval not displayed.  Cardiac Enzymes  Recent Labs Lab 06/29/17 1905  TROPONINI <0.03    Microbiology Results  Results for orders placed or performed during the hospital encounter of 06/29/17  Blood Culture (routine x 2)     Status: None (Preliminary result)   Collection Time: 06/29/17  7:05 PM  Result Value Ref Range Status   Specimen Description BLOOD  BLOOD RIGHT FOREARM  Final   Special Requests   Final    BOTTLES DRAWN AEROBIC AND ANAEROBIC Blood Culture adequate volume   Culture NO GROWTH 4 DAYS  Final   Report Status PENDING  Incomplete  Blood Culture (routine x 2)     Status: None (Preliminary result)   Collection Time: 06/29/17  7:05 PM  Result Value Ref Range Status   Specimen Description BLOOD BLOOD LEFT FOREARM  Final   Special Requests   Final    BOTTLES DRAWN AEROBIC AND ANAEROBIC Blood Culture adequate volume   Culture NO GROWTH 4 DAYS  Final   Report Status PENDING  Incomplete  Urine culture     Status: None   Collection Time: 06/29/17  7:05 PM  Result Value Ref Range Status   Specimen Description URINE, RANDOM  Final   Special Requests NONE  Final   Culture   Final    NO GROWTH Performed at [facility], [place] [address] [place], [place] [address]    Report Status 07/01/2017 FINAL  Final  MRSA PCR Screening     Status: None   Collection Time: 06/30/17 11:04 AM  Result Value Ref Range Status   MRSA by PCR NEGATIVE NEGATIVE Final    Comment:        The GeneXpert MRSA Assay (FDA approved for NASAL specimens only), is one component of a comprehensive MRSA colonization surveillance program. It is not intended to diagnose MRSA infection nor to guide or monitor treatment for MRSA infections.   Respiratory Panel  by PCR     Status: None   Collection Time: 06/30/17 12:48 PM  Result Value Ref Range Status   Adenovirus NOT DETECTED NOT DETECTED Final   Coronavirus 229E NOT DETECTED NOT DETECTED Final   Coronavirus HKU1 NOT DETECTED NOT DETECTED Final   Coronavirus NL63 NOT DETECTED NOT DETECTED Final   Coronavirus OC43 NOT DETECTED NOT DETECTED Final   Metapneumovirus NOT DETECTED NOT DETECTED Final   Rhinovirus / Enterovirus NOT DETECTED NOT DETECTED Final   Influenza A NOT DETECTED NOT DETECTED Final   Influenza B NOT DETECTED NOT DETECTED Final   Parainfluenza Virus 1 NOT DETECTED NOT DETECTED Final   Parainfluenza Virus 2 NOT DETECTED NOT DETECTED Final   Parainfluenza Virus 3 NOT DETECTED NOT DETECTED Final   Parainfluenza Virus 4 NOT DETECTED NOT DETECTED Final   Respiratory Syncytial Virus NOT DETECTED NOT DETECTED Final   Bordetella pertussis NOT DETECTED NOT DETECTED Final   Chlamydophila pneumoniae NOT DETECTED NOT DETECTED Final   Mycoplasma pneumoniae NOT DETECTED NOT DETECTED Final    Comment: Performed at [facility], [place] [address], [place], [place] [address]    RADIOLOGY:  Ct Head Wo Contrast  Result Date: 06/29/2017 CLINICAL DATA:  Altered mental status EXAM: CT HEAD WITHOUT CONTRAST TECHNIQUE: Contiguous axial images were obtained from the base of the skull through the vertex without intravenous contrast. COMPARISON:  Head CT fifth 03/22/2017 FINDINGS: Brain: No mass lesion, intraparenchymal hemorrhage or extra-axial collection. No evidence of acute cortical infarct. 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 Vascular: No hyperdense vessel or unexpected calcification. Skull: Normal visualized skull base, calvarium and extracranial soft tissues. Sinuses/Orbits: No sinus fluid levels or advanced mucosal thickening. No mastoid effusion. Normal orbits. 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 Electronically Signed   By: Ulyses Jarred M.D.   On:  06/29/2017 20:19   Mr Brain Wo Contrast  Result Date: 06/30/2017 CLINICAL DATA:  Initial evaluation for acute encephalopathy, confusion, fever. EXAM: MRI HEAD WITHOUT CONTRAST TECHNIQUE: Multiplanar, multiecho pulse sequences of the brain and surrounding structures were obtained without intravenous contrast. COMPARISON:  Prior CT from 06/29/2017. FINDINGS: Brain: Diffuse prominence of the CSF containing spaces compatible with generalized cerebral atrophy. Mild for age chronic microvascular changes present within the periventricular white matter. There is a superimposed small remote right occipital lobe infarct. No abnormal foci of restricted diffusion to suggest acute or subacute ischemia. Gray-white matter differentiation well maintained. No evidence for acute or chronic intracranial hemorrhage. No mass lesion, midline shift or mass effect. No hydrocephalus. No extra-axial fluid collection. Major dural sinuses grossly patent. Incidental note made of a partially empty sella. Midline structures intact and normal. Vascular: Major intracranial vascular flow voids are maintained. Skull and upper cervical spine: Craniocervical junction normal. Visualized upper cervical spine within normal limits. Bone marrow signal intensity within normal limits. No scalp soft tissue abnormality. Sinuses/Orbits: Globes and orbital soft tissues within normal limits. Patient status post lens extraction bilaterally. Mild scattered mucosal thickening throughout the paranasal sinuses. No air-fluid level to suggest acute sinusitis. No mastoid effusion. Inner ear structures normal. Other: None. IMPRESSION: 1. No acute intracranial abnormality identified. 2. Small remote right occipital lobe infarct. 3. Generalized age-related cerebral atrophy with mild chronic small vessel ischemic disease. Electronically Signed   By: Jeannine Boga M.D.   On: 06/30/2017 19:48   Dg Chest Port 1 View  Result Date: 06/29/2017 CLINICAL DATA:  Altered  mental status and shortness breath.  Fever. EXAM: PORTABLE CHEST 1 VIEW COMPARISON:  Chest radiograph 03/24/2017 FINDINGS: Left basilar opacities are similar the prior study. The aeration of the right lung base is improved. Mild cardiomegaly is unchanged. No pleural effusion or pneumothorax. IMPRESSION: 1. Persistent left basilar opacities compared to 03/24/2017. This may be secondary to recurrent pneumonia or a new aspiration event. Treated pneumonia would be expected to have radiographically resolved in the time since the prior radiograph. 2. Improved aeration of the right lung base. Electronically Signed   By: Ulyses Jarred M.D.   On: 06/29/2017 20:04    EKG:   Orders placed or performed during the hospital encounter of 06/29/17  . EKG 12-Lead  . EKG 12-Lead  . EKG 12-Lead  . EKG 12-Lead      Management plans discussed with the patient, family and they are in agreement.  CODE STATUS:     Code Status Orders        Start     Ordered   06/30/17 0040  Do not attempt resuscitation (DNR)  Continuous    Question Answer Comment  In the event of cardiac or respiratory ARREST Do not call a "code blue"   In the event of cardiac or respiratory ARREST Do not perform Intubation, CPR, defibrillation or ACLS   In the event of cardiac or respiratory ARREST Use medication by any route, position, wound care, and other measures to relive pain and suffering. May use oxygen, suction and manual treatment of airway obstruction as needed for comfort.      06/30/17 0039    Code Status History    Date Active Date Inactive Code Status Order ID Comments User Context   03/22/2017 10:06 PM 03/25/2017  2:41 PM DNR [id number]  Ivor Costa, MD ED   10/05/2016 10:48 AM 10/07/2016  3:34 PM Full Code [id number]  Hillary Bow, MD ED   06/21/2016  2:22 AM 06/26/2016  7:39 PM Partial Code [id number]  Lance Coon, MD  Inpatient   02/26/2016  2:34 PM 02/27/2016  3:03 PM Partial Code [id number]  Vaughan Basta, MD Inpatient    08/06/2015  2:59 AM 08/11/2015  3:21 PM Full Code [id number]  Hower, Aaron Mose, MD ED      TOTAL TIME TAKING CARE OF THIS PATIENT: 45  minutes.   Note: This dictation was prepared with Dragon dictation along with smaller phrase technology. Any transcriptional errors that result from this process are unintentional.   @[facility] @  on 07/03/2017 at 12:32 PM  Between 7am to 6pm - Pager - [phone]  After 6pm go to [email or web address] - password [email or web address] [facility]  [facility]  Office  [phone]  CC: Primary care physician; Placido Sou, MD

## 2017-07-03 NOTE — Progress Notes (Signed)
Physical Therapy Evaluation Patient Details Name: Thomas Townsend MRN: [id number] DOB: [birth year] Today's Date: 07/03/2017   History of Present Illness  Patient is an 83 y.o. male admitted on 29 Jun 2017 with confusion, fever, and malaise; found to have pneumonia. PMH includes atrial fibrillation, chronic diastolic CHF, COPD on 2L home O2 at night, HCAP, and altered mental status.  Clinical Impression  Patient admitted for above listed reasons. Patient previously using 2L O2 at home with reported independence with mobility. Daughter reports history of falling, believed to be from CVA in past. Upon evaluation, patient's oxygen saturations 87% on 2L, increasing to >90% with 3L O2 and ambulation. PT informed RN of oxygen change. Patient may benefit from adjustment to oxygen levels at home to prevent falls in the future, but he is otherwise deemed to be at baseline level of function.    Follow Up Recommendations No PT follow up    Equipment Recommendations  None recommended by PT    Recommendations for Other Services       Precautions / Restrictions Precautions Precautions: Fall Restrictions Weight Bearing Restrictions: No      Mobility  Bed Mobility Overal bed mobility: Independent             General bed mobility comments: Patient performs bed mobility independently.  Transfers Overall transfer level: Independent Equipment used: None             General transfer comment: Patient moves from sit to stand and stand to sit with independence and safe sequencing.  Ambulation/Gait Ambulation/Gait assistance: Min guard Ambulation Distance (Feet): 120 Feet Assistive device: None       General Gait Details: Patient ambulated 120' without AD and 3L O2, maintaining oxygen saturations >90%.  Stairs            Wheelchair Mobility    Modified Rankin (Stroke Patients Only)       Balance Overall balance assessment: History of Falls;Modified Independent                                           Pertinent Vitals/Pain Pain Assessment: No/denies pain    Home Living Family/patient expects to be discharged to:: Private residence Living Arrangements: Children;Other (Comment) (Son) Available Help at Discharge: Family;Available PRN/intermittently Type of Home: House Home Access: Stairs to enter   [employer] of Steps: 3 Home Layout: One level Home Equipment: Environmental consultant - 2 wheels      Prior Function Level of Independence: Needs assistance   Gait / Transfers Assistance Needed: Patient modified independent in gait/self care  ADL's / Homemaking Assistance Needed: Driving/cooking performed by family        Hand Dominance        Extremity/Trunk Assessment   Upper Extremity Assessment Upper Extremity Assessment: Overall WFL for tasks assessed    Lower Extremity Assessment Lower Extremity Assessment: Overall WFL for tasks assessed       Communication   Communication: No difficulties  Cognition Arousal/Alertness: Awake/alert Behavior During Therapy: WFL for tasks assessed/performed Overall Cognitive Status: History of cognitive impairments - at baseline                                 General Comments: Patient oriented at time of evaluation but required input from daughter for some answers to questions about PLOF  General Comments      Exercises     Assessment/Plan    PT Assessment Patent does not need any further PT services  PT Problem List         PT Treatment Interventions      PT Goals (Current goals can be found in the Care Plan section)  Acute Rehab PT Goals Patient Stated Goal: "To go home" PT Goal Formulation: With patient/family Time For Goal Achievement: 07/17/17 Potential to Achieve Goals: Good    Frequency     Barriers to discharge        Co-evaluation               AM-PAC PT "6 Clicks" Daily Activity  Outcome Measure Difficulty turning over in  bed (including adjusting bedclothes, sheets and blankets)?: None Difficulty moving from lying on back to sitting on the side of the bed? : None Difficulty sitting down on and standing up from a chair with arms (e.g., wheelchair, bedside commode, etc,.)?: None Help needed moving to and from a bed to chair (including a wheelchair)?: None Help needed walking in hospital room?: A Little Help needed climbing 3-5 steps with a railing? : A Little 6 Click Score: 22    End of Session Equipment Utilized During Treatment: Gait belt;Oxygen Activity Tolerance: Patient tolerated treatment well Patient left: in bed;with call bell/phone within reach;with family/visitor present Nurse Communication: Mobility status PT Visit Diagnosis: History of falling (Z91.81)    Time: 9702-6378 PT Time Calculation (min) (ACUTE ONLY): 19 min   Charges:   PT Evaluation $PT Eval Low Complexity: 1 Low     PT G Codes:   PT G-Codes **NOT FOR INPATIENT CLASS** Functional Assessment Tool Used: AM-PAC 6 Clicks Basic Mobility;Clinical judgement Functional Limitation: Mobility: Walking and moving around Mobility: Walking and Moving Around Current Status (H8850): At least 20 percent but less than 40 percent impaired, limited or restricted Mobility: Walking and Moving Around Goal Status [phone]): At least 20 percent but less than 40 percent impaired, limited or restricted Mobility: Walking and Moving Around Discharge Status [phone]): At least 20 percent but less than 40 percent impaired, limited or restricted      Dorice Lamas, PT, DPT 07/03/2017, 11:29 AM

## 2017-07-04 ENCOUNTER — Ambulatory Visit: Payer: Medicare Other | Admitting: Primary Care

## 2017-07-04 LAB — CULTURE, BLOOD (ROUTINE X 2)
Culture: NO GROWTH
Culture: NO GROWTH
SPECIAL REQUESTS: ADEQUATE
SPECIAL REQUESTS: ADEQUATE

## 2017-07-05 ENCOUNTER — Other Ambulatory Visit: Payer: Self-pay

## 2017-07-05 NOTE — Patient Outreach (Signed)
Transition of care call:  Discharge date of 07/03/2017 with pneumonia.  Placed call to daughter [name]. Ms. Nori Riis called back and states that her dad is sleeping a lot whenever he is not eating or talking to someone.  Reports palliative care is getting involved.  Daughter states that patient will establish with a new primary MD at [facility] tomorrow at 32 am.  Daughter states that she will know more tomorrow of plan of care. Daughter reports patient has all medications and is taking as prescribed with the exception of augmentin due to interactions with coumadin.  Daughter states she will get this resolved tomorrow.  PLAN: booked appointment to call daughter at 1pm to discuss palliative care and [facility] program.  Tomasa Rand, RN, BSN, CEN [place] Coordinator [phone]

## 2017-07-06 ENCOUNTER — Ambulatory Visit (INDEPENDENT_AMBULATORY_CARE_PROVIDER_SITE_OTHER): Payer: Medicare Other | Admitting: Family Medicine

## 2017-07-06 ENCOUNTER — Encounter: Payer: Self-pay | Admitting: Family Medicine

## 2017-07-06 ENCOUNTER — Other Ambulatory Visit: Payer: Self-pay

## 2017-07-06 VITALS — BP 108/58 | HR 105 | Temp 97.9°F | Ht 64.75 in | Wt 135.5 lb

## 2017-07-06 DIAGNOSIS — I5032 Chronic diastolic (congestive) heart failure: Secondary | ICD-10-CM | POA: Diagnosis not present

## 2017-07-06 DIAGNOSIS — H3530 Unspecified macular degeneration: Secondary | ICD-10-CM

## 2017-07-06 DIAGNOSIS — Z7689 Persons encountering health services in other specified circumstances: Secondary | ICD-10-CM | POA: Diagnosis not present

## 2017-07-06 DIAGNOSIS — J181 Lobar pneumonia, unspecified organism: Secondary | ICD-10-CM | POA: Diagnosis not present

## 2017-07-06 DIAGNOSIS — M069 Rheumatoid arthritis, unspecified: Secondary | ICD-10-CM

## 2017-07-06 DIAGNOSIS — J189 Pneumonia, unspecified organism: Secondary | ICD-10-CM

## 2017-07-06 DIAGNOSIS — I272 Pulmonary hypertension, unspecified: Secondary | ICD-10-CM | POA: Diagnosis not present

## 2017-07-06 DIAGNOSIS — I482 Chronic atrial fibrillation, unspecified: Secondary | ICD-10-CM

## 2017-07-06 DIAGNOSIS — D471 Chronic myeloproliferative disease: Secondary | ICD-10-CM

## 2017-07-06 DIAGNOSIS — J42 Unspecified chronic bronchitis: Secondary | ICD-10-CM | POA: Diagnosis not present

## 2017-07-06 DIAGNOSIS — Z09 Encounter for follow-up examination after completed treatment for conditions other than malignant neoplasm: Secondary | ICD-10-CM | POA: Diagnosis not present

## 2017-07-06 NOTE — Patient Instructions (Addendum)
It was a pleasure to meet you and your daughter today  I will order light weight oxygen and cane   Please follow up in 6 months

## 2017-07-06 NOTE — Progress Notes (Signed)
Subjective:    Patient ID: Thomas Townsend, male    DOB: Mar 20, 1931, 83 y.o.   MRN: [id number]  HPI This is an 83 yo male who presents today to establish care. He is accompanied by his daughter, Gaspar Cola, who works in radiology at [employer]. Mr Humble is hard of hearing. He is a widow for 6 years. He also has a son who lives with him. 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   He is also here for hospital follow up. He was admitted 10/3-10/7/18 with diagnosis of HCAP, sepsis, altered mental status. Has not been on antibiotic since discharge- was prescribed augmentin, but this will interfere with his coumadin and was not filled by the pharmacy. He denies cough, PND or SOB, is regaining his strength according to he and his daughter. Appetite is good and he is sleeping well.   He has history of afib (on warfarin), CHF, COPD, rheumatoid arthritis (on methotrexate), myeloproliferative neoplasm. He has decided to forgo treatment for his leukemia. He had a palliative care consult in the hospital and will be seen at home. Home health is also evaluating him.   [place] one month ago. No falls since. His son, who works nights, lives with him. Patient does a little cooking. Has macular degeneration and is gradually losing sight. Sees Dr. Zigmund Daniel. Injections did not help. Daughter requests a cane.   Wears o2 at night, unable to manage heavy tank when leaving house. Oxygen provider is [facility].    Current Outpatient Prescriptions:  .  acetaminophen (TYLENOL) 325 MG tablet, Take 2 tablets (650 mg total) by mouth every 6 (six) hours as needed for mild pain (or Fever >/= 101)., Disp: , Rfl:  .  albuterol (PROVENTIL HFA;VENTOLIN HFA) 108 (90 Base) MCG/ACT inhaler, Inhale 2 puffs into the lungs every 6 (six) hours as needed for wheezing or shortness of breath., Disp: 8 g, Rfl: 0 .  Besifloxacin HCl (BESIVANCE) 0.6 % SUSP, Apply to eye., Disp: , Rfl:  .  BREO ELLIPTA 100-25 MCG/INH AEPB, Inhale 1 puff into  the lungs every morning., Disp: , Rfl:  .  digoxin (LANOXIN) 0.125 MG tablet, Take 0.125 mg by mouth every morning. , Disp: , Rfl:  .  ferrous sulfate 325 (65 FE) MG tablet, Take 325 mg by mouth every morning. Reported on 02/25/2016, Disp: , Rfl:  .  folic acid (FOLVITE) 1 MG tablet, Take 1 mg by mouth daily., Disp: , Rfl:  .  furosemide (LASIX) 20 MG tablet, Take 20 mg by mouth every other day. In the morning, Disp: , Rfl:  .  methotrexate (RHEUMATREX) 2.5 MG tablet, TAKE 4 TABLETS (15 MG TOTAL) BY MOUTH EVERY 7 (SEVEN) DAYS., Disp: , Rfl: 4 .  montelukast (SINGULAIR) 10 MG tablet, Take 10 mg by mouth daily. , Disp: , Rfl:  .  Multiple Vitamins-Minerals (PRESERVISION AREDS PO), Take 1 tablet by mouth every morning. , Disp: , Rfl:  .  warfarin (COUMADIN) 5 MG tablet, Take 1 tablet (5 mg total) by mouth one time only at 6 PM., Disp: 2 tablet, Rfl: 0   Past Medical History:  Diagnosis Date  . Arthritis   . Atrial fibrillation ([place])   . CHF (congestive heart failure) ([place])   . COPD (chronic obstructive pulmonary disease) ([place])   . Dysrhythmia   . GERD (gastroesophageal reflux disease)   . Hyperlipemia   . Hypertension   . Leukocytosis 01/14/2016  . Prostate cancer [facility])    Past Surgical  History:  Procedure Laterality Date  . CHOLECYSTECTOMY    . COLONOSCOPY WITH PROPOFOL N/A 04/04/2015   Procedure: COLONOSCOPY WITH PROPOFOL;  Surgeon: Manya Silvas, MD;  Location: [facility] ENDOSCOPY;  Service: Endoscopy;  Laterality: N/A;  . ESOPHAGOGASTRODUODENOSCOPY N/A 04/04/2015   Procedure: ESOPHAGOGASTRODUODENOSCOPY (EGD);  Surgeon: Manya Silvas, MD;  Location: [facility] ENDOSCOPY;  Service: Endoscopy;  Laterality: N/A;   Family History  Problem Relation Age of Onset  . Hypertension Other    Social History  Substance Use Topics  . Smoking status: Former Smoker    Years: 20.00    Types: Cigarettes  . Smokeless tobacco: Never Used  . Alcohol use 0.0 oz/week     Comment: Wine every once in a while        Review of Systems  Constitutional: Negative for fatigue and fever.  Respiratory: Negative for cough, chest tightness, shortness of breath and wheezing.   Cardiovascular: Negative for chest pain and leg swelling.  Gastrointestinal: Negative for abdominal pain, constipation and diarrhea.  Genitourinary: Negative for difficulty urinating and frequency.  Neurological: Negative for dizziness, light-headedness and headaches.       Objective:   Physical Exam  Constitutional: He is oriented to person, place, and time. He appears well-developed and well-nourished. No distress.  Appears stated age.   HENT:  Head: Normocephalic and atraumatic.  Mouth/Throat: Oropharynx is clear and moist.  Eyes: Conjunctivae are normal.  Cardiovascular: Normal rate and normal heart sounds.  An irregular rhythm present.  Pulmonary/Chest: Effort normal and breath sounds normal. No respiratory distress. He has no wheezes. He has no rales.  Musculoskeletal: He exhibits no edema.  Neurological: He is alert and oriented to person, place, and time.  Skin: Skin is warm and dry. He is not diaphoretic.  Psychiatric: He has a normal mood and affect. His behavior is normal. Judgment and thought content normal.  Vitals reviewed.     BP (!) 108/58 (BP Location: Right Arm, Patient Position: Sitting, Cuff Size: Normal)   Pulse (!) 105   Temp 97.9 F (36.6 C) (Oral)   Ht 5' 4.75" (1.645 m)   Wt 135 lb 8 oz (61.5 kg)   SpO2 95%   BMI 22.72 kg/m  Wt Readings from Last 3 Encounters:  07/06/17 135 lb 8 oz (61.5 kg)  07/03/17 131 lb 3.2 oz (59.5 kg)  03/25/17 133 lb 6.4 oz (60.5 kg)       Assessment & Plan:  1. Encounter to establish care - reviewed records and recent labs, discussed wishes with patient and his daughter - follow up in 6 months- will alternate with cardiology  2. Chronic atrial fibrillation (HCC) - continue current meds, follow up with cardiology as scheduled next week  3. Rheumatoid  arthritis involving multiple joints (HCC) - continued follow up with rheumatology. He is on methotrexate and most recent labs are normal  4. Myeloproliferative neoplasm (HCC) - WBC decreased from prior  - patient does not desire any treatment - continued follow up with oncology and palliative care  5. Macular degeneration, unspecified laterality, unspecified type - continue follow up with Dr. Zigmund Daniel - will order cane to assist with ambulation  6. Hospital discharge follow-up - no labs needed today - [facility]/home health/palliative care following  7. Pneumonia of both lower lobes due to infectious organism - currently without cough, SOB, wheeze, PND with clear lungs - no additional antibiotics at this time, discussed s/s pneumonia with patient and his daughter and they agreed to report any symptoms -  he is on home O2 already, will request portable, light weight O2 tank from [place], FNP-BC  [facility] [place]  07/07/2017 10:31 AM

## 2017-07-06 NOTE — Patient Outreach (Signed)
Transition of care call:  Placed call to patient and spoke with daughter [name].  [name] reports patient just got home from MD appointment. Reports patient had a good check up. Reports lungs clear and no RX for an antibiotic.  Daughter reports that she will use palliative care as needed.  Reports patient has all medications.  Daughter states no home health at this time.  Reports MD has ordered a light weight portable oxygen.     PLAN:offered home visit for 07/12/2017 and daughter has accepted.  Will plan full assessment to determine needs.   Tomasa Rand, RN, BSN, CEN [facility] [employer] [phone]

## 2017-07-12 ENCOUNTER — Other Ambulatory Visit: Payer: Self-pay

## 2017-07-12 NOTE — Patient Outreach (Signed)
[facility]) Care Management   07/12/2017  Thomas Townsend [birth year] [id number]  Thomas Townsend is an 83 y.o. male Arrived for home visit.  Patient unsure why I was there to see him.  Daughter Thomas Townsend arrive after visit started.  Subjective:  Explained reason for visit and patient agreed.  Patient reports that he is " unconcerned" about his health. Reports that his daughter is more concerned than he is. Reports he feels like he is doing pretty well. Reports only using oxygen at night.  Patient reports that he has been admitted about every 3 months with aspiration pneumonia.  Patient reports that his vision has worsened in the last year.  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  Patient reports that he gets a " normal amount of shortness of breath with ambulation or exercise".  Objective:  Awake and alert. Extremely hard of hearing. Not wearing his hearing aids during home visit. Ambulating well without any assistive devices.   Vitals:   07/12/17 1459  BP: 120/72  Pulse: 68  Resp: 18  SpO2: 95%  Weight: 138 lb (62.6 kg)  Height: 1.676 m (5\' 6" )   Review of Systems  Constitutional: Negative.   HENT: Negative.   Eyes: Positive for blurred vision.       Complains of poor vision  Respiratory: Positive for cough and shortness of breath.   Cardiovascular: Positive for leg swelling.  Gastrointestinal: Negative.   Genitourinary: Negative.   Musculoskeletal: Negative.   Skin: Negative.   Neurological: Negative.   Endo/Heme/Allergies: Bruises/bleeds easily.  Psychiatric/Behavioral: Negative.     Physical Exam  Constitutional: He is oriented to person, place, and time. He appears well-developed and well-nourished.  Cardiovascular: Normal rate and intact distal pulses.   Irregular heart rate.  Respiratory: Effort normal and breath sounds normal.  GI: Soft. Bowel sounds are normal.   Musculoskeletal: Normal range of motion. He exhibits edema.  1 plus edema above socks.   Neurological: He is alert and oriented to person, place, and time.  Skin: Skin is warm and dry.  Feet without lesions or sores  Psychiatric: He has a normal mood and affect. His behavior is normal. Judgment and thought content normal.    Encounter Medications:   Outpatient Encounter Prescriptions as of 07/12/2017  Medication Sig  . acetaminophen (TYLENOL) 325 MG tablet Take 2 tablets (650 mg total) by mouth every 6 (six) hours as needed for mild pain (or Fever >/= 101).  Marland Kitchen albuterol (PROVENTIL HFA;VENTOLIN HFA) 108 (90 Base) MCG/ACT inhaler Inhale 2 puffs into the lungs every 6 (six) hours as needed for wheezing or shortness of breath.  Marland Kitchen BREO ELLIPTA 100-25 MCG/INH AEPB Inhale 1 puff into the lungs every morning.  . digoxin (LANOXIN) 0.125 MG tablet Take 0.125 mg by mouth every morning.   . ferrous sulfate 325 (65 FE) MG tablet Take 325 mg by mouth every morning. Reported on 2/42/3536  . folic acid (FOLVITE) 1 MG tablet Take 1 mg by mouth daily.  . furosemide (LASIX) 20 MG tablet Take 20 mg by mouth every other day. In the morning  . methotrexate (RHEUMATREX) 2.5 MG tablet TAKE 4 TABLETS (15 MG TOTAL) BY MOUTH EVERY 7 (SEVEN) DAYS.  Marland Kitchen montelukast (SINGULAIR) 10 MG tablet Take 10 mg by mouth daily.   . Multiple Vitamins-Minerals (PRESERVISION AREDS PO) Take 1 tablet by mouth 2 (two) times daily.   Marland Kitchen  Besifloxacin HCl (BESIVANCE) 0.6 % SUSP Apply to eye.  . warfarin (COUMADIN) 5 MG tablet Take 1 tablet (5 mg total) by mouth one time only at 6 PM. (Patient taking differently: Take 4 mg by mouth one time only at 6 PM. )   No facility-administered encounter medications on file as of 07/12/2017.     Functional Status:   In your present state of health, do you have any difficulty performing the following activities: 07/12/2017 06/30/2017  Hearing? Tempie Donning  Vision? Y Y  Difficulty concentrating or making  decisions? N N  Walking or climbing stairs? N N  Dressing or bathing? N N  Doing errands, shopping? Y N  Comment daughter drives to appointments.  -  Preparing Food and eating ? N -  Using the Toilet? N -  In the past six months, have you accidently leaked urine? N -  Do you have problems with loss of bowel control? N -  Managing your Medications? N -  Managing your Finances? N -  Housekeeping or managing your Housekeeping? N -  Some recent data might be hidden    Fall/Depression Screening:    Fall Risk  07/12/2017 07/07/2016  Falls in the past year? Yes No  Number falls in past yr: 1 -  Injury with Fall? No -  Risk for fall due to : Impaired vision -  Follow up Falls prevention discussed -   PHQ 2/9 Scores 07/12/2017 07/07/2016  PHQ - 2 Score 1 0    Assessment:   (1) reviewed [facility] transition of care program.  Provided new patient packet.  Patient already had magnet. Provided new [facility] calendar with my contact card enclosed. Reviewed consent on file and patient declines any needs changes or updates (2) recent admission for pneumonia. Patient declines any warning signs of getting sick until he has a high fever.   (3) oxygen at night only. (4) remains active on his farm. (5) worsening vision. Continues to drive short distances from home. (6) takes all his own medications without assistance. Uses a lighted magnifying glass.  Declines need for pill planners. (7) completed advanced directives.    Plan:  (1) consent in chart.  Will call patient weekly for transition of care.  Next outreach within 1 week. (2) reviewed importance of following aspiration precautions. Not talking while eating and sitting upright.  Reviewed importance of chewing well. Reviewed early signs of infections.  Encouraged patient to call daughter for any changes in condition to get in to see MD asap. (3) Encouraged patient to use oxygen as directed. (4) encouraged patient to be mindful of his body and rest as  needed. (5) reviewed concern for driving.  Patient no longer drives at night.   (6) Encouraged patient not to miss any medications. (7) encouraged daughter to keep a copy of advance directives available whenever patient goes to the hospital.  Care planning and goal setting and patients primary goal is " not go back to that hospital" This note and barrier letter sent to MD.   [facility] CM Care Plan Problem One     Most Recent Value  Care Plan Problem One  Recent admission for COPD and pneumonia.   Role Documenting the Problem One  Care Management [place] for Problem One  Active  [facility] Long Term Goal   Patient will report no readmission for pneumonia for the next 60 days.   [facility] Long Term Goal Start Date  07/06/17  Interventions for Problem One  Long Term Goal  Home visit completed. Encouraged patient to rest as needed and take all medications as prescribed.   [facility] CM Short Term Goal #1   Patient will report recieving new portable oxygen in the next 2 weeks.  [facility] CM Short Term Goal #1 Start Date  07/06/17  Interventions for Short Term Goal #1  will continue to follow up   [facility] CM Short Term Goal #2   Patient will report taking all medications as prescribed in the next 30 days.   [facility] CM Short Term Goal #2 Start Date  07/06/17  Interventions for Short Term Goal #2  Discussed concern about patients vision and not using a pill planner and patient is comfortable taking his own medications the way he currently is taking them.  will continue to assess.     Tomasa Rand, RN, BSN, CEN [facility] [employer] [phone]

## 2017-07-13 ENCOUNTER — Ambulatory Visit: Payer: Medicare Other | Admitting: Primary Care

## 2017-07-13 DIAGNOSIS — J449 Chronic obstructive pulmonary disease, unspecified: Secondary | ICD-10-CM | POA: Diagnosis not present

## 2017-07-13 DIAGNOSIS — I482 Chronic atrial fibrillation: Secondary | ICD-10-CM | POA: Diagnosis not present

## 2017-07-13 DIAGNOSIS — J690 Pneumonitis due to inhalation of food and vomit: Secondary | ICD-10-CM | POA: Diagnosis not present

## 2017-07-13 DIAGNOSIS — I10 Essential (primary) hypertension: Secondary | ICD-10-CM | POA: Diagnosis not present

## 2017-07-14 DIAGNOSIS — I10 Essential (primary) hypertension: Secondary | ICD-10-CM | POA: Insufficient documentation

## 2017-07-19 ENCOUNTER — Other Ambulatory Visit: Payer: Self-pay

## 2017-07-19 NOTE — Patient Outreach (Signed)
Transition of care: Placed call to patient who reports that he is doing well. Reports no shortness of breath. Reports weight is good. Unable to provide exact weight for today. Denies any new problems or concerns today.  PLAN: Will continue weekly transition of care calls.  Tomasa Rand, RN, BSN, CEN [facility] [employer] [phone]

## 2017-07-25 ENCOUNTER — Other Ambulatory Visit: Payer: Self-pay

## 2017-07-25 NOTE — Patient Outreach (Signed)
Transition of care: Placed call to patient who reports that he is doing well. Reports no weight gain. States weight is 133-134 pounds. Reports that he is eating well.  Reports no new problems or concerns today. States breathing is good.  PLAN: Will continue weekly transition of care calls.  Tomasa Rand, RN, BSN, CEN [facility] [employer] [phone]

## 2017-07-26 DIAGNOSIS — J189 Pneumonia, unspecified organism: Secondary | ICD-10-CM | POA: Diagnosis not present

## 2017-08-02 ENCOUNTER — Other Ambulatory Visit: Payer: Self-pay

## 2017-08-03 ENCOUNTER — Telehealth: Payer: Self-pay | Admitting: *Deleted

## 2017-08-03 NOTE — Telephone Encounter (Signed)
Paper referral completed to be faxed.

## 2017-08-03 NOTE — Telephone Encounter (Signed)
Copied from [place] [id number]. Topic: Referral - Status >> Aug 03, 2017 11:03 AM Arletha Grippe wrote: Reason for CRM: Langley Gauss called to request palliative care referral. Please call denise [phone]

## 2017-08-03 NOTE — Patient Outreach (Signed)
Transition of care:  08/02/2017: Placed call to patient who reports that he is doing well. 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 Denies any shortness of breath.  Reports that BREO price has increased from 45.00 to 148.00  Reports that he call insurance carrier and pharmacy and he is in the doughnut hole.  Reports that he can get his medication but it is expensive.  Reviewed that his benefit for pharmacy will start over at the beginning of the next year.   PLAN: will continue weekly transition of care for 1 more week.  Tomasa Rand, RN, BSN, CEN [facility] [employer] [phone]

## 2017-08-05 ENCOUNTER — Encounter: Payer: Self-pay | Admitting: Pulmonary Disease

## 2017-08-05 ENCOUNTER — Ambulatory Visit (INDEPENDENT_AMBULATORY_CARE_PROVIDER_SITE_OTHER): Payer: Medicare Other | Admitting: Pulmonary Disease

## 2017-08-05 VITALS — BP 102/60 | HR 83 | Ht 66.0 in | Wt 141.0 lb

## 2017-08-05 DIAGNOSIS — I272 Pulmonary hypertension, unspecified: Secondary | ICD-10-CM | POA: Diagnosis not present

## 2017-08-05 DIAGNOSIS — J8410 Pulmonary fibrosis, unspecified: Secondary | ICD-10-CM

## 2017-08-05 DIAGNOSIS — J439 Emphysema, unspecified: Secondary | ICD-10-CM

## 2017-08-05 NOTE — Patient Instructions (Signed)
Lung function tests ordered You may experiment with the Breo inhaler -one week on, one week off, etc. to determine whether provides any benefit to breathing  Follow-up in 6-8 weeks review the lung function test

## 2017-08-08 ENCOUNTER — Encounter: Payer: Self-pay | Admitting: Pulmonary Disease

## 2017-08-08 NOTE — Progress Notes (Signed)
PULMONARY CONSULT NOTE  Requesting MD/Service: Self referred Date of initial consultation: 08/05/17 Reason for consultation: h/o COPD  PT PROFILE: 83 y.o. male former remote smoker previously seen by Dr Humphrey Rolls with RA, prior dx of COPD and pulmonary fibrosis  DATA: Echocardiogram 08/08/15: LVEF 55-60%. Mild AS. RVSP est 55 mmHg CT chest 06/24/16: Mild cardiac enlargement, multi vessel coronary artery calcifications, pleural effusions and interstitial edema suspicious for CHF. Diffuse bronchial wall thickening with emphysema, as above; imaging findings suggestive of underlying COPD. Borderline enlarged right paratracheal lymph nodes identified. Nonspecific in the setting of CHF CXR 06/29/17: lingular opacity  HPI:  Recently hospitalized @ [facility] with diagnosis of PNA. Has PMH of "COPD" and RA. Also prior CT chest revealing pulmonary fibrosis. Per his report, his "COPD has gone away" and he denies significant DOE. He reports that he can comfortably ambulate 2 flights of stairs. Denies CP, fever, purulent sputum, hemoptysis, LE edema and calf tenderness.   He has been prescribed Breo inhaler but is unable to discern any benefit and doesn't use it consistently. Notably, he is on methotrexate 10mg  weekly for RA.   Past Medical History:  Diagnosis Date  . Rheumatoid arthritis   . Atrial fibrillation ([place])   . CHF (congestive heart failure) ([place])   . COPD (chronic obstructive pulmonary disease) ([place])   . Dysrhythmia   . GERD (gastroesophageal reflux disease)   . Hyperlipemia   . Hypertension   . Leukocytosis 01/14/2016  . Prostate cancer ([place])   . 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 CHOLECYSTECTOMY    . EYE SURGERY      MEDICATIONS: I have reviewed all medications and confirmed regimen as documented  Social History   Socioeconomic History  . Marital status: Widowed    Spouse name: Not on file  . Number of children: Not on file   . Years of education: Not on file  . Highest education level: Not on file  Social Needs  . Financial resource strain: Not on file  . Food insecurity - worry: Not on file  . Food insecurity - inability: Not on file  . Transportation needs - medical: Not on file  . Transportation needs - non-medical: Not on file  Occupational History  . Not on file  Tobacco Use  . Smoking status: Former Smoker    Years: 20.00    Types: Cigarettes  . Smokeless tobacco: Never Used  Substance and Sexual Activity  . Alcohol use: Yes    Alcohol/week: 0.6 oz    Types: 1 Cans of beer per week    Comment: Wine every once in a while  . Drug use: No  . Sexual activity: No  Other Topics Concern  . Not on file  Social History Narrative   ** Merged History Encounter **        Family History  Problem Relation Age of Onset  . Hypertension Other     ROS: No fever, myalgias/arthralgias, unexplained weight loss or weight gain No new focal weakness or sensory deficits No otalgia, hearing loss, visual changes, nasal and sinus symptoms, mouth and throat problems No neck pain or adenopathy No abdominal pain, N/V/D, diarrhea, change in bowel pattern No dysuria, change in urinary pattern   Vitals:   08/05/17 1532 08/05/17 1535  BP:  102/60  Pulse:  83  SpO2:  95%  Weight: 64 kg (141 lb)   Height: 5\' 6"  (1.676 m)  RA   EXAM:  Gen: WDWN in NAD HEENT: NCAT, sclerae white, oropharynx normal Neck: NO LAN, no JVD noted Lungs: full BS, normal percussion note, no adventitious sounds Cardiovascular: Reg rate, normal rhythm, no M noted Abdomen: Soft, NT, +BS Ext: no C/C/E Neuro: PERRL, EOMI, motor/sensory grossly intact Skin: No lesions noted   DATA:   BMP Latest Ref Rng & Units 07/02/2017 07/01/2017 06/30/2017  Glucose 65 - 99 mg/dL 100(H) 91 122(H)  BUN 6 - 20 mg/dL 15 12 14   Creatinine 0.61 - 1.24 mg/dL 1.14 0.97 0.86  Sodium 135 - 145 mmol/L 139 140 139  Potassium 3.5 - 5.1 mmol/L 3.7 3.7 3.6   Chloride 101 - 111 mmol/L 108 109 108  CO2 22 - 32 mmol/L 26 25 25   Calcium 8.9 - 10.3 mg/dL 8.4(L) 8.3(L) 7.7(L)    CBC Latest Ref Rng & Units 07/03/2017 07/02/2017 07/01/2017  WBC 3.8 - 10.6 K/uL 17.5(H) 18.9(H) 25.3(H)  Hemoglobin 13.0 - 18.0 g/dL 9.3(L) 9.1(L) 8.6(L)  Hematocrit 40.0 - 52.0 % 27.8(L) 26.7(L) 26.0(L)  Platelets 150 - 440 K/uL 217 214 226    CXR:    IMPRESSION:     ICD-10-CM   1. Pulmonary emphysema, unspecified emphysema type ([place]) J43.9 Pulmonary Function Test [facility] Only  2. Pulmonary fibrosis -mild.  Likely due to rheumatoid arthritis J84.10 Pulmonary Function Test [facility] Only     PLAN:  PFTs ordered Instructed that he may experiment with the Breo inhaler -one week on, one week off, etc. to determine whether provides any benefit   Follow-up in 6-8 weeks to review the lung function test   Merton Border, MD PCCM service Mobile [phone] Pager [phone] 08/08/2017 2:26 PM

## 2017-08-09 ENCOUNTER — Other Ambulatory Visit: Payer: Self-pay

## 2017-08-09 NOTE — Patient Outreach (Signed)
Transition of care:  Placed call to patient who reports that he is doing well. Reports his breathing is normal. Reports that he has all his medications and is taking them as prescribed. Reports that he also has his portable oxygen.   Patient states that he is eating and sleeping well.  Denies any new concern today.    Reviewed with patient that he has completed the 30 day transition of care successfully.  Reviewed with patient that I would call him in 30 days for another follow up.  Patient agreed.   PLAN: follow up in 1 month. Encouraged patient to call sooner if needed.   Thomas Rand, RN, BSN, CEN [facility] [employer] [phone]

## 2017-08-15 ENCOUNTER — Other Ambulatory Visit: Payer: Self-pay | Admitting: Family Medicine

## 2017-08-15 ENCOUNTER — Telehealth: Payer: Self-pay | Admitting: Family Medicine

## 2017-08-15 DIAGNOSIS — C946 Myelodysplastic disease, not classified: Secondary | ICD-10-CM | POA: Diagnosis not present

## 2017-08-15 MED ORDER — DIGOXIN 125 MCG PO TABS: 0.1250 mg | ORAL_TABLET | ORAL | 1 refills | Status: DC

## 2017-08-15 MED ORDER — FUROSEMIDE 20 MG PO TABS: 20.0000 mg | ORAL_TABLET | ORAL | 1 refills | Status: DC

## 2017-08-15 NOTE — Telephone Encounter (Signed)
I have sent 3 month supply to patient's pharmacy.  Please let his daughter know.

## 2017-08-15 NOTE — Telephone Encounter (Signed)
Copied from [place] [id number]. Topic: Quick Communication - See Telephone Encounter >> Aug 15, 2017  2:04 PM Bea Graff, NT wrote: CRM for notification. See Telephone encounter for: Patients daughter calling requesting a refill of 2 medicines that Dr. Humphrey Rolls use to fill for this pt but pt is no longer seeing Dr. Humphrey Rolls and is now a pt of Clarene Reamer. Digoxin 158mcg and furosemide 20mg  for leg swelling. Pt uses [facility] [address].   08/15/17.

## 2017-08-15 NOTE — Telephone Encounter (Signed)
I was unsure how much of each medication Ms. Thomas Townsend would like this pt. To have. Please advise. Thanks.

## 2017-08-16 NOTE — Telephone Encounter (Signed)
Called and left voicemail on daughters cell phone informing her of medication refill.

## 2017-08-22 DIAGNOSIS — H02834 Dermatochalasis of left upper eyelid: Secondary | ICD-10-CM | POA: Diagnosis not present

## 2017-08-22 DIAGNOSIS — H02832 Dermatochalasis of right lower eyelid: Secondary | ICD-10-CM | POA: Diagnosis not present

## 2017-08-22 DIAGNOSIS — H353134 Nonexudative age-related macular degeneration, bilateral, advanced atrophic with subfoveal involvement: Secondary | ICD-10-CM | POA: Diagnosis not present

## 2017-08-22 DIAGNOSIS — H02835 Dermatochalasis of left lower eyelid: Secondary | ICD-10-CM | POA: Diagnosis not present

## 2017-08-22 DIAGNOSIS — H02831 Dermatochalasis of right upper eyelid: Secondary | ICD-10-CM | POA: Diagnosis not present

## 2017-08-25 ENCOUNTER — Encounter (INDEPENDENT_AMBULATORY_CARE_PROVIDER_SITE_OTHER): Payer: Medicare Other | Admitting: Ophthalmology

## 2017-08-25 DIAGNOSIS — H353221 Exudative age-related macular degeneration, left eye, with active choroidal neovascularization: Secondary | ICD-10-CM

## 2017-08-25 DIAGNOSIS — H353114 Nonexudative age-related macular degeneration, right eye, advanced atrophic with subfoveal involvement: Secondary | ICD-10-CM

## 2017-08-25 DIAGNOSIS — H33301 Unspecified retinal break, right eye: Secondary | ICD-10-CM

## 2017-08-25 DIAGNOSIS — H35341 Macular cyst, hole, or pseudohole, right eye: Secondary | ICD-10-CM

## 2017-08-25 DIAGNOSIS — H43812 Vitreous degeneration, left eye: Secondary | ICD-10-CM | POA: Diagnosis not present

## 2017-08-26 DIAGNOSIS — J189 Pneumonia, unspecified organism: Secondary | ICD-10-CM | POA: Diagnosis not present

## 2017-09-01 ENCOUNTER — Ambulatory Visit: Payer: Medicare Other

## 2017-09-05 ENCOUNTER — Ambulatory Visit: Payer: Medicare Other | Admitting: Pulmonary Disease

## 2017-09-06 ENCOUNTER — Other Ambulatory Visit: Payer: Self-pay

## 2017-09-06 ENCOUNTER — Ambulatory Visit: Payer: Self-pay

## 2017-09-06 NOTE — Patient Outreach (Signed)
Case closure telephone call:  Placed call to patient for 60 day follow up. Patient reports that he is doing well. Reports that his breathing is good.  Denies any recent concerns today.  Reviewed case closure with patient as he has met his goals. Patient agrees to case closure.  PLAN: Will close case. Will mail letter to patient and notify MD.  Tomasa Rand, RN, BSN, CEN [place] Coordinator [phone]

## 2017-09-14 ENCOUNTER — Inpatient Hospital Stay (HOSPITAL_BASED_OUTPATIENT_CLINIC_OR_DEPARTMENT_OTHER): Payer: Medicare Other | Admitting: Internal Medicine

## 2017-09-14 ENCOUNTER — Inpatient Hospital Stay: Payer: Medicare Other | Attending: Internal Medicine

## 2017-09-14 ENCOUNTER — Other Ambulatory Visit: Payer: Self-pay

## 2017-09-14 VITALS — BP 122/80 | HR 71 | Temp 97.8°F | Resp 20 | Ht 66.0 in | Wt 139.5 lb

## 2017-09-14 DIAGNOSIS — Z8673 Personal history of transient ischemic attack (TIA), and cerebral infarction without residual deficits: Secondary | ICD-10-CM

## 2017-09-14 DIAGNOSIS — D509 Iron deficiency anemia, unspecified: Secondary | ICD-10-CM | POA: Insufficient documentation

## 2017-09-14 DIAGNOSIS — I509 Heart failure, unspecified: Secondary | ICD-10-CM | POA: Diagnosis not present

## 2017-09-14 DIAGNOSIS — I4891 Unspecified atrial fibrillation: Secondary | ICD-10-CM | POA: Insufficient documentation

## 2017-09-14 DIAGNOSIS — K219 Gastro-esophageal reflux disease without esophagitis: Secondary | ICD-10-CM

## 2017-09-14 DIAGNOSIS — M069 Rheumatoid arthritis, unspecified: Secondary | ICD-10-CM | POA: Diagnosis not present

## 2017-09-14 DIAGNOSIS — Z87891 Personal history of nicotine dependence: Secondary | ICD-10-CM

## 2017-09-14 DIAGNOSIS — Z7901 Long term (current) use of anticoagulants: Secondary | ICD-10-CM | POA: Insufficient documentation

## 2017-09-14 DIAGNOSIS — C61 Malignant neoplasm of prostate: Secondary | ICD-10-CM | POA: Diagnosis not present

## 2017-09-14 DIAGNOSIS — E785 Hyperlipidemia, unspecified: Secondary | ICD-10-CM | POA: Insufficient documentation

## 2017-09-14 DIAGNOSIS — I10 Essential (primary) hypertension: Secondary | ICD-10-CM | POA: Insufficient documentation

## 2017-09-14 DIAGNOSIS — D471 Chronic myeloproliferative disease: Secondary | ICD-10-CM

## 2017-09-14 DIAGNOSIS — Z79899 Other long term (current) drug therapy: Secondary | ICD-10-CM | POA: Insufficient documentation

## 2017-09-14 DIAGNOSIS — D72829 Elevated white blood cell count, unspecified: Secondary | ICD-10-CM | POA: Diagnosis not present

## 2017-09-14 DIAGNOSIS — J449 Chronic obstructive pulmonary disease, unspecified: Secondary | ICD-10-CM | POA: Diagnosis not present

## 2017-09-14 LAB — CBC WITH DIFFERENTIAL/PLATELET
Basophils Absolute: 0.2 10*3/uL — ABNORMAL HIGH (ref 0–0.1)
Basophils Relative: 1 %
Eosinophils Absolute: 0.2 10*3/uL (ref 0–0.7)
Eosinophils Relative: 1 %
HCT: 30 % — ABNORMAL LOW (ref 40.0–52.0)
Hemoglobin: 9.6 g/dL — ABNORMAL LOW (ref 13.0–18.0)
Lymphocytes Relative: 7 %
Lymphs Abs: 1.2 10*3/uL (ref 1.0–3.6)
MCH: 31.9 pg (ref 26.0–34.0)
MCHC: 32.1 g/dL (ref 32.0–36.0)
MCV: 99.5 fL (ref 80.0–100.0)
Monocytes Absolute: 2.5 10*3/uL — ABNORMAL HIGH (ref 0.2–1.0)
Monocytes Relative: 14 %
Neutro Abs: 13.7 10*3/uL — ABNORMAL HIGH (ref 1.4–6.5)
Neutrophils Relative %: 77 %
Platelets: 238 10*3/uL (ref 150–440)
RBC: 3.01 MIL/uL — ABNORMAL LOW (ref 4.40–5.90)
RDW: 19.1 % — ABNORMAL HIGH (ref 11.5–14.5)
WBC: 17.8 10*3/uL — ABNORMAL HIGH (ref 3.8–10.6)

## 2017-09-14 LAB — COMPREHENSIVE METABOLIC PANEL
ALT: 10 U/L — ABNORMAL LOW (ref 17–63)
AST: 20 U/L (ref 15–41)
Albumin: 4.1 g/dL (ref 3.5–5.0)
Alkaline Phosphatase: 100 U/L (ref 38–126)
Anion gap: 9 (ref 5–15)
BUN: 22 mg/dL — ABNORMAL HIGH (ref 6–20)
CO2: 24 mmol/L (ref 22–32)
Calcium: 8.8 mg/dL — ABNORMAL LOW (ref 8.9–10.3)
Chloride: 104 mmol/L (ref 101–111)
Creatinine, Ser: 0.96 mg/dL (ref 0.61–1.24)
GFR calc Af Amer: >60 mL/min (ref >60)
GFR calc non Af Amer: >60 mL/min (ref >60)
Glucose, Bld: 96 mg/dL (ref 65–99)
Potassium: 3.7 mmol/L (ref 3.5–5.1)
Sodium: 137 mmol/L (ref 135–145)
Total Bilirubin: 0.6 mg/dL (ref 0.3–1.2)
Total Protein: 7.6 g/dL (ref 6.5–8.1)

## 2017-09-14 LAB — LACTATE DEHYDROGENASE: LDH: 150 U/L (ref 98–192)

## 2017-09-14 LAB — PSA: Prostatic Specific Antigen: <0.01 ng/mL (ref 0.00–4.00)

## 2017-09-14 MED ORDER — CITALOPRAM HYDROBROMIDE 20 MG PO TABS: 20.0000 mg | ORAL_TABLET | Freq: Every day | ORAL | 6 refills | Status: DC

## 2017-09-14 NOTE — Progress Notes (Signed)
[facility] OFFICE PROGRESS NOTE  Patient Care Team: Elby Beck, FNP as PCP - General (Nurse Practitioner) Allyne Gee, MD (Internal Medicine)   SUMMARY OF HEMATOLOGIC/ONCOLOGIC HISTORY:  # Leucocytosis- 20-30K [Dr.Pandit-BMBx 2013-No definitive features of a Myeloproliferative neoplasia including CMML, normal cytogenetics (32 XY. Normocellular to focally mildly hypocellular marrow for age (30-50%) with trilineage hematopoiesis, no overt monocytosis ; Flow study unremarkable. Peripheral blood flow- suggestive of chronic myelomonocytic leukemia]; JAK2/Bcr-Abl-NEG.2013- [place]- No hepato-splenomegaly.   # Prostate cancer [early stage] f/u Dr.Tennenbaum [place]- on surveillance  # IDA- ? Etiology.  # RA on MXT; prednisone 5mg  discon;    INTERVAL HISTORY:  A very pleasant 83 -year-old male patient with a prior history of long-standing leukocytosis and mild anemia is here for follow-up.  He is accompanied by his daughter.  He has not had any hospitalizations recently.  Today he feels good. Denies any significant fatigue. His appetite is good. No weight loss. No night sweats no fevers. Denies any chest pain shortness of breath or cough.  He denies any swelling in the legs.  REVIEW OF SYSTEMS:  A complete 10 point review of system is done which is negative except mentioned above/history of present illness.   PAST MEDICAL HISTORY :  Past Medical History:  Diagnosis Date  . Arthritis   . Atrial fibrillation ([place])   . CHF (congestive heart failure) ([place])   . COPD (chronic obstructive pulmonary disease) ([place])   . Dysrhythmia   . GERD (gastroesophageal reflux disease)   . Hyperlipemia   . Hypertension   . Leukocytosis 01/14/2016  . Prostate cancer ([place])   . Stroke [facility])     PAST SURGICAL HISTORY :   Past Surgical History:  Procedure Laterality Date  . CHOLECYSTECTOMY    . COLONOSCOPY WITH PROPOFOL N/A 04/04/2015   Procedure: COLONOSCOPY WITH PROPOFOL;  Surgeon:  Manya Silvas, MD;  Location: [facility] ENDOSCOPY;  Service: Endoscopy;  Laterality: N/A;  . ESOPHAGOGASTRODUODENOSCOPY N/A 04/04/2015   Procedure: ESOPHAGOGASTRODUODENOSCOPY (EGD);  Surgeon: Manya Silvas, MD;  Location: [facility] ENDOSCOPY;  Service: Endoscopy;  Laterality: N/A;  . EYE SURGERY      FAMILY HISTORY :   Family History  Problem Relation Age of Onset  . Hypertension Other     SOCIAL HISTORY:   Social History   Tobacco Use  . Smoking status: Former Smoker    Years: 20.00    Types: Cigarettes  . Smokeless tobacco: Never Used  Substance Use Topics  . Alcohol use: Yes    Alcohol/week: 0.6 oz    Types: 1 Cans of beer per week    Comment: Wine every once in a while  . Drug use: No    ALLERGIES:  has No Known Allergies.  MEDICATIONS:  Current Outpatient Medications  Medication Sig Dispense Refill  . acetaminophen (TYLENOL) 325 MG tablet Take 2 tablets (650 mg total) by mouth every 6 (six) hours as needed for mild pain (or Fever >/= 101).    Marland Kitchen albuterol (PROVENTIL HFA;VENTOLIN HFA) 108 (90 Base) MCG/ACT inhaler Inhale 2 puffs into the lungs every 6 (six) hours as needed for wheezing or shortness of breath. 8 g 0  . Besifloxacin HCl (BESIVANCE) 0.6 % SUSP Apply to eye.    Marland Kitchen BREO ELLIPTA 100-25 MCG/INH AEPB Inhale 1 puff into the lungs every morning.    . digoxin (LANOXIN) 0.125 MG tablet Take 1 tablet (0.125 mg total) every morning by mouth. 90 tablet 1  . ferrous sulfate 325 (  65 FE) MG tablet Take 325 mg by mouth every morning. Reported on 1/61/0960    . folic acid (FOLVITE) 1 MG tablet Take 1 mg by mouth daily.    . furosemide (LASIX) 20 MG tablet Take 1 tablet (20 mg total) every other day by mouth. In the morning 45 tablet 1  . methotrexate (RHEUMATREX) 2.5 MG tablet TAKE 4 TABLETS (15 MG TOTAL) BY MOUTH EVERY 7 (SEVEN) DAYS.  4  . montelukast (SINGULAIR) 10 MG tablet Take 10 mg by mouth daily.     . Multiple Vitamins-Minerals (PRESERVISION AREDS PO) Take 1 tablet by  mouth 2 (two) times daily.     Marland Kitchen warfarin (COUMADIN) 5 MG tablet Take 1 tablet (5 mg total) by mouth one time only at 6 PM. 2 tablet 0  . citalopram (CELEXA) 20 MG tablet Take 1 tablet (20 mg total) by mouth daily. 30 tablet 6   No current facility-administered medications for this visit.     PHYSICAL EXAMINATION: ECOG PERFORMANCE STATUS: \  BP 122/80 (Patient Position: Sitting)   Pulse 71   Temp 97.8 F (36.6 C) (Tympanic)   Resp 20   Ht 5\' 6"  (1.676 m)   Wt 139 lb 8 oz (63.3 kg)   BMI 22.52 kg/m   Filed Weights   09/14/17 1534  Weight: 139 lb 8 oz (63.3 kg)    GENERAL: Well-nourished well-developed; Alert, no distress and comfortable.  Accompanied by his daughter EYES: no pallor or icterus OROPHARYNX: no thrush or ulceration; dentures.  NECK: supple, no masses felt LYMPH:  no palpable lymphadenopathy in the cervical, axillary or inguinal regions LUNGS: clear to auscultation and  No wheeze or crackles HEART/CVS:irregular rate & rhythm and no murmurs; No lower extremity edema ABDOMEN:abdomen soft, non-tender and normal bowel sounds Musculoskeletal:no cyanosis of digits and no clubbing  PSYCH: alert & oriented x 3 with fluent speech NEURO: no focal motor/sensory deficits SKIN:  Big bruise on his left arm. LABORATORY DATA:  I have reviewed the data as listed    Component Value Date/Time   NA 137 09/14/2017 1513   NA 140 01/22/2015 0345   K 3.7 09/14/2017 1513   K 3.7 01/22/2015 0345   CL 104 09/14/2017 1513   CL 106 01/22/2015 0345   CO2 24 09/14/2017 1513   CO2 25 01/22/2015 0345   GLUCOSE 96 09/14/2017 1513   GLUCOSE 89 01/22/2015 0345   BUN 22 (H) 09/14/2017 1513   BUN 16 01/22/2015 0345   CREATININE 0.96 09/14/2017 1513   CREATININE 0.88 01/22/2015 0345   CALCIUM 8.8 (L) 09/14/2017 1513   CALCIUM 7.7 (L) 01/22/2015 0345   PROT 7.6 09/14/2017 1513   PROT 6.8 01/21/2015 1946   ALBUMIN 4.1 09/14/2017 1513   ALBUMIN 3.3 (L) 01/21/2015 1946   AST 20  09/14/2017 1513   AST 20 01/21/2015 1946   ALT 10 (L) 09/14/2017 1513   ALT 6 (L) 01/21/2015 1946   ALKPHOS 100 09/14/2017 1513   ALKPHOS 86 01/21/2015 1946   BILITOT 0.6 09/14/2017 1513   BILITOT 0.9 01/21/2015 1946   GFRNONAA >60 09/14/2017 1513   GFRNONAA >60 01/22/2015 0345   GFRAA >60 09/14/2017 1513   GFRAA >60 01/22/2015 0345    No results found for: SPEP, UPEP  Lab Results  Component Value Date   WBC 17.8 (H) 09/14/2017   NEUTROABS 13.7 (H) 09/14/2017   HGB 9.6 (L) 09/14/2017   HCT 30.0 (L) 09/14/2017   MCV 99.5 09/14/2017   PLT  238 09/14/2017      Chemistry      Component Value Date/Time   NA 137 09/14/2017 1513   NA 140 01/22/2015 0345   K 3.7 09/14/2017 1513   K 3.7 01/22/2015 0345   CL 104 09/14/2017 1513   CL 106 01/22/2015 0345   CO2 24 09/14/2017 1513   CO2 25 01/22/2015 0345   BUN 22 (H) 09/14/2017 1513   BUN 16 01/22/2015 0345   CREATININE 0.96 09/14/2017 1513   CREATININE 0.88 01/22/2015 0345      Component Value Date/Time   CALCIUM 8.8 (L) 09/14/2017 1513   CALCIUM 7.7 (L) 01/22/2015 0345   ALKPHOS 100 09/14/2017 1513   ALKPHOS 86 01/21/2015 1946   AST 20 09/14/2017 1513   AST 20 01/21/2015 1946   ALT 10 (L) 09/14/2017 1513   ALT 6 (L) 01/21/2015 1946   BILITOT 0.6 09/14/2017 1513   BILITOT 0.9 01/21/2015 1946         ASSESSMENT & PLAN:   Myeloproliferative neoplasm ([place]) # Leukocytosis predominant neutrophilia- again unclear etiology- 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 Today white count is 17,000; hemoglobin around 9-10;  platelets normal. Patient is asymptomatic. Continue surveillance at this time.  # Anemia hemoglobin 9-10likely secondary to above primary bone marrow process. Iron studies -no iron deficiency anemia. Stable. Monitor for now.   # /A.fib/CHF controlled- compensated; on Coumadin.  # Prostate cancer early stage-await PSA.  I would not recommend any shots-antiandrogen or any further workup at this  time-given his age/comorbidities.  He agrees.  # Follow-up in 4 months with labs. The above plan of care was discussed with the patient and his daughter in detail.   Cammie Sickle, MD 09/23/2017 8:55 AM

## 2017-09-14 NOTE — Assessment & Plan Note (Addendum)
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 Today white count is 17,000; hemoglobin around 9-10;  platelets normal. Patient is asymptomatic. Continue surveillance at this time.  # Anemia hemoglobin 9-10likely secondary to above primary bone marrow process. Iron studies -no iron deficiency anemia. Stable. Monitor for now.   # /A.fib/CHF controlled- compensated; on Coumadin.  # Prostate cancer early stage-await PSA.  I would not recommend any shots-antiandrogen or any further workup at this time-given his age/comorbidities.  He agrees.  # Follow-up in 4 months with labs. The above plan of care was discussed with the patient and his daughter in detail.

## 2017-09-25 DIAGNOSIS — J189 Pneumonia, unspecified organism: Secondary | ICD-10-CM | POA: Diagnosis not present

## 2017-09-29 ENCOUNTER — Ambulatory Visit: Payer: Medicare Other | Attending: Pulmonary Disease

## 2017-09-29 DIAGNOSIS — J439 Emphysema, unspecified: Secondary | ICD-10-CM | POA: Insufficient documentation

## 2017-09-29 DIAGNOSIS — J8410 Pulmonary fibrosis, unspecified: Secondary | ICD-10-CM | POA: Diagnosis not present

## 2017-09-29 DIAGNOSIS — J42 Unspecified chronic bronchitis: Secondary | ICD-10-CM

## 2017-09-29 MED ORDER — ALBUTEROL SULFATE (2.5 MG/3ML) 0.083% IN NEBU
2.5000 mg | INHALATION_SOLUTION | Freq: Once | RESPIRATORY_TRACT | Status: AC
  Administered 2017-09-29: 2.5 mg via RESPIRATORY_TRACT

## 2017-10-03 ENCOUNTER — Encounter: Payer: Self-pay | Admitting: Pulmonary Disease

## 2017-10-03 ENCOUNTER — Ambulatory Visit (INDEPENDENT_AMBULATORY_CARE_PROVIDER_SITE_OTHER): Payer: Medicare Other | Admitting: Pulmonary Disease

## 2017-10-03 VITALS — BP 94/60 | HR 77 | Ht 66.0 in | Wt 137.0 lb

## 2017-10-03 DIAGNOSIS — J449 Chronic obstructive pulmonary disease, unspecified: Secondary | ICD-10-CM

## 2017-10-03 MED ORDER — BREO ELLIPTA 100-25 MCG/INH IN AEPB: 1.0000 | INHALATION_SPRAY | RESPIRATORY_TRACT | 5 refills | Status: DC

## 2017-10-03 NOTE — Patient Instructions (Signed)
Continue to use Breo inhaler as long as you feel that it is helping your shortness of breath  Follow-up as needed

## 2017-10-03 NOTE — Progress Notes (Signed)
PULMONARY OFFICE FOLLOW-UP NOTE  Requesting MD/Service: Self referred Date of initial consultation: 08/05/17 Reason for consultation: h/o COPD  PT PROFILE: 84 y.o. male former remote smoker previously seen by Dr Humphrey Rolls with RA, prior dx of COPD and pulmonary fibrosis  DATA: Echocardiogram 08/08/15: LVEF 55-60%. Mild AS. RVSP est 55 mmHg CT chest 06/24/16: Mild cardiac enlargement, multi vessel coronary artery calcifications, pleural effusions and interstitial edema suspicious for CHF. Diffuse bronchial wall thickening with emphysema, as above; imaging findings suggestive of underlying COPD. Borderline enlarged right paratracheal lymph nodes identified. Nonspecific in the setting of CHF CXR 06/29/17: lingular opacity PFTs 09/29/17: Moderate obstruction.  FEV1 1.79 L (76% predicted), FEV1/FVC 48%, lung volumes normal, diffusion capacity moderately reduced at 51% predicted  SUBJ: This is a routine reevaluation.  He is here with his daughter.  He continues to deny any significant exertional dyspnea or other respiratory symptoms.  He is here to review the results of his PFTs.. Denies CP, fever, purulent sputum, hemoptysis, LE edema and calf tenderness.  He has been on Breo inhaler but is unable to discern any benefit and doesn't use it consistently.  He remains on methotrexate 10mg  weekly for RA.    Vitals:   10/03/17 1441 10/03/17 1444  BP:  94/60  Pulse:  77  SpO2:  93%  Weight: 62.1 kg (137 lb)   Height: 5\' 6"  (1.676 m)   RA   EXAM:  Gen: WDWN in NAD HEENT: NCAT, sclerae white, oropharynx normal Neck: No LAN, no JVD noted Lungs: full BS, no adventitious sounds Cardiovascular: Reg, no M noted Abdomen: Soft, NT, +BS Ext: no C/C/E Neuro: PERRL, EOMI, motor/sensory grossly intact Skin: No lesions noted   DATA:   BMP Latest Ref Rng & Units 09/14/2017 07/02/2017 07/01/2017  Glucose 65 - 99 mg/dL 96 100(H) 91  BUN 6 - 20 mg/dL 22(H) 15 12  Creatinine 0.61 - 1.24 mg/dL 0.96 1.14 0.97   Sodium 135 - 145 mmol/L 137 139 140  Potassium 3.5 - 5.1 mmol/L 3.7 3.7 3.7  Chloride 101 - 111 mmol/L 104 108 109  CO2 22 - 32 mmol/L 24 26 25   Calcium 8.9 - 10.3 mg/dL 8.8(L) 8.4(L) 8.3(L)    CBC Latest Ref Rng & Units 09/14/2017 07/03/2017 07/02/2017  WBC 3.8 - 10.6 K/uL 17.8(H) 17.5(H) 18.9(H)  Hemoglobin 13.0 - 18.0 g/dL 9.6(L) 9.3(L) 9.1(L)  Hematocrit 40.0 - 52.0 % 30.0(L) 27.8(L) 26.7(L)  Platelets 150 - 440 K/uL 238 217 214    CXR: No new film  IMPRESSION:     ICD-10-CM   1. Moderate COPD (chronic obstructive pulmonary disease) (HCC) J44.9      PLAN:  We discussed the diagnosis of COPD.  I explained that he had done the most important thing by quitting smoking.  There still seems to be some uncertainty regarding whether ICS/L ABA inhaler is of any benefit.  I have instructed that he can continue to experiment with it to decide whether it is helping shortness of breath at all.  I emphasized that bronchodilator therapy or inhaled steroids do not change the natural course of COPD.  As long as he has no symptoms, or as long as his symptoms are not disabling, it is okay for him to remain off of these medications.  Follow-up as needed   Merton Border, MD PCCM service Mobile [phone] Pager [phone] 10/06/2017 11:15 PM

## 2017-10-10 DIAGNOSIS — Z79899 Other long term (current) drug therapy: Secondary | ICD-10-CM | POA: Diagnosis not present

## 2017-10-10 DIAGNOSIS — M0579 Rheumatoid arthritis with rheumatoid factor of multiple sites without organ or systems involvement: Secondary | ICD-10-CM | POA: Diagnosis not present

## 2017-10-10 IMAGING — CR DG CHEST 2V
1 series · 2 of 2 positions shown · non-contrast
Comparison: 04/09/2014

CLINICAL DATA: Shortness of breath for 3 days. Atrial fibrillation.
COPD. Congestive heart failure.

EXAM:
CHEST  2 VIEW

[Series 1: dg chest 2 view · 0.14mm/px · 2 of 2 slices shown]
[im 1/2]
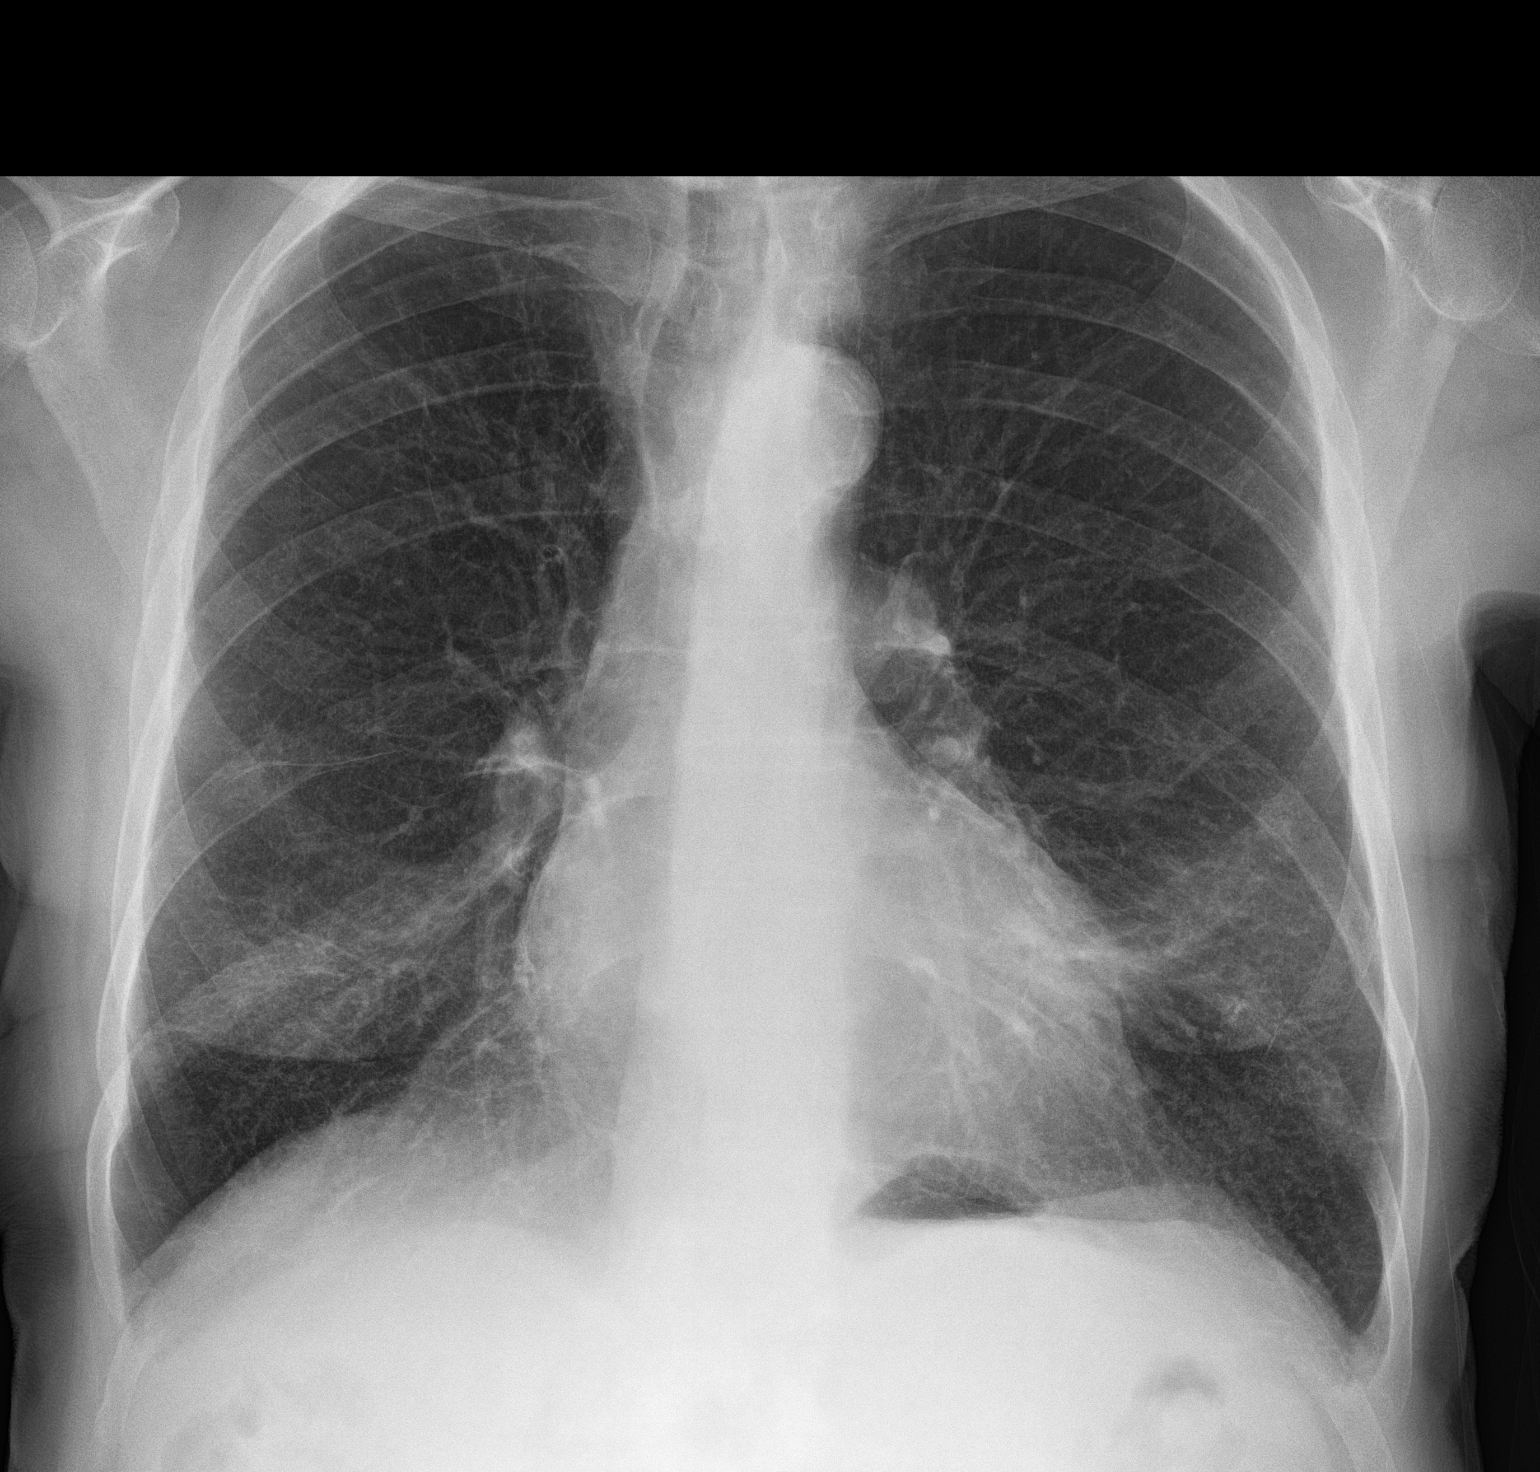
[im 2/2]
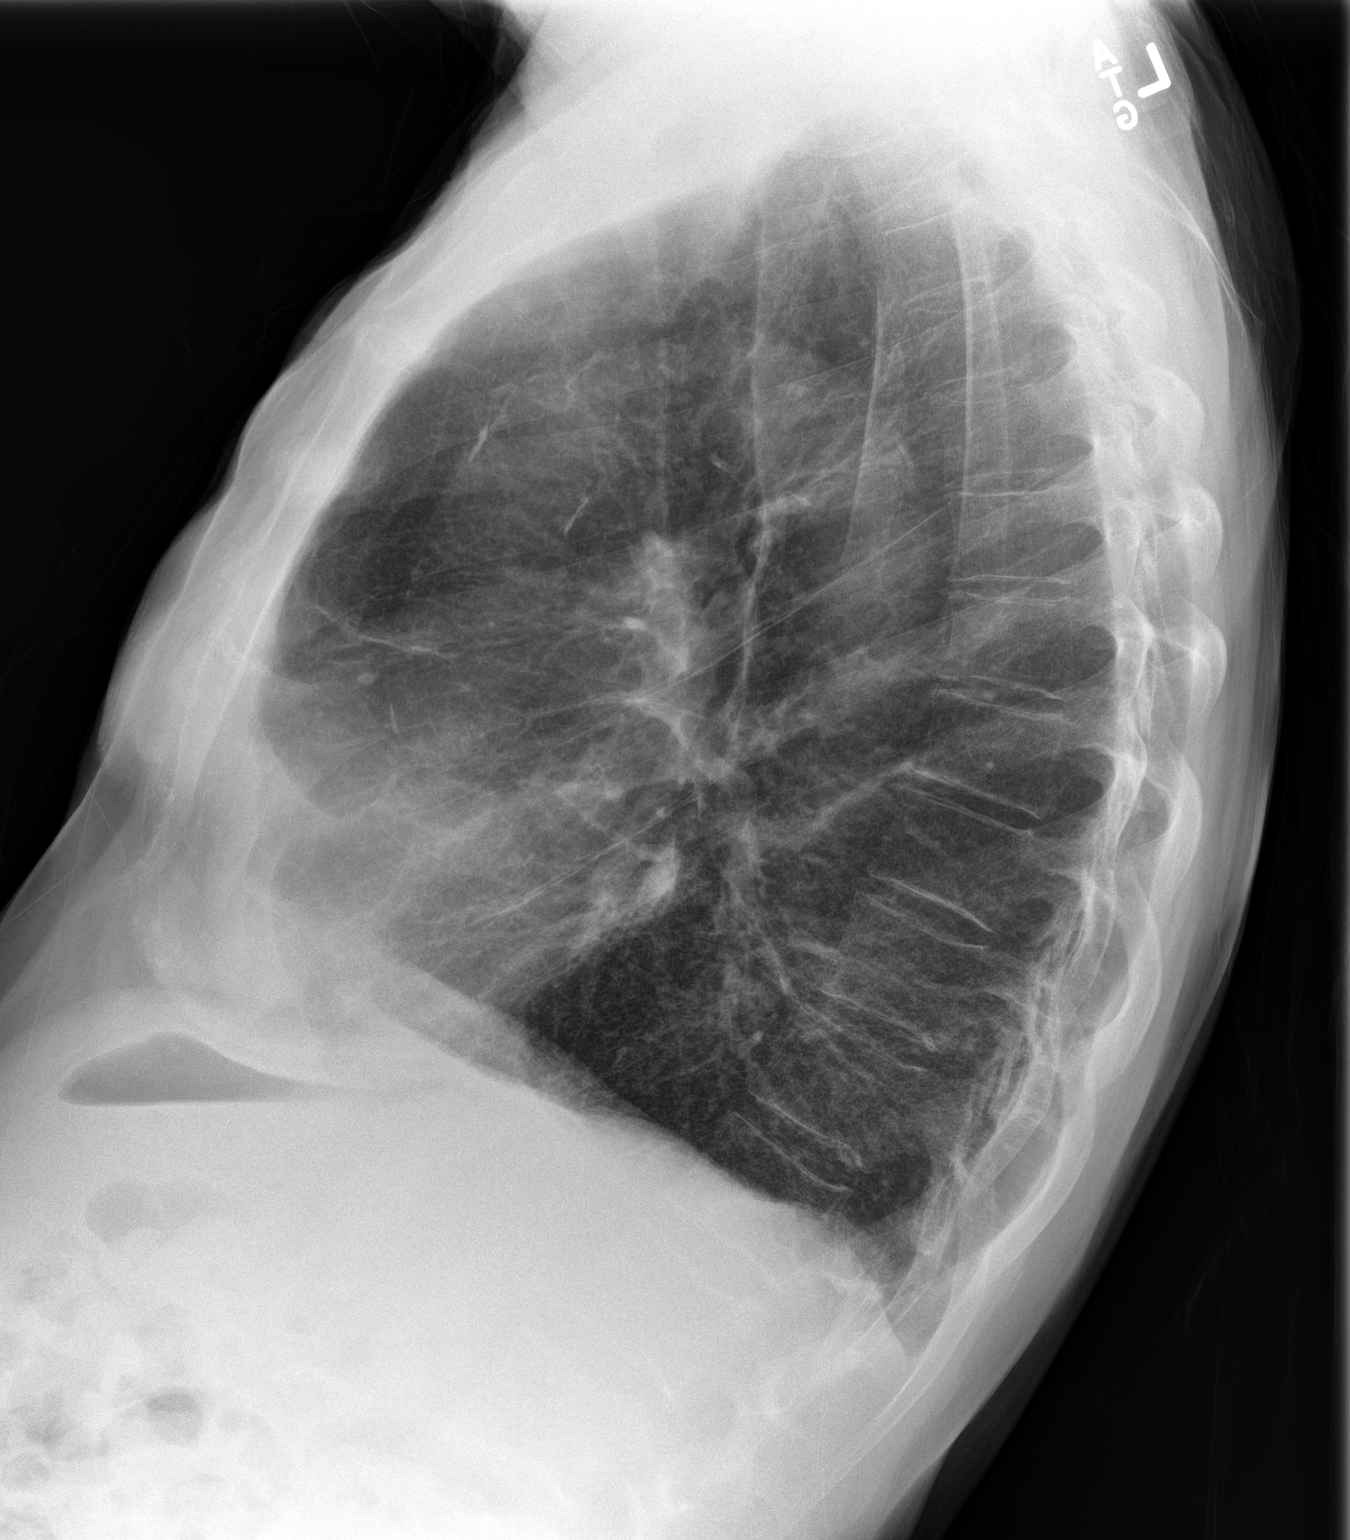

[2 of 2 positions shown; findings below may reference images not displayed]

FINDINGS: The heart size and mediastinal contours are within normal limits.
Pulmonary hyperinflation again seen, consistent with COPD. Bilateral
lower lung scarring again demonstrated. No evidence of pulmonary
infiltrate or edema. No evidence pleural effusion.
IMPRESSION: Stable COPD and bilateral scarring.  No active lung disease.

## 2017-10-25 DIAGNOSIS — Z79899 Other long term (current) drug therapy: Secondary | ICD-10-CM | POA: Diagnosis not present

## 2017-10-25 DIAGNOSIS — M0579 Rheumatoid arthritis with rheumatoid factor of multiple sites without organ or systems involvement: Secondary | ICD-10-CM | POA: Diagnosis not present

## 2017-10-25 DIAGNOSIS — C9190 Lymphoid leukemia, unspecified not having achieved remission: Secondary | ICD-10-CM | POA: Diagnosis not present

## 2017-10-25 DIAGNOSIS — I482 Chronic atrial fibrillation: Secondary | ICD-10-CM | POA: Diagnosis not present

## 2017-10-26 DIAGNOSIS — J189 Pneumonia, unspecified organism: Secondary | ICD-10-CM | POA: Diagnosis not present

## 2017-11-10 ENCOUNTER — Other Ambulatory Visit: Payer: Self-pay | Admitting: *Deleted

## 2017-11-10 NOTE — Telephone Encounter (Signed)
Prescription nit due yet

## 2017-11-16 ENCOUNTER — Encounter (INDEPENDENT_AMBULATORY_CARE_PROVIDER_SITE_OTHER): Payer: Medicare Other | Admitting: Ophthalmology

## 2017-11-24 DIAGNOSIS — J189 Pneumonia, unspecified organism: Secondary | ICD-10-CM | POA: Diagnosis not present

## 2017-11-28 ENCOUNTER — Telehealth: Payer: Self-pay | Admitting: *Deleted

## 2017-11-28 NOTE — Telephone Encounter (Signed)
Leslie informed 

## 2017-11-28 NOTE — Telephone Encounter (Signed)
Patient has been on Celexa for 4-6 weeks and there is no improvement in his mood. Family asking if this can be changed to something else. He is not sleeping well and is having bad dreams about dead people. Please advise

## 2017-11-28 NOTE — Telephone Encounter (Signed)
D/c celexa and have patient contact pcp for new recommendations per Dr. Jacinto Reap.

## 2017-11-29 ENCOUNTER — Telehealth: Payer: Self-pay | Admitting: Family Medicine

## 2017-11-29 NOTE — Telephone Encounter (Signed)
Copied from [place]. Topic: Quick Communication - See Telephone Encounter >> Nov 29, 2017  4:27 PM Margot Ables wrote: CRM for notification. See Telephone encounter for: 11/29/17.  Oncology started pt on antidepressent (Celexa 20mg  1/day) and it is causing vivid dreams that are almost like hallucinations. She wants to change the pts med. Oncology Dr. Rogue Bussing would prefer that PCP adjust/monitor antidepressant. Please f/u with Magda Paganini directly [phone].

## 2017-11-29 NOTE — Telephone Encounter (Signed)
Please call patient's daughter and see if he is able to come in for a visit to discuss his symptoms and medication.

## 2017-11-30 NOTE — Telephone Encounter (Signed)
Spoke to pt's daughter [name] and scheduled f/u for 3/8

## 2017-12-02 ENCOUNTER — Ambulatory Visit: Payer: Medicare Other | Admitting: Family Medicine

## 2017-12-19 DIAGNOSIS — J449 Chronic obstructive pulmonary disease, unspecified: Secondary | ICD-10-CM | POA: Diagnosis not present

## 2017-12-19 DIAGNOSIS — C9190 Lymphoid leukemia, unspecified not having achieved remission: Secondary | ICD-10-CM | POA: Diagnosis not present

## 2017-12-19 DIAGNOSIS — I482 Chronic atrial fibrillation: Secondary | ICD-10-CM | POA: Diagnosis not present

## 2017-12-24 DIAGNOSIS — J189 Pneumonia, unspecified organism: Secondary | ICD-10-CM | POA: Diagnosis not present

## 2017-12-26 ENCOUNTER — Emergency Department: Payer: Medicare Other

## 2017-12-26 ENCOUNTER — Inpatient Hospital Stay
Admission: EM | Admit: 2017-12-26 | Discharge: 2017-12-29 | DRG: 871 | Disposition: A | Discharge status: Home / Self Care | Payer: Medicare Other | Attending: Internal Medicine | Admitting: Internal Medicine

## 2017-12-26 ENCOUNTER — Other Ambulatory Visit: Payer: Self-pay

## 2017-12-26 DIAGNOSIS — A419 Sepsis, unspecified organism: Secondary | ICD-10-CM | POA: Diagnosis present

## 2017-12-26 DIAGNOSIS — D6832 Hemorrhagic disorder due to extrinsic circulating anticoagulants: Secondary | ICD-10-CM | POA: Diagnosis present

## 2017-12-26 DIAGNOSIS — Z7901 Long term (current) use of anticoagulants: Secondary | ICD-10-CM | POA: Diagnosis not present

## 2017-12-26 DIAGNOSIS — I509 Heart failure, unspecified: Secondary | ICD-10-CM | POA: Diagnosis not present

## 2017-12-26 DIAGNOSIS — J9601 Acute respiratory failure with hypoxia: Secondary | ICD-10-CM | POA: Diagnosis not present

## 2017-12-26 DIAGNOSIS — T45515A Adverse effect of anticoagulants, initial encounter: Secondary | ICD-10-CM | POA: Diagnosis not present

## 2017-12-26 DIAGNOSIS — K219 Gastro-esophageal reflux disease without esophagitis: Secondary | ICD-10-CM | POA: Diagnosis not present

## 2017-12-26 DIAGNOSIS — I110 Hypertensive heart disease with heart failure: Secondary | ICD-10-CM | POA: Diagnosis present

## 2017-12-26 DIAGNOSIS — J440 Chronic obstructive pulmonary disease with acute lower respiratory infection: Secondary | ICD-10-CM | POA: Diagnosis present

## 2017-12-26 DIAGNOSIS — Z8673 Personal history of transient ischemic attack (TIA), and cerebral infarction without residual deficits: Secondary | ICD-10-CM | POA: Diagnosis not present

## 2017-12-26 DIAGNOSIS — Z79899 Other long term (current) drug therapy: Secondary | ICD-10-CM

## 2017-12-26 DIAGNOSIS — J189 Pneumonia, unspecified organism: Secondary | ICD-10-CM | POA: Diagnosis present

## 2017-12-26 DIAGNOSIS — R509 Fever, unspecified: Secondary | ICD-10-CM | POA: Diagnosis not present

## 2017-12-26 DIAGNOSIS — J441 Chronic obstructive pulmonary disease with (acute) exacerbation: Secondary | ICD-10-CM | POA: Diagnosis present

## 2017-12-26 DIAGNOSIS — E785 Hyperlipidemia, unspecified: Secondary | ICD-10-CM | POA: Diagnosis present

## 2017-12-26 DIAGNOSIS — I480 Paroxysmal atrial fibrillation: Secondary | ICD-10-CM | POA: Diagnosis present

## 2017-12-26 DIAGNOSIS — Z87891 Personal history of nicotine dependence: Secondary | ICD-10-CM

## 2017-12-26 DIAGNOSIS — D649 Anemia, unspecified: Secondary | ICD-10-CM | POA: Diagnosis not present

## 2017-12-26 DIAGNOSIS — Z66 Do not resuscitate: Secondary | ICD-10-CM | POA: Diagnosis not present

## 2017-12-26 DIAGNOSIS — Z8546 Personal history of malignant neoplasm of prostate: Secondary | ICD-10-CM

## 2017-12-26 DIAGNOSIS — I10 Essential (primary) hypertension: Secondary | ICD-10-CM | POA: Diagnosis not present

## 2017-12-26 DIAGNOSIS — R531 Weakness: Secondary | ICD-10-CM | POA: Diagnosis not present

## 2017-12-26 MED ORDER — VANCOMYCIN HCL IN DEXTROSE 1-5 GM/200ML-% IV SOLN
1000.0000 mg | Freq: Once | INTRAVENOUS | Status: AC
  Administered 2017-12-27: 1000 mg via INTRAVENOUS
  Filled 2017-12-26: qty 200

## 2017-12-26 MED ORDER — PIPERACILLIN-TAZOBACTAM 3.375 G IVPB 30 MIN
3.3750 g | Freq: Once | INTRAVENOUS | Status: AC
  Administered 2017-12-27: 3.375 g via INTRAVENOUS
  Filled 2017-12-26: qty 50

## 2017-12-26 NOTE — ED Provider Notes (Addendum)
[facility] Emergency Department Provider Note   Time seen: 11:45 PM  I have reviewed the triage vital signs and the nursing notes.   HISTORY  Chief Complaint Fever and Weakness    HPI Thomas Townsend is a 84 y.o. male with below list of chronic medical conditions presents to the emergency department with 1 day history of fever generalized weakness, confusion and cough.   Past Medical History:  Diagnosis Date  . Arthritis   . Atrial fibrillation ([place])   . CHF (congestive heart failure) ([place])   . COPD (chronic obstructive pulmonary disease) ([place])   . Dysrhythmia   . GERD (gastroesophageal reflux disease)   . Hyperlipemia   . Hypertension   . Leukocytosis 01/14/2016  . Prostate cancer ([place])   . Stroke [facility])     Patient Active Problem List   Diagnosis Date Noted  . Altered mental status   . Palliative care encounter   . HCAP (healthcare-associated pneumonia) 06/29/2017  . Acute metabolic encephalopathy 37/06/6268  . Aspiration pneumonia ([place]) 03/22/2017  . Right upper lobe pneumonia ([place]) 10/05/2016  . Chronic neutrophilia 08/26/2016  . Iron deficiency anemia due to chronic blood loss 08/26/2016  . Myeloproliferative neoplasm ([place]) 06/25/2016  . Anemia due to other cause 06/25/2016  . Diarrhea 02/26/2016  . Leukocytosis 01/14/2016  . Septic shock ([place]) 08/11/2015  . Acidosis, lactic 08/11/2015  . Acute respiratory failure with hypoxia ([place]) 08/11/2015  . Encephalopathy, metabolic 48/54/6270  . Pneumonia 08/11/2015  . Pulmonary hypertension ([place]) 08/11/2015  . Mild aortic stenosis 08/11/2015  . Generalized weakness 08/11/2015  . Anemia of chronic disease 08/11/2015  . Sepsis ([place]) 08/06/2015  . Atrial fibrillation with rapid ventricular response ([place]) 08/06/2015  . CAP (community acquired pneumonia) 08/06/2015  . Rheumatoid arthritis involving multiple joints ([place]) 01/28/2015  . Arthritis or polyarthritis, rheumatoid ([place]) 01/28/2015    . A-fib ([place]) 03/04/2014  . CAFL (chronic airflow limitation) ([place]) 03/04/2014  . Acid reflux 03/04/2014  . Chronic diastolic CHF (congestive heart failure) ([place]) 03/04/2014  . Atrial fibrillation ([place]) 03/04/2014  . COPD (chronic obstructive pulmonary disease) ([place]) 03/04/2014    Past Surgical History:  Procedure Laterality Date  . CHOLECYSTECTOMY    . COLONOSCOPY WITH PROPOFOL N/A 04/04/2015   Procedure: COLONOSCOPY WITH PROPOFOL;  Surgeon: Manya Silvas, MD;  Location: [facility] ENDOSCOPY;  Service: Endoscopy;  Laterality: N/A;  . ESOPHAGOGASTRODUODENOSCOPY N/A 04/04/2015   Procedure: ESOPHAGOGASTRODUODENOSCOPY (EGD);  Surgeon: Manya Silvas, MD;  Location: [facility] ENDOSCOPY;  Service: Endoscopy;  Laterality: N/A;  . EYE SURGERY      Prior to Admission medications   Medication Sig Start Date End Date Taking? Authorizing Provider  acetaminophen (TYLENOL) 325 MG tablet Take 2 tablets (650 mg total) by mouth every 6 (six) hours as needed for mild pain (or Fever >/= 101). 07/03/17   Gouru, Illene Silver, MD  albuterol (PROVENTIL HFA;VENTOLIN HFA) 108 (90 Base) MCG/ACT inhaler Inhale 2 puffs into the lungs every 6 (six) hours as needed for wheezing or shortness of breath. 10/07/16   Hillary Bow, MD  Besifloxacin HCl (BESIVANCE) 0.6 % SUSP Apply to eye.    [provider]  BREO ELLIPTA 100-25 MCG/INH AEPB Inhale 1 puff into the lungs every morning. 10/03/17   Wilhelmina Mcardle, MD  digoxin (LANOXIN) 0.125 MG tablet Take 1 tablet (0.125 mg total) every morning by mouth. 08/15/17   Elby Beck, FNP  ferrous sulfate 325 (65 FE) MG tablet Take 325 mg by mouth every  morning. Reported on 02/25/2016    [provider]  folic acid (FOLVITE) 1 MG tablet Take 1 mg by mouth daily.    [provider]  furosemide (LASIX) 20 MG tablet Take 1 tablet (20 mg total) every other day by mouth. In the morning 08/15/17   Elby Beck, FNP  methotrexate (RHEUMATREX) 2.5 MG tablet TAKE 4  TABLETS (15 MG TOTAL) BY MOUTH EVERY 7 (SEVEN) DAYS. 12/24/15   [provider]  montelukast (SINGULAIR) 10 MG tablet Take 10 mg by mouth daily.     [provider]  Multiple Vitamins-Minerals (PRESERVISION AREDS PO) Take 1 tablet by mouth 2 (two) times daily.     [provider]  warfarin (COUMADIN) 5 MG tablet Take 1 tablet (5 mg total) by mouth one time only at 6 PM. 07/03/17 09/15/67  [name], MD    Allergies No known drug allergies  Family History  Problem Relation Age of Onset  . Hypertension Other     Social History Social History   Tobacco Use  . Smoking status: Former Smoker    Years: 20.00    Types: Cigarettes  . Smokeless tobacco: Never Used  Substance Use Topics  . Alcohol use: Yes    Alcohol/week: 0.6 oz    Types: 1 Cans of beer per week    Comment: Wine every once in a while  . Drug use: No    Review of Systems Constitutional: Positive for fever/chills Eyes: No visual changes. ENT: No sore throat. Cardiovascular: Denies chest pain. Respiratory: Positive for cough. Gastrointestinal: No abdominal pain.  No nausea, no vomiting.  No diarrhea.  No constipation. Genitourinary: Negative for dysuria. Musculoskeletal: Negative for neck pain.  Negative for back pain. Integumentary: Negative for rash. Neurological: Negative for headaches, focal weakness or numbness.   ____________________________________________   PHYSICAL EXAM:  VITAL SIGNS: ED Triage Vitals  Enc Vitals Group     BP      Pulse      Resp      Temp      Temp src      SpO2      Weight      Height      Head Circumference      Peak Flow      Pain Score      Pain Loc      Pain Edu?      Excl. in [place]?     Constitutional: Alert and oriented.  Actively coughing.   Eyes: Conjunctivae are normal.  Head: Atraumatic. Nose: No congestion/rhinnorhea. Mouth/Throat: Mucous membranes are moist.  Oropharynx non-erythematous. Neck: No stridor.  No meningeal signs.    Cardiovascular: Normal rate, regular rhythm. Good peripheral circulation. Grossly normal heart sounds. Respiratory: Normal respiratory effort.  No retractions.  Diffuse rhonchi Gastrointestinal: Soft and nontender. No distention.  Musculoskeletal: No lower extremity tenderness nor edema. No gross deformities of extremities. Neurologic:  Normal speech and language. No gross focal neurologic deficits are appreciated.  Skin:  Skin is warm, dry and intact. No rash noted. Psychiatric: Mood and affect are normal. Speech and behavior are normal.  ____________________________________________   LABS (all labs ordered are listed, but only abnormal results are displayed)  Labs Reviewed  CULTURE, BLOOD (ROUTINE X 2)  CULTURE, BLOOD (ROUTINE X 2)  COMPREHENSIVE METABOLIC PANEL  CBC WITH DIFFERENTIAL/PLATELET  LACTIC ACID, PLASMA  LACTIC ACID, PLASMA  URINALYSIS, COMPLETE (UACMP) WITH MICROSCOPIC  INFLUENZA PANEL BY PCR (TYPE A & B)  ____________________________________________  EKG ED ECG REPORT I, [place] N BROWN, the attending physician, personally viewed and interpreted this ECG.   Date: 12/26/2017  EKG Time: 11:56 PM  Rate: 83  Rhythm: Atrial fibrillation  Axis: Normal  Intervals: Normal  ST&T Change: None  ____________________________________________  RADIOLOGY I, [place]ett N BROWN, personally viewed and evaluated these images (plain radiographs) as part of my medical decision making, as well as reviewing the written report by the radiologist.  ED MD interpretation: Concern for bibasilar pneumonia.  Official radiology report(s): Dg Chest Portable 1 View  Result Date: 12/27/2017 CLINICAL DATA:  Generalized weakness, fever, chills and confusion tonight. History of COPD. EXAM: PORTABLE CHEST 1 VIEW COMPARISON:  Chest radiograph June 29, 2017 FINDINGS: Cardiac silhouette is mildly enlarged and unchanged. Calcified aortic knob. Mild chronic interstitial changes confluent in the  lung bases similar to prior examination. No pleural effusion. No pneumothorax. Osteopenia. IMPRESSION: Similar interstitial prominence confluent in lung bases concerning for recurrent pneumonia with probable underlying fibrosis/COPD. Aortic Atherosclerosis (ICD10-I70.0). Electronically Signed   By: Elon Alas M.D.   On: 12/27/2017 00:13    ____________________________________________   PROCEDURES  Critical Care performed:   .Critical Care Performed by: Gregor Hams, MD Authorized by: Gregor Hams, MD   Critical care provider statement:    Critical care time (minutes):  30   Critical care start time:  12/27/2017 12:00 AM   Critical care end time:  12/27/2017 12:30 AM   Critical care time was exclusive of:  Separately billable procedures and treating other patients and teaching time   Critical care was necessary to treat or prevent imminent or life-threatening deterioration of the following conditions:  Sepsis   Critical care was time spent personally by me on the following activities:  Development of treatment plan with patient or surrogate, discussions with consultants, evaluation of patient's response to treatment, examination of patient, obtaining history from patient or surrogate, ordering and performing treatments and interventions, ordering and review of laboratory studies, ordering and review of radiographic studies, pulse oximetry, re-evaluation of patient's condition and review of old charts   I assumed direction of critical care for this patient from another provider in my specialty: no       ____________________________________________   INITIAL IMPRESSION / ASSESSMENT AND PLAN / ED COURSE  As part of my medical decision making, I reviewed the following data within the electronic MEDICAL RECORD NUMBER   84 year old male presented with above-stated history and physical exam secondary to sepsis.  Patient presents to the emergency department febrile tachypneic hypoxic.   Chest x-ray consistent with multilobar pneumonia.  Abscess protocol was initiated patient received appropriate IV antibiotic therapy.  Patient is not hypotensive and as such 30 mL's per kilo was not administered.  Patient discussed with Dr. Bjorn Loser for hospital admission. ____________________________________________  FINAL CLINICAL IMPRESSION(S) / ED DIAGNOSES  Final diagnoses:  Sepsis, due to unspecified organism ([place])  Community acquired pneumonia, unspecified laterality     MEDICATIONS GIVEN DURING THIS VISIT:  Medications  piperacillin-tazobactam (ZOSYN) IVPB 3.375 g (3.375 g Intravenous New Bag/Given 12/27/17 0007)  vancomycin (VANCOCIN) IVPB 1000 mg/200 mL premix (1,000 mg Intravenous New Bag/Given 12/27/17 0010)     ED Discharge Orders    None       Note:  This document was prepared using Dragon voice recognition software and may include unintentional dictation errors.    Gregor Hams, MD 12/27/17 [place]    Gregor Hams, MD 12/27/17 [phone]

## 2017-12-26 NOTE — ED Triage Notes (Signed)
Per [facility], reports tonight pt developed fever, chills, generalized weakness and confusion. EMS reports pt's son stated pt said he was cold and went to bed, pt was found under blankets. Pt warm to touch on arrival. Pt was given 336ml of NS en route. EMS reports pt is on 2L chronically and hx of afib.

## 2017-12-27 DIAGNOSIS — K219 Gastro-esophageal reflux disease without esophagitis: Secondary | ICD-10-CM | POA: Diagnosis present

## 2017-12-27 DIAGNOSIS — Z8673 Personal history of transient ischemic attack (TIA), and cerebral infarction without residual deficits: Secondary | ICD-10-CM | POA: Diagnosis not present

## 2017-12-27 DIAGNOSIS — E785 Hyperlipidemia, unspecified: Secondary | ICD-10-CM | POA: Diagnosis present

## 2017-12-27 DIAGNOSIS — Z79899 Other long term (current) drug therapy: Secondary | ICD-10-CM | POA: Diagnosis not present

## 2017-12-27 DIAGNOSIS — T45515A Adverse effect of anticoagulants, initial encounter: Secondary | ICD-10-CM | POA: Diagnosis present

## 2017-12-27 DIAGNOSIS — J189 Pneumonia, unspecified organism: Secondary | ICD-10-CM | POA: Diagnosis present

## 2017-12-27 DIAGNOSIS — Z8546 Personal history of malignant neoplasm of prostate: Secondary | ICD-10-CM | POA: Diagnosis not present

## 2017-12-27 DIAGNOSIS — D6832 Hemorrhagic disorder due to extrinsic circulating anticoagulants: Secondary | ICD-10-CM | POA: Diagnosis present

## 2017-12-27 DIAGNOSIS — A419 Sepsis, unspecified organism: Secondary | ICD-10-CM | POA: Diagnosis present

## 2017-12-27 DIAGNOSIS — J9601 Acute respiratory failure with hypoxia: Secondary | ICD-10-CM | POA: Diagnosis present

## 2017-12-27 DIAGNOSIS — I509 Heart failure, unspecified: Secondary | ICD-10-CM | POA: Diagnosis present

## 2017-12-27 DIAGNOSIS — Z87891 Personal history of nicotine dependence: Secondary | ICD-10-CM | POA: Diagnosis not present

## 2017-12-27 DIAGNOSIS — J440 Chronic obstructive pulmonary disease with acute lower respiratory infection: Secondary | ICD-10-CM | POA: Diagnosis present

## 2017-12-27 DIAGNOSIS — D649 Anemia, unspecified: Secondary | ICD-10-CM | POA: Diagnosis present

## 2017-12-27 DIAGNOSIS — I110 Hypertensive heart disease with heart failure: Secondary | ICD-10-CM | POA: Diagnosis present

## 2017-12-27 DIAGNOSIS — Z7901 Long term (current) use of anticoagulants: Secondary | ICD-10-CM | POA: Diagnosis not present

## 2017-12-27 DIAGNOSIS — J441 Chronic obstructive pulmonary disease with (acute) exacerbation: Secondary | ICD-10-CM | POA: Diagnosis present

## 2017-12-27 DIAGNOSIS — I480 Paroxysmal atrial fibrillation: Secondary | ICD-10-CM | POA: Diagnosis present

## 2017-12-27 DIAGNOSIS — Z66 Do not resuscitate: Secondary | ICD-10-CM | POA: Diagnosis present

## 2017-12-27 LAB — MRSA PCR SCREENING: MRSA BY PCR: NEGATIVE

## 2017-12-27 LAB — URINALYSIS, COMPLETE (UACMP) WITH MICROSCOPIC
Bilirubin Urine: NEGATIVE
GLUCOSE, UA: NEGATIVE mg/dL
Hgb urine dipstick: NEGATIVE
KETONES UR: NEGATIVE mg/dL
Leukocytes, UA: NEGATIVE
Nitrite: POSITIVE — AB
PH: 5 (ref 5.0–8.0)
Protein, ur: NEGATIVE mg/dL
Specific Gravity, Urine: 1.019 (ref 1.005–1.030)

## 2017-12-27 LAB — BASIC METABOLIC PANEL
ANION GAP: 6 (ref 5–15)
BUN: 17 mg/dL (ref 6–20)
CALCIUM: 7.6 mg/dL — AB (ref 8.9–10.3)
CO2: 22 mmol/L (ref 22–32)
Chloride: 111 mmol/L (ref 101–111)
Creatinine, Ser: 1 mg/dL (ref 0.61–1.24)
GFR calc Af Amer: >60 mL/min (ref >60)
Glucose, Bld: 117 mg/dL — ABNORMAL HIGH (ref 65–99)
POTASSIUM: 3.8 mmol/L (ref 3.5–5.1)
SODIUM: 139 mmol/L (ref 135–145)

## 2017-12-27 LAB — CBC
HEMATOCRIT: 22.2 % — AB (ref 40.0–52.0)
HEMOGLOBIN: 7.1 g/dL — AB (ref 13.0–18.0)
MCH: 32 pg (ref 26.0–34.0)
MCHC: 31.8 g/dL — ABNORMAL LOW (ref 32.0–36.0)
MCV: 100.6 fL — ABNORMAL HIGH (ref 80.0–100.0)
Platelets: 135 10*3/uL — ABNORMAL LOW (ref 150–440)
RBC: 2.21 MIL/uL — ABNORMAL LOW (ref 4.40–5.90)
RDW: 19.7 % — ABNORMAL HIGH (ref 11.5–14.5)
WBC: 33.6 10*3/uL — AB (ref 3.8–10.6)

## 2017-12-27 LAB — PROTIME-INR
INR: 1.98
PROTHROMBIN TIME: 22.3 s — AB (ref 11.4–15.2)

## 2017-12-27 LAB — CBC WITH DIFFERENTIAL/PLATELET
BASOS ABS: 0 10*3/uL (ref 0–0.1)
Basophils Relative: 0 %
EOS ABS: 0.4 10*3/uL (ref 0–0.7)
Eosinophils Relative: 1 %
HCT: 30.5 % — ABNORMAL LOW (ref 40.0–52.0)
Hemoglobin: 9.6 g/dL — ABNORMAL LOW (ref 13.0–18.0)
LYMPHS ABS: 1.1 10*3/uL (ref 1.0–3.6)
Lymphocytes Relative: 3 %
MCH: 32.1 pg (ref 26.0–34.0)
MCHC: 31.5 g/dL — ABNORMAL LOW (ref 32.0–36.0)
MCV: 101.9 fL — AB (ref 80.0–100.0)
MONO ABS: 6.4 10*3/uL — AB (ref 0.2–1.0)
Monocytes Relative: 17 %
Neutro Abs: 29.5 10*3/uL — ABNORMAL HIGH (ref 1.4–6.5)
Neutrophils Relative %: 79 %
PLATELETS: 163 10*3/uL (ref 150–440)
RBC: 2.99 MIL/uL — AB (ref 4.40–5.90)
RDW: 19.7 % — AB (ref 11.5–14.5)
WBC: 37.4 10*3/uL — AB (ref 3.8–10.6)

## 2017-12-27 LAB — INFLUENZA PANEL BY PCR (TYPE A & B)
Influenza A By PCR: NEGATIVE
Influenza B By PCR: NEGATIVE

## 2017-12-27 LAB — COMPREHENSIVE METABOLIC PANEL
ALT: 14 U/L — AB (ref 17–63)
AST: 28 U/L (ref 15–41)
Albumin: 3.8 g/dL (ref 3.5–5.0)
Alkaline Phosphatase: 92 U/L (ref 38–126)
Anion gap: 10 (ref 5–15)
BILIRUBIN TOTAL: 0.7 mg/dL (ref 0.3–1.2)
BUN: 20 mg/dL (ref 6–20)
CO2: 22 mmol/L (ref 22–32)
CREATININE: 1.04 mg/dL (ref 0.61–1.24)
Calcium: 8.4 mg/dL — ABNORMAL LOW (ref 8.9–10.3)
Chloride: 108 mmol/L (ref 101–111)
Glucose, Bld: 116 mg/dL — ABNORMAL HIGH (ref 65–99)
Potassium: 4.1 mmol/L (ref 3.5–5.1)
Sodium: 140 mmol/L (ref 135–145)
TOTAL PROTEIN: 6.9 g/dL (ref 6.5–8.1)

## 2017-12-27 LAB — LACTIC ACID, PLASMA
LACTIC ACID, VENOUS: 2.9 mmol/L — AB (ref 0.5–1.9)
LACTIC ACID, VENOUS: 1.3 mmol/L (ref 0.5–1.9)
Lactic Acid, Venous: 3.3 mmol/L (ref 0.5–1.9)

## 2017-12-27 LAB — GLUCOSE, CAPILLARY: Glucose-Capillary: 111 mg/dL — ABNORMAL HIGH (ref 65–99)

## 2017-12-27 MED ORDER — SODIUM CHLORIDE 0.9 % IV SOLN
INTRAVENOUS | Status: DC
  Administered 2017-12-27 – 2017-12-29 (×5): via INTRAVENOUS

## 2017-12-27 MED ORDER — METHOTREXATE 2.5 MG PO TABS: 15.0000 mg | ORAL_TABLET | ORAL | Status: DC

## 2017-12-27 MED ORDER — HEPARIN SODIUM (PORCINE) 5000 UNIT/ML IJ SOLN: 5000.0000 [IU] | Freq: Three times a day (TID) | INTRAMUSCULAR | Status: DC

## 2017-12-27 MED ORDER — FUROSEMIDE 20 MG PO TABS: 20.0000 mg | ORAL_TABLET | ORAL | Status: DC

## 2017-12-27 MED ORDER — FERROUS SULFATE 325 (65 FE) MG PO TABS
325.0000 mg | ORAL_TABLET | Freq: Every morning | ORAL | Status: DC
  Administered 2017-12-27 – 2017-12-29 (×3): 325 mg via ORAL
  Filled 2017-12-27 (×3): qty 1

## 2017-12-27 MED ORDER — IPRATROPIUM-ALBUTEROL 0.5-2.5 (3) MG/3ML IN SOLN
3.0000 mL | Freq: Four times a day (QID) | RESPIRATORY_TRACT | Status: DC
  Administered 2017-12-27 – 2017-12-28 (×3): 3 mL via RESPIRATORY_TRACT
  Filled 2017-12-27 (×5): qty 3

## 2017-12-27 MED ORDER — SODIUM CHLORIDE 0.9 % IV BOLUS
1000.0000 mL | Freq: Once | INTRAVENOUS | Status: AC
  Administered 2017-12-27: 1000 mL via INTRAVENOUS

## 2017-12-27 MED ORDER — WARFARIN SODIUM 4 MG PO TABS
4.0000 mg | ORAL_TABLET | Freq: Every day | ORAL | Status: DC
  Administered 2017-12-27 – 2017-12-28 (×2): 4 mg via ORAL
  Filled 2017-12-27 (×2): qty 1

## 2017-12-27 MED ORDER — ACETAMINOPHEN 650 MG RE SUPP: 650.0000 mg | Freq: Four times a day (QID) | RECTAL | Status: DC | PRN

## 2017-12-27 MED ORDER — ACETAMINOPHEN 325 MG PO TABS: 650.0000 mg | ORAL_TABLET | Freq: Four times a day (QID) | ORAL | Status: DC | PRN

## 2017-12-27 MED ORDER — DIGOXIN 125 MCG PO TABS
0.1250 mg | ORAL_TABLET | ORAL | Status: DC
  Administered 2017-12-27 – 2017-12-29 (×3): 0.125 mg via ORAL
  Filled 2017-12-27 (×3): qty 1

## 2017-12-27 MED ORDER — MONTELUKAST SODIUM 10 MG PO TABS
10.0000 mg | ORAL_TABLET | Freq: Every day | ORAL | Status: DC
  Administered 2017-12-27 – 2017-12-29 (×3): 10 mg via ORAL
  Filled 2017-12-27 (×3): qty 1

## 2017-12-27 MED ORDER — OCUVITE-LUTEIN PO CAPS
1.0000 | ORAL_CAPSULE | Freq: Two times a day (BID) | ORAL | Status: DC
  Administered 2017-12-27 – 2017-12-29 (×5): 1 via ORAL
  Filled 2017-12-27 (×6): qty 1

## 2017-12-27 MED ORDER — TRAZODONE HCL 50 MG PO TABS: 25.0000 mg | ORAL_TABLET | Freq: Every evening | ORAL | Status: DC | PRN

## 2017-12-27 MED ORDER — BISACODYL 5 MG PO TBEC: 5.0000 mg | DELAYED_RELEASE_TABLET | Freq: Every day | ORAL | Status: DC | PRN

## 2017-12-27 MED ORDER — ACETAMINOPHEN 325 MG PO TABS
650.0000 mg | ORAL_TABLET | Freq: Once | ORAL | Status: AC
  Administered 2017-12-27 (×2): 650 mg via ORAL

## 2017-12-27 MED ORDER — CITALOPRAM HYDROBROMIDE 20 MG PO TABS
20.0000 mg | ORAL_TABLET | Freq: Every day | ORAL | Status: DC
  Administered 2017-12-27 – 2017-12-29 (×3): 20 mg via ORAL
  Filled 2017-12-27 (×3): qty 1

## 2017-12-27 MED ORDER — ONDANSETRON HCL 4 MG PO TABS: 4.0000 mg | ORAL_TABLET | Freq: Four times a day (QID) | ORAL | Status: DC | PRN

## 2017-12-27 MED ORDER — HYDROCODONE-ACETAMINOPHEN 5-325 MG PO TABS: 1.0000 | ORAL_TABLET | ORAL | Status: DC | PRN

## 2017-12-27 MED ORDER — ONDANSETRON HCL 4 MG/2ML IJ SOLN
4.0000 mg | Freq: Four times a day (QID) | INTRAMUSCULAR | Status: DC | PRN
Start: 2017-12-27 — End: 2017-12-29

## 2017-12-27 MED ORDER — FLUTICASONE FUROATE-VILANTEROL 100-25 MCG/INH IN AEPB
1.0000 | INHALATION_SPRAY | RESPIRATORY_TRACT | Status: DC
Start: 2017-12-27 — End: 2017-12-29
  Administered 2017-12-27 – 2017-12-29 (×3): 1 via RESPIRATORY_TRACT
  Filled 2017-12-27: qty 28

## 2017-12-27 MED ORDER — FOLIC ACID 1 MG PO TABS
1.0000 mg | ORAL_TABLET | Freq: Every day | ORAL | Status: DC
  Administered 2017-12-27 – 2017-12-29 (×3): 1 mg via ORAL
  Filled 2017-12-27 (×3): qty 1

## 2017-12-27 MED ORDER — PIPERACILLIN-TAZOBACTAM 3.375 G IVPB
3.3750 g | Freq: Three times a day (TID) | INTRAVENOUS | Status: DC
  Administered 2017-12-27 – 2017-12-28 (×5): 3.375 g via INTRAVENOUS
  Filled 2017-12-27 (×5): qty 50

## 2017-12-27 MED ORDER — WARFARIN SODIUM 4 MG PO TABS: 4.0000 mg | ORAL_TABLET | Freq: Every day | ORAL | Status: DC

## 2017-12-27 MED ORDER — ACETAMINOPHEN 325 MG PO TABS
ORAL_TABLET | ORAL | Status: AC
  Administered 2017-12-27: 650 mg via ORAL
  Filled 2017-12-27: qty 2

## 2017-12-27 MED ORDER — VANCOMYCIN HCL IN DEXTROSE 750-5 MG/150ML-% IV SOLN
750.0000 mg | INTRAVENOUS | Status: DC
  Filled 2017-12-27: qty 150

## 2017-12-27 MED ORDER — DOCUSATE SODIUM 100 MG PO CAPS
100.0000 mg | ORAL_CAPSULE | Freq: Two times a day (BID) | ORAL | Status: DC
  Administered 2017-12-27 – 2017-12-29 (×5): 100 mg via ORAL
  Filled 2017-12-27 (×5): qty 1

## 2017-12-27 NOTE — Evaluation (Signed)
Physical Therapy Evaluation Patient Details Name: Thomas Townsend MRN: [id number] DOB: 11-10-1930 Today's Date: 12/27/2017   History of Present Illness  Pt is an 84 year old male admitted from home for sepsis and pneumonia.  PMH includes arthritis, atrial fibrillation, CHF, COPD, HLD and CVA.  Visual barriers to learning documented.  Clinical Impression  Pt is an 84 year old male who lives in a one story home with his son.  Pt reports being independent at baseline with no AD.  He presents with visual deficits which sometimes inhibit pt from seeing obstacles during mobility.  Pt is independent with bed mobility and able to perform STS transfer without physical assist, though he requires VC's for safe transfers.  He was able to use RW with min VC's from PT and ambulate 100 ft without significant gait deviations or LOB's, though he did appear to be unsteady on his feet several times.  PT advised pt to use a SPC when in unfamiliar places and when ambulating longer distances for fall prevention.  Pt was agreeable.  Pt presented with overall WNL strength of UE/LE and reported no sensation loss in feet.  Pt will continue to benefit from skilled PT with focus on proper use of SPC, stair negotiation and balance.    Follow Up Recommendations Home health PT    Equipment Recommendations       Recommendations for Other Services       Precautions / Restrictions Precautions Precautions: Fall Restrictions Weight Bearing Restrictions: No      Mobility  Bed Mobility Overal bed mobility: Independent                Transfers Overall transfer level: Needs assistance Equipment used: None Transfers: Sit to/from Stand Sit to Stand: Supervision         General transfer comment: Pt able to perform STS without AD or physical assist from PT.  PT provided supervision for safety purposes.  Ambulation/Gait   Ambulation Distance (Feet): 100 Feet Assistive device: Rolling walker (2 wheeled)      Gait velocity interpretation: at or above normal speed for age/gender General Gait Details: Pt was able to use RW safely but stated that he did not need it.  Pt ambulated 37 ft without RW with no significant LOB's, though pt demonstrated difficulty in identifying large obstacles on one occasion due to visiual deficits.  PT discussed with pt use of SPC during longer distance ambulation in areas which he is not familiar.  Pt stated that he would be willing and that he has a SPC at home.  Stairs            Wheelchair Mobility    Modified Rankin (Stroke Patients Only)       Balance Overall balance assessment: Independent                                           Pertinent Vitals/Pain Pain Assessment: No/denies pain    Home Living Family/patient expects to be discharged to:: Private residence Living Arrangements: Children Available Help at Discharge: Family;Available 24 hours/day Type of Home: House Home Access: Stairs to enter Entrance Stairs-Rails: Can reach both Entrance Stairs-Number of Steps: 3 Home Layout: One level Home Equipment: Walker - 2 wheels;Crutches      Prior Function Level of Independence: Needs assistance   Gait / Transfers Assistance Needed: Pt reports not using AD  for home or community ambulation but son drives him when he needs to go somewhere.  ADL's / Homemaking Assistance Needed: Independent.        Hand Dominance        Extremity/Trunk Assessment   Upper Extremity Assessment Upper Extremity Assessment: Overall WFL for tasks assessed    Lower Extremity Assessment Lower Extremity Assessment: Overall WFL for tasks assessed    Cervical / Trunk Assessment Cervical / Trunk Assessment: Normal  Communication   Communication: HOH  Cognition Arousal/Alertness: Awake/alert Behavior During Therapy: WFL for tasks assessed/performed Overall Cognitive Status: Within Functional Limits for tasks assessed                                         General Comments      Exercises     Assessment/Plan    PT Assessment Patient needs continued PT services  PT Problem List Decreased balance;Decreased coordination       PT Treatment Interventions DME instruction;Gait training;Stair training;Functional mobility training;Balance training;Therapeutic exercise;Therapeutic activities;Patient/family education    PT Goals (Current goals can be found in the Care Plan section)  Acute Rehab PT Goals Patient Stated Goal: To return home and back to general activity PT Goal Formulation: With patient Time For Goal Achievement: 01/10/18 Potential to Achieve Goals: Good    Frequency Min 2X/week   Barriers to discharge        Co-evaluation               AM-PAC PT "6 Clicks" Daily Activity  Outcome Measure Difficulty turning over in bed (including adjusting bedclothes, sheets and blankets)?: None Difficulty moving from lying on back to sitting on the side of the bed? : None Difficulty sitting down on and standing up from a chair with arms (e.g., wheelchair, bedside commode, etc,.)?: A Little Help needed moving to and from a bed to chair (including a wheelchair)?: A Little Help needed walking in hospital room?: A Little Help needed climbing 3-5 steps with a railing? : A Little 6 Click Score: 20    End of Session Equipment Utilized During Treatment: Gait belt Activity Tolerance: Patient tolerated treatment well Patient left: in bed;with call bell/phone within reach;with bed alarm set Nurse Communication: Mobility status PT Visit Diagnosis: Unsteadiness on feet (R26.81);Muscle weakness (generalized) (M62.81)    Time: 1630-1700 PT Time Calculation (min) (ACUTE ONLY): 30 min   Charges:   PT Evaluation $PT Eval Low Complexity: 1 Low PT Treatments $Gait Training: 8-22 mins   PT G Codes:   PT G-Codes **NOT FOR INPATIENT CLASS** Functional Assessment Tool Used: AM-PAC 6 Clicks Basic Mobility     Roxanne Gates, PT, DPT    Roxanne Gates 12/27/2017, 5:17 PM

## 2017-12-27 NOTE — Progress Notes (Signed)
Pharmacy Antibiotic Note  Thomas Townsend is a 84 y.o. male admitted on 12/26/2017 with sepsis.  Pharmacy has been consulted for vancomycin and Zosyn dosing.  Plan: DW 59kg  Vd 41L kei 0.035 hr-1  T1/2 20 hours Vancomycin 750 mg q 24 hours ordered with stacked dosing. Level before 5th dose. Gioal trough 15-20  Zosyn 3.375g IV q8h (4 hour infusion).  Height: 5' (152.4 cm) Weight: 130 lb 15.3 oz (59.4 kg) IBW/kg (Calculated) : 50  Temp (24hrs), Avg:101.8 F (38.8 C), Min:101.8 F (38.8 C), Max:101.8 F (38.8 C)  Recent Labs  Lab 12/26/17 2350 12/26/17 2354  CREATININE  --  1.04  LATICACIDVEN 3.3*  --     Estimated Creatinine Clearance: 37.4 mL/min (by C-G formula based on SCr of 1.04 mg/dL).    No Known Allergies  Antimicrobials this admission: Vancomycin, Zosyn 4/2  >>    >>   Dose adjustments this admission:   Microbiology results: 4/1 BCx: pending      4/1 UA: pending 4/1 CXR: "interstitial prominence confluent in lung bases concerning for recurrent pneumonia"  Thank you for allowing pharmacy to be a part of this patient's care.  Milah Recht S 12/27/2017 12:56 AM

## 2017-12-27 NOTE — H&P (Signed)
[place] [facility] [place] NAME: Thomas Townsend    MR#:  [id number]  DATE OF BIRTH:  1931/06/01  DATE OF ADMISSION:  12/26/2017  PRIMARY CARE PHYSICIAN: Elby Beck, FNP   REQUESTING/REFERRING PHYSICIAN:   CHIEF COMPLAINT:   Chief Complaint  Patient presents with  . Fever  . Weakness    HISTORY OF PRESENT ILLNESS: Thomas Townsend  is a 84 y.o. male with a known history of atrial fibrillation, CHF, COPD, hypertension, osteoarthritis and hyperlipidemia. Patient was brought to emergency room for acute onset of chills, fever, cough and shortness of breath, started suddenly, in the past 24 hours, progressively getting worse.  Family noticed him to be more confused and with generalized weakness in the past 24 hours.  His memory is usually good and he is pretty active around the house at baseline.  Patient is supposed to be on 2 L oxygen per nasal cannula continuously, but he only wears it as needed. While in the emergency room patient was noted with hypoxia, fever, tachycardia and tachypnea. Blood test done emergency room were notable for elevated WBC at 38,000.  Lactic acid is elevated at 3.3.  UA is negative for UTI. Chest x-ray, reviewed by myself, shows basilar infiltrates and worsening COPD changes. Patient is admitted for further evaluation and treatment.  PAST MEDICAL HISTORY:   Past Medical History:  Diagnosis Date  . Arthritis   . Atrial fibrillation ([place])   . CHF (congestive heart failure) ([place])   . COPD (chronic obstructive pulmonary disease) ([place])   . Dysrhythmia   . GERD (gastroesophageal reflux disease)   . Hyperlipemia   . Hypertension   . Leukocytosis 01/14/2016  . Prostate cancer ([place])   . Stroke [facility])     PAST SURGICAL HISTORY:  Past Surgical History:  Procedure Laterality Date  . CHOLECYSTECTOMY    . COLONOSCOPY WITH PROPOFOL N/A 04/04/2015   Procedure: COLONOSCOPY WITH PROPOFOL;  Surgeon: Manya Silvas, MD;   Location: [facility] ENDOSCOPY;  Service: Endoscopy;  Laterality: N/A;  . ESOPHAGOGASTRODUODENOSCOPY N/A 04/04/2015   Procedure: ESOPHAGOGASTRODUODENOSCOPY (EGD);  Surgeon: Manya Silvas, MD;  Location: [facility] ENDOSCOPY;  Service: Endoscopy;  Laterality: N/A;  . EYE SURGERY      SOCIAL HISTORY:  Social History   Tobacco Use  . Smoking status: Former Smoker    Years: 20.00    Types: Cigarettes  . Smokeless tobacco: Never Used  Substance Use Topics  . Alcohol use: Yes    Alcohol/week: 0.6 oz    Types: 1 Cans of beer per week    Comment: Wine every once in a while    FAMILY HISTORY:  Family History  Problem Relation Age of Onset  . Hypertension Other     DRUG ALLERGIES: No Known Allergies  REVIEW OF SYSTEMS:   CONSTITUTIONAL: Positive for fever and chills, fatigue and generalized weakness.  EYES: No blurred or double vision.  EARS, NOSE, AND THROAT: No tinnitus or ear pain.  Bilateral hearing deficit is noted. RESPIRATORY: Positive for cough, shortness of breath; no wheezing or hemoptysis.  CARDIOVASCULAR: No chest pain, orthopnea, edema.  GASTROINTESTINAL: No nausea, vomiting, diarrhea or abdominal pain.  GENITOURINARY: No dysuria, hematuria.  ENDOCRINE: No polyuria, nocturia,  HEMATOLOGY: No bleeding SKIN: No rash or lesion. MUSCULOSKELETAL: Positive history of osteoarthritis.   NEUROLOGIC: No focal weakness.  PSYCHIATRY: No anxiety or depression.   MEDICATIONS AT HOME:  Prior to Admission medications   Medication Sig Start Date End  Date Taking? Authorizing Provider  acetaminophen (TYLENOL) 325 MG tablet Take 2 tablets (650 mg total) by mouth every 6 (six) hours as needed for mild pain (or Fever >/= 101). 07/03/17  Yes Gouru, Illene Silver, MD  albuterol (PROVENTIL HFA;VENTOLIN HFA) 108 (90 Base) MCG/ACT inhaler Inhale 2 puffs into the lungs every 6 (six) hours as needed for wheezing or shortness of breath. 10/07/16  Yes Sudini, Srikar, MD  BREO ELLIPTA 100-25 MCG/INH AEPB Inhale 1  puff into the lungs every morning. 10/03/17  Yes Wilhelmina Mcardle, MD  citalopram (CELEXA) 20 MG tablet Take 20 mg by mouth daily.   Yes [provider]  digoxin (LANOXIN) 0.125 MG tablet Take 1 tablet (0.125 mg total) every morning by mouth. 08/15/17  Yes Elby Beck, FNP  folic acid (FOLVITE) 1 MG tablet Take 1 mg by mouth daily.   Yes [provider]  furosemide (LASIX) 20 MG tablet Take 1 tablet (20 mg total) every other day by mouth. In the morning 08/15/17  Yes Elby Beck, FNP  methotrexate (RHEUMATREX) 2.5 MG tablet TAKE 6 TABLETS (15 MG TOTAL) BY MOUTH EVERY 7 (SEVEN) DAYS. 12/24/15  Yes [provider]  montelukast (SINGULAIR) 10 MG tablet Take 10 mg by mouth daily.    Yes [provider]  warfarin (COUMADIN) 4 MG tablet Take 4 mg by mouth daily.   Yes [provider]  Besifloxacin HCl (BESIVANCE) 0.6 % SUSP Apply to eye.    [provider]  ferrous sulfate 325 (65 FE) MG tablet Take 325 mg by mouth every morning. Reported on 02/25/2016    [provider]  Multiple Vitamins-Minerals (PRESERVISION AREDS PO) Take 1 tablet by mouth 2 (two) times daily.     [provider]  warfarin (COUMADIN) 5 MG tablet Take 1 tablet (5 mg total) by mouth one time only at 6 PM. Patient not taking: Reported on 12/27/2017 07/03/17 09/15/67  [name], MD      PHYSICAL EXAMINATION:   VITAL SIGNS: Blood pressure (!) 98/48, pulse 85, temperature 99.3 F (37.4 C), temperature source Oral, resp. rate 20, height 5' (1.524 m), weight 58 kg (127 lb 12.8 oz), SpO2 97 %.  GENERAL:  84 y.o.-year-old patient lying in the bed, in mild distress, secondary to generalized weakness and shortness of breath.  She looks acutely ill but nontoxic EYES: Pupils equal, round, reactive to light and accommodation. No scleral icterus. Extraocular muscles intact.  HEENT: Head atraumatic, normocephalic. Oropharynx and nasopharynx clear.  NECK:  Supple,  no jugular venous distention. No thyroid enlargement, no tenderness.  LUNGS: Reduced breath sounds and scattered wheezing bilaterally. No use of accessory muscles of respiration.  CARDIOVASCULAR: S1, S2 normal. No S3/S4.  ABDOMEN: Soft, nontender, nondistended. Bowel sounds present. No organomegaly or mass.  EXTREMITIES: No pedal edema, cyanosis, or clubbing.  NEUROLOGIC: No focal weakness.  PSYCHIATRIC: The patient is alert and oriented x 3.  SKIN: No obvious rash, lesion, or ulcer.   LABORATORY PANEL:   CBC Recent Labs  Lab 12/26/17 2354  WBC 37.4*  HGB 9.6*  HCT 30.5*  PLT 163  MCV 101.9*  MCH 32.1  MCHC 31.5*  RDW 19.7*  LYMPHSABS 1.1  MONOABS 6.4*  EOSABS 0.4  BASOSABS 0.0   ------------------------------------------------------------------------------------------------------------------  Chemistries  Recent Labs  Lab 12/26/17 2354  NA 140  K 4.1  CL 108  CO2 22  GLUCOSE 116*  BUN 20  CREATININE 1.04  CALCIUM 8.4*  AST 28  ALT 14*  ALKPHOS 92  BILITOT 0.7   ------------------------------------------------------------------------------------------------------------------ estimated creatinine clearance is 37.4 mL/min (by C-G formula based on SCr of 1.04 mg/dL). ------------------------------------------------------------------------------------------------------------------ No results for input(s): TSH, T4TOTAL, T3FREE, THYROIDAB in the last 72 hours.  Invalid input(s): FREET3   Coagulation profile No results for input(s): INR, PROTIME in the last 168 hours. ------------------------------------------------------------------------------------------------------------------- No results for input(s): DDIMER in the last 72 hours. -------------------------------------------------------------------------------------------------------------------  Cardiac Enzymes No results for input(s): CKMB, TROPONINI, MYOGLOBIN in the last 168 hours.  Invalid input(s):  CK ------------------------------------------------------------------------------------------------------------------ Invalid input(s): POCBNP  ---------------------------------------------------------------------------------------------------------------  Urinalysis    Component Value Date/Time   COLORURINE YELLOW (A) 12/27/2017 0355   APPEARANCEUR HAZY (A) 12/27/2017 0355   LABSPEC 1.019 12/27/2017 0355   PHURINE 5.0 12/27/2017 0355   GLUCOSEU NEGATIVE 12/27/2017 0355   HGBUR NEGATIVE 12/27/2017 0355   BILIRUBINUR NEGATIVE 12/27/2017 0355   KETONESUR NEGATIVE 12/27/2017 0355   PROTEINUR NEGATIVE 12/27/2017 0355   NITRITE POSITIVE (A) 12/27/2017 0355   LEUKOCYTESUR NEGATIVE 12/27/2017 0355     RADIOLOGY: Dg Chest Portable 1 View  Result Date: 12/27/2017 CLINICAL DATA:  Generalized weakness, fever, chills and confusion tonight. History of COPD. EXAM: PORTABLE CHEST 1 VIEW COMPARISON:  Chest radiograph June 29, 2017 FINDINGS: Cardiac silhouette is mildly enlarged and unchanged. Calcified aortic knob. Mild chronic interstitial changes confluent in the lung bases similar to prior examination. No pleural effusion. No pneumothorax. Osteopenia. IMPRESSION: Similar interstitial prominence confluent in lung bases concerning for recurrent pneumonia with probable underlying fibrosis/COPD. Aortic Atherosclerosis (ICD10-I70.0). Electronically Signed   By: Elon Alas M.D.   On: 12/27/2017 00:13    EKG: Orders placed or performed during the hospital encounter of 12/26/17  . ED EKG 12-Lead  . ED EKG 12-Lead  . EKG 12-Lead  . EKG 12-Lead    IMPRESSION AND PLAN:  1.  Sepsis likely secondary to community-acquired pneumonia.  We will start oxygen therapy, IV fluids and Zosyn and vancomycin IV.  Follow lactic acid level. 2.  Acute hypoxemic respiratory failure secondary to community-acquired pneumonia.  See treatment as above under #1. 3.  CAP, started antibiotics, oxygen therapy,  nebulizer treatments. 4.  Acute COPD exacerbation, secondary to CAP, started antibiotics, oxygen therapy, nebulizer treatments. 5.  Paroxysmal atrial fibrillation, currently in sinus rhythm.  Continue anticoagulation with Coumadin.   All the records are reviewed and case discussed with ED provider. Management plans discussed with the patient, family and they are in agreement.  CODE STATUS:    Code Status Orders  (From admission, onward)        Start     Ordered   12/27/17 0311  Do not attempt resuscitation (DNR)  Continuous    Question Answer Comment  In the event of cardiac or respiratory ARREST Do not call a "code blue"   In the event of cardiac or respiratory ARREST Do not perform Intubation, CPR, defibrillation or ACLS   In the event of cardiac or respiratory ARREST Use medication by any route, position, wound care, and other measures to relive pain and suffering. May use oxygen, suction and manual treatment of airway obstruction as needed for comfort.      12/27/17 0310    Code Status History    Date Active Date Inactive Code Status Order ID Comments User Context   06/30/2017 0039 07/03/2017 1915 DNR [id number]  Lance Coon, MD Inpatient   03/22/2017 2206 03/25/2017 1441 DNR [id number]  Ivor Costa, MD ED   10/05/2016 1048 10/07/2016 1534 Full Code [id number]  Hillary Bow,  MD ED   06/21/2016 0222 06/26/2016 1939 Partial Code [id number]  Lance Coon, MD Inpatient   02/26/2016 1434 02/27/2016 1503 Partial Code [id number]  Vaughan Basta, MD Inpatient   08/06/2015 0259 08/11/2015 1521 Full Code [id number]  Hower, Aaron Mose, MD ED    Advance Directive Documentation     Most Recent Value  Type of Advance Directive  Healthcare Power of Attorney  Pre-existing out of facility DNR order (yellow form or pink MOST form)  -  "MOST" Form in Place?  -       TOTAL TIME TAKING CARE OF THIS PATIENT: 45 minutes.    Amelia Jo M.D on 12/27/2017 at 4:55 AM  Between 7am to 6pm - Pager -  [phone]  After 6pm go to [email or web address] - password [email or web address] [facility]  Office  [phone]  CC: Primary care physician; Elby Beck, FNP

## 2017-12-27 NOTE — Progress Notes (Signed)
ANTICOAGULATION CONSULT NOTE - Initial Consult  Pharmacy Consult for warfarin dosing Indication: PAF  No Known Allergies  Patient Measurements: Height: 5' (152.4 cm) Weight: 127 lb 12.8 oz (58 kg) IBW/kg (Calculated) : 50 Heparin Dosing Weight: n/a  Vital Signs: Temp: 99.3 F (37.4 C) (04/02 0124) Temp Source: Oral (04/02 0124) BP: 98/48 (04/02 0302) Pulse Rate: 85 (04/02 0302)  Labs: Recent Labs    12/26/17 2354 12/27/17 0538  HGB 9.6*  --   HCT 30.5*  --   PLT 163  --   LABPROT  --  22.3*  INR  --  1.98  CREATININE 1.04 1.00    Estimated Creatinine Clearance: 38.9 mL/min (by C-G formula based on SCr of 1 mg/dL).   Medical History: Past Medical History:  Diagnosis Date  . Arthritis   . Atrial fibrillation ([place])   . CHF (congestive heart failure) ([place])   . COPD (chronic obstructive pulmonary disease) ([place])   . Dysrhythmia   . GERD (gastroesophageal reflux disease)   . Hyperlipemia   . Hypertension   . Leukocytosis 01/14/2016  . Prostate cancer ([place])   . Stroke [facility])     Medications:  Home dose of warfarin 4 mg daily  Assessment: INR slightly subtherapeutic on admission  Goal of Therapy:  INR 2-3    Plan:  Resume home regimen as above. INR daily while on antibiotics.  Yudit Modesitt S 12/27/2017,6:38 AM

## 2017-12-27 NOTE — Progress Notes (Signed)
CODE SEPSIS - PHARMACY COMMUNICATION  **Broad Spectrum Antibiotics should be administered within 1 hour of Sepsis diagnosis**  Time Code Sepsis Called/Page Received: 4/1 2354   Antibiotics Ordered: 4/1 2350  Time of 1st antibiotic administration: 2350 4/2 0007  Additional action taken by pharmacy:   If necessary, Name of Provider/Nurse Contacted:     Eloise Harman ,PharmD Clinical Pharmacist  12/27/2017  12:26 AM

## 2017-12-27 NOTE — Progress Notes (Signed)
The patient has no complaints except generalized weakness.  Off oxygen. Blood pressure is low side at 93/52, heart rate is 63. Physical examinations is unremarkable. Sepsis secondary to community-acquired pneumonia and UTI. Continue Zosyn discontinue vancomycin nebulizer and other current treatment.  PT evaluation.  I discussed with the patient and nurse and case manager.  Time spent about 30 minutes.

## 2017-12-28 LAB — CBC
HCT: 21.3 % — ABNORMAL LOW (ref 40.0–52.0)
HEMOGLOBIN: 6.8 g/dL — AB (ref 13.0–18.0)
MCH: 32.2 pg (ref 26.0–34.0)
MCHC: 31.9 g/dL — AB (ref 32.0–36.0)
MCV: 100.9 fL — ABNORMAL HIGH (ref 80.0–100.0)
Platelets: 138 10*3/uL — ABNORMAL LOW (ref 150–440)
RBC: 2.11 MIL/uL — ABNORMAL LOW (ref 4.40–5.90)
RDW: 20 % — AB (ref 11.5–14.5)
WBC: 20 10*3/uL — ABNORMAL HIGH (ref 3.8–10.6)

## 2017-12-28 LAB — BASIC METABOLIC PANEL
Anion gap: 5 (ref 5–15)
BUN: 14 mg/dL (ref 6–20)
CALCIUM: 7.8 mg/dL — AB (ref 8.9–10.3)
CHLORIDE: 112 mmol/L — AB (ref 101–111)
CO2: 23 mmol/L (ref 22–32)
CREATININE: 0.92 mg/dL (ref 0.61–1.24)
GFR calc non Af Amer: >60 mL/min (ref >60)
Glucose, Bld: 96 mg/dL (ref 65–99)
Potassium: 3.7 mmol/L (ref 3.5–5.1)
SODIUM: 140 mmol/L (ref 135–145)

## 2017-12-28 LAB — PREPARE RBC (CROSSMATCH)

## 2017-12-28 LAB — PROTIME-INR
INR: 1.7
PROTHROMBIN TIME: 19.8 s — AB (ref 11.4–15.2)

## 2017-12-28 LAB — GLUCOSE, CAPILLARY: GLUCOSE-CAPILLARY: 87 mg/dL (ref 65–99)

## 2017-12-28 MED ORDER — SODIUM CHLORIDE 0.9 % IV SOLN
Freq: Once | INTRAVENOUS | Status: AC
  Administered 2017-12-28: 19:00:00 via INTRAVENOUS

## 2017-12-28 MED ORDER — AMPICILLIN-SULBACTAM SODIUM 3 (2-1) G IJ SOLR
3.0000 g | Freq: Four times a day (QID) | INTRAMUSCULAR | Status: DC
  Administered 2017-12-28 – 2017-12-29 (×3): 3 g via INTRAVENOUS
  Filled 2017-12-28 (×5): qty 3

## 2017-12-28 MED ORDER — SODIUM CHLORIDE 0.9 % IV SOLN: Freq: Once | INTRAVENOUS | Status: DC

## 2017-12-28 MED ORDER — IPRATROPIUM-ALBUTEROL 0.5-2.5 (3) MG/3ML IN SOLN: 3.0000 mL | Freq: Four times a day (QID) | RESPIRATORY_TRACT | Status: DC | PRN

## 2017-12-28 NOTE — Progress Notes (Signed)
ANTICOAGULATION CONSULT NOTE - Initial Consult  Pharmacy Consult for warfarin dosing Indication: PAF  No Known Allergies  Patient Measurements: Height: 5' (152.4 cm) Weight: 130 lb 15.3 oz (59.4 kg) IBW/kg (Calculated) : 50 Heparin Dosing Weight: n/a  Vital Signs: Temp: 98 F (36.7 C) (04/03 0405) Temp Source: Oral (04/03 0405) BP: 97/58 (04/03 0557) Pulse Rate: 74 (04/03 0610)  Labs: Recent Labs    12/26/17 2354 12/27/17 0538 12/28/17 0255  HGB 9.6* 7.1* 6.8*  HCT 30.5* 22.2* 21.3*  PLT 163 135* 138*  LABPROT  --  22.3* 19.8*  INR  --  1.98 1.70  CREATININE 1.04 1.00 0.92    Estimated Creatinine Clearance: 42.3 mL/min (by C-G formula based on SCr of 0.92 mg/dL).   Medical History: Past Medical History:  Diagnosis Date  . Arthritis   . Atrial fibrillation ([place])   . CHF (congestive heart failure) ([place])   . COPD (chronic obstructive pulmonary disease) ([place])   . Dysrhythmia   . GERD (gastroesophageal reflux disease)   . Hyperlipemia   . Hypertension   . Leukocytosis 01/14/2016  . Prostate cancer ([place])   . Stroke [facility])     Medications:  Home dose of warfarin 4 mg daily  Assessment: INR slightly subtherapeutic on admission  DATE INR DOSE 4/2 1.98 4 MG 4/3 1.7  Goal of Therapy:  INR 2-3    Plan:  INR subtherapeutic. Likely due to missed/delayed dose before admission. Continue current regimen and will check INR daily while on antibiotics.  Laural Benes, Pharm.D., BCPS Clinical Pharmacist 12/28/2017,7:35 AM

## 2017-12-28 NOTE — Progress Notes (Signed)
[place] [facility] [place] NAME: Thomas Townsend    MR#:  [id number]  DATE OF BIRTH:  12/26/1930  SUBJECTIVE:  CHIEF COMPLAINT: Feeling better.  Shortness of breath is improving.  REVIEW OF SYSTEMS:  CONSTITUTIONAL: No fever, fatigue or weakness.  EYES: No blurred or double vision.  EARS, NOSE, AND THROAT: No tinnitus or ear pain.  RESPIRATORY:  reporting intermittent episodes of cough, improving shortness of breath, denies wheezing or hemoptysis.  CARDIOVASCULAR: No chest pain, orthopnea, edema.  GASTROINTESTINAL: No nausea, vomiting, diarrhea or abdominal pain.  GENITOURINARY: No dysuria, hematuria.  ENDOCRINE: No polyuria, nocturia,  HEMATOLOGY: No anemia, easy bruising or bleeding SKIN: No rash or lesion. MUSCULOSKELETAL: No joint pain or arthritis.   NEUROLOGIC: No tingling, numbness, weakness.  PSYCHIATRY: No anxiety or depression.   DRUG ALLERGIES:  No Known Allergies  VITALS:  Blood pressure (!) 107/55, pulse 92, temperature 98.1 F (36.7 C), resp. rate (!) 22, height 5' (1.524 m), weight 59.4 kg (130 lb 15.3 oz), SpO2 99 %.  PHYSICAL EXAMINATION:  GENERAL:  84 y.o.-year-old patient lying in the bed with no acute distress.  EYES: Pupils equal, round, reactive to light and accommodation. No scleral icterus. Extraocular muscles intact.  HEENT: Head atraumatic, normocephalic. Oropharynx and nasopharynx clear.  NECK:  Supple, no jugular venous distention. No thyroid enlargement, no tenderness.  LUNGS: Mod breath sounds bilaterally, no wheezing, rales,rhonchi or crepitation. No use of accessory muscles of respiration.  CARDIOVASCULAR: S1, S2 normal. No murmurs, rubs, or gallops.  ABDOMEN: Soft, nontender, nondistended. Bowel sounds present. No organomegaly or mass.  EXTREMITIES: No pedal edema, cyanosis, or clubbing.  NEUROLOGIC: Cranial nerves II through XII are intact. Muscle strength 5/5 in all extremities. Sensation intact.  Gait not checked.  PSYCHIATRIC: The patient is alert and oriented x 3.  SKIN: No obvious rash, lesion, or ulcer.    LABORATORY PANEL:   CBC Recent Labs  Lab 12/28/17 0255  WBC 20.0*  HGB 6.8*  HCT 21.3*  PLT 138*   ------------------------------------------------------------------------------------------------------------------  Chemistries  Recent Labs  Lab 12/26/17 2354  12/28/17 0255  NA 140   < > 140  K 4.1   < > 3.7  CL 108   < > 112*  CO2 22   < > 23  GLUCOSE 116*   < > 96  BUN 20   < > 14  CREATININE 1.04   < > 0.92  CALCIUM 8.4*   < > 7.8*  AST 28  --   --   ALT 14*  --   --   ALKPHOS 92  --   --   BILITOT 0.7  --   --    < > = values in this interval not displayed.   ------------------------------------------------------------------------------------------------------------------  Cardiac Enzymes No results for input(s): TROPONINI in the last 168 hours. ------------------------------------------------------------------------------------------------------------------  RADIOLOGY:  Dg Chest Portable 1 View  Result Date: 12/27/2017 CLINICAL DATA:  Generalized weakness, fever, chills and confusion tonight. History of COPD. EXAM: PORTABLE CHEST 1 VIEW COMPARISON:  Chest radiograph June 29, 2017 FINDINGS: Cardiac silhouette is mildly enlarged and unchanged. Calcified aortic knob. Mild chronic interstitial changes confluent in the lung bases similar to prior examination. No pleural effusion. No pneumothorax. Osteopenia. IMPRESSION: Similar interstitial prominence confluent in lung bases concerning for recurrent pneumonia with probable underlying fibrosis/COPD. Aortic Atherosclerosis (ICD10-I70.0). Electronically Signed   By: Elon Alas M.D.   On: 12/27/2017 00:13    EKG:   Orders  placed or performed during the hospital encounter of 12/26/17  . ED EKG 12-Lead  . ED EKG 12-Lead  . EKG 12-Lead  . EKG 12-Lead    ASSESSMENT AND PLAN:   1.  Sepsis likely  secondary to community-acquired pneumonia.  Clinically improving, continue oxygen therapy, IV fluids and started on Zosyn and vancomycin IV.   MRSA PCR negative discontinue vancomycin Follow lactic acid level. Blood cultures negative so far and urine culture with Staphylococcus epidermidis  2.  Acute hypoxemic respiratory failure secondary to community-acquired pneumonia.  Clinically improving wean off oxygen as tolerated  3.  CAP, antibiotics, oxygen therapy, nebulizer treatments.  4.  Acute COPD exacerbation, secondary to CAP, antibiotics, oxygen therapy, nebulizer treatments.  5.  Paroxysmal atrial fibrillation, currently in sinus rhythm.  Continue anticoagulation with Coumadin.   INR 1.70  6.   chronic anemia-probably from micro bleeding from Coumadin Repeat hemoglobin and if hemoglobin is at or lower than 7 will transfuse 1 unit   Generalized weakness PT assessment     All the records are reviewed and case discussed with Care Management/Social Workerr. Management plans discussed with the patient, family and they are in agreement.  CODE STATUS: dnr  TOTAL TIME TAKING CARE OF THIS PATIENT: 36 minutes.   POSSIBLE D/C IN 1-2 DAYS, DEPENDING ON CLINICAL CONDITION.  Note: This dictation was prepared with Dragon dictation along with smaller phrase technology. Any transcriptional errors that result from this process are unintentional.   [name] M.D on 12/28/2017 at 4:54 PM  Between 7am to 6pm - Pager - [phone] After 6pm go to [email or web address] - password [email or web address] [place] [facility]  Office  [phone]  CC: Primary care physician; Elby Beck, FNP

## 2017-12-28 NOTE — Progress Notes (Signed)
hgb is 6.8.  Dr Margaretmary Eddy has ordered 1 unit of PRBC

## 2017-12-28 NOTE — Progress Notes (Signed)
Orders clarified with Dr Margaretmary Eddy.  She wants the patient to received 1 unit of blood and have labs drawn 2 hours after the blood is complete

## 2017-12-29 LAB — CBC
HEMATOCRIT: 27.6 % — AB (ref 40.0–52.0)
HEMOGLOBIN: 9.1 g/dL — AB (ref 13.0–18.0)
MCH: 31.9 pg (ref 26.0–34.0)
MCHC: 32.8 g/dL (ref 32.0–36.0)
MCV: 97.3 fL (ref 80.0–100.0)
Platelets: 153 10*3/uL (ref 150–440)
RBC: 2.84 MIL/uL — ABNORMAL LOW (ref 4.40–5.90)
RDW: 19.7 % — ABNORMAL HIGH (ref 11.5–14.5)
WBC: 15.9 10*3/uL — AB (ref 3.8–10.6)

## 2017-12-29 LAB — BASIC METABOLIC PANEL
ANION GAP: 4 — AB (ref 5–15)
BUN: 13 mg/dL (ref 6–20)
CALCIUM: 7.8 mg/dL — AB (ref 8.9–10.3)
CO2: 23 mmol/L (ref 22–32)
Chloride: 111 mmol/L (ref 101–111)
Creatinine, Ser: 0.76 mg/dL (ref 0.61–1.24)
GFR calc non Af Amer: >60 mL/min (ref >60)
Glucose, Bld: 86 mg/dL (ref 65–99)
Potassium: 3.7 mmol/L (ref 3.5–5.1)
SODIUM: 138 mmol/L (ref 135–145)

## 2017-12-29 LAB — TYPE AND SCREEN
ABO/RH(D): O NEG
Antibody Screen: NEGATIVE
UNIT DIVISION: 0

## 2017-12-29 LAB — BPAM RBC
BLOOD PRODUCT EXPIRATION DATE: 201904112359
ISSUE DATE / TIME: 201904031818
UNIT TYPE AND RH: 9500

## 2017-12-29 LAB — PROTIME-INR
INR: 1.45
PROTHROMBIN TIME: 17.5 s — AB (ref 11.4–15.2)

## 2017-12-29 LAB — GLUCOSE, CAPILLARY: GLUCOSE-CAPILLARY: 91 mg/dL (ref 65–99)

## 2017-12-29 MED ORDER — WARFARIN SODIUM 5 MG PO TABS
5.0000 mg | ORAL_TABLET | Freq: Every day | ORAL | Status: DC
  Filled 2017-12-29: qty 1

## 2017-12-29 MED ORDER — WARFARIN - PHARMACIST DOSING INPATIENT: Freq: Every day | Status: DC

## 2017-12-29 MED ORDER — AMOXICILLIN-POT CLAVULANATE 875-125 MG PO TABS: 1.0000 | ORAL_TABLET | Freq: Two times a day (BID) | ORAL | 0 refills | Status: AC

## 2017-12-29 MED ORDER — WARFARIN SODIUM 5 MG PO TABS: 5.0000 mg | ORAL_TABLET | Freq: Once | ORAL | 0 refills | Status: DC

## 2017-12-29 NOTE — Progress Notes (Signed)
Patient cleared for discharge to home. Education complete. AVS printed. Discharge instructions given. All questions answered for patient clarification.  Prescriptions given, pharmacy verified.  IV removed.  Discharged to private residence Via private vehicle (daughter)

## 2017-12-29 NOTE — Discharge Summary (Signed)
[facility] NAME: Thomas Townsend    MR#:  [id number]  DATE OF BIRTH:  [birth year]  DATE OF ADMISSION:  12/26/2017 ADMITTING PHYSICIAN: Amelia Jo, MD  DATE OF DISCHARGE: 12/29/17  PRIMARY CARE PHYSICIAN: Elby Beck, FNP    ADMISSION DIAGNOSIS:  Sepsis, due to unspecified organism ([place]) [A41.9] Community acquired pneumonia, unspecified laterality [J18.9]  DISCHARGE DIAGNOSIS:  Active Problems:   Sepsis ([place])   SECONDARY DIAGNOSIS:   Past Medical History:  Diagnosis Date  . Arthritis   . Atrial fibrillation ([place])   . CHF (congestive heart failure) ([place])   . COPD (chronic obstructive pulmonary disease) ([place])   . Dysrhythmia   . GERD (gastroesophageal reflux disease)   . Hyperlipemia   . Hypertension   . Leukocytosis 01/14/2016  . Prostate cancer ([place])   . Stroke [facility])     HOSPITAL COURSE:   1.Sepsis likely secondary to community-acquired pneumonia. Clinically improving, continue oxygen therapy,IV fluids and started on Zosyn and vancomycin IV.  MRSA PCR negative discontinued vancomycin.  Will discharge patient with Augmentin 1.2 lactic acid level. Blood cultures negative so far and urine culture with Staphylococcus epidermidis which is probably a contaminant  2.Acute hypoxemic respiratory failure secondary to community-acquired pneumonia. Clinically improving wean off oxygen as tolerated  3.CAP,antibiotics,oxygen therapy,nebulizer treatments.  4.Acute COPD exacerbation,secondary to CAP,antibiotics,oxygen therapy,nebulizer treatments.  Clinically much better  5.Paroxysmal atrial fibrillation,currently in sinus rhythm.Continue anticoagulation with Coumadin.  INR 1.45.  Coumadin dose increased to 5 mg repeat PT/INR on 12/31/2017 and results need to be faxed over to primary care physician for further management of Coumadin  6.   chronic anemia-probably from micro bleeding from  Coumadin Patient has received 1 unit of blood transfusion as hemoglobin was 6.8 and today's hemoglobin is at 9.1 no active bleeding.PCP to repeat CBC and if the hemoglobin is trending down further  workup need to be done   generalized weakness PT assessment-pending home health PT, 1+ assistance with mobility, rolling walker    DISCHARGE CONDITIONS:   fair  CONSULTS OBTAINED:     PROCEDURES none  DRUG ALLERGIES:  No Known Allergies  DISCHARGE MEDICATIONS:   Allergies as of 12/29/2017   No Known Allergies     Medication List    TAKE these medications   acetaminophen 325 MG tablet Commonly known as:  TYLENOL Take 2 tablets (650 mg total) by mouth every 6 (six) hours as needed for mild pain (or Fever >/= 101).   albuterol 108 (90 Base) MCG/ACT inhaler Commonly known as:  PROVENTIL HFA;VENTOLIN HFA Inhale 2 puffs into the lungs every 6 (six) hours as needed for wheezing or shortness of breath.   amoxicillin-clavulanate 875-125 MG tablet Commonly known as:  AUGMENTIN Take 1 tablet by mouth every 12 (twelve) hours for 7 days.   BESIVANCE 0.6 % Susp Generic drug:  Besifloxacin HCl Apply to eye.   BREO ELLIPTA 100-25 MCG/INH Aepb Generic drug:  fluticasone furoate-vilanterol Inhale 1 puff into the lungs every morning.   citalopram 20 MG tablet Commonly known as:  CELEXA Take 20 mg by mouth daily.   digoxin 0.125 MG tablet Commonly known as:  LANOXIN Take 1 tablet (0.125 mg total) every morning by mouth.   ferrous sulfate 325 (65 FE) MG tablet Take 325 mg by mouth every morning. Reported on 1/60/1093   folic acid 1 MG tablet Commonly known as:  FOLVITE Take 1 mg by mouth daily.   furosemide 20 MG  tablet Commonly known as:  LASIX Take 1 tablet (20 mg total) every other day by mouth. In the morning   methotrexate 2.5 MG tablet Commonly known as:  RHEUMATREX TAKE 6 TABLETS (15 MG TOTAL) BY MOUTH EVERY 7 (SEVEN) DAYS.   montelukast 10 MG tablet Commonly known  as:  SINGULAIR Take 10 mg by mouth daily.   PRESERVISION AREDS PO Take 1 tablet by mouth 2 (two) times daily.   warfarin 5 MG tablet Commonly known as:  COUMADIN Take 1 tablet (5 mg total) by mouth one time only at 6 PM for 2 days. What changed:  Another medication with the same name was removed. Continue taking this medication, and follow the directions you see here.            Durable Medical Equipment  (From admission, onward)        Start     Ordered   12/29/17 1257  For home use only DME Walker rolling  Once    Comments:  Rolling walker with 5 inch wheels  Question:  Patient needs a walker to treat with the following condition  Answer:  Weak   12/29/17 1257   12/29/17 1257  For home use only DME oxygen  Once    Question Answer Comment  Mode or (Route) Nasal cannula   Liters per Minute 4   Frequency Continuous (stationary and portable oxygen unit needed)   Oxygen conserving device Yes   Oxygen delivery system Gas      12/29/17 1257       DISCHARGE INSTRUCTIONS:  Follow-up with primary care physician in 5-7 days Continue home health and repeat PT/INR on 12/31/2017, results need to be faxed over to primary care physician for further management of Coumadin   DIET:  Cardiac diet  DISCHARGE CONDITION:  Stable  ACTIVITY:  Activity as tolerated  OXYGEN:  Home Oxygen: Yes.     Oxygen Delivery: 4 liters/min via Patient connected to nasal cannula oxygen  DISCHARGE LOCATION:  home   If you experience worsening of your admission symptoms, develop shortness of breath, life threatening emergency, suicidal or homicidal thoughts you must seek medical attention immediately by calling 911 or calling your MD immediately  if symptoms less severe.  You Must read complete instructions/literature along with all the possible adverse reactions/side effects for all the Medicines you take and that have been prescribed to you. Take any new Medicines after you have completely  understood and accpet all the possible adverse reactions/side effects.   Please note  You were cared for by a hospitalist during your hospital stay. If you have any questions about your discharge medications or the care you received while you were in the hospital after you are discharged, you can call the unit and asked to speak with the hospitalist on call if the hospitalist that took care of you is not available. Once you are discharged, your primary care physician will handle any further medical issues. Please note that NO REFILLS for any discharge medications will be authorized once you are discharged, as it is imperative that you return to your primary care physician (or establish a relationship with a primary care physician if you do not have one) for your aftercare needs so that they can reassess your need for medications and monitor your lab values.     Today  Chief Complaint  Patient presents with  . Fever  . Weakness   Patient is feeling much better.  Denies any chest pain  or shortness of breath.  Tolerating diet.  Wants to go home.  Son at bedside.  ROS:  CONSTITUTIONAL: Denies fevers, chills. Denies any fatigue, weakness.  EYES: Denies blurry vision, double vision, eye pain. EARS, NOSE, THROAT: Denies tinnitus, ear pain, hearing loss. RESPIRATORY: Denies cough, wheeze, shortness of breath.  CARDIOVASCULAR: Denies chest pain, palpitations, edema.  GASTROINTESTINAL: Denies nausea, vomiting, diarrhea, abdominal pain. Denies bright red blood per rectum. GENITOURINARY: Denies dysuria, hematuria. ENDOCRINE: Denies nocturia or thyroid problems. HEMATOLOGIC AND LYMPHATIC: Denies easy bruising or bleeding. SKIN: Denies rash or lesion. MUSCULOSKELETAL: Denies pain in neck, back, shoulder, knees, hips or arthritic symptoms.  NEUROLOGIC: Denies paralysis, paresthesias.  PSYCHIATRIC: Denies anxiety or depressive symptoms.   VITAL SIGNS:  Blood pressure 132/70, pulse 72, temperature  97.8 F (36.6 C), temperature source Oral, resp. rate 16, height 5' (1.524 m), weight 59.4 kg (130 lb 15.3 oz), SpO2 100 %.  I/O:    Intake/Output Summary (Last 24 hours) at 12/29/2017 1315 Last data filed at 12/29/2017 0933 Gross per 24 hour  Intake 1730 ml  Output 1050 ml  Net 680 ml    PHYSICAL EXAMINATION:  GENERAL:  84 y.o.-year-old patient lying in the bed with no acute distress.  EYES: Pupils equal, round, reactive to light and accommodation. No scleral icterus. Extraocular muscles intact.  HEENT: Head atraumatic, normocephalic. Oropharynx and nasopharynx clear.  NECK:  Supple, no jugular venous distention. No thyroid enlargement, no tenderness.  LUNGS: Normal breath sounds bilaterally, no wheezing, rales,rhonchi or crepitation. No use of accessory muscles of respiration.  CARDIOVASCULAR: S1, S2 normal. No murmurs, rubs, or gallops.  ABDOMEN: Soft, non-tender, non-distended. Bowel sounds present. No organomegaly or mass.  EXTREMITIES: No pedal edema, cyanosis, or clubbing.  NEUROLOGIC: Cranial nerves II through XII are intact. Muscle strength 5/5 in all extremities. Sensation intact. Gait not checked.  PSYCHIATRIC: The patient is alert and oriented x 3.  SKIN: No obvious rash, lesion, or ulcer.   DATA REVIEW:   CBC Recent Labs  Lab 12/29/17 0756  WBC 15.9*  HGB 9.1*  HCT 27.6*  PLT 153    Chemistries  Recent Labs  Lab 12/26/17 2354  12/29/17 0756  NA 140   < > 138  K 4.1   < > 3.7  CL 108   < > 111  CO2 22   < > 23  GLUCOSE 116*   < > 86  BUN 20   < > 13  CREATININE 1.04   < > 0.76  CALCIUM 8.4*   < > 7.8*  AST 28  --   --   ALT 14*  --   --   ALKPHOS 92  --   --   BILITOT 0.7  --   --    < > = values in this interval not displayed.    Cardiac Enzymes No results for input(s): TROPONINI in the last 168 hours.  Microbiology Results  Results for orders placed or performed during the hospital encounter of 12/26/17  Blood Culture (routine x 2)     Status:  None (Preliminary result)   Collection Time: 12/26/17 11:54 PM  Result Value Ref Range Status   Specimen Description BLOOD RIGHT ANTECUBITAL  Final   Special Requests   Final    BOTTLES DRAWN AEROBIC AND ANAEROBIC Blood Culture results may not be optimal due to an excessive volume of blood received in culture bottles   Culture   Final    NO GROWTH 2 DAYS Performed at  [facility], [address], [place], [place] [address]    Report Status PENDING  Incomplete  Blood Culture (routine x 2)     Status: None (Preliminary result)   Collection Time: 12/26/17 11:54 PM  Result Value Ref Range Status   Specimen Description BLOOD BLOOD RIGHT FOREARM  Final   Special Requests   Final    BOTTLES DRAWN AEROBIC AND ANAEROBIC Blood Culture adequate volume   Culture   Final    NO GROWTH 2 DAYS Performed at [facility], [address] [place], [place] [address]    Report Status PENDING  Incomplete  Urine Culture     Status: Abnormal (Preliminary result)   Collection Time: 12/27/17  3:55 AM  Result Value Ref Range Status   Specimen Description   Final    URINE, RANDOM Performed at [facility], [address] [place], [place] [address]    Special Requests   Final    NONE Performed at [facility], [address], [place], [place] [address]    Culture (A)  Final    50,000 COLONIES/mL STAPHYLOCOCCUS EPIDERMIDIS REPEATING SUSCEPTIBILITIES Performed at [facility], [place] [address], [place], [place] [address]    Report Status PENDING  Incomplete  MRSA PCR Screening     Status: None   Collection Time: 12/27/17 12:33 PM  Result Value Ref Range Status   MRSA by PCR NEGATIVE NEGATIVE Final    Comment:        The GeneXpert MRSA Assay (FDA approved for NASAL specimens only), is one component of a comprehensive MRSA colonization surveillance program. It is not intended to diagnose MRSA infection nor to guide or monitor treatment for MRSA  infections. Performed at [facility], [address] [place], [place] [address]     RADIOLOGY:  Dg Chest Portable 1 View  Result Date: 12/27/2017 CLINICAL DATA:  Generalized weakness, fever, chills and confusion tonight. History of COPD. EXAM: PORTABLE CHEST 1 VIEW COMPARISON:  Chest radiograph June 29, 2017 FINDINGS: Cardiac silhouette is mildly enlarged and unchanged. Calcified aortic knob. Mild chronic interstitial changes confluent in the lung bases similar to prior examination. No pleural effusion. No pneumothorax. Osteopenia. IMPRESSION: Similar interstitial prominence confluent in lung bases concerning for recurrent pneumonia with probable underlying fibrosis/COPD. Aortic Atherosclerosis (ICD10-I70.0). Electronically Signed   By: Elon Alas M.D.   On: 12/27/2017 00:13    EKG:   Orders placed or performed during the hospital encounter of 12/26/17  . ED EKG 12-Lead  . ED EKG 12-Lead  . EKG 12-Lead  . EKG 12-Lead      Management plans discussed with the patient, family and they are in agreement.  CODE STATUS:     Code Status Orders  (From admission, onward)        Start     Ordered   12/27/17 0311  Do not attempt resuscitation (DNR)  Continuous    Question Answer Comment  In the event of cardiac or respiratory ARREST Do not call a "code blue"   In the event of cardiac or respiratory ARREST Do not perform Intubation, CPR, defibrillation or ACLS   In the event of cardiac or respiratory ARREST Use medication by any route, position, wound care, and other measures to relive pain and suffering. May use oxygen, suction and manual treatment of airway obstruction as needed for comfort.      12/27/17 0310    Code Status History    Date Active Date  Inactive Code Status Order ID Comments User Context   06/30/2017 0039 07/03/2017 1915 DNR [id number]  Lance Coon, MD Inpatient   03/22/2017 2206 03/25/2017 1441 DNR [id number]  Ivor Costa, MD ED   10/05/2016 1048  10/07/2016 1534 Full Code [id number]  Hillary Bow, MD ED   06/21/2016 0222 06/26/2016 1939 Partial Code [id number]  Lance Coon, MD Inpatient   02/26/2016 1434 02/27/2016 1503 Partial Code [id number]  Vaughan Basta, MD Inpatient   08/06/2015 0259 08/11/2015 1521 Full Code [id number]  Hower, Aaron Mose, MD ED    Advance Directive Documentation     Most Recent Value  Type of Advance Directive  Healthcare Power of Attorney  Pre-existing out of facility DNR order (yellow form or pink MOST form)  -  "MOST" Form in Place?  -      TOTAL TIME TAKING CARE OF THIS PATIENT: 43 minutes.   Note: This dictation was prepared with Dragon dictation along with smaller phrase technology. Any transcriptional errors that result from this process are unintentional.   @[facility] @  on 12/29/2017 at 1:15 PM  Between 7am to 6pm - Pager - [phone]  After 6pm go to [email or web address] - password [email or web address] [facility]  Office  [phone]  CC: Primary care physician; Elby Beck, FNP

## 2017-12-29 NOTE — Progress Notes (Addendum)
ANTICOAGULATION CONSULT NOTE - Initial Consult  Pharmacy Consult for warfarin dosing Indication: PAF  No Known Allergies  Patient Measurements: Height: 5' (152.4 cm) Weight: 130 lb 15.3 oz (59.4 kg) IBW/kg (Calculated) : 50 Heparin Dosing Weight: n/a  Vital Signs: Temp: 97.6 F (36.4 C) (04/04 0430) Temp Source: Oral (04/04 0430) BP: 106/66 (04/04 0430) Pulse Rate: 64 (04/04 0430)  Labs: Recent Labs    12/27/17 0538 12/28/17 0255 12/29/17 0756  HGB 7.1* 6.8* 9.1*  HCT 22.2* 21.3* 27.6*  PLT 135* 138* 153  LABPROT 22.3* 19.8* 17.5*  INR 1.98 1.70 1.45  CREATININE 1.00 0.92 0.76    Estimated Creatinine Clearance: 48.6 mL/min (by C-G formula based on SCr of 0.76 mg/dL).   Medical History: Past Medical History:  Diagnosis Date  . Arthritis   . Atrial fibrillation ([place])   . CHF (congestive heart failure) ([place])   . COPD (chronic obstructive pulmonary disease) ([place])   . Dysrhythmia   . GERD (gastroesophageal reflux disease)   . Hyperlipemia   . Hypertension   . Leukocytosis 01/14/2016  . Prostate cancer ([place])   . Stroke [facility])     Medications:  Home dose of warfarin 4 mg daily  Assessment: INR slightly subtherapeutic on admission  DATE INR DOSE 4/2 1.98 4 mg 4/3 1.7 4 mg 4/4 1.45  Goal of Therapy:  INR 2-3    Plan:  INR subtherapeutic. Unlikely due to missed or delayed doses at this point. Patient received blood yesterday due to chronic anemia/probable micro-bleed from VKA. If provider believes it is safe to continue anticoagulation I will conservatively increase VKA to 5 mg po daily and monitor INR daily. Due to recent transfusion and anemia I would hesitate to increase VKA dose any more before Sunday and I would not give load/bolus. Pharmacy will continue to follow and adjust.  Laural Benes, Pharm.D., BCPS Clinical Pharmacist 12/29/2017,10:20 AM

## 2017-12-29 NOTE — Progress Notes (Signed)
Physical Therapy Treatment Patient Details Name: Thomas Townsend MRN: [id number] DOB: 1931-08-01 Today's Date: 12/29/2017    History of Present Illness Pt is an 84 year old male admitted from home for sepsis and pneumonia.  PMH includes arthritis, atrial fibrillation, CHF, COPD, HLD and CVA.  Visual barriers to learning documented.    PT Comments    Pt in bed, awaiting discharge.  Agrees to walk.  Bed mobility without assist.  Pt stated he feels good and does not need AD for gait.  Stood with min assist due to balance and was able to walk around nursing unit without device and min assist.  Post/right imbalances requiring assist to prevent fall.  Seated rest on bed while walker was obtained.  He was able to walk around unit with min guard and significantly improved gait.  To commode to void.  Pt on 4 lpm.  Sats remained within limits during first walk but noted to dip into low 80's with second walk but quickly increased with seated rest.  Discussed at length with pt and children regarding use of walker at all times upon discharge and use of oxygen.  Encouraged +1 assist for gait for safety until strength and balance returns.  The voiced understanding.     Follow Up Recommendations  Home health PT , +1 assist with mobility.    Equipment Recommendations  Rolling walker with 5" wheels    Recommendations for Other Services       Precautions / Restrictions Precautions Precautions: Fall Restrictions Weight Bearing Restrictions: No    Mobility  Bed Mobility Overal bed mobility: Independent                Transfers Overall transfer level: Needs assistance Equipment used: Rolling walker (2 wheeled);None Transfers: Sit to/from Stand Sit to Stand: Supervision         General transfer comment: Poor hand placements,  requires verbal cues to correct.  Ambulation/Gait Ambulation/Gait assistance: Min assist;Min guard Ambulation Distance (Feet): 200 Feet Assistive device: Rolling  walker (2 wheeled);None Gait Pattern/deviations: Step-through pattern   Gait velocity interpretation: at or above normal speed for age/gender General Gait Details: Requires RW for safety   Stairs            Wheelchair Mobility    Modified Rankin (Stroke Patients Only)       Balance Overall balance assessment: Needs assistance Sitting-balance support: Feet supported Sitting balance-Leahy Scale: Good     Standing balance support: Bilateral upper extremity supported Standing balance-Leahy Scale: Fair                              Cognition Arousal/Alertness: Awake/alert Behavior During Therapy: WFL for tasks assessed/performed Overall Cognitive Status: Within Functional Limits for tasks assessed                                        Exercises      General Comments        Pertinent Vitals/Pain Pain Assessment: No/denies pain    Home Living                      Prior Function            PT Goals (current goals can now be found in the care plan section) Progress towards PT goals: Progressing toward goals  Frequency    Min 2X/week      PT Plan Current plan remains appropriate    Co-evaluation              AM-PAC PT "6 Clicks" Daily Activity  Outcome Measure  Difficulty turning over in bed (including adjusting bedclothes, sheets and blankets)?: None Difficulty moving from lying on back to sitting on the side of the bed? : None Difficulty sitting down on and standing up from a chair with arms (e.g., wheelchair, bedside commode, etc,.)?: A Little Help needed moving to and from a bed to chair (including a wheelchair)?: A Little Help needed walking in hospital room?: A Little Help needed climbing 3-5 steps with a railing? : A Little 6 Click Score: 20    End of Session Equipment Utilized During Treatment: Gait belt;Oxygen Activity Tolerance: Patient tolerated treatment well Patient left: in bed;with  call bell/phone within reach;with bed alarm set;with family/visitor present         Time: 1100-1126 PT Time Calculation (min) (ACUTE ONLY): 26 min  Charges:  $Gait Training: 23-37 mins                    G Codes:       Chesley Noon, PTA 12/29/17, 11:35 AM

## 2017-12-29 NOTE — Discharge Instructions (Signed)
Follow-up with primary care physician in 5-7 days Continue home health and repeat PT/INR on 12/31/2017, results need to be faxed over to primary care physician for further management of Coumadin

## 2017-12-29 NOTE — Care Management (Signed)
Patient admitted with sepsis.  Patient lives at home with son.  PCP Carlean Purl.  PT has assessed patient and recommends home health and RW.  Patient states that he has a rollator and does not wish to get a standard walker.  Patient also declines all home health services at discharge.  Patient has chronic O2 through [facility].    RNCM updated daughter [name] on patient's wishes and she is in agreement.  Daughter to bring portable tank for discharge.  MD updated on patients wishes.  RNCM signing off

## 2017-12-30 ENCOUNTER — Telehealth: Payer: Self-pay

## 2017-12-30 LAB — URINE CULTURE

## 2017-12-30 NOTE — Telephone Encounter (Signed)
Transition Care Management Follow-up Telephone Call   Date discharged? 12/29/17  (patient is heard of hearing, spoke with daughter (okay per DPR).    How have you been since you were released from the hospital? Per daughter, he is doing okay, just weak.   Do you understand why you were in the hospital? Yes   Do you understand the discharge instructions? Yes   Where were you discharged to? Home, patient refused home health/therapy services.    Items Reviewed:  Medications reviewed: Yes  Allergies reviewed: Yes  Dietary changes reviewed: No changes   Referrals reviewed: no referrals at this time.   Functional Questionnaire:   Activities of Daily Living (ADLs):   He states they are independent in the following: with grooming, showers, dressing, toileting, ambulation ad lib (should use a walker but he refuses).  States they require assistance with the following: daughter or son prepare and bring food. Daughter fills a pill box for patient and he then self-administers.    Any transportation issues/concerns?: no   Any patient concerns? "Not really" (per daughter) She does mention that they have had some difficulties with coumadin management in the past and are currently having Dr. Saralyn Pilar monitor through cardiology.  Due to convenience and increased compliance, we did discuss having him transfer to our coumadin clinic here at [facility] as we are just 2 mins from his house.  Patient's daughter is going to discuss with Dr. Saralyn Pilar on Monday and see if he is okay with transfer for anticoagulation therapy management here.  If so, we will establish him with coumadin clinic on Friday (4/12) when he comes to see Tor Netters, NP for hospital follow up.  I will also follow up with Tor Netters to be sure she is okay with transfer.    Confirmed importance and date/time of follow-up visits scheduled Yes  Provider Appointment booked with Tor Netters, NP 01/06/18.   Confirmed  with patient if condition begins to worsen call PCP or go to the ER.  Patient was given the office number and encouraged to call back with question or concerns.  : Yes

## 2017-12-30 NOTE — Telephone Encounter (Signed)
LM on voicemail (okay per DPR) that I was calling to follow up on him after his recent hospitalization.  Request patient call back as soon as he is able to follow up.  Patient does have appointment with provider on 4/12 at 1045.    Will try again on Monday to complete TCM.

## 2017-12-31 NOTE — Telephone Encounter (Signed)
Would be fine to manage coumadin through our office for patient convenience.

## 2018-01-01 LAB — CULTURE, BLOOD (ROUTINE X 2)
CULTURE: NO GROWTH
CULTURE: NO GROWTH
Special Requests: ADEQUATE

## 2018-01-06 ENCOUNTER — Encounter: Payer: Self-pay | Admitting: Family Medicine

## 2018-01-06 ENCOUNTER — Telehealth: Payer: Self-pay | Admitting: Radiology

## 2018-01-06 ENCOUNTER — Ambulatory Visit (INDEPENDENT_AMBULATORY_CARE_PROVIDER_SITE_OTHER): Payer: Medicare Other | Admitting: Family Medicine

## 2018-01-06 VITALS — BP 100/58 | HR 105 | Temp 97.6°F | Wt 132.0 lb

## 2018-01-06 DIAGNOSIS — I482 Chronic atrial fibrillation, unspecified: Secondary | ICD-10-CM

## 2018-01-06 DIAGNOSIS — B370 Candidal stomatitis: Secondary | ICD-10-CM | POA: Diagnosis not present

## 2018-01-06 DIAGNOSIS — D509 Iron deficiency anemia, unspecified: Secondary | ICD-10-CM

## 2018-01-06 DIAGNOSIS — Z09 Encounter for follow-up examination after completed treatment for conditions other than malignant neoplasm: Secondary | ICD-10-CM | POA: Diagnosis not present

## 2018-01-06 LAB — CBC WITH DIFFERENTIAL/PLATELET
BASOS PCT: 1 % (ref 0.0–3.0)
Basophils Absolute: 0.2 10*3/uL — ABNORMAL HIGH (ref 0.0–0.1)
EOS PCT: 0.5 % (ref 0.0–5.0)
Eosinophils Absolute: 0.1 10*3/uL (ref 0.0–0.7)
HCT: 28.1 % — ABNORMAL LOW (ref 39.0–52.0)
Hemoglobin: 9.3 g/dL — ABNORMAL LOW (ref 13.0–17.0)
LYMPHS ABS: 1.4 10*3/uL (ref 0.7–4.0)
Lymphocytes Relative: 6.2 % — ABNORMAL LOW (ref 12.0–46.0)
MCHC: 33.2 g/dL (ref 30.0–36.0)
MCV: 97.5 fl (ref 78.0–100.0)
MONO ABS: 6.5 10*3/uL — AB (ref 0.1–1.0)
Monocytes Relative: 29.7 % — ABNORMAL HIGH (ref 3.0–12.0)
NEUTROS ABS: 13.7 10*3/uL — AB (ref 1.4–7.7)
NEUTROS PCT: 62.6 % (ref 43.0–77.0)
Platelets: 317 10*3/uL (ref 150.0–400.0)
RBC: 2.88 Mil/uL — ABNORMAL LOW (ref 4.22–5.81)
RDW: 18.6 % — AB (ref 11.5–15.5)
WBC: 21.8 10*3/uL (ref 4.0–10.5)

## 2018-01-06 MED ORDER — NYSTATIN 100000 UNIT/ML MT SUSP: OROMUCOSAL | 0 refills | Status: DC

## 2018-01-06 NOTE — Patient Instructions (Addendum)
Please take furosemide for leg swelling as needed.   I have sent a medication to your pharmacy for the yeast on your tongue. You want to swish around in your mouth four times a day  Please schedule a follow up visit with me for 3 months  Please schedule a coumadin visit with Leafy Ro for 1 month   Oral Thrush, Adult Oral thrush is an infection in your mouth and throat. It causes white patches on your tongue and in your mouth. Follow these instructions at home: Helping with soreness  To lessen your pain: ? Drink cold liquids, like water and iced tea. ? Eat frozen ice pops or frozen juices. ? Eat foods that are easy to swallow, like gelatin and ice cream. ? Drink from a straw if the patches in your mouth are painful. General instructions   Take or use over-the-counter and prescription medicines only as told by your doctor. Medicine for oral thrush may be something to swallow, or it may be something to put on the infected area.  Eat plain yogurt that has live cultures in it. Read the label to make sure.  If you wear dentures: ? Take out your dentures before you go to bed. ? Brush them well. ? Soak them in a denture cleaner.  Rinse your mouth with warm salt-water many times a day. To make the salt-water mixture, completely dissolve 1/2-1 teaspoon of salt in 1 cup of warm water. Contact a doctor if:  Your problems are getting worse.  Your problems do not get better in less than 7 days with treatment.  Your infection is spreading. This may show as white patches on the skin outside of your mouth.  You are nursing your baby and you have redness and pain in the nipples. This information is not intended to replace advice given to you by your health care provider. Make sure you discuss any questions you have with your health care provider. Document Released: 12/08/2009 Document Revised: 06/07/2016 Document Reviewed: 06/07/2016 Elsevier Interactive Patient Education  2017 [employer].

## 2018-01-06 NOTE — Progress Notes (Signed)
Subjective:    Patient ID: Thomas Townsend, male    DOB: 08/29/1931, 84 y.o.   MRN: [id number]  HPI This is an 84 yo male who presents today for hospital follow up. He was admitted 4/1-12/29/2017 with community acquired pneumonia, sepsis (negative blood cultures). Treated with IV vancomycin, Zosyn. Vancomycin discontinued after MSRA PCR returned negative. [place] home on Augmentin. Has completed. He received 1 unit of blood for anemia.   Today, he reports that he is feeling ok, getting his strength back. Only concern is irritating white areas on his tongue. He reports this started prior to hospitalization, he does not recall any treatment for this. He denies pain, SOB, weakness, dizziness, falls. Appetite good. No diarrhea/constipation, difficulty with urination. No blood in BM or urine.   He has been going to [facility] cardiology for coumadin management, has spoken to RN here about managing coumadin as location is much closer.   Past Medical History:  Diagnosis Date  . Arthritis   . Atrial fibrillation ([place])   . CHF (congestive heart failure) ([place])   . COPD (chronic obstructive pulmonary disease) ([place])   . Dysrhythmia   . GERD (gastroesophageal reflux disease)   . Hyperlipemia   . Hypertension   . Leukocytosis 01/14/2016  . Prostate cancer ([place])   . Stroke [facility])    Past Surgical History:  Procedure Laterality Date  . CHOLECYSTECTOMY    . COLONOSCOPY WITH PROPOFOL N/A 04/04/2015   Procedure: COLONOSCOPY WITH PROPOFOL;  Surgeon: Manya Silvas, MD;  Location: [facility] ENDOSCOPY;  Service: Endoscopy;  Laterality: N/A;  . ESOPHAGOGASTRODUODENOSCOPY N/A 04/04/2015   Procedure: ESOPHAGOGASTRODUODENOSCOPY (EGD);  Surgeon: Manya Silvas, MD;  Location: [facility] ENDOSCOPY;  Service: Endoscopy;  Laterality: N/A;  . EYE SURGERY     Family History  Problem Relation Age of Onset  . Hypertension Other    Social History   Tobacco Use  . Smoking status: Former Smoker    Years: 20.00    Types:  Cigarettes  . Smokeless tobacco: Never Used  Substance Use Topics  . Alcohol use: Yes    Alcohol/week: 0.6 oz    Types: 1 Cans of beer per week    Comment: Wine every once in a while  . Drug use: No        Review of Systems Per HPI    Objective:   Physical Exam  Constitutional: He is oriented to person, place, and time. He appears well-developed and well-nourished. No distress.  HENT:  Head: Normocephalic and atraumatic.  Mouth/Throat: Mucous membranes are pale.  Tongue with white coating.   Eyes: Conjunctivae are normal.  Cardiovascular: Normal rate. An irregular rhythm present.  No murmur heard. Pulmonary/Chest: Effort normal and breath sounds normal.  Musculoskeletal: He exhibits no edema.  Neurological: He is alert and oriented to person, place, and time.  Skin: Skin is warm and dry. He is not diaphoretic. There is pallor.  Psychiatric: He has a normal mood and affect. His behavior is normal. Judgment and thought content normal.  Vitals reviewed.    BP (!) 100/58   Pulse (!) 105   Temp 97.6 F (36.4 C) (Oral)   Wt 132 lb (59.9 kg)   SpO2 93%   BMI 25.78 kg/m  BP Readings from Last 3 Encounters:  01/06/18 (!) 100/58  12/29/17 132/70  10/03/17 94/60   Wt Readings from Last 3 Encounters:  01/06/18 132 lb (59.9 kg)  12/28/17 130 lb 15.3 oz (59.4 kg)  10/03/17 137 lb (62.1  kg)        Assessment & Plan:  1. Hospital discharge follow-up - reviewed hospital course, medications, instructions  2. Chronic atrial fibrillation (HCC) - INR - will follow up with RN in office for monitoring in 4 weeks if INR normal  3. Oral thrush - nystatin (MYCOSTATIN) 100000 UNIT/ML suspension; Swish one teaspoon around mouth four times a day for 7-14 days.  Dispense: 473 mL; Refill: 0  4. Iron deficiency anemia, unspecified iron deficiency anemia type - CBC with Differential - follow up in 3 months   Clarene Reamer, FNP-BC  [facility] [place]  01/07/2018 9:43 AM

## 2018-01-06 NOTE — Telephone Encounter (Signed)
[facility] called a critical WBC - 21.8, results given to Tor Netters, NP

## 2018-01-06 NOTE — Telephone Encounter (Signed)
Patient saw Tor Netters, NP today.  He is set up for coumadin appointment to establish in 4 weeks per D. Gessner's, NP recommendations.  Per NP last, recent INR was WNL and is okay to continue same dose and recheck in 4 weeks.  She will collect an INR today  to ensure stability.

## 2018-01-07 ENCOUNTER — Encounter: Payer: Self-pay | Admitting: Family Medicine

## 2018-01-09 NOTE — Addendum Note (Signed)
Addended by: Clarene Reamer B on: 01/09/2018 08:13 AM   Modules accepted: Orders

## 2018-01-09 NOTE — Telephone Encounter (Signed)
Noted  

## 2018-01-10 ENCOUNTER — Telehealth: Payer: Self-pay

## 2018-01-10 NOTE — Telephone Encounter (Signed)
LM (okay per DPR) for both patient and daughter to call and set up a coumadin check appt for patient this week.  Per Tor Netters, NP INR was not collected on Friday due to lab order confusion and will need a visit this week.

## 2018-01-11 ENCOUNTER — Ambulatory Visit (INDEPENDENT_AMBULATORY_CARE_PROVIDER_SITE_OTHER): Payer: Medicare Other

## 2018-01-11 DIAGNOSIS — Z7901 Long term (current) use of anticoagulants: Secondary | ICD-10-CM | POA: Diagnosis not present

## 2018-01-11 DIAGNOSIS — I4891 Unspecified atrial fibrillation: Secondary | ICD-10-CM

## 2018-01-11 DIAGNOSIS — I482 Chronic atrial fibrillation, unspecified: Secondary | ICD-10-CM

## 2018-01-11 LAB — POCT INR: INR: 2.6

## 2018-01-11 NOTE — Patient Instructions (Signed)
INR today 2.6   Continue taking 1 pill (4mg ) daily and Recheck in 4 weeks.    Son present with patient, both verbalize understanding of instructions.  Patient is currently doing well and states being off of any antibiotic therapy.

## 2018-01-13 NOTE — Progress Notes (Signed)
I have reviewed this visit and I agree on the patient's plan of dosage and recommendations. Deborah B Gessner, FNP   

## 2018-01-18 ENCOUNTER — Inpatient Hospital Stay: Payer: Medicare Other | Attending: Internal Medicine

## 2018-01-18 ENCOUNTER — Inpatient Hospital Stay: Payer: Medicare Other | Admitting: Internal Medicine

## 2018-01-18 ENCOUNTER — Other Ambulatory Visit: Payer: Self-pay

## 2018-01-18 ENCOUNTER — Encounter: Payer: Self-pay | Admitting: Internal Medicine

## 2018-01-18 VITALS — BP 110/70 | HR 88 | Temp 97.9°F | Resp 18

## 2018-01-18 DIAGNOSIS — C946 Myelodysplastic disease, not classified: Secondary | ICD-10-CM

## 2018-01-18 DIAGNOSIS — I4891 Unspecified atrial fibrillation: Secondary | ICD-10-CM

## 2018-01-18 DIAGNOSIS — C61 Malignant neoplasm of prostate: Secondary | ICD-10-CM

## 2018-01-18 DIAGNOSIS — D72829 Elevated white blood cell count, unspecified: Secondary | ICD-10-CM | POA: Diagnosis not present

## 2018-01-18 DIAGNOSIS — R0602 Shortness of breath: Secondary | ICD-10-CM | POA: Insufficient documentation

## 2018-01-18 DIAGNOSIS — D649 Anemia, unspecified: Secondary | ICD-10-CM | POA: Insufficient documentation

## 2018-01-18 DIAGNOSIS — I509 Heart failure, unspecified: Secondary | ICD-10-CM | POA: Insufficient documentation

## 2018-01-18 DIAGNOSIS — D471 Chronic myeloproliferative disease: Secondary | ICD-10-CM

## 2018-01-18 LAB — CBC WITH DIFFERENTIAL/PLATELET
BASOS ABS: 0.1 10*3/uL (ref 0–0.1)
BASOS PCT: 1 %
Eosinophils Absolute: 0.1 10*3/uL (ref 0–0.7)
Eosinophils Relative: 1 %
HEMATOCRIT: 29.8 % — AB (ref 40.0–52.0)
HEMOGLOBIN: 9.8 g/dL — AB (ref 13.0–18.0)
Lymphocytes Relative: 9 %
Lymphs Abs: 1.1 10*3/uL (ref 1.0–3.6)
MCH: 32 pg (ref 26.0–34.0)
MCHC: 32.9 g/dL (ref 32.0–36.0)
MCV: 97.5 fL (ref 80.0–100.0)
MONOS PCT: 15 %
Monocytes Absolute: 1.8 10*3/uL — ABNORMAL HIGH (ref 0.2–1.0)
NEUTROS ABS: 9 10*3/uL — AB (ref 1.4–6.5)
NEUTROS PCT: 74 %
Platelets: 214 10*3/uL (ref 150–440)
RBC: 3.05 MIL/uL — AB (ref 4.40–5.90)
RDW: 18.7 % — ABNORMAL HIGH (ref 11.5–14.5)
WBC: 12.1 10*3/uL — AB (ref 3.8–10.6)

## 2018-01-18 LAB — COMPREHENSIVE METABOLIC PANEL
ALK PHOS: 77 U/L (ref 38–126)
ALT: 10 U/L — ABNORMAL LOW (ref 17–63)
ANION GAP: 7 (ref 5–15)
AST: 19 U/L (ref 15–41)
Albumin: 3.7 g/dL (ref 3.5–5.0)
BUN: 22 mg/dL — ABNORMAL HIGH (ref 6–20)
CALCIUM: 8.8 mg/dL — AB (ref 8.9–10.3)
CO2: 25 mmol/L (ref 22–32)
Chloride: 104 mmol/L (ref 101–111)
Creatinine, Ser: 0.87 mg/dL (ref 0.61–1.24)
Glucose, Bld: 142 mg/dL — ABNORMAL HIGH (ref 65–99)
Potassium: 3.4 mmol/L — ABNORMAL LOW (ref 3.5–5.1)
SODIUM: 136 mmol/L (ref 135–145)
Total Bilirubin: 0.6 mg/dL (ref 0.3–1.2)
Total Protein: 7.3 g/dL (ref 6.5–8.1)

## 2018-01-18 LAB — FERRITIN: FERRITIN: 508 ng/mL — AB (ref 24–336)

## 2018-01-18 LAB — IRON AND TIBC
IRON: 77 ug/dL (ref 45–182)
SATURATION RATIOS: 42 % — AB (ref 17.9–39.5)
TIBC: 184 ug/dL — AB (ref 250–450)
UIBC: 107 ug/dL

## 2018-01-18 LAB — PSA: Prostatic Specific Antigen: <0.01 ng/mL (ref 0.00–4.00)

## 2018-01-18 NOTE — Progress Notes (Signed)
[facility] OFFICE PROGRESS NOTE  Patient Care Team: Elby Beck, FNP as PCP - General (Nurse Practitioner) Allyne Gee, MD (Internal Medicine)   SUMMARY OF HEMATOLOGIC/ONCOLOGIC HISTORY:  # Leucocytosis- 20-30K [Dr.Pandit-BMBx 2013-No definitive features of a Myeloproliferative neoplasia including CMML, normal cytogenetics (71 XY. Normocellular to focally mildly hypocellular marrow for age (30-50%) with trilineage hematopoiesis, no overt monocytosis ; Flow study unremarkable. Peripheral blood flow- suggestive of chronic myelomonocytic leukemia]; JAK2/Bcr-Abl-NEG.2013- [place]- No hepato-splenomegaly.   # Prostate cancer [early stage] f/u Dr.Tennenbaum [place]- on surveillance  # IDA- ? Etiology.  # RA on MXT; prednisone 5mg  discon;    INTERVAL HISTORY:  A very pleasant 84 -year-old male patient with a prior history of long-standing leukocytosis and mild anemia is here for follow-up.  He is accompanied by his daughter.  Patient denies any significant fatigue.  Denies any weight loss or loss of appetite.  Denies any night sweats or fevers.  Patient has chronic mild shortness of but not any worse.  No blood in stools black or stools.  REVIEW OF SYSTEMS:  A complete 10 point review of system is done which is negative except mentioned above/history of present illness.   PAST MEDICAL HISTORY :  Past Medical History:  Diagnosis Date  . Arthritis   . Atrial fibrillation ([place])   . CHF (congestive heart failure) ([place])   . COPD (chronic obstructive pulmonary disease) ([place])   . Dysrhythmia   . GERD (gastroesophageal reflux disease)   . Hyperlipemia   . Hypertension   . Leukocytosis 01/14/2016  . Prostate cancer ([place])   . Stroke [facility])     PAST SURGICAL HISTORY :   Past Surgical History:  Procedure Laterality Date  . CHOLECYSTECTOMY    . COLONOSCOPY WITH PROPOFOL N/A 04/04/2015   Procedure: COLONOSCOPY WITH PROPOFOL;  Surgeon: Manya Silvas, MD;  Location: [facility]  ENDOSCOPY;  Service: Endoscopy;  Laterality: N/A;  . ESOPHAGOGASTRODUODENOSCOPY N/A 04/04/2015   Procedure: ESOPHAGOGASTRODUODENOSCOPY (EGD);  Surgeon: Manya Silvas, MD;  Location: [facility] ENDOSCOPY;  Service: Endoscopy;  Laterality: N/A;  . EYE SURGERY      FAMILY HISTORY :   Family History  Problem Relation Age of Onset  . Hypertension Other     SOCIAL HISTORY:   Social History   Tobacco Use  . Smoking status: Former Smoker    Years: 20.00    Types: Cigarettes  . Smokeless tobacco: Never Used  Substance Use Topics  . Alcohol use: Yes    Alcohol/week: 0.6 oz    Types: 1 Cans of beer per week    Comment: Wine every once in a while  . Drug use: No    ALLERGIES:  has No Known Allergies.  MEDICATIONS:  Current Outpatient Medications  Medication Sig Dispense Refill  . Besifloxacin HCl (BESIVANCE) 0.6 % SUSP Apply to eye.    Marland Kitchen BREO ELLIPTA 100-25 MCG/INH AEPB Inhale 1 puff into the lungs every morning. 60 each 5  . citalopram (CELEXA) 20 MG tablet Take 20 mg by mouth daily.    . digoxin (LANOXIN) 0.125 MG tablet Take 1 tablet (0.125 mg total) every morning by mouth. 90 tablet 1  . ferrous sulfate 325 (65 FE) MG tablet Take 325 mg by mouth every morning. Reported on 1/32/4401    . folic acid (FOLVITE) 1 MG tablet Take 1 mg by mouth daily.    . furosemide (LASIX) 20 MG tablet Take 1 tablet (20 mg total) every other day by mouth. In  the morning 45 tablet 1  . methotrexate (RHEUMATREX) 2.5 MG tablet TAKE 6 TABLETS (15 MG TOTAL) BY MOUTH EVERY 7 (SEVEN) DAYS.  4  . montelukast (SINGULAIR) 10 MG tablet Take 10 mg by mouth daily.     . Multiple Vitamins-Minerals (PRESERVISION AREDS PO) Take 1 tablet by mouth 2 (two) times daily.     Marland Kitchen nystatin (MYCOSTATIN) 100000 UNIT/ML suspension Swish one teaspoon around mouth four times a day for 7-14 days. 473 mL 0  . warfarin (COUMADIN) 4 MG tablet Take 4 mg by mouth one time only at 6 PM.     . acetaminophen (TYLENOL) 325 MG tablet Take 2  tablets (650 mg total) by mouth every 6 (six) hours as needed for mild pain (or Fever >/= 101). (Patient not taking: Reported on 01/18/2018)    . albuterol (PROVENTIL HFA;VENTOLIN HFA) 108 (90 Base) MCG/ACT inhaler Inhale 2 puffs into the lungs every 6 (six) hours as needed for wheezing or shortness of breath. (Patient not taking: Reported on 01/18/2018) 8 g 0   No current facility-administered medications for this visit.     PHYSICAL EXAMINATION: ECOG PERFORMANCE STATUS: \  BP 110/70   Pulse 88   Temp 97.9 F (36.6 C) (Oral)   Resp 18   There were no vitals filed for this visit.  GENERAL: Well-nourished well-developed; Alert, no distress and comfortable.  Accompanied by his daughter EYES: no pallor or icterus OROPHARYNX: no thrush or ulceration; dentures.  NECK: supple, no masses felt LYMPH:  no palpable lymphadenopathy in the cervical, axillary or inguinal regions LUNGS: clear to auscultation and  No wheeze or crackles HEART/CVS:irregular rate & rhythm and no murmurs; No lower extremity edema ABDOMEN:abdomen soft, non-tender and normal bowel sounds Musculoskeletal:no cyanosis of digits and no clubbing  PSYCH: alert & oriented x 3 with fluent speech NEURO: no focal motor/sensory deficits SKIN:  Big bruise on his left arm. LABORATORY DATA:  I have reviewed the data as listed    Component Value Date/Time   NA 136 01/18/2018 1450   NA 140 01/22/2015 0345   K 3.4 (L) 01/18/2018 1450   K 3.7 01/22/2015 0345   CL 104 01/18/2018 1450   CL 106 01/22/2015 0345   CO2 25 01/18/2018 1450   CO2 25 01/22/2015 0345   GLUCOSE 142 (H) 01/18/2018 1450   GLUCOSE 89 01/22/2015 0345   BUN 22 (H) 01/18/2018 1450   BUN 16 01/22/2015 0345   CREATININE 0.87 01/18/2018 1450   CREATININE 0.88 01/22/2015 0345   CALCIUM 8.8 (L) 01/18/2018 1450   CALCIUM 7.7 (L) 01/22/2015 0345   PROT 7.3 01/18/2018 1450   PROT 6.8 01/21/2015 1946   ALBUMIN 3.7 01/18/2018 1450   ALBUMIN 3.3 (L) 01/21/2015 1946    AST 19 01/18/2018 1450   AST 20 01/21/2015 1946   ALT 10 (L) 01/18/2018 1450   ALT 6 (L) 01/21/2015 1946   ALKPHOS 77 01/18/2018 1450   ALKPHOS 86 01/21/2015 1946   BILITOT 0.6 01/18/2018 1450   BILITOT 0.9 01/21/2015 1946   GFRNONAA >60 01/18/2018 1450   GFRNONAA >60 01/22/2015 0345   GFRAA >60 01/18/2018 1450   GFRAA >60 01/22/2015 0345    No results found for: SPEP, UPEP  Lab Results  Component Value Date   WBC 12.1 (H) 01/18/2018   NEUTROABS 9.0 (H) 01/18/2018   HGB 9.8 (L) 01/18/2018   HCT 29.8 (L) 01/18/2018   MCV 97.5 01/18/2018   PLT 214 01/18/2018  Chemistry      Component Value Date/Time   NA 136 01/18/2018 1450   NA 140 01/22/2015 0345   K 3.4 (L) 01/18/2018 1450   K 3.7 01/22/2015 0345   CL 104 01/18/2018 1450   CL 106 01/22/2015 0345   CO2 25 01/18/2018 1450   CO2 25 01/22/2015 0345   BUN 22 (H) 01/18/2018 1450   BUN 16 01/22/2015 0345   CREATININE 0.87 01/18/2018 1450   CREATININE 0.88 01/22/2015 0345      Component Value Date/Time   CALCIUM 8.8 (L) 01/18/2018 1450   CALCIUM 7.7 (L) 01/22/2015 0345   ALKPHOS 77 01/18/2018 1450   ALKPHOS 86 01/21/2015 1946   AST 19 01/18/2018 1450   AST 20 01/21/2015 1946   ALT 10 (L) 01/18/2018 1450   ALT 6 (L) 01/21/2015 1946   BILITOT 0.6 01/18/2018 1450   BILITOT 0.9 01/21/2015 1946         ASSESSMENT & PLAN:   Myeloproliferative neoplasm (HCC) # Leukocytosis predominant neutrophilia- again unclear etiology- 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 Today white count is 12 hemoglobin around 9-10;  platelets normal. Patient is asymptomatic. Continue surveillance at this time.  # Anemia hemoglobin 9-10 likely secondary to above primary bone marrow process. Iron studies -no iron deficiency anemia. Stable. Monitor for now.   #Oral thrush improved.  # /A.fib/CHF controlled- compensated; on Coumadin.  # Prostate cancer early stage; monitor for now.  Work- [phone]/ [name] [daughter/HPOA]  to call this number.   # Follow-up in 3 months with labs. The above plan of care was discussed with the patient and his daughter in detail.   Cammie Sickle, MD 02/07/2018 4:59 PM

## 2018-01-18 NOTE — Assessment & Plan Note (Addendum)
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 Today white count is 12 hemoglobin around 9-10;  platelets normal. Patient is asymptomatic. Continue surveillance at this time.  # Anemia hemoglobin 9-10 likely secondary to above primary bone marrow process. Iron studies -no iron deficiency anemia. Stable. Monitor for now.   #Oral thrush improved.  # /A.fib/CHF controlled- compensated; on Coumadin.  # Prostate cancer early stage; monitor for now.  Work- [phone]/ [name] [daughter/HPOA] to call this number.   # Follow-up in 3 months with labs. The above plan of care was discussed with the patient and his daughter in detail.

## 2018-01-24 DIAGNOSIS — J189 Pneumonia, unspecified organism: Secondary | ICD-10-CM | POA: Diagnosis not present

## 2018-02-07 ENCOUNTER — Ambulatory Visit: Payer: Medicare Other

## 2018-02-09 ENCOUNTER — Ambulatory Visit: Payer: Medicare Other

## 2018-02-14 ENCOUNTER — Ambulatory Visit (INDEPENDENT_AMBULATORY_CARE_PROVIDER_SITE_OTHER): Payer: Medicare Other

## 2018-02-14 DIAGNOSIS — I4891 Unspecified atrial fibrillation: Secondary | ICD-10-CM

## 2018-02-14 DIAGNOSIS — Z7901 Long term (current) use of anticoagulants: Secondary | ICD-10-CM

## 2018-02-14 DIAGNOSIS — I482 Chronic atrial fibrillation, unspecified: Secondary | ICD-10-CM

## 2018-02-14 LAB — POCT INR: INR: 3.2 — AB (ref 2.0–3.0)

## 2018-02-14 NOTE — Patient Instructions (Signed)
INR today 3.2   Hold Warfarin today (5/21) and then continue taking 1 pill (4mg ) daily and Recheck in 2 weeks.    Son present with patient, both verbalize understanding of instructions.  Patient is currently doing well and states being off of any antibiotic therapy.  Patient is not using a pill box any longer, family states that confused him more than taking straight from the bottle.  Patient was able to verbalize back instructions correctly to me.

## 2018-02-23 DIAGNOSIS — J189 Pneumonia, unspecified organism: Secondary | ICD-10-CM | POA: Diagnosis not present

## 2018-02-25 ENCOUNTER — Telehealth: Payer: Self-pay | Admitting: Family Medicine

## 2018-02-25 NOTE — Progress Notes (Signed)
I have reviewed this visit and I agree on the patient's plan of dosage and recommendations. Deborah B Gessner, FNP   

## 2018-02-26 NOTE — Telephone Encounter (Signed)
Entered in error

## 2018-02-28 ENCOUNTER — Ambulatory Visit (INDEPENDENT_AMBULATORY_CARE_PROVIDER_SITE_OTHER): Payer: Medicare Other

## 2018-02-28 ENCOUNTER — Other Ambulatory Visit (INDEPENDENT_AMBULATORY_CARE_PROVIDER_SITE_OTHER): Payer: Medicare Other

## 2018-02-28 DIAGNOSIS — I482 Chronic atrial fibrillation, unspecified: Secondary | ICD-10-CM

## 2018-02-28 DIAGNOSIS — Z7901 Long term (current) use of anticoagulants: Secondary | ICD-10-CM

## 2018-02-28 DIAGNOSIS — D509 Iron deficiency anemia, unspecified: Secondary | ICD-10-CM | POA: Diagnosis not present

## 2018-02-28 DIAGNOSIS — I4891 Unspecified atrial fibrillation: Secondary | ICD-10-CM

## 2018-02-28 LAB — CBC WITH DIFFERENTIAL/PLATELET
BASOS PCT: 0.8 % (ref 0.0–3.0)
Basophils Absolute: 0.1 10*3/uL (ref 0.0–0.1)
EOS ABS: 0.1 10*3/uL (ref 0.0–0.7)
Eosinophils Relative: 0.8 % (ref 0.0–5.0)
HCT: 31.3 % — ABNORMAL LOW (ref 39.0–52.0)
Hemoglobin: 10.2 g/dL — ABNORMAL LOW (ref 13.0–17.0)
LYMPHS ABS: 1 10*3/uL (ref 0.7–4.0)
MCHC: 32.4 g/dL (ref 30.0–36.0)
MCV: 100.5 fl — AB (ref 78.0–100.0)
Monocytes Absolute: 1.7 10*3/uL — ABNORMAL HIGH (ref 0.1–1.0)
Monocytes Relative: 11.6 % (ref 3.0–12.0)
NEUTROS ABS: 12 10*3/uL — AB (ref 1.4–7.7)
NEUTROS PCT: 80.1 % — AB (ref 43.0–77.0)
PLATELETS: 213 10*3/uL (ref 150.0–400.0)
RBC: 3.11 Mil/uL — ABNORMAL LOW (ref 4.22–5.81)
RDW: 19.5 % — AB (ref 11.5–15.5)
WBC: 14.9 10*3/uL — ABNORMAL HIGH (ref 4.0–10.5)

## 2018-02-28 LAB — POCT INR: INR: 4.5 — AB (ref 2.0–3.0)

## 2018-02-28 NOTE — Patient Instructions (Addendum)
INR today 4.5  Hold Warfarin today (6/4) and tomorrow (6/5) and then decrease dose taking 1 pill (4mg ) daily EXCEPT 1/2 pill (2mg ) on Mondays, Wednesday and Fridays  Recheck in 2 weeks.    Son present with patient, both verbalize understanding of instructions.  Patient is currently doing well.  He does report using nystatin mouthwash which he is swallowing a small amount 2-4 x per day.  This may account for elevation.  Dosage decreased during therapy and will recheck in 2 weeks. Patient is not using a pill box any longer, family states that confused him more than taking straight from the bottle.  Patient and son will work on reviewing safest way for patient to take his medication especially now that he is starting a split dosing today.

## 2018-03-04 NOTE — Progress Notes (Signed)
Agree. Thanks

## 2018-03-05 ENCOUNTER — Inpatient Hospital Stay
Admission: EM | Admit: 2018-03-05 | Discharge: 2018-03-09 | DRG: 871 | Disposition: A | Discharge status: Skilled Nursing Facility | Payer: Medicare Other | Attending: Internal Medicine | Admitting: Internal Medicine

## 2018-03-05 ENCOUNTER — Inpatient Hospital Stay: Payer: Self-pay

## 2018-03-05 ENCOUNTER — Emergency Department: Payer: Medicare Other

## 2018-03-05 ENCOUNTER — Other Ambulatory Visit: Payer: Self-pay

## 2018-03-05 DIAGNOSIS — D72829 Elevated white blood cell count, unspecified: Secondary | ICD-10-CM

## 2018-03-05 DIAGNOSIS — J9690 Respiratory failure, unspecified, unspecified whether with hypoxia or hypercapnia: Secondary | ICD-10-CM | POA: Diagnosis not present

## 2018-03-05 DIAGNOSIS — R197 Diarrhea, unspecified: Secondary | ICD-10-CM

## 2018-03-05 DIAGNOSIS — D539 Nutritional anemia, unspecified: Secondary | ICD-10-CM | POA: Diagnosis not present

## 2018-03-05 DIAGNOSIS — I482 Chronic atrial fibrillation: Secondary | ICD-10-CM | POA: Diagnosis present

## 2018-03-05 DIAGNOSIS — A419 Sepsis, unspecified organism: Secondary | ICD-10-CM | POA: Diagnosis not present

## 2018-03-05 DIAGNOSIS — Z515 Encounter for palliative care: Secondary | ICD-10-CM | POA: Diagnosis not present

## 2018-03-05 DIAGNOSIS — E785 Hyperlipidemia, unspecified: Secondary | ICD-10-CM | POA: Diagnosis not present

## 2018-03-05 DIAGNOSIS — J181 Lobar pneumonia, unspecified organism: Secondary | ICD-10-CM | POA: Diagnosis not present

## 2018-03-05 DIAGNOSIS — J439 Emphysema, unspecified: Secondary | ICD-10-CM | POA: Diagnosis not present

## 2018-03-05 DIAGNOSIS — D696 Thrombocytopenia, unspecified: Secondary | ICD-10-CM | POA: Diagnosis not present

## 2018-03-05 DIAGNOSIS — N179 Acute kidney failure, unspecified: Secondary | ICD-10-CM | POA: Diagnosis not present

## 2018-03-05 DIAGNOSIS — Z7951 Long term (current) use of inhaled steroids: Secondary | ICD-10-CM

## 2018-03-05 DIAGNOSIS — I5032 Chronic diastolic (congestive) heart failure: Secondary | ICD-10-CM | POA: Diagnosis present

## 2018-03-05 DIAGNOSIS — I4892 Unspecified atrial flutter: Secondary | ICD-10-CM | POA: Diagnosis not present

## 2018-03-05 DIAGNOSIS — H3530 Unspecified macular degeneration: Secondary | ICD-10-CM | POA: Diagnosis not present

## 2018-03-05 DIAGNOSIS — R6520 Severe sepsis without septic shock: Secondary | ICD-10-CM | POA: Diagnosis present

## 2018-03-05 DIAGNOSIS — I480 Paroxysmal atrial fibrillation: Secondary | ICD-10-CM | POA: Diagnosis present

## 2018-03-05 DIAGNOSIS — Z66 Do not resuscitate: Secondary | ICD-10-CM | POA: Diagnosis present

## 2018-03-05 DIAGNOSIS — H9190 Unspecified hearing loss, unspecified ear: Secondary | ICD-10-CM | POA: Diagnosis present

## 2018-03-05 DIAGNOSIS — F329 Major depressive disorder, single episode, unspecified: Secondary | ICD-10-CM | POA: Diagnosis present

## 2018-03-05 DIAGNOSIS — J189 Pneumonia, unspecified organism: Secondary | ICD-10-CM | POA: Diagnosis not present

## 2018-03-05 DIAGNOSIS — Y95 Nosocomial condition: Secondary | ICD-10-CM | POA: Diagnosis present

## 2018-03-05 DIAGNOSIS — C9310 Chronic myelomonocytic leukemia not having achieved remission: Secondary | ICD-10-CM | POA: Diagnosis not present

## 2018-03-05 DIAGNOSIS — D630 Anemia in neoplastic disease: Secondary | ICD-10-CM | POA: Diagnosis present

## 2018-03-05 DIAGNOSIS — D500 Iron deficiency anemia secondary to blood loss (chronic): Secondary | ICD-10-CM | POA: Diagnosis present

## 2018-03-05 DIAGNOSIS — I110 Hypertensive heart disease with heart failure: Secondary | ICD-10-CM | POA: Diagnosis present

## 2018-03-05 DIAGNOSIS — R2689 Other abnormalities of gait and mobility: Secondary | ICD-10-CM | POA: Diagnosis not present

## 2018-03-05 DIAGNOSIS — Z7901 Long term (current) use of anticoagulants: Secondary | ICD-10-CM

## 2018-03-05 DIAGNOSIS — M0689 Other specified rheumatoid arthritis, multiple sites: Secondary | ICD-10-CM | POA: Diagnosis present

## 2018-03-05 DIAGNOSIS — Z8249 Family history of ischemic heart disease and other diseases of the circulatory system: Secondary | ICD-10-CM

## 2018-03-05 DIAGNOSIS — J440 Chronic obstructive pulmonary disease with acute lower respiratory infection: Secondary | ICD-10-CM | POA: Diagnosis not present

## 2018-03-05 DIAGNOSIS — I959 Hypotension, unspecified: Secondary | ICD-10-CM

## 2018-03-05 DIAGNOSIS — Z7189 Other specified counseling: Secondary | ICD-10-CM | POA: Diagnosis not present

## 2018-03-05 DIAGNOSIS — I509 Heart failure, unspecified: Secondary | ICD-10-CM | POA: Diagnosis not present

## 2018-03-05 DIAGNOSIS — J9601 Acute respiratory failure with hypoxia: Secondary | ICD-10-CM | POA: Diagnosis present

## 2018-03-05 DIAGNOSIS — Z79899 Other long term (current) drug therapy: Secondary | ICD-10-CM

## 2018-03-05 DIAGNOSIS — R7989 Other specified abnormal findings of blood chemistry: Secondary | ICD-10-CM

## 2018-03-05 DIAGNOSIS — Z8701 Personal history of pneumonia (recurrent): Secondary | ICD-10-CM

## 2018-03-05 DIAGNOSIS — Z8619 Personal history of other infectious and parasitic diseases: Secondary | ICD-10-CM | POA: Diagnosis not present

## 2018-03-05 DIAGNOSIS — Z7401 Bed confinement status: Secondary | ICD-10-CM | POA: Diagnosis not present

## 2018-03-05 DIAGNOSIS — Z8546 Personal history of malignant neoplasm of prostate: Secondary | ICD-10-CM

## 2018-03-05 DIAGNOSIS — Z8673 Personal history of transient ischemic attack (TIA), and cerebral infarction without residual deficits: Secondary | ICD-10-CM

## 2018-03-05 DIAGNOSIS — E876 Hypokalemia: Secondary | ICD-10-CM | POA: Diagnosis not present

## 2018-03-05 DIAGNOSIS — Z9049 Acquired absence of other specified parts of digestive tract: Secondary | ICD-10-CM

## 2018-03-05 DIAGNOSIS — M6281 Muscle weakness (generalized): Secondary | ICD-10-CM | POA: Diagnosis not present

## 2018-03-05 DIAGNOSIS — R0902 Hypoxemia: Secondary | ICD-10-CM | POA: Diagnosis not present

## 2018-03-05 DIAGNOSIS — R000 Tachycardia, unspecified: Secondary | ICD-10-CM | POA: Diagnosis not present

## 2018-03-05 DIAGNOSIS — Z87891 Personal history of nicotine dependence: Secondary | ICD-10-CM

## 2018-03-05 DIAGNOSIS — R112 Nausea with vomiting, unspecified: Secondary | ICD-10-CM | POA: Diagnosis not present

## 2018-03-05 DIAGNOSIS — I10 Essential (primary) hypertension: Secondary | ICD-10-CM | POA: Diagnosis not present

## 2018-03-05 LAB — BASIC METABOLIC PANEL
Anion gap: 13 (ref 5–15)
Anion gap: 7 (ref 5–15)
BUN: 22 mg/dL — AB (ref 6–20)
BUN: 21 mg/dL — AB (ref 6–20)
CHLORIDE: 107 mmol/L (ref 101–111)
CHLORIDE: 106 mmol/L (ref 101–111)
CO2: 16 mmol/L — ABNORMAL LOW (ref 22–32)
CO2: 23 mmol/L (ref 22–32)
CREATININE: 0.96 mg/dL (ref 0.61–1.24)
CREATININE: 0.96 mg/dL (ref 0.61–1.24)
Calcium: 7.5 mg/dL — ABNORMAL LOW (ref 8.9–10.3)
Calcium: 7.5 mg/dL — ABNORMAL LOW (ref 8.9–10.3)
GFR calc Af Amer: >60 mL/min (ref >60)
GFR calc Af Amer: >60 mL/min (ref >60)
GFR calc non Af Amer: >60 mL/min (ref >60)
GFR calc non Af Amer: >60 mL/min (ref >60)
GLUCOSE: 104 mg/dL — AB (ref 65–99)
GLUCOSE: 111 mg/dL — AB (ref 65–99)
POTASSIUM: 3.6 mmol/L (ref 3.5–5.1)
Potassium: 3.7 mmol/L (ref 3.5–5.1)
SODIUM: 136 mmol/L (ref 135–145)
SODIUM: 136 mmol/L (ref 135–145)

## 2018-03-05 LAB — CBC
HEMATOCRIT: 24.6 % — AB (ref 40.0–52.0)
HEMOGLOBIN: 8 g/dL — AB (ref 13.0–18.0)
MCH: 32.6 pg (ref 26.0–34.0)
MCHC: 32.6 g/dL (ref 32.0–36.0)
MCV: 100 fL (ref 80.0–100.0)
Platelets: 135 10*3/uL — ABNORMAL LOW (ref 150–440)
RBC: 2.46 MIL/uL — AB (ref 4.40–5.90)
RDW: 18.9 % — ABNORMAL HIGH (ref 11.5–14.5)
WBC: 53.7 10*3/uL — AB (ref 3.8–10.6)

## 2018-03-05 LAB — CBC WITH DIFFERENTIAL/PLATELET
BAND NEUTROPHILS: 7 %
BASOS ABS: 0 10*3/uL (ref 0–0.1)
BASOS PCT: 0 %
Blasts: 0 %
EOS ABS: 0 10*3/uL (ref 0–0.7)
Eosinophils Relative: 0 %
HCT: 30.5 % — ABNORMAL LOW (ref 40.0–52.0)
HEMOGLOBIN: 9.7 g/dL — AB (ref 13.0–18.0)
Lymphocytes Relative: 3 %
Lymphs Abs: 2.3 10*3/uL (ref 1.0–3.6)
MCH: 32.4 pg (ref 26.0–34.0)
MCHC: 32 g/dL (ref 32.0–36.0)
MCV: 101.4 fL — ABNORMAL HIGH (ref 80.0–100.0)
METAMYELOCYTES PCT: 0 %
MONO ABS: 13 10*3/uL — AB (ref 0.2–1.0)
Monocytes Relative: 17 %
Myelocytes: 1 %
Neutro Abs: 61.4 10*3/uL — ABNORMAL HIGH (ref 1.4–6.5)
Neutrophils Relative %: 72 %
Other: 0 %
PROMYELOCYTES RELATIVE: 0 %
Platelets: 163 10*3/uL (ref 150–440)
RBC: 3.01 MIL/uL — ABNORMAL LOW (ref 4.40–5.90)
RDW: 19.7 % — ABNORMAL HIGH (ref 11.5–14.5)
WBC: 76.7 10*3/uL (ref 3.8–10.6)
nRBC: 0 /100 WBC

## 2018-03-05 LAB — TROPONIN I: TROPONIN I: 0.03 ng/mL — AB (ref <0.03)

## 2018-03-05 LAB — URINALYSIS, ROUTINE W REFLEX MICROSCOPIC
Bilirubin Urine: NEGATIVE
Glucose, UA: NEGATIVE mg/dL
HGB URINE DIPSTICK: NEGATIVE
Ketones, ur: NEGATIVE mg/dL
Leukocytes, UA: NEGATIVE
Nitrite: NEGATIVE
Protein, ur: NEGATIVE mg/dL
SPECIFIC GRAVITY, URINE: 1.012 (ref 1.005–1.030)
pH: 5 (ref 5.0–8.0)

## 2018-03-05 LAB — COMPREHENSIVE METABOLIC PANEL
ALBUMIN: 3.4 g/dL — AB (ref 3.5–5.0)
ALK PHOS: 78 U/L (ref 38–126)
ALT: 15 U/L — AB (ref 17–63)
AST: 37 U/L (ref 15–41)
Anion gap: 11 (ref 5–15)
BILIRUBIN TOTAL: 1.2 mg/dL (ref 0.3–1.2)
BUN: 23 mg/dL — ABNORMAL HIGH (ref 6–20)
CALCIUM: 7.8 mg/dL — AB (ref 8.9–10.3)
CO2: 20 mmol/L — ABNORMAL LOW (ref 22–32)
CREATININE: 1.17 mg/dL (ref 0.61–1.24)
Chloride: 105 mmol/L (ref 101–111)
GFR calc Af Amer: >60 mL/min (ref >60)
GFR calc non Af Amer: 55 mL/min — ABNORMAL LOW (ref >60)
GLUCOSE: 107 mg/dL — AB (ref 65–99)
Potassium: 3.2 mmol/L — ABNORMAL LOW (ref 3.5–5.1)
Sodium: 136 mmol/L (ref 135–145)
Total Protein: 6.4 g/dL — ABNORMAL LOW (ref 6.5–8.1)

## 2018-03-05 LAB — PROTIME-INR
INR: 1.61
INR: 2.02
PROTHROMBIN TIME: 22.7 s — AB (ref 11.4–15.2)
Prothrombin Time: 19 seconds — ABNORMAL HIGH (ref 11.4–15.2)

## 2018-03-05 LAB — MAGNESIUM
MAGNESIUM: 1.5 mg/dL — AB (ref 1.7–2.4)
MAGNESIUM: 2.2 mg/dL (ref 1.7–2.4)

## 2018-03-05 LAB — FIBRINOGEN: Fibrinogen: 475 mg/dL (ref 210–475)

## 2018-03-05 LAB — LACTIC ACID, PLASMA
LACTIC ACID, VENOUS: 4.4 mmol/L — AB (ref 0.5–1.9)
Lactic Acid, Venous: 3.1 mmol/L (ref 0.5–1.9)
Lactic Acid, Venous: 1.6 mmol/L (ref 0.5–1.9)

## 2018-03-05 LAB — FIBRIN DERIVATIVES D-DIMER (ARMC ONLY): FIBRIN DERIVATIVES D-DIMER (ARMC): 1133.79 ng{FEU}/mL — AB (ref 0.00–499.00)

## 2018-03-05 LAB — GLUCOSE, CAPILLARY: GLUCOSE-CAPILLARY: 124 mg/dL — AB (ref 65–99)

## 2018-03-05 LAB — PHOSPHORUS: Phosphorus: 2.5 mg/dL (ref 2.5–4.6)

## 2018-03-05 LAB — PROCALCITONIN: PROCALCITONIN: 25.34 ng/mL

## 2018-03-05 LAB — MRSA PCR SCREENING: MRSA by PCR: NEGATIVE

## 2018-03-05 MED ORDER — VANCOMYCIN HCL IN DEXTROSE 1-5 GM/200ML-% IV SOLN
1000.0000 mg | Freq: Once | INTRAVENOUS | Status: AC
  Administered 2018-03-05: 1000 mg via INTRAVENOUS
  Filled 2018-03-05: qty 200

## 2018-03-05 MED ORDER — FERROUS SULFATE 325 (65 FE) MG PO TABS
325.0000 mg | ORAL_TABLET | Freq: Every morning | ORAL | Status: DC
  Administered 2018-03-05 – 2018-03-09 (×5): 325 mg via ORAL
  Filled 2018-03-05 (×5): qty 1

## 2018-03-05 MED ORDER — LACTATED RINGERS IV BOLUS (SEPSIS)
1000.0000 mL | Freq: Once | INTRAVENOUS | Status: AC
  Administered 2018-03-05: 1000 mL via INTRAVENOUS
  Filled 2018-03-05: qty 1000

## 2018-03-05 MED ORDER — FOLIC ACID 1 MG PO TABS
1.0000 mg | ORAL_TABLET | Freq: Every day | ORAL | Status: DC
  Administered 2018-03-05 – 2018-03-09 (×5): 1 mg via ORAL
  Filled 2018-03-05 (×5): qty 1

## 2018-03-05 MED ORDER — SODIUM CHLORIDE 0.9 % IV BOLUS
250.0000 mL | Freq: Once | INTRAVENOUS | Status: AC
  Administered 2018-03-05: 250 mL via INTRAVENOUS

## 2018-03-05 MED ORDER — DIGOXIN 125 MCG PO TABS
0.1250 mg | ORAL_TABLET | ORAL | Status: DC
  Administered 2018-03-05 – 2018-03-09 (×5): 0.125 mg via ORAL
  Filled 2018-03-05 (×5): qty 1

## 2018-03-05 MED ORDER — HYDROCODONE-ACETAMINOPHEN 5-325 MG PO TABS: 1.0000 | ORAL_TABLET | ORAL | Status: DC | PRN

## 2018-03-05 MED ORDER — MAGNESIUM SULFATE 2 GM/50ML IV SOLN
2.0000 g | Freq: Once | INTRAVENOUS | Status: AC
  Administered 2018-03-05: 2 g via INTRAVENOUS
  Filled 2018-03-05: qty 50

## 2018-03-05 MED ORDER — WARFARIN SODIUM 4 MG PO TABS
4.0000 mg | ORAL_TABLET | Freq: Once | ORAL | Status: AC
  Administered 2018-03-05: 4 mg via ORAL
  Filled 2018-03-05: qty 1

## 2018-03-05 MED ORDER — SODIUM CHLORIDE 0.9 % IV SOLN
INTRAVENOUS | Status: DC
  Administered 2018-03-05: 12:00:00 via INTRAVENOUS

## 2018-03-05 MED ORDER — POTASSIUM CHLORIDE CRYS ER 20 MEQ PO TBCR
40.0000 meq | EXTENDED_RELEASE_TABLET | Freq: Once | ORAL | Status: AC
  Administered 2018-03-05: 40 meq via ORAL
  Filled 2018-03-05: qty 2

## 2018-03-05 MED ORDER — ACETAMINOPHEN 325 MG PO TABS
650.0000 mg | ORAL_TABLET | Freq: Four times a day (QID) | ORAL | Status: DC | PRN
  Administered 2018-03-07: 650 mg via ORAL
  Filled 2018-03-05: qty 2

## 2018-03-05 MED ORDER — FLUTICASONE FUROATE-VILANTEROL 100-25 MCG/INH IN AEPB
1.0000 | INHALATION_SPRAY | RESPIRATORY_TRACT | Status: DC
  Administered 2018-03-05 – 2018-03-09 (×5): 1 via RESPIRATORY_TRACT
  Filled 2018-03-05 (×2): qty 28

## 2018-03-05 MED ORDER — SODIUM CHLORIDE 0.9 % IV SOLN
2.0000 g | Freq: Once | INTRAVENOUS | Status: AC
  Administered 2018-03-05: 2 g via INTRAVENOUS
  Filled 2018-03-05: qty 2

## 2018-03-05 MED ORDER — LACTATED RINGERS IV BOLUS (SEPSIS)
500.0000 mL | Freq: Once | INTRAVENOUS | Status: AC
  Administered 2018-03-05: 500 mL via INTRAVENOUS
  Filled 2018-03-05: qty 500

## 2018-03-05 MED ORDER — SODIUM CHLORIDE 0.9 % IV SOLN
2.0000 g | INTRAVENOUS | Status: DC
  Administered 2018-03-06: 2 g via INTRAVENOUS
  Filled 2018-03-05: qty 2

## 2018-03-05 MED ORDER — IPRATROPIUM-ALBUTEROL 0.5-2.5 (3) MG/3ML IN SOLN
3.0000 mL | Freq: Four times a day (QID) | RESPIRATORY_TRACT | Status: DC
  Administered 2018-03-05 – 2018-03-06 (×3): 3 mL via RESPIRATORY_TRACT
  Filled 2018-03-05 (×2): qty 3

## 2018-03-05 MED ORDER — SODIUM CHLORIDE 0.9 % IV BOLUS
1000.0000 mL | Freq: Once | INTRAVENOUS | Status: AC
  Administered 2018-03-05: 1000 mL via INTRAVENOUS

## 2018-03-05 MED ORDER — IPRATROPIUM-ALBUTEROL 0.5-2.5 (3) MG/3ML IN SOLN
RESPIRATORY_TRACT | Status: AC
  Filled 2018-03-05: qty 3

## 2018-03-05 MED ORDER — METHOTREXATE 2.5 MG PO TABS
2.5000 mg | ORAL_TABLET | ORAL | Status: DC
  Administered 2018-03-05: 2.5 mg via ORAL
  Filled 2018-03-05: qty 1

## 2018-03-05 MED ORDER — MONTELUKAST SODIUM 10 MG PO TABS
10.0000 mg | ORAL_TABLET | Freq: Every day | ORAL | Status: DC
  Administered 2018-03-05 – 2018-03-06 (×2): 10 mg via ORAL
  Filled 2018-03-05 (×2): qty 1

## 2018-03-05 MED ORDER — ACETAMINOPHEN 650 MG RE SUPP: 650.0000 mg | Freq: Four times a day (QID) | RECTAL | Status: DC | PRN

## 2018-03-05 MED ORDER — ONDANSETRON HCL 4 MG PO TABS: 4.0000 mg | ORAL_TABLET | Freq: Four times a day (QID) | ORAL | Status: DC | PRN

## 2018-03-05 MED ORDER — SODIUM CHLORIDE 0.9 % IV BOLUS: 250.0000 mL | Freq: Once | INTRAVENOUS | Status: AC

## 2018-03-05 MED ORDER — ONDANSETRON HCL 4 MG/2ML IJ SOLN
4.0000 mg | Freq: Four times a day (QID) | INTRAMUSCULAR | Status: DC | PRN
  Administered 2018-03-05 – 2018-03-06 (×2): 4 mg via INTRAVENOUS
  Filled 2018-03-05 (×2): qty 2

## 2018-03-05 MED ORDER — SENNOSIDES-DOCUSATE SODIUM 8.6-50 MG PO TABS: 1.0000 | ORAL_TABLET | Freq: Every evening | ORAL | Status: DC | PRN

## 2018-03-05 MED ORDER — LACTATED RINGERS IV BOLUS (SEPSIS)
250.0000 mL | Freq: Once | INTRAVENOUS | Status: AC
  Administered 2018-03-05: 250 mL via INTRAVENOUS
  Filled 2018-03-05: qty 250

## 2018-03-05 MED ORDER — PHENYLEPHRINE HCL-NACL 10-0.9 MG/250ML-% IV SOLN
0.0000 ug/min | INTRAVENOUS | Status: DC
  Administered 2018-03-06: 10 ug/min via INTRAVENOUS

## 2018-03-05 MED ORDER — WARFARIN - PHARMACIST DOSING INPATIENT
Freq: Every day | Status: DC
  Administered 2018-03-06: 18:00:00

## 2018-03-05 MED ORDER — CITALOPRAM HYDROBROMIDE 20 MG PO TABS
20.0000 mg | ORAL_TABLET | Freq: Every day | ORAL | Status: DC
  Administered 2018-03-05 – 2018-03-09 (×5): 20 mg via ORAL
  Filled 2018-03-05 (×5): qty 1

## 2018-03-05 NOTE — ED Notes (Signed)
Notified lab of the new order for the labs sent already.

## 2018-03-05 NOTE — Consult Note (Addendum)
Reason for Consult:pneumonia/hypotension Referring Physician: Dr. [name] is an 84 y.o. male.  HPI: Thomas Townsend is an 84 year old gentleman with a past medical history remarkable for hypertension, hyperlipidemia, leukocytosis, prostate cancer, CVA, gastroesophageal reflux disease, COPD, CHF, atrial fibrillation who was in normal state of health until yesterday where he developed nausea, vomiting, diarrhea, was found by family covered in feces. He was subsequently brought into the emergency department, his labs were remarkable for white count of 76.7 thousand, platelet count 163, lactic acid of 4.4, he was noted to be hypotensive requiring fluid resuscitation, hypokalemia and prerenal indices with a BUN of 23 and creatinine 1.17. Chest x-ray was remarkable for diffuse multi lobar infiltrates and he is subsequently being admitted to the intensive care unit for treatment of pneumonia and hypotension.  Past Medical History:  Diagnosis Date  . Arthritis   . Atrial fibrillation ([place])   . CHF (congestive heart failure) ([place])   . COPD (chronic obstructive pulmonary disease) ([place])   . Dysrhythmia   . GERD (gastroesophageal reflux disease)   . Hyperlipemia   . Hypertension   . Leukocytosis 01/14/2016  . Prostate cancer ([place])   . Stroke [facility])     Past Surgical History:  Procedure Laterality Date  . CHOLECYSTECTOMY    . COLONOSCOPY WITH PROPOFOL N/A 04/04/2015   Procedure: COLONOSCOPY WITH PROPOFOL;  Surgeon: Manya Silvas, MD;  Location[facility] ENDOSCOPY;  Service: Endoscopy;  Laterality: N/A;  . ESOPHAGOGASTRODUODENOSCOPY N/A 04/04/2015   Procedure: ESOPHAGOGASTRODUODENOSCOPY (EGD);  Surgeon: Manya Silvas, MD;  Location: [facility] ENDOSCOPY;  Service: Endoscopy;  Laterality: N/A;  . EYE SURGERY      Family History  Problem Relation Age of Onset  . Hypertension Other     Social History:  reports that he has quit smoking. His smoking use included cigarettes. He quit after 20.00 years  of use. He has never used smokeless tobacco. He reports that he drinks about 0.6 oz of alcohol per week. He reports that he does not use drugs.  Allergies: No Known Allergies  Medications: I have reviewed the patient's current medications.  Results for orders placed or performed during the hospital encounter of 03/05/18 (from the past 48 hour(s))  Comprehensive metabolic panel     Status: Abnormal   Collection Time: 03/05/18  7:44 AM  Result Value Ref Range   Sodium 136 135 - 145 mmol/L   Potassium 3.2 (L) 3.5 - 5.1 mmol/L   Chloride 105 101 - 111 mmol/L   CO2 20 (L) 22 - 32 mmol/L   Glucose, Bld 107 (H) 65 - 99 mg/dL   BUN 23 (H) 6 - 20 mg/dL   Creatinine, Ser 1.17 0.61 - 1.24 mg/dL   Calcium 7.8 (L) 8.9 - 10.3 mg/dL   Total Protein 6.4 (L) 6.5 - 8.1 g/dL   Albumin 3.4 (L) 3.5 - 5.0 g/dL   AST 37 15 - 41 U/L   ALT 15 (L) 17 - 63 U/L   Alkaline Phosphatase 78 38 - 126 U/L   Total Bilirubin 1.2 0.3 - 1.2 mg/dL   GFR calc non Af Amer 55 (L) >60 mL/min   GFR calc Af Amer >60 >60 mL/min    Comment: (NOTE) The eGFR has been calculated using the CKD EPI equation. This calculation has not been validated in all clinical situations. eGFR's persistently <60 mL/min signify possible Chronic Kidney Disease.    Anion gap 11 5 - 15    Comment: Performed at [facility]  Lab, [place], [place] [address]  CBC WITH DIFFERENTIAL     Status: Abnormal   Collection Time: 03/05/18  7:44 AM  Result Value Ref Range   WBC 76.7 (HH) 3.8 - 10.6 K/uL    Comment: CRITICAL RESULT CALLED TO, READ BACK BY AND VERIFIED WITH: CASSIE [facility] 03/05/18 @ 0836  MLK    RBC 3.01 (L) 4.40 - 5.90 MIL/uL   Hemoglobin 9.7 (L) 13.0 - 18.0 g/dL   HCT 30.5 (L) 40.0 - 52.0 %   MCV 101.4 (H) 80.0 - 100.0 fL   MCH 32.4 26.0 - 34.0 pg   MCHC 32.0 32.0 - 36.0 g/dL   RDW 19.7 (H) 11.5 - 14.5 %   Platelets 163 150 - 440 K/uL   Neutrophils Relative % 72 %   Lymphocytes Relative 3 %   Monocytes Relative 17  %   Eosinophils Relative 0 %   Basophils Relative 0 %   Band Neutrophils 7 %   Metamyelocytes Relative 0 %   Myelocytes 1 %   Promyelocytes Relative 0 %   Blasts 0 %   nRBC 0 0 /100 WBC   Other 0 %   Neutro Abs 61.4 (H) 1.4 - 6.5 K/uL   Lymphs Abs 2.3 1.0 - 3.6 K/uL   Monocytes Absolute 13.0 (H) 0.2 - 1.0 K/uL   Eosinophils Absolute 0.0 0 - 0.7 K/uL   Basophils Absolute 0.0 0 - 0.1 K/uL   Smear Review MORPHOLOGY UNREMARKABLE     Comment: Performed at [facility], [address] [place], [place] [address]  Urinalysis, Routine w reflex microscopic     Status: Abnormal   Collection Time: 03/05/18  7:44 AM  Result Value Ref Range   Color, Urine YELLOW (A) YELLOW   APPearance HAZY (A) CLEAR   Specific Gravity, Urine 1.012 1.005 - 1.030   pH 5.0 5.0 - 8.0   Glucose, UA NEGATIVE NEGATIVE mg/dL   Hgb urine dipstick NEGATIVE NEGATIVE   Bilirubin Urine NEGATIVE NEGATIVE   Ketones, ur NEGATIVE NEGATIVE mg/dL   Protein, ur NEGATIVE NEGATIVE mg/dL   Nitrite NEGATIVE NEGATIVE   Leukocytes, UA NEGATIVE NEGATIVE    Comment: Performed at [facility], [address] [place], [place] [address]  Lactic acid, plasma     Status: Abnormal   Collection Time: 03/05/18  7:44 AM  Result Value Ref Range   Lactic Acid, Venous 4.4 (HH) 0.5 - 1.9 mmol/L    Comment: CRITICAL RESULT CALLED TO, READ BACK BY AND VERIFIED WITH BRENDY DAVIS '@0824'  ON 03/05/18 BY HKP Performed at [facility], [address] [place], [place] [address]   Glucose, capillary     Status: Abnormal   Collection Time: 03/05/18 11:08 AM  Result Value Ref Range   Glucose-Capillary 124 (H) 65 - 99 mg/dL    Dg Chest 2 View  Result Date: 03/05/2018 CLINICAL DATA:  Tachypnea EXAM: CHEST - 2 VIEW COMPARISON:  12/26/2017 chest radiograph. FINDINGS: Stable cardiomediastinal silhouette with normal heart size. No pneumothorax. No pleural effusion. Emphysema. Hyperinflated lungs. No pulmonary edema. Patchy  reticular and consolidative opacities at both lung bases, which appear chronic and slightly worsened at the right lung base. IMPRESSION: 1. Patchy reticular and consolidative opacities at both lung bases, chronic, slightly worsened at the right lung base, cannot exclude acute pneumonia or aspiration superimposed on chronic interstitial lung disease. 2. Hyperinflated lungs and emphysema, suggesting COPD. Electronically Signed   By: Janina Mayo.D.  On: 03/05/2018 08:35    ROS Blood pressure (!) 89/50, pulse 78, temperature 97.9 F (36.6 C), temperature source Oral, resp. rate (!) 22, height '5\' 7"'  (1.702 m), weight 130 lb 15.3 oz (59.4 kg), SpO2 94 %. Physical Exam Elderly-appearing male, hard of hearing, awake alert and communicating in no acute distress Vital signs: Please see the above listed vital signs HEENT: Trachea is midline, no oral lesions noted, no accessory muscle utilization noted Cardiovascular: Irregularly irregular rhythm with controlled ventricular response, atrial fibrillation noted on monitor Pulmonary: Scattered crackles appreciated Abdominal: Positive bowel sounds, soft exam Extremities: No clubbing cyanosis or edema noted Neurologic: Patient moves all extremities  Assessment/Plan:  Sepsis. Most likely source is lungs with multilobar infiltrates noted. Leukocytosis noted along with elevated lactic acid.Patient also had nausea vomiting and diarrhea as additional etiology. Marginal blood pressure, prerenal indices most likely reflects dehydration. Patient empirically started on cefepime. We'll also send for C. Difficile if diarrhea persists  Significant elevation in white count. Unclear if he has had chronic issues with this. His last white count 5 days ago was only marginally elevated. While this may represent a reactive leukocytosis/leukemoid reaction from infection, may need follow-up with hematology to evaluate for myeloproliferative process  Prerenal indices. Fluid  resuscitation  Anemia. No evidence of active bleeding  Hypokalemia we'll replace    Thomas Townsend 03/05/2018, 11:18 AM

## 2018-03-05 NOTE — ED Notes (Signed)
Report attempted to be called at this time. RN will monitor.

## 2018-03-05 NOTE — ED Notes (Signed)
Pt daughter is at bedside. Pt daughter Jannifer Rodney is pt POA.  She states he has hx of CHF Afib and leukemia.

## 2018-03-05 NOTE — Clinical Social Work Note (Signed)
CSW received consult for possible SNF placement. PT evaluation is pending. CSW will follow pending PT evaluation.  Santiago Bumpers, MSW, Latanya Presser [phone]

## 2018-03-05 NOTE — ED Provider Notes (Signed)
[facility] Emergency Department Provider Note   ____________________________________________   I have reviewed the triage vital signs and the nursing notes.   HISTORY  Chief Complaint Vomiting,diarrhea  History limited by: Not Limited   HPI Thomas Townsend is a 84 y.o. male who presents to the emergency department today via EMS because of concern for nausea, vomiting and diarrhea. Symptoms started yesterday. Multiple episodes of each. No blood in either. Patient denies any associated abdominal pain. Denies any chest pain or cough or shortness of breath. No change in urination. Denies any known sick contacts.   Per medical record review patient has a history of admission a couple of months ago secondary to sepsis and pneumonia.   Past Medical History:  Diagnosis Date  . Arthritis   . Atrial fibrillation ([place])   . CHF (congestive heart failure) ([place])   . COPD (chronic obstructive pulmonary disease) ([place])   . Dysrhythmia   . GERD (gastroesophageal reflux disease)   . Hyperlipemia   . Hypertension   . Leukocytosis 01/14/2016  . Prostate cancer ([place])   . Stroke [facility])     Patient Active Problem List   Diagnosis Date Noted  . Long term (current) use of anticoagulants 01/11/2018  . Hypertension 07/14/2017  . Altered mental status   . Palliative care encounter   . CLL (chronic lymphocytic leukemia) ([place]) 04/12/2017  . Acute metabolic encephalopathy 60/73/7106  . Aspiration pneumonia ([place]) 03/22/2017  . Right upper lobe pneumonia ([place]) 10/05/2016  . Chronic neutrophilia 08/26/2016  . Iron deficiency anemia due to chronic blood loss 08/26/2016  . Myeloproliferative neoplasm ([place]) 06/25/2016  . Anemia due to other cause 06/25/2016  . Diarrhea 02/26/2016  . Leukocytosis 01/14/2016  . Acidosis, lactic 08/11/2015  . Acute respiratory failure with hypoxia ([place]) 08/11/2015  . Pulmonary hypertension ([place]) 08/11/2015  . Mild aortic stenosis 08/11/2015  .  Generalized weakness 08/11/2015  . Anemia of chronic disease 08/11/2015  . Sepsis ([place]) 08/06/2015  . Atrial fibrillation with rapid ventricular response ([place]) 08/06/2015  . CAP (community acquired pneumonia) 08/06/2015  . Rheumatoid arthritis involving multiple joints ([place]) 01/28/2015  . CAFL (chronic airflow limitation) ([place]) 03/04/2014  . Acid reflux 03/04/2014  . Chronic diastolic CHF (congestive heart failure) ([place]) 03/04/2014  . Atrial fibrillation ([place]) 03/04/2014  . COPD (chronic obstructive pulmonary disease) ([place]) 03/04/2014    Past Surgical History:  Procedure Laterality Date  . CHOLECYSTECTOMY    . COLONOSCOPY WITH PROPOFOL N/A 04/04/2015   Procedure: COLONOSCOPY WITH PROPOFOL;  Surgeon: Manya Silvas, MD;  Location[facility] ENDOSCOPY;  Service: Endoscopy;  Laterality: N/A;  . ESOPHAGOGASTRODUODENOSCOPY N/A 04/04/2015   Procedure: ESOPHAGOGASTRODUODENOSCOPY (EGD);  Surgeon: Manya Silvas, MD;  Location: [facility] ENDOSCOPY;  Service: Endoscopy;  Laterality: N/A;  . EYE SURGERY      Prior to Admission medications   Medication Sig Start Date End Date Taking? Authorizing Provider  acetaminophen (TYLENOL) 325 MG tablet Take 2 tablets (650 mg total) by mouth every 6 (six) hours as needed for mild pain (or Fever >/= 101). Patient not taking: Reported on 01/18/2018 07/03/17   [name], MD  albuterol (PROVENTIL HFA;VENTOLIN HFA) 108 (90 Base) MCG/ACT inhaler Inhale 2 puffs into the lungs every 6 (six) hours as needed for wheezing or shortness of breath. Patient not taking: Reported on 01/18/2018 10/07/16   Hillary Bow, MD  Besifloxacin HCl (BESIVANCE) 0.6 % SUSP Apply to eye.    [provider]  BREO ELLIPTA 100-25 MCG/INH AEPB Inhale 1  puff into the lungs every morning. 10/03/17   Wilhelmina Mcardle, MD  citalopram (CELEXA) 20 MG tablet Take 20 mg by mouth daily.    [provider]  digoxin (LANOXIN) 0.125 MG tablet Take 1 tablet (0.125 mg total) every morning by  mouth. 08/15/17   Elby Beck, FNP  ferrous sulfate 325 (65 FE) MG tablet Take 325 mg by mouth every morning. Reported on 02/25/2016    [provider]  folic acid (FOLVITE) 1 MG tablet Take 1 mg by mouth daily.    [provider]  furosemide (LASIX) 20 MG tablet Take 1 tablet (20 mg total) every other day by mouth. In the morning 08/15/17   Elby Beck, FNP  methotrexate (RHEUMATREX) 2.5 MG tablet TAKE 6 TABLETS (15 MG TOTAL) BY MOUTH EVERY 7 (SEVEN) DAYS. 12/24/15   [provider]  montelukast (SINGULAIR) 10 MG tablet Take 10 mg by mouth daily.     [provider]  Multiple Vitamins-Minerals (PRESERVISION AREDS PO) Take 1 tablet by mouth 2 (two) times daily.     [provider]  nystatin (MYCOSTATIN) 100000 UNIT/ML suspension Swish one teaspoon around mouth four times a day for 7-14 days. 01/06/18   Elby Beck, FNP  warfarin (COUMADIN) 4 MG tablet Take 4 mg by mouth one time only at 6 PM.     [provider]    Allergies Patient has no known allergies.  Family History  Problem Relation Age of Onset  . Hypertension Other     Social History Social History   Tobacco Use  . Smoking status: Former Smoker    Years: 20.00    Types: Cigarettes  . Smokeless tobacco: Never Used  Substance Use Topics  . Alcohol use: Yes    Alcohol/week: 0.6 oz    Types: 1 Cans of beer per week    Comment: Wine every once in a while  . Drug use: No    Review of Systems Constitutional: No fever/chills Eyes: No visual changes. ENT: No sore throat. Cardiovascular: Denies chest pain. Respiratory: Denies shortness of breath. Gastrointestinal: No abdominal pain.  Positive for nausea, vomiting, diarrhea.  Genitourinary: Negative for dysuria. Musculoskeletal: Negative for back pain. Skin: Negative for rash. Neurological: Negative for headaches, focal weakness or numbness.  ____________________________________________   PHYSICAL  EXAM:  VITAL SIGNS: ED Triage Vitals  Enc Vitals Group     BP 95/50     Pulse 96     Resp      Temp 100.3     Temp src      SpO2 91   Constitutional: Alert and oriented.  Eyes: Conjunctivae are normal.  ENT      Head: Normocephalic and atraumatic.      Nose: No congestion/rhinnorhea.      Mouth/Throat: Mucous membranes are moist.      Neck: No stridor. Hematological/Lymphatic/Immunilogical: No cervical lymphadenopathy. Cardiovascular: Normal rate, regular rhythm.  No murmurs, rubs, or gallops.  Respiratory: Tachypnea, some wheezing heard in lower left lung.  Gastrointestinal: Soft and non tender. No rebound. No guarding.  Genitourinary: Deferred Musculoskeletal: Normal range of motion in all extremities. No lower extremity edema. Neurologic:  Normal speech and language. No gross focal neurologic deficits are appreciated.  Skin:  Skin is warm, dry and intact. No rash noted. Psychiatric: Mood and affect are normal. Speech and behavior are normal. Patient exhibits appropriate insight and judgment.  ____________________________________________    LABS (pertinent positives/negatives)  Lactic 4.4 UA negative  leukocytes, nitrite CMP na 136, k 3.2, glu 107, cr 1.17 CBC wbc 76.7, hgb 9.7, plt 163 ____________________________________________   EKG  None  ____________________________________________    RADIOLOGY  CXR Concern for acute pneumonia in right lung base  I, Shyam Dawson, personally viewed and evaluated these images (plain radiographs) as part of my medical decision making. ____________________________________________   PROCEDURES  Procedures  CRITICAL CARE Performed by: Nance Pear   Total critical care time: 35 minutes  Critical care time was exclusive of separately billable procedures and treating other patients.  Critical care was necessary to treat or prevent imminent or life-threatening deterioration.  Critical care was time spent  personally by me on the following activities: development of treatment plan with patient and/or surrogate as well as nursing, discussions with consultants, evaluation of patient's response to treatment, examination of patient, obtaining history from patient or surrogate, ordering and performing treatments and interventions, ordering and review of laboratory studies, ordering and review of radiographic studies, pulse oximetry and re-evaluation of patient's condition.  ____________________________________________   INITIAL IMPRESSION / ASSESSMENT AND PLAN / ED COURSE  Pertinent labs & imaging results that were available during my care of the patient were reviewed by me and considered in my medical decision making (see chart for details).   Patient presented to the emergency department today because of concerns for nausea vomiting and diarrhea.  Patient was found to be febrile.  Patient was called a code sepsis.  According to the daughter patient has had aspiration events in the past and x-ray is concerning for right-sided pneumonia.  He was given broad-spectrum antibiotics.  Was given fluid boluses.  Patient will be admitted to the hospital service.  ____________________________________________   FINAL CLINICAL IMPRESSION(S) / ED DIAGNOSES  Final diagnoses:  Nausea vomiting and diarrhea  Sepsis, due to unspecified organism ([place])  Pneumonia due to infectious organism, unspecified laterality, unspecified part of lung  Leukocytosis, unspecified type  Elevated lactic acid level     Note: This dictation was prepared with Dragon dictation. Any transcriptional errors that result from this process are unintentional     Nance Pear, MD 03/05/18 [phone]

## 2018-03-05 NOTE — ED Triage Notes (Signed)
Pt presents to the ED via [facility] pt is from home with son. Pt is here for N/V/D. EMS states son wants help with placement. Pt presents 88% on RA 2L [place] is administered at this time. EDP at bedside.

## 2018-03-05 NOTE — Progress Notes (Signed)
2350 Patria Mane, NP at bedside.  Stated to increase MIV fluids to 145ml/hr and start 540ml NS bolus.  Tasks completed.

## 2018-03-05 NOTE — Plan of Care (Signed)
Patient and family oriented to ICU and POC

## 2018-03-05 NOTE — H&P (Addendum)
[place] [facility] [place] NAME: Thomas Townsend    MR#:  [id number]  DATE OF BIRTH:  07/14/1931  DATE OF ADMISSION:  03/05/2018  PRIMARY CARE PHYSICIAN: Elby Beck, FNP   REQUESTING/REFERRING PHYSICIAN:  Dr Archie Balboa  CHIEF COMPLAINT:   Nausea, vomiting and diarrhea HISTORY OF PRESENT ILLNESS:  Thomas Townsend  is a 84 y.o. male with a known history of chronic atrial fibrillation on anticoagulation and COPD not on oxygen who presents to the emergency room via EMS due to nausea, vomiting diarrhea. Daughter is at bedside and reports that yesterday patient was in his usual state of health.  This morning when patient's son went to check on him he was covered in feces.  He was brought to the ER for further evaluation.  In the emergency room chest x-ray was concerning for pneumonia.  He was also found to be hypoxic.  He was started on broad-spectrum antibiotics including cefepime and vancomycin.  He was last hospitalized in April. Patient is an active person.  He is a Landscape architect.  PAST MEDICAL HISTORY:   Past Medical History:  Diagnosis Date  . Arthritis   . Atrial fibrillation ([place])   . CHF (congestive heart failure) ([place])   . COPD (chronic obstructive pulmonary disease) ([place])   . Dysrhythmia   . GERD (gastroesophageal reflux disease)   . Hyperlipemia   . Hypertension   . Leukocytosis 01/14/2016  . Prostate cancer ([place])   . Stroke [facility])     PAST SURGICAL HISTORY:   Past Surgical History:  Procedure Laterality Date  . CHOLECYSTECTOMY    . COLONOSCOPY WITH PROPOFOL N/A 04/04/2015   Procedure: COLONOSCOPY WITH PROPOFOL;  Surgeon: Manya Silvas, MD;  Location: [facility] ENDOSCOPY;  Service: Endoscopy;  Laterality: N/A;  . ESOPHAGOGASTRODUODENOSCOPY N/A 04/04/2015   Procedure: ESOPHAGOGASTRODUODENOSCOPY (EGD);  Surgeon: Manya Silvas, MD;  Location: [facility] ENDOSCOPY;  Service: Endoscopy;  Laterality: N/A;  . EYE SURGERY      SOCIAL HISTORY:    Social History   Tobacco Use  . Smoking status: Former Smoker    Years: 20.00    Types: Cigarettes  . Smokeless tobacco: Never Used  Substance Use Topics  . Alcohol use: Yes    Alcohol/week: 0.6 oz    Types: 1 Cans of beer per week    Comment: Wine every once in a while    FAMILY HISTORY:   Family History  Problem Relation Age of Onset  . Hypertension Other     DRUG ALLERGIES:  No Known Allergies  REVIEW OF SYSTEMS:   Review of Systems  Constitutional: Positive for malaise/fatigue. Negative for chills and fever.       Hard of hearing  HENT: Negative.  Negative for ear discharge, ear pain, hearing loss, nosebleeds and sore throat.        Macular degeneration  Eyes: Negative.  Negative for blurred vision and pain.  Respiratory: Positive for shortness of breath. Negative for cough, hemoptysis and wheezing.   Cardiovascular: Negative.  Negative for chest pain, palpitations and leg swelling.  Gastrointestinal: Negative.  Negative for abdominal pain, blood in stool, diarrhea, nausea and vomiting.  Genitourinary: Negative.  Negative for dysuria.  Musculoskeletal: Negative.  Negative for back pain.  Skin: Negative.   Neurological: Negative for dizziness, tremors, speech change, focal weakness, seizures and headaches.  Endo/Heme/Allergies: Negative.  Does not bruise/bleed easily.  Psychiatric/Behavioral: Negative.  Negative for depression, hallucinations and suicidal ideas.    MEDICATIONS  AT HOME:   Prior to Admission medications   Medication Sig Start Date End Date Taking? Authorizing Provider  BREO ELLIPTA 100-25 MCG/INH AEPB Inhale 1 puff into the lungs every morning. 10/03/17  Yes Wilhelmina Mcardle, MD  citalopram (CELEXA) 20 MG tablet Take 20 mg by mouth daily.   Yes [provider]  digoxin (LANOXIN) 0.125 MG tablet Take 1 tablet (0.125 mg total) every morning by mouth. 08/15/17  Yes Elby Beck, FNP  ferrous sulfate 325 (65 FE) MG tablet Take 325 mg by  mouth every morning. Reported on 02/25/2016   Yes [provider]  folic acid (FOLVITE) 1 MG tablet Take 1 mg by mouth daily.   Yes [provider]  furosemide (LASIX) 20 MG tablet Take 1 tablet (20 mg total) every other day by mouth. In the morning Patient taking differently: Take 20 mg by mouth every other day. PRN FOR FLUID RETENTION 08/15/17  Yes Elby Beck, FNP  methotrexate (RHEUMATREX) 2.5 MG tablet TAKE 6 TABLETS (15 MG TOTAL) BY MOUTH EVERY 7 (SEVEN) DAYS. 12/24/15  Yes [provider]  montelukast (SINGULAIR) 10 MG tablet Take 10 mg by mouth daily.    Yes [provider]  Multiple Vitamins-Minerals (PRESERVISION AREDS PO) Take 1 tablet by mouth 2 (two) times daily.    Yes [provider]  nystatin (MYCOSTATIN) 100000 UNIT/ML suspension Swish one teaspoon around mouth four times a day for 7-14 days. 01/06/18  Yes Elby Beck, FNP  warfarin (COUMADIN) 4 MG tablet Take 2-4 mg by mouth one time only at 6 PM. 2MG -MONDAY,WEDNESDAY,FRIDAY 4MG -Sunday,TUESDAY,THURSDAY,SATURDAY   Yes [provider]  acetaminophen (TYLENOL) 325 MG tablet Take 2 tablets (650 mg total) by mouth every 6 (six) hours as needed for mild pain (or Fever >/= 101). Patient not taking: Reported on 01/18/2018 07/03/17   [name], MD  albuterol (PROVENTIL HFA;VENTOLIN HFA) 108 (90 Base) MCG/ACT inhaler Inhale 2 puffs into the lungs every 6 (six) hours as needed for wheezing or shortness of breath. Patient not taking: Reported on 01/18/2018 10/07/16   Hillary Bow, MD      VITAL SIGNS:  Blood pressure (!) 95/50, pulse 99, temperature 100.3 F (37.9 C), temperature source Oral, resp. rate (!) 23, height 5' (1.524 m), weight 56.7 kg (124 lb 14.4 oz), SpO2 93 %.  PHYSICAL EXAMINATION:   Physical Exam  Constitutional: He is oriented to person, place, and time. No distress.  Thin frail  HENT:  Head: Normocephalic.  Eyes: Conjunctivae are normal. No scleral  icterus.  Neck: Normal range of motion. Neck supple. No JVD present. No tracheal deviation present.  Cardiovascular: Normal rate and normal heart sounds. Exam reveals no gallop and no friction rub.  No murmur heard. Irr, irr  Pulmonary/Chest: Effort normal. No respiratory distress. He has no wheezes. He has rales. He exhibits no tenderness.  Abdominal: Soft. Bowel sounds are normal. He exhibits no distension and no mass. There is no tenderness. There is no rebound and no guarding.  Musculoskeletal: Normal range of motion. He exhibits no edema.  Neurological: He is alert and oriented to person, place, and time.  Skin: Skin is warm. No rash noted. No erythema.  Psychiatric: He has a normal mood and affect. Judgment normal.      LABORATORY PANEL:   CBC Recent Labs  Lab 03/05/18 0744  WBC 76.7*  HGB 9.7*  HCT 30.5*  PLT 163   ------------------------------------------------------------------------------------------------------------------  Chemistries  Recent Labs  Lab 03/05/18 0744  NA 136  K 3.2*  CL 105  CO2 20*  GLUCOSE 107*  BUN 23*  CREATININE 1.17  CALCIUM 7.8*  AST 37  ALT 15*  ALKPHOS 78  BILITOT 1.2   ------------------------------------------------------------------------------------------------------------------  Cardiac Enzymes No results for input(s): TROPONINI in the last 168 hours. ------------------------------------------------------------------------------------------------------------------  RADIOLOGY:  Dg Chest 2 View  Result Date: 03/05/2018 CLINICAL DATA:  Tachypnea EXAM: CHEST - 2 VIEW COMPARISON:  12/26/2017 chest radiograph. FINDINGS: Stable cardiomediastinal silhouette with normal heart size. No pneumothorax. No pleural effusion. Emphysema. Hyperinflated lungs. No pulmonary edema. Patchy reticular and consolidative opacities at both lung bases, which appear chronic and slightly worsened at the right lung base. IMPRESSION: 1. Patchy reticular  and consolidative opacities at both lung bases, chronic, slightly worsened at the right lung base, cannot exclude acute pneumonia or aspiration superimposed on chronic interstitial lung disease. 2. Hyperinflated lungs and emphysema, suggesting COPD. Electronically Signed   By: Ilona Sorrel M.D.   On: 03/05/2018 08:35    EKG:   none IMPRESSION AND PLAN:   84 year old male with history of COPD, CLL not on chemo and chronic atrial fibrillation on anticoagulation who presents with nausea, vomiting and diarrhea and found to have pneumonia.  1.  Acute hypoxic respiratory failure in the setting of pneumonia Wean oxygen as tolerated  2.  HCAP: Continue cefepime MRSA PCR ordered Speech consultation to evaluate for underlying aspiration  3.  Chronic atrial fibrillation: Pharmacy consultation requested for Coumadin therapy Continue digoxin 4.  Hypotension: This is due to sepsis from pneumonia Patient to be admitted to stepdown Case discussed with intensivist  5.  Sepsis: Patient presents with fever, tachypnea, leukocytosis and hypotension Sepsis due to pneumonia   6.  CLL: Obtain consultation with oncology.  7.  Chronic anemia: Continue ferrous sulfate and folic acid  8.  Depression: Continue Celexa  All the records are reviewed and case discussed with ED provider. Management plans discussed with the patient and daughter and they are in agreement  CODE STATUS: DNR  TOTAL TIME TAKING CARE OF THIS PATIENT: 40 minutes.    Thomas Townsend M.D on 03/05/2018 at 9:36 AM  Between 7am to 6pm - Pager - [phone]  After 6pm go to [email or web address] - password [email or web address] [facility]  Office  [phone]  CC: Primary care physician; Elby Beck, FNP

## 2018-03-05 NOTE — Progress Notes (Signed)
Pharmacy Antibiotic Note  Thomas Townsend is a 84 y.o. male admitted on 03/05/2018 with pneumonia.  Pharmacy has been consulted for cefepime dosing.  Plan: Cefepime 2g IV q24h (CrCl 33.2 ml/min)   Height: 5' (152.4 cm) Weight: 124 lb 14.4 oz (56.7 kg) IBW/kg (Calculated) : 50  Temp (24hrs), Avg:100.3 F (37.9 C), Min:100.3 F (37.9 C), Max:100.3 F (37.9 C)  Recent Labs  Lab 02/28/18 1011 03/05/18 0744  WBC 14.9* 76.7*  CREATININE  --  1.17  LATICACIDVEN  --  4.4*    Estimated Creatinine Clearance: 33.2 mL/min (by C-G formula based on SCr of 1.17 mg/dL).    No Known Allergies  Antimicrobials this admission: 6/9 Vanco >> once 6/9 Cefepime >>   Dose adjustments this admission:   Microbiology results: 6/9 BCx: Sent  6/9 MRSA PCR: sent 6/9 Procalcitonin: add on lab   Thank you for allowing pharmacy to be a part of this patient's care.  Candelaria Stagers, PharmD Pharmacy Resident  03/05/2018 9:40 AM

## 2018-03-05 NOTE — Consult Note (Signed)
Hematology/Oncology Consult note [facility] Telephone:([phone] [phone]  Patient Care Team: Elby Beck, FNP as PCP - General (Nurse Practitioner) Allyne Gee, MD (Internal Medicine)   Name of the patient: Thomas Townsend  [id number]  05-20-1931   Date of visit: 03/05/18 REASON FOR COSULTATION:   History of presenting illness-  84 y.o. male with history of chronic leukocytosis with previous extensive work-up without definitive diagnosis currently admitting ICU due to sepsis.  Oncology was consulted due to extremely elevated white count. Patient presented with nausea vomiting or diarrhea.  In the emergency room x-ray showed diffuse multilobar infiltrates, concerning patient follows up with pneumonia.  He was also found to be hypoxic started on broad-spectrum IV antibiotics with cephapirin and vancomycin.  Hypotensive admitted to ICU not currently on vasopressor.  Her white count being noted as 76.7 thousand, platelet counts 1 63,000, lactic acid 4.4. Dr. Rogue Bussing at cancer center for chronically elevated leukocytosis.  In the past he has had extensive work-up.  Per note, Dr.Pandit as ordered BMBx 2013-No definitive features of a Myeloproliferative neoplasia including CMML, normal cytogenetics (66 XY. Normocellular to focally mildly hypocellular marrow for age (30-50%) with trilineage hematopoiesis, no overt monocytosis ; Flow study unremarkable Patient was last seen by Dr. Lynett Fish in April 2019 and at that time his white count noted to be mildly elevated, baseline 20-30,000.  Today patient was seen and examined at the bedside.  He was initially sleeping and woke up.  He reports feeling hot and request her continue to turn down little bit.  Otherwise n not able to verbalize well due to weakness...  Daughter is at bedside.  Review of Systems  Unable to perform ROS: Critical illness  Denies pain, feels hot.  Denies nausea vomiting.  No Known  Allergies  Patient Active Problem List   Diagnosis Date Noted  . Long term (current) use of anticoagulants 01/11/2018  . Hypertension 07/14/2017  . Altered mental status   . Palliative care encounter   . CLL (chronic lymphocytic leukemia) ([place]) 04/12/2017  . Acute metabolic encephalopathy 62/13/0865  . Aspiration pneumonia ([place]) 03/22/2017  . Right upper lobe pneumonia ([place]) 10/05/2016  . Chronic neutrophilia 08/26/2016  . Iron deficiency anemia due to chronic blood loss 08/26/2016  . Myeloproliferative neoplasm ([place]) 06/25/2016  . Anemia due to other cause 06/25/2016  . Diarrhea 02/26/2016  . Leukocytosis 01/14/2016  . Acidosis, lactic 08/11/2015  . Acute respiratory failure with hypoxia ([place]) 08/11/2015  . Pulmonary hypertension ([place]) 08/11/2015  . Mild aortic stenosis 08/11/2015  . Generalized weakness 08/11/2015  . Anemia of chronic disease 08/11/2015  . Sepsis ([place]) 08/06/2015  . Atrial fibrillation with rapid ventricular response ([place]) 08/06/2015  . CAP (community acquired pneumonia) 08/06/2015  . Rheumatoid arthritis involving multiple joints ([place]) 01/28/2015  . CAFL (chronic airflow limitation) ([place]) 03/04/2014  . Acid reflux 03/04/2014  . Chronic diastolic CHF (congestive heart failure) ([place]) 03/04/2014  . Atrial fibrillation ([place]) 03/04/2014  . COPD (chronic obstructive pulmonary disease) ([place]) 03/04/2014     Past Medical History:  Diagnosis Date  . Arthritis   . Atrial fibrillation ([place])   . CHF (congestive heart failure) ([place])   . COPD (chronic obstructive pulmonary disease) ([place])   . Dysrhythmia   . GERD (gastroesophageal reflux disease)   . Hyperlipemia   . Hypertension   . Leukocytosis 01/14/2016  . Prostate cancer ([place])   . Stroke [facility])      Past Surgical History:  Procedure Laterality Date  .  CHOLECYSTECTOMY    . COLONOSCOPY WITH PROPOFOL N/A 04/04/2015   Procedure: COLONOSCOPY WITH PROPOFOL;  Surgeon: Manya Silvas, MD;  Location: [facility]  ENDOSCOPY;  Service: Endoscopy;  Laterality: N/A;  . ESOPHAGOGASTRODUODENOSCOPY N/A 04/04/2015   Procedure: ESOPHAGOGASTRODUODENOSCOPY (EGD);  Surgeon: Manya Silvas, MD;  Location: [facility] ENDOSCOPY;  Service: Endoscopy;  Laterality: N/A;  . EYE SURGERY      Social History   Socioeconomic History  . Marital status: Widowed    Spouse name: Not on file  . Number of children: Not on file  . Years of education: Not on file  . Highest education level: Not on file  Occupational History  . Not on file  Social Needs  . Financial resource strain: Not on file  . Food insecurity:    Worry: Not on file    Inability: Not on file  . Transportation needs:    Medical: Not on file    Non-medical: Not on file  Tobacco Use  . Smoking status: Former Smoker    Years: 20.00    Types: Cigarettes  . Smokeless tobacco: Never Used  Substance and Sexual Activity  . Alcohol use: Yes    Alcohol/week: 0.6 oz    Types: 1 Cans of beer per week    Comment: Wine every once in a while  . Drug use: No  . Sexual activity: Never  Lifestyle  . Physical activity:    Days per week: Not on file    Minutes per session: Not on file  . Stress: Not on file  Relationships  . Social connections:    Talks on phone: Not on file    Gets together: Not on file    Attends religious service: Not on file    Active member of club or organization: Not on file    Attends meetings of clubs or organizations: Not on file    Relationship status: Not on file  . Intimate partner violence:    Fear of current or ex partner: Not on file    Emotionally abused: Not on file    Physically abused: Not on file    Forced sexual activity: Not on file  Other Topics Concern  . Not on file  Social History Narrative   ** Merged History Encounter **         Family History  Problem Relation Age of Onset  . Hypertension Other      Current Facility-Administered Medications:  .  0.9 %  sodium chloride infusion, , Intravenous,  Continuous, Mody, Sital, MD, Last Rate: 50 mL/hr at 03/05/18 1213 .  acetaminophen (TYLENOL) tablet 650 mg, 650 mg, Oral, Q6H PRN **OR** acetaminophen (TYLENOL) suppository 650 mg, 650 mg, Rectal, Q6H PRN, Benjie Karvonen, Sital, MD .  Derrill Memo ON 03/06/2018] ceFEPIme (MAXIPIME) 2 g in sodium chloride 0.9 % 100 mL IVPB, 2 g, Intravenous, Q24H, Shuder, [place], RPH .  citalopram (CELEXA) tablet 20 mg, 20 mg, Oral, Daily, Mody, Sital, MD, 20 mg at 03/05/18 1217 .  digoxin (LANOXIN) tablet 0.125 mg, 0.125 mg, Oral, BH-q7a, Mody, Sital, MD, 0.125 mg at 03/05/18 1218 .  ferrous sulfate tablet 325 mg, 325 mg, Oral, q morning - 10a, Mody, Sital, MD, 325 mg at 03/05/18 1216 .  fluticasone furoate-vilanterol (BREO ELLIPTA) 100-25 MCG/INH 1 puff, 1 puff, Inhalation, BH-q7a, Mody, Sital, MD .  folic acid (FOLVITE) tablet 1 mg, 1 mg, Oral, Daily, Mody, Sital, MD, 1 mg at 03/05/18 1213 .  HYDROcodone-acetaminophen (NORCO/VICODIN) 5-325 MG per tablet 1-2  tablet, 1-2 tablet, Oral, Q4H PRN, Mody, Sital, MD .  methotrexate (RHEUMATREX) tablet 2.5 mg, 2.5 mg, Oral, Weekly, Mody, Sital, MD, 2.5 mg at 03/05/18 1350 .  montelukast (SINGULAIR) tablet 10 mg, 10 mg, Oral, Daily, Mody, Sital, MD, 10 mg at 03/05/18 1219 .  ondansetron (ZOFRAN) tablet 4 mg, 4 mg, Oral, Q6H PRN **OR** ondansetron (ZOFRAN) injection 4 mg, 4 mg, Intravenous, Q6H PRN, Benjie Karvonen, Sital, MD, 4 mg at 03/05/18 1249 .  senna-docusate (Senokot-S) tablet 1 tablet, 1 tablet, Oral, QHS PRN, Mody, Sital, MD .  warfarin (COUMADIN) tablet 4 mg, 4 mg, Oral, ONCE-1800, Shuder, [place], RPH .  Warfarin - Pharmacist Dosing Inpatient, , Does not apply, q1800, Candelaria Stagers, [facility]   Physical exam: ECOG  Vitals:   03/05/18 1200 03/05/18 1300 03/05/18 1400 03/05/18 1500  BP: (!) 74/33 (!) 109/58 (!) 103/57 (!) 89/65  Pulse: 80 92 93 83  Resp: 16 (!) 23 (!) 31 (!) 25  Temp:      TempSrc:      SpO2: 93% (!) 74% 91% 96%  Weight:      Height:       Physical Exam    Constitutional: No distress.  HENT:  Head: Normocephalic and atraumatic.  Nose: Nose normal.  Eyes: Pupils are equal, round, and reactive to light. EOM are normal.  Neck: Normal range of motion. Neck supple.  Cardiovascular: Normal heart sounds.  Irregular rate and rhythm  Pulmonary/Chest: Effort normal. No respiratory distress.  Scattered crackles bilaterally.  Abdominal: Soft. He exhibits no distension and no mass.  Musculoskeletal: Normal range of motion. He exhibits no edema or tenderness.  Neurological: He is alert. No cranial nerve deficit. He exhibits normal muscle tone.  Skin: Skin is warm and dry.  Psychiatric: Mood normal.        CMP Latest Ref Rng & Units 03/05/2018  Glucose 65 - 99 mg/dL 104(H)  BUN 6 - 20 mg/dL 22(H)  Creatinine 0.61 - 1.24 mg/dL 0.96  Sodium 135 - 145 mmol/L 136  Potassium 3.5 - 5.1 mmol/L 3.7  Chloride 101 - 111 mmol/L 107  CO2 22 - 32 mmol/L 16(L)  Calcium 8.9 - 10.3 mg/dL 7.5(L)  Total Protein 6.5 - 8.1 g/dL -  Total Bilirubin 0.3 - 1.2 mg/dL -  Alkaline Phos 38 - 126 U/L -  AST 15 - 41 U/L -  ALT 17 - 63 U/L -   CBC Latest Ref Rng & Units 03/05/2018  WBC 3.8 - 10.6 K/uL 76.7(HH)  Hemoglobin 13.0 - 18.0 g/dL 9.7(L)  Hematocrit 40.0 - 52.0 % 30.5(L)  Platelets 150 - 440 K/uL 163   RADIOGRAPHIC STUDIES: I have personally reviewed the radiological images as listed and agreed with the findings in the report. Dg Chest 2 View  Result Date: 03/05/2018 CLINICAL DATA:  Tachypnea EXAM: CHEST - 2 VIEW COMPARISON:  12/26/2017 chest radiograph. FINDINGS: Stable cardiomediastinal silhouette with normal heart size. No pneumothorax. No pleural effusion. Emphysema. Hyperinflated lungs. No pulmonary edema. Patchy reticular and consolidative opacities at both lung bases, which appear chronic and slightly worsened at the right lung base. IMPRESSION: 1. Patchy reticular and consolidative opacities at both lung bases, chronic, slightly worsened at the right lung  base, cannot exclude acute pneumonia or aspiration superimposed on chronic interstitial lung disease. 2. Hyperinflated lungs and emphysema, suggesting COPD. Electronically Signed   By: Ilona Sorrel M.D.   On: 03/05/2018 08:35    Assessment and plan- Patient is a 84 y.o. male with a history  of chronic leukocytosis presented with generalized weakness, nausea and vomiting.  #Sepsis likely secondary to multilobar pneumonia, associated with elevated lactic acid. Hypotension improved after bolus fluids.  Continue IV antibiotics.  #Leukocytosis, predominantly neutrophilia and monocytosis., the level of 76.7, significantly elevated compared to his baseline which is around 20K to 30K.  He follows up with Dr. Rogue Bussing and was last seen in April.,  Per Dr. Sharmaine Base note,  he  had extensive work-up in the past in 2013 with a negative bone marrow biopsy, negative Jak 2 mutation, negative BCR ABL mutation.  Ultrasound done in 2013 showed no splenomegaly.  It is possible a myeloproliferative neoplasm, such as CMML,  however not confirmed. Prior to this admission, his white count is 12.1. Current extreme leukocytosis likely reactive to acute infection, superimposed on his chronically elevated leukocytosis. Recommend continue supportive care treat underlying infection.  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  She voices understanding and agreed with the plan.  #Macrocytic anemia: che folate and vitamin B12 level, can be also secondary to underlying bone marrow process. At baseline.     Thank you for allowing me to participate in the care of this patient.  Total face to face encounter time for this patient visit was 70 min. >50% of the time was  spent in counseling and coordination of care.    Earlie Server, MD, PhD Hematology Oncology [facility] at [facility] Pager-  [phone] 03/05/2018

## 2018-03-05 NOTE — ED Notes (Signed)
Date and time results received: 03/05/18 0826  Test: Lac Critical Value: 4.4  Name of Provider Notified: Archie Balboa

## 2018-03-05 NOTE — ED Notes (Signed)
Pt saugther states pt has refused chemo and radiation at this time, this is why pt WBC is high. Pt has hx of leukemia.

## 2018-03-05 NOTE — ED Notes (Signed)
Date and time results received: 03/05/18 0836   Test: WBC Critical Value: 76.6  Name of Provider Notified: Archie Balboa

## 2018-03-05 NOTE — Progress Notes (Signed)
Following up on an order request for prayer, Chaplain met with the patient and daughter and provided emotional support. After a brief conversation, Chaplain led the patient in prayer.

## 2018-03-05 NOTE — Progress Notes (Signed)
Family Meeting Note  Advance Directive:yes  Today a meeting took place with the Patient.and daughter The following clinical team members were present during this meeting:MD  The following were discussed:Patient's diagnosis: sepsis HCAP CLL, Patient's progosis: <6 monthsand Goals for treatment: DNR  Additional follow-up to be provided: palliative care consult  Time spent during discussion:17 minutes  [name], MD

## 2018-03-05 NOTE — Progress Notes (Signed)
CODE SEPSIS - PHARMACY COMMUNICATION  **Broad Spectrum Antibiotics should be administered within 1 hour of Sepsis diagnosis**  Time Code Sepsis Called/Page Received: 8101  Antibiotics Ordered: cefepime vanco 0830  Time of 1st antibiotic administration:  Antibiotics Given (last 72 hours)     Date/Time Action Medication Dose Rate   03/05/18 0832 New Bag/Given   ceFEPIme (MAXIPIME) 2 g in sodium chloride 0.9 % 100 mL IVPB 2 g 200 mL/hr   03/05/18 0929 New Bag/Given   vancomycin (VANCOCIN) IVPB 1000 mg/200 mL premix 1,000 mg 200 mL/hr        Additional action taken by pharmacy: None   If necessary, Name of Provider/Nurse Contacted: none   Thomasenia Sales, PharmD, MBA, BCGP Clinical Pharmacist 03/05/2018  10:36 AM

## 2018-03-05 NOTE — Progress Notes (Signed)
ANTICOAGULATION CONSULT NOTE - Initial Consult  Pharmacy Consult for warfarin Indication: atrial fibrillation  No Known Allergies  Patient Measurements: Height: 5' (152.4 cm) Weight: 124 lb 14.4 oz (56.7 kg) IBW/kg (Calculated) : 50  Vital Signs: Temp: 100.3 F (37.9 C) (06/09 0721) Temp Source: Oral (06/09 0721) BP: 95/50 (06/09 0720) Pulse Rate: 99 (06/09 0745)  Labs: Recent Labs    03/05/18 0744  HGB 9.7*  HCT 30.5*  PLT 163  CREATININE 1.17    Estimated Creatinine Clearance: 33.2 mL/min (by C-G formula based on SCr of 1.17 mg/dL).   Medical History: Past Medical History:  Diagnosis Date  . Arthritis   . Atrial fibrillation ([place])   . CHF (congestive heart failure) ([place])   . COPD (chronic obstructive pulmonary disease) ([place])   . Dysrhythmia   . GERD (gastroesophageal reflux disease)   . Hyperlipemia   . Hypertension   . Leukocytosis 01/14/2016  . Prostate cancer ([place])   . Stroke [facility]     Assessment: 84 year old male presents to ED due to vomiting and diarrhea which started yesterday. Per patient's daughter, patient has history of leukemia and has refused chemo/radiation. Patient on warfarin PTA for atrial fibrillation. Daughter states patient last took warfarin yesterday. Home warfarin regimen 2mg  PO every M, W F and 4mg  PO every Sun, Tues, Thurs, Sat.    Dosing History:  Date   INR   Dose 6/9     1.61     Goal of Therapy:  INR 2-3  Plan:  Warfarin INR subtherapeutic. Will give warfarin 4mg  today and recheck INR tomorrow.  Patient is concurrently on cefepime which can increase the anticoagulant effect of warfarin. Will monitor daily INR's while patient on antibiotics.  Continue to check CBC's at least every 3 days.   Candelaria Stagers, PharmD Pharmacy Resident  03/05/2018,9:48 AM

## 2018-03-06 ENCOUNTER — Inpatient Hospital Stay: Payer: Medicare Other

## 2018-03-06 DIAGNOSIS — J181 Lobar pneumonia, unspecified organism: Secondary | ICD-10-CM

## 2018-03-06 DIAGNOSIS — R7989 Other specified abnormal findings of blood chemistry: Secondary | ICD-10-CM

## 2018-03-06 DIAGNOSIS — J9601 Acute respiratory failure with hypoxia: Secondary | ICD-10-CM

## 2018-03-06 LAB — BASIC METABOLIC PANEL
Anion gap: 5 (ref 5–15)
BUN: 19 mg/dL (ref 6–20)
CALCIUM: 7.3 mg/dL — AB (ref 8.9–10.3)
CO2: 22 mmol/L (ref 22–32)
CREATININE: 0.89 mg/dL (ref 0.61–1.24)
Chloride: 110 mmol/L (ref 101–111)
GFR calc non Af Amer: >60 mL/min (ref >60)
Glucose, Bld: 102 mg/dL — ABNORMAL HIGH (ref 65–99)
Potassium: 3.7 mmol/L (ref 3.5–5.1)
Sodium: 137 mmol/L (ref 135–145)

## 2018-03-06 LAB — CBC
HCT: 24.4 % — ABNORMAL LOW (ref 40.0–52.0)
Hemoglobin: 8 g/dL — ABNORMAL LOW (ref 13.0–18.0)
MCH: 32.7 pg (ref 26.0–34.0)
MCHC: 32.8 g/dL (ref 32.0–36.0)
MCV: 99.8 fL (ref 80.0–100.0)
PLATELETS: 135 10*3/uL — AB (ref 150–440)
RBC: 2.45 MIL/uL — AB (ref 4.40–5.90)
RDW: 18.8 % — ABNORMAL HIGH (ref 11.5–14.5)
WBC: 53.2 10*3/uL — AB (ref 3.8–10.6)

## 2018-03-06 LAB — TROPONIN I
TROPONIN I: 0.03 ng/mL — AB (ref <0.03)
Troponin I: 0.03 ng/mL

## 2018-03-06 LAB — PROTIME-INR
INR: 2.06
PROTHROMBIN TIME: 23 s — AB (ref 11.4–15.2)

## 2018-03-06 MED ORDER — LACTATED RINGERS IV SOLN
INTRAVENOUS | Status: DC
  Administered 2018-03-06 – 2018-03-07 (×2): via INTRAVENOUS

## 2018-03-06 MED ORDER — IPRATROPIUM-ALBUTEROL 0.5-2.5 (3) MG/3ML IN SOLN
3.0000 mL | RESPIRATORY_TRACT | Status: DC | PRN
Start: 2018-03-06 — End: 2018-03-09
  Administered 2018-03-07: 3 mL via RESPIRATORY_TRACT
  Filled 2018-03-06: qty 3

## 2018-03-06 MED ORDER — CEFEPIME HCL 2 G IJ SOLR
2.0000 g | Freq: Two times a day (BID) | INTRAMUSCULAR | Status: DC
Start: 2018-03-06 — End: 2018-03-09
  Administered 2018-03-06 – 2018-03-09 (×6): 2 g via INTRAVENOUS
  Filled 2018-03-06 (×10): qty 2

## 2018-03-06 MED ORDER — WARFARIN SODIUM 2 MG PO TABS
2.0000 mg | ORAL_TABLET | Freq: Once | ORAL | Status: AC
Start: 2018-03-06 — End: 2018-03-06
  Administered 2018-03-06: 2 mg via ORAL
  Filled 2018-03-06: qty 1

## 2018-03-06 MED ORDER — AZITHROMYCIN 500 MG IV SOLR
500.0000 mg | INTRAVENOUS | Status: DC
  Administered 2018-03-06 – 2018-03-09 (×4): 500 mg via INTRAVENOUS
  Filled 2018-03-06 (×6): qty 500

## 2018-03-06 MED ORDER — SODIUM CHLORIDE 0.9 % IV BOLUS
500.0000 mL | Freq: Once | INTRAVENOUS | Status: AC
  Administered 2018-03-05: 500 mL via INTRAVENOUS

## 2018-03-06 NOTE — Progress Notes (Addendum)
Hematology/Oncology Progress Note [facility] [phone] [phone]  Patient Care Team: Elby Beck, FNP as PCP - General (Nurse Practitioner) Allyne Gee, MD (Internal Medicine)   Name of the patient: Thomas Townsend  [id number]  Feb 13, 1931  Date of visit: 03/06/18   INTERVAL HISTORY-  Patient was seen and examined.  Patient's son at the bedside. Patient is on high flow oxygen, appears more alert today.  Reports shortness of breath remains the same.    Review of systems- Review of Systems  Unable to perform ROS: Critical illness    No Known Allergies  Patient Active Problem List   Diagnosis Date Noted  . Pneumonia due to infectious organism   . Long term (current) use of anticoagulants 01/11/2018  . Hypertension 07/14/2017  . Altered mental status   . Palliative care encounter   . CLL (chronic lymphocytic leukemia) ([place]) 04/12/2017  . Acute metabolic encephalopathy 65/46/5035  . Aspiration pneumonia ([place]) 03/22/2017  . Right upper lobe pneumonia ([place]) 10/05/2016  . Chronic neutrophilia 08/26/2016  . Iron deficiency anemia due to chronic blood loss 08/26/2016  . Myeloproliferative neoplasm ([place]) 06/25/2016  . Anemia due to other cause 06/25/2016  . Diarrhea 02/26/2016  . Leukocytosis 01/14/2016  . Acidosis, lactic 08/11/2015  . Acute respiratory failure with hypoxia ([place]) 08/11/2015  . Pulmonary hypertension ([place]) 08/11/2015  . Mild aortic stenosis 08/11/2015  . Generalized weakness 08/11/2015  . Anemia of chronic disease 08/11/2015  . Sepsis ([place]) 08/06/2015  . Atrial fibrillation with rapid ventricular response ([place]) 08/06/2015  . CAP (community acquired pneumonia) 08/06/2015  . Rheumatoid arthritis involving multiple joints ([place]) 01/28/2015  . CAFL (chronic airflow limitation) ([place]) 03/04/2014  . Acid reflux 03/04/2014  . Chronic diastolic CHF (congestive heart failure) ([place]) 03/04/2014  . Atrial  fibrillation ([place]) 03/04/2014  . COPD (chronic obstructive pulmonary disease) ([place]) 03/04/2014     Past Medical History:  Diagnosis Date  . Arthritis   . Atrial fibrillation ([place])   . CHF (congestive heart failure) ([place])   . COPD (chronic obstructive pulmonary disease) ([place])   . Dysrhythmia   . GERD (gastroesophageal reflux disease)   . Hyperlipemia   . Hypertension   . Leukocytosis 01/14/2016  . Prostate cancer ([place])   . Stroke [facility])      Past Surgical History:  Procedure Laterality Date  . CHOLECYSTECTOMY    . COLONOSCOPY WITH PROPOFOL N/A 04/04/2015   Procedure: COLONOSCOPY WITH PROPOFOL;  Surgeon: Manya Silvas, MD;  Location: [facility] ENDOSCOPY;  Service: Endoscopy;  Laterality: N/A;  . ESOPHAGOGASTRODUODENOSCOPY N/A 04/04/2015   Procedure: ESOPHAGOGASTRODUODENOSCOPY (EGD);  Surgeon: Manya Silvas, MD;  Location: [facility] ENDOSCOPY;  Service: Endoscopy;  Laterality: N/A;  . EYE SURGERY      Social History   Socioeconomic History  . Marital status: Widowed    Spouse name: Not on file  . Number of children: Not on file  . Years of education: Not on file  . Highest education level: Not on file  Occupational History  . Not on file  Social Needs  . Financial resource strain: Not on file  . Food insecurity:    Worry: Not on file    Inability: Not on file  . Transportation needs:    Medical: Not on file    Non-medical: Not on file  Tobacco Use  . Smoking status: Former Smoker    Years: 20.00    Types: Cigarettes  . Smokeless tobacco: Never Used  Substance and Sexual  Activity  . Alcohol use: Yes    Alcohol/week: 0.6 oz    Types: 1 Cans of beer per week    Comment: Wine every once in a while  . Drug use: No  . Sexual activity: Never  Lifestyle  . Physical activity:    Days per week: Not on file    Minutes per session: Not on file  . Stress: Not on file  Relationships  . Social connections:    Talks on phone: Not on file    Gets together: Not on file     Attends religious service: Not on file    Active member of club or organization: Not on file    Attends meetings of clubs or organizations: Not on file    Relationship status: Not on file  . Intimate partner violence:    Fear of current or ex partner: Not on file    Emotionally abused: Not on file    Physically abused: Not on file    Forced sexual activity: Not on file  Other Topics Concern  . Not on file  Social History Narrative   ** Merged History Encounter **         Family History  Problem Relation Age of Onset  . Hypertension Other      Current Facility-Administered Medications:  .  0.9 %  sodium chloride infusion, , Intravenous, Continuous, Tukov-Yual, Magdalene S, NP, Last Rate: 100 mL/hr at 03/06/18 1200 .  acetaminophen (TYLENOL) tablet 650 mg, 650 mg, Oral, Q6H PRN **OR** [DISCONTINUED] acetaminophen (TYLENOL) suppository 650 mg, 650 mg, Rectal, Q6H PRN, Mody, Sital, MD .  azithromycin (ZITHROMAX) 500 mg in sodium chloride 0.9 % 250 mL IVPB, 500 mg, Intravenous, Q24H, Wilhelmina Mcardle, MD, Stopped at 03/06/18 1140 .  ceFEPIme (MAXIPIME) 2 g in sodium chloride 0.9 % 100 mL IVPB, 2 g, Intravenous, Q24H, Shuder, [place], RPH, Stopped at 03/06/18 1002 .  citalopram (CELEXA) tablet 20 mg, 20 mg, Oral, Daily, Mody, Sital, MD, 20 mg at 03/06/18 0824 .  digoxin (LANOXIN) tablet 0.125 mg, 0.125 mg, Oral, BH-q7a, Mody, Sital, MD, 0.125 mg at 03/06/18 0825 .  ferrous sulfate tablet 325 mg, 325 mg, Oral, q morning - 10a, Mody, Sital, MD, 325 mg at 03/06/18 0824 .  fluticasone furoate-vilanterol (BREO ELLIPTA) 100-25 MCG/INH 1 puff, 1 puff, Inhalation, Mechele Dawley, Sital, MD, 1 puff at 03/06/18 0824 .  folic acid (FOLVITE) tablet 1 mg, 1 mg, Oral, Daily, Mody, Sital, MD, 1 mg at 03/06/18 0824 .  HYDROcodone-acetaminophen (NORCO/VICODIN) 5-325 MG per tablet 1-2 tablet, 1-2 tablet, Oral, Q4H PRN, Mody, Sital, MD .  ipratropium-albuterol (DUONEB) 0.5-2.5 (3) MG/3ML nebulizer solution 3  mL, 3 mL, Nebulization, Q4H PRN, Wilhelmina Mcardle, MD .  methotrexate (RHEUMATREX) tablet 2.5 mg, 2.5 mg, Oral, Weekly, Mody, Sital, MD, 2.5 mg at 03/05/18 1350 .  [DISCONTINUED] ondansetron (ZOFRAN) tablet 4 mg, 4 mg, Oral, Q6H PRN **OR** ondansetron (ZOFRAN) injection 4 mg, 4 mg, Intravenous, Q6H PRN, Benjie Karvonen, Sital, MD, 4 mg at 03/06/18 0928 .  phenylephrine (NEOSYNEPHRINE) 10-0.9 MG/250ML-% infusion, 0-400 mcg/min, Intravenous, Titrated, Tukov-Yual, Magdalene S, NP, Last Rate: 6 mL/hr at 03/06/18 1200, 4 mcg/min at 03/06/18 1200 .  senna-docusate (Senokot-S) tablet 1 tablet, 1 tablet, Oral, QHS PRN, Mody, Sital, MD .  warfarin (COUMADIN) tablet 2 mg, 2 mg, Oral, ONCE-1800, Hallaji, Sheema M, RPH .  Warfarin - Pharmacist Dosing Inpatient, , Does not apply, q1800, Candelaria Stagers, [facility]   Physical exam:  Vitals:  03/06/18 0942 03/06/18 1000 03/06/18 1100 03/06/18 1200  BP:  108/69 109/67 102/67  Pulse:  (!) 109 96 (!) 103  Resp:  19 (!) 24 (!) 27  Temp:    98.1 F (36.7 C)  TempSrc:    Axillary  SpO2: 94% 98% 97% 96%  Weight:      Height:       Physical Exam  Constitutional: He is oriented to person, place, and time.  HENT:  Head: Normocephalic.  Mouth/Throat: No oropharyngeal exudate.  Eyes: Pupils are equal, round, and reactive to light. EOM are normal. No scleral icterus.  Neck: Normal range of motion. Neck supple.  Cardiovascular: Normal rate, regular rhythm and normal heart sounds.  No murmur heard. Pulmonary/Chest: Effort normal.    Diminished breath sound bilaterally.  Mild respiratory distress.  Abdominal: Soft. He exhibits no distension. There is no tenderness.  Musculoskeletal: Normal range of motion. He exhibits no edema.  Neurological: He is alert and oriented to person, place, and time. No cranial nerve deficit. He exhibits normal muscle tone.  Skin: Skin is warm and dry. No erythema.  Psychiatric: Affect normal.       CMP Latest Ref Rng & Units 03/06/2018    Glucose 65 - 99 mg/dL 102(H)  BUN 6 - 20 mg/dL 19  Creatinine 0.61 - 1.24 mg/dL 0.89  Sodium 135 - 145 mmol/L 137  Potassium 3.5 - 5.1 mmol/L 3.7  Chloride 101 - 111 mmol/L 110  CO2 22 - 32 mmol/L 22  Calcium 8.9 - 10.3 mg/dL 7.3(L)  Total Protein 6.5 - 8.1 g/dL -  Total Bilirubin 0.3 - 1.2 mg/dL -  Alkaline Phos 38 - 126 U/L -  AST 15 - 41 U/L -  ALT 17 - 63 U/L -   CBC Latest Ref Rng & Units 03/06/2018  WBC 3.8 - 10.6 K/uL 53.2(HH)  Hemoglobin 13.0 - 18.0 g/dL 8.0(L)  Hematocrit 40.0 - 52.0 % 24.4(L)  Platelets 150 - 440 K/uL 135(L)   RADIOGRAPHIC STUDIES: I have personally reviewed the radiological images as listed and agreed with the findings in the report. Dg Chest 2 View  Result Date: 03/05/2018 CLINICAL DATA:  Tachypnea EXAM: CHEST - 2 VIEW COMPARISON:  12/26/2017 chest radiograph. FINDINGS: Stable cardiomediastinal silhouette with normal heart size. No pneumothorax. No pleural effusion. Emphysema. Hyperinflated lungs. No pulmonary edema. Patchy reticular and consolidative opacities at both lung bases, which appear chronic and slightly worsened at the right lung base. IMPRESSION: 1. Patchy reticular and consolidative opacities at both lung bases, chronic, slightly worsened at the right lung base, cannot exclude acute pneumonia or aspiration superimposed on chronic interstitial lung disease. 2. Hyperinflated lungs and emphysema, suggesting COPD. Electronically Signed   By: Ilona Sorrel M.D.   On: 03/05/2018 08:35   Dg Chest Port 1 View  Result Date: 03/06/2018 CLINICAL DATA:  Increased respiratory failure, history of CHF and COPD, prostate carcinoma, former smoking history EXAM: PORTABLE CHEST 1 VIEW COMPARISON:  Chest x-ray of 03/02/2018 and 12/26/2017 FINDINGS: Although there are chronic markings at the lung bases when compared to prior images, there are more prominent interstitial markings not present diffusely. This pattern most likely represents interstitial edema superimposed  on chronic change. Small effusions may be present. Cardiomegaly is stable. IMPRESSION: 1. Increase in interstitial markings diffusely most consistent with interstitial edema. Possible small effusions. 2. Stable mild cardiomegaly and chronic interstitial change. Electronically Signed   By: Ivar Drape M.D.   On: 03/06/2018 09:28   [place] Ekg Site  Rite  Result Date: 03/05/2018 If Site Rite image not attached, placement could not be confirmed due to current cardiac rhythm.   Assessment and plan-  Patient is a 84 y.o. male With a history of chronic leukocytosis presented with generalized weakness and nausea and vomiting.  Sepsis likely secondary to multilobar pneumonia,#associated with lactic acid elevation and hypotension.  Continue IV antibiotics plus azithromycin.  Management per ICU.  #Leukocytosis with predominant neutrophilia and monocytosis.  Smear showed no abnormal morphologies. Discussed with patient's son that patient has a chronic history of leukocytosis with extensive work-up previously including negative bone marrow biopsy, negative Jak 2 mutation negative BCR ABL mutation.  Ultrasound done in 2013 also showed no splenomegaly.  He has been watched for many years with a suspicion of underlying myeloproliferative neoplasm, such as CMML.  However this is never confirmed. I think his extreme leukocytosis likely secondary to severe infection superimposed on his chronically elevated leukocytosis.  Leukocytosis has trended down.  I recommend continue supportive care to treat underlying infection.  Discussed with patient's son that a bone marrow biopsy is recommended to confirm to CMML, however even CMML diagnosis is confirmed,  there is no cure at patient's age consider his multiple comorbidities.  Son voices understanding and is not interested in proceeding with a bone marrow biopsy. Continue supportive care for now and monitor counts.   #Macrocytic anemia: Recommend check folate and B12  level.  Thank you for allowing me to participate in the care of this patient.   Earlie Server, MD, PhD Hematology Oncology [facility] at [facility] Pager- [phone] 03/06/2018

## 2018-03-06 NOTE — Care Management (Signed)
RNCM consult received and will follow. Palliative consult also pending. I understood per progression rounds that patient has CLL and does not anticipate treatment. Supplemental O2 acute. Patient currently on High flow O2 at 55%.

## 2018-03-06 NOTE — Progress Notes (Signed)
[place] at [place] NAME: Thomas Townsend    MR#:  [id number]  DATE OF BIRTH:  08/17/1931  SUBJECTIVE:   patient on bipap overnight  Son at bedside  REVIEW OF SYSTEMS:    Review of Systems  Constitutional: Negative for fever, chills weight loss + weakness  HENT: Negative for ear pain, nosebleeds, congestion, facial swelling, rhinorrhea, neck pain, neck stiffness and ear discharge.   Respiratory: ++ for cough, shortness of breath, wheezing  Cardiovascular: Negative for chest pain, palpitations and leg swelling.  Gastrointestinal: Negative for heartburn, abdominal pain, vomiting, diarrhea or consitpation Genitourinary: Negative for dysuria, urgency, frequency, hematuria Musculoskeletal: Negative for back pain or joint pain Neurological: Negative for dizziness, seizures, syncope, focal weakness,  numbness and headaches.  Hematological: Does not bruise/bleed easily.  Psychiatric/Behavioral: Negative for hallucinations, confusion, dysphoric mood    Tolerating Diet: npo      DRUG ALLERGIES:  No Known Allergies  VITALS:  Blood pressure 113/74, pulse (!) 113, temperature 98.1 F (36.7 C), temperature source Axillary, resp. rate 19, height 5\' 7"  (1.702 m), weight 59.4 kg (130 lb 15.3 oz), SpO2 94 %.  PHYSICAL EXAMINATION:  Constitutional: Appears well-developed and well-nourished. No distress. HENT: Normocephalic. . On bipap Eyes: Conjunctivae and EOM are normal. PERRLA, no scleral icterus.  Neck: Normal ROM. Neck supple. No JVD. No tracheal deviation. CVS: RRR, S1/S2 +, no murmurs, no gallops, no carotid bruit.  Pulmonary: Normal respiratory effort with bilateral crackles at bases  abdominal: Soft. BS +,  no distension, tenderness, rebound or guarding.  Musculoskeletal: Normal range of motion. No edema and no tenderness.  Neuro: Alert. . No focal deficits. Skin: Skin is warm and dry. No rash noted. Psychiatric: Normal mood and  affect.      LABORATORY PANEL:   CBC Recent Labs  Lab 03/06/18 0256  WBC 53.2*  HGB 8.0*  HCT 24.4*  PLT 135*   ------------------------------------------------------------------------------------------------------------------  Chemistries  Recent Labs  Lab 03/05/18 0744  03/05/18 2127 03/06/18 0256  NA 136   < > 136 137  K 3.2*   < > 3.6 3.7  CL 105   < > 106 110  CO2 20*   < > 23 22  GLUCOSE 107*   < > 111* 102*  BUN 23*   < > 21* 19  CREATININE 1.17   < > 0.96 0.89  CALCIUM 7.8*   < > 7.5* 7.3*  MG  --    < > 2.2  --   AST 37  --   --   --   ALT 15*  --   --   --   ALKPHOS 78  --   --   --   BILITOT 1.2  --   --   --    < > = values in this interval not displayed.   ------------------------------------------------------------------------------------------------------------------  Cardiac Enzymes Recent Labs  Lab 03/05/18 2127 03/06/18 0256  TROPONINI 0.03* 0.03*   ------------------------------------------------------------------------------------------------------------------  RADIOLOGY:  Dg Chest 2 View  Result Date: 03/05/2018 CLINICAL DATA:  Tachypnea EXAM: CHEST - 2 VIEW COMPARISON:  12/26/2017 chest radiograph. FINDINGS: Stable cardiomediastinal silhouette with normal heart size. No pneumothorax. No pleural effusion. Emphysema. Hyperinflated lungs. No pulmonary edema. Patchy reticular and consolidative opacities at both lung bases, which appear chronic and slightly worsened at the right lung base. IMPRESSION: 1. Patchy reticular and consolidative opacities at both lung bases, chronic, slightly worsened at the right lung base, cannot exclude acute pneumonia  or aspiration superimposed on chronic interstitial lung disease. 2. Hyperinflated lungs and emphysema, suggesting COPD. Electronically Signed   By: Ilona Sorrel M.D.   On: 03/05/2018 08:35   [place] Ekg Site Rite  Result Date: 03/05/2018 If Site Rite image not attached, placement could not be confirmed due  to current cardiac rhythm.    ASSESSMENT AND PLAN:    84 year old male with history of essential hypertension hyperlipidemia, PAF and CMML who presented to the emergency room due to nausea vomiting.  1.  Acute hypoxic respiratory failure in the setting of pneumonia Continue cefepime [place] BiPAP as tolerated  2.  Sepsis due to multilobar pneumonia: Continue cefepime Appreciate intensivist consult Lactic acid improving  3.  CMML with leukocytosis: Appreciate oncology evaluation.  White blood cell count has improved which suggests this is reactive due to pneumonia  4. Macrocytic Anemia: Globin remains relatively stable  5.  PAF: INR is 2.06 Continue digoxin   6.  Hypotension: Patient is asymptomatic and seems to be at baseline  7.  Depression: Continue Celexa  8.  Electrolyte abnormalities: Replete PRN  9.  Arthritis: Continue methotrexate  Management plans discussed with the patient and son and they are in agreement.  CODE STATUS: DNR  TOTAL TIME TAKING CARE OF THIS PATIENT: 30 minutes.     POSSIBLE D/C 3 days, DEPENDING ON CLINICAL CONDITION.   Icel Castles M.D on 03/06/2018 at 9:30 AM  Between 7am to 6pm - Pager - [phone] After 6pm go to [email or web address] - password [email or web address] [facility]  Office  [phone]  CC: Primary care physician; Elby Beck, FNP  Note: This dictation was prepared with Dragon dictation along with smaller phrase technology. Any transcriptional errors that result from this process are unintentional.

## 2018-03-06 NOTE — Progress Notes (Signed)
Pharmacy Antibiotic Note  Thomas Townsend is a 84 y.o. male admitted on 03/05/2018 with pneumonia.  Pharmacy has been consulted for cefepime dosing.  Plan: Cefepime 2g IV q12h   Height: 5\' 7"  (170.2 cm) Weight: 130 lb 15.3 oz (59.4 kg) IBW/kg (Calculated) : 66.1  Temp (24hrs), Avg:97.9 F (36.6 C), Min:97.4 F (36.3 C), Max:98.1 F (36.7 C)  Recent Labs  Lab 02/28/18 1011 03/05/18 0744 03/05/18 1124 03/05/18 1405 03/05/18 2127 03/06/18 0256  WBC 14.9* 76.7*  --   --  53.7* 53.2*  CREATININE  --  1.17  --  0.96 0.96 0.89  LATICACIDVEN  --  4.4* 3.1*  --  1.6  --     Estimated Creatinine Clearance: 51.9 mL/min (by C-G formula based on SCr of 0.89 mg/dL).    No Known Allergies  Antimicrobials this admission: 6/9 Vanco >> once 6/9 Cefepime >>  6/10 Azithromycin >>    Microbiology results: 6/9 BCx: pending 6/9 MRSA PCR: negative   6/9 Procalcitonin: 25  Thank you for allowing pharmacy to be a part of this patient's care.  Pernell Dupre, PharmD, BCPS Clinical Pharmacist 03/06/2018 1:37 PM

## 2018-03-06 NOTE — Progress Notes (Signed)
Note distress but requiring HFNC 55% No new complaints  Vitals:   03/06/18 0942 03/06/18 1000 03/06/18 1100 03/06/18 1200  BP:  108/69 109/67 102/67  Pulse:  (!) 109 96 (!) 103  Resp:  19 (!) 24 (!) 27  Temp:    98.1 F (36.7 C)  TempSrc:    Axillary  SpO2: 94% 98% 97% 96%  Weight:      Height:      FiO2 55% HFNC  Frail, NAD Cognition appears intact Temporal wasting, otherwise NCAT, sclerae white No JVD noted Bibasilar crackles, no wheezes IR IR, no M NABS, soft No LE edema, extremities warm No focal neurologic deficits  BMP Latest Ref Rng & Units 03/06/2018 03/05/2018 03/05/2018  Glucose 65 - 99 mg/dL 102(H) 111(H) 104(H)  BUN 6 - 20 mg/dL 19 21(H) 22(H)  Creatinine 0.61 - 1.24 mg/dL 0.89 0.96 0.96  Sodium 135 - 145 mmol/L 137 136 136  Potassium 3.5 - 5.1 mmol/L 3.7 3.6 3.7  Chloride 101 - 111 mmol/L 110 106 107  CO2 22 - 32 mmol/L 22 23 16(L)  Calcium 8.9 - 10.3 mg/dL 7.3(L) 7.5(L) 7.5(L)   CBC Latest Ref Rng & Units 03/06/2018 03/05/2018 03/05/2018  WBC 3.8 - 10.6 K/uL 53.2(HH) 53.7(HH) 76.7(HH)  Hemoglobin 13.0 - 18.0 g/dL 8.0(L) 8.0(L) 9.7(L)  Hematocrit 40.0 - 52.0 % 24.4(L) 24.6(L) 30.5(L)  Platelets 150 - 440 K/uL 135(L) 135(L) 163   CXR: Diffuse IS prominence with bibasilar predominance  IMP: CLL, he has refused treatment for this in the past Acute hypoxemic respiratory failure Elevated PCT, presumed CAP Chronic atrial fibrillation Hypotension, presumed septic shock AKI, nonoliguric, improving Acute on chronic anemia without evidence of acute blood loss DNR  PLAN/REC: Continue BiPAP as needed Continue supplemental oxygen to maintain SPO2 >90% Continue cefepime (initiated 06/09) Add azithromycin (initiated 6/10) Continue phenylephrine to maintain MAP >60 mmHg Monitor BMET intermittently Monitor I/Os Correct electrolytes as indicated  DVT px: Chronic warfarin Monitor CBC intermittently Transfuse per usual guidelines   Patient and sons updated at  bedside  Merton Border, MD PCCM service Mobile [phone] Pager [phone] 03/06/2018 1:21 PM

## 2018-03-06 NOTE — Progress Notes (Signed)
ANTICOAGULATION CONSULT NOTE - Initial Consult  Pharmacy Consult for warfarin Indication: atrial fibrillation  No Known Allergies  Patient Measurements: Height: 5\' 7"  (170.2 cm) Weight: 130 lb 15.3 oz (59.4 kg) IBW/kg (Calculated) : 66.1  Vital Signs: Temp: 98.1 F (36.7 C) (06/10 0200) Temp Source: Axillary (06/10 0200) BP: 113/74 (06/10 0600) Pulse Rate: 103 (06/10 0600)  Labs: Recent Labs    03/05/18 0744 03/05/18 1124 03/05/18 1405 03/05/18 2127 03/06/18 0256  HGB 9.7*  --   --  8.0* 8.0*  HCT 30.5*  --   --  24.6* 24.4*  PLT 163  --   --  135* 135*  LABPROT  --  19.0*  --  22.7* 23.0*  INR  --  1.61  --  2.02 2.06  CREATININE 1.17  --  0.96 0.96 0.89  TROPONINI  --   --   --  0.03* 0.03*    Estimated Creatinine Clearance: 51.9 mL/min (by C-G formula based on SCr of 0.89 mg/dL).   Medical History: Past Medical History:  Diagnosis Date  . Arthritis   . Atrial fibrillation ([place])   . CHF (congestive heart failure) ([place])   . COPD (chronic obstructive pulmonary disease) ([place])   . Dysrhythmia   . GERD (gastroesophageal reflux disease)   . Hyperlipemia   . Hypertension   . Leukocytosis 01/14/2016  . Prostate cancer ([place])   . Stroke [facility])     Assessment: 84 year old male presents to ED due to vomiting and diarrhea which started yesterday. Per patient's daughter, patient has history of leukemia and has refused chemo/radiation. Patient on warfarin PTA for atrial fibrillation. Daughter states patient last took warfarin yesterday. Home warfarin regimen 2mg  PO every M, W F and 4mg  PO every Sun, Tues, Thurs, Sat.    Dosing History:  Date INR Dose 6/9 1.61 4mg  6/10 2.06   Goal of Therapy:  INR 2-3  Plan:  INR therapeutic this morning. Patient receiving Abx. Will continue with patient's home regimen at this time and order warfarin 2mg  this evening.   Patient is concurrently on cefepime which can increase the anticoagulant effect of warfarin. Will monitor daily  INR's while patient on antibiotics.  Continue to check CBC's at least every 3 days.   Pernell Dupre, PharmD, BCPS Clinical Pharmacist 03/06/2018 8:03 AM

## 2018-03-07 DIAGNOSIS — J189 Pneumonia, unspecified organism: Secondary | ICD-10-CM

## 2018-03-07 DIAGNOSIS — Z515 Encounter for palliative care: Secondary | ICD-10-CM

## 2018-03-07 DIAGNOSIS — Z7189 Other specified counseling: Secondary | ICD-10-CM

## 2018-03-07 LAB — COMPREHENSIVE METABOLIC PANEL
ALT: 15 U/L — AB (ref 17–63)
AST: 20 U/L (ref 15–41)
Albumin: 2.7 g/dL — ABNORMAL LOW (ref 3.5–5.0)
Alkaline Phosphatase: 61 U/L (ref 38–126)
Anion gap: 5 (ref 5–15)
BILIRUBIN TOTAL: 1 mg/dL (ref 0.3–1.2)
BUN: 15 mg/dL (ref 6–20)
CALCIUM: 7.4 mg/dL — AB (ref 8.9–10.3)
CO2: 21 mmol/L — ABNORMAL LOW (ref 22–32)
CREATININE: 0.85 mg/dL (ref 0.61–1.24)
Chloride: 109 mmol/L (ref 101–111)
GFR calc Af Amer: >60 mL/min (ref >60)
Glucose, Bld: 88 mg/dL (ref 65–99)
Potassium: 3.5 mmol/L (ref 3.5–5.1)
Sodium: 135 mmol/L (ref 135–145)
TOTAL PROTEIN: 5.5 g/dL — AB (ref 6.5–8.1)

## 2018-03-07 LAB — CBC
HEMATOCRIT: 23.7 % — AB (ref 40.0–52.0)
Hemoglobin: 7.8 g/dL — ABNORMAL LOW (ref 13.0–18.0)
MCH: 32.7 pg (ref 26.0–34.0)
MCHC: 33 g/dL (ref 32.0–36.0)
MCV: 99.1 fL (ref 80.0–100.0)
Platelets: 116 10*3/uL — ABNORMAL LOW (ref 150–440)
RBC: 2.39 MIL/uL — AB (ref 4.40–5.90)
RDW: 18.3 % — ABNORMAL HIGH (ref 11.5–14.5)
WBC: 27.4 10*3/uL — AB (ref 3.8–10.6)

## 2018-03-07 LAB — FOLATE: Folate: 15 ng/mL (ref >5.9)

## 2018-03-07 LAB — PROTIME-INR
INR: 2.17
Prothrombin Time: 24 seconds — ABNORMAL HIGH (ref 11.4–15.2)

## 2018-03-07 LAB — VITAMIN B12: Vitamin B-12: 207 pg/mL (ref 180–914)

## 2018-03-07 LAB — PROCALCITONIN: PROCALCITONIN: 8.92 ng/mL

## 2018-03-07 MED ORDER — HALOPERIDOL LACTATE 5 MG/ML IJ SOLN: 2.0000 mg | Freq: Once | INTRAMUSCULAR | Status: DC

## 2018-03-07 MED ORDER — WARFARIN SODIUM 4 MG PO TABS
4.0000 mg | ORAL_TABLET | ORAL | Status: DC
  Administered 2018-03-07: 4 mg via ORAL
  Filled 2018-03-07 (×2): qty 1

## 2018-03-07 MED ORDER — WARFARIN SODIUM 2 MG PO TABS
2.0000 mg | ORAL_TABLET | ORAL | Status: DC
  Administered 2018-03-08: 2 mg via ORAL
  Filled 2018-03-07: qty 1

## 2018-03-07 NOTE — Progress Notes (Addendum)
ANTICOAGULATION CONSULT NOTE - Initial Consult  Pharmacy Consult for warfarin Indication: atrial fibrillation  No Known Allergies  Patient Measurements: Height: 5\' 7"  (170.2 cm) Weight: 130 lb 15.3 oz (59.4 kg) IBW/kg (Calculated) : 66.1  Vital Signs: Temp: 97.8 F (36.6 C) (06/11 0200) Temp Source: Oral (06/11 0200) BP: 109/68 (06/11 0600) Pulse Rate: 123 (06/11 0600)  Labs: Recent Labs    03/05/18 2127 03/06/18 0256 03/06/18 0902 03/07/18 0553  HGB 8.0* 8.0*  --  7.8*  HCT 24.6* 24.4*  --  23.7*  PLT 135* 135*  --  116*  LABPROT 22.7* 23.0*  --  24.0*  INR 2.02 2.06  --  2.17  CREATININE 0.96 0.89  --  0.85  TROPONINI 0.03* 0.03* 0.03*  --     Estimated Creatinine Clearance: 54.4 mL/min (by C-G formula based on SCr of 0.85 mg/dL).   Medical History: Past Medical History:  Diagnosis Date  . Arthritis   . Atrial fibrillation ([place])   . CHF (congestive heart failure) ([place])   . COPD (chronic obstructive pulmonary disease) ([place])   . Dysrhythmia   . GERD (gastroesophageal reflux disease)   . Hyperlipemia   . Hypertension   . Leukocytosis 01/14/2016  . Prostate cancer ([place])   . Stroke [facility])     Assessment: 84 year old male presents to ED due to vomiting and diarrhea which started yesterday. Per patient's daughter, patient has history of leukemia and has refused chemo/radiation. Patient on warfarin PTA for atrial fibrillation. Daughter states patient last took warfarin yesterday. Home warfarin regimen 2mg  PO every M, W F and 4mg  PO every Sun, Tues, Thurs, Sat.    Dosing History:  Date INR Dose 6/9 1.61 4mg  6/10 2.06 2mg  6/11 2.17  Goal of Therapy:  INR 2-3  Plan:  INR remains therapeutic this morning. Patient receiving Abx. Will continue with patient's home regimen at this time and order warfarin 4mg  this evening.   Patient is concurrently on cefepime and azithromycin which can increase the anticoagulant effect of warfarin. Will monitor daily INR's while  patient on antibiotics.  Continue to check CBC's at least every 3 days.   Pernell Dupre, PharmD, BCPS Clinical Pharmacist 03/07/2018 7:45 AM

## 2018-03-07 NOTE — Progress Notes (Signed)
[place] [facility] [place] NAME: Thomas Townsend    MR#:  [id number]  DATE OF BIRTH:  [birth year]  SUBJECTIVE:   patient on HFNC Doing better this am    REVIEW OF SYSTEMS:    Review of Systems  Constitutional: Negative for fever, chills weight loss + weakness  HENT: Negative for ear pain, nosebleeds, congestion, facial swelling, rhinorrhea, neck pain, neck stiffness and ear discharge.   Respiratory: ++ for cough, shortness of breath, no wheezing  Cardiovascular: Negative for chest pain, palpitations and leg swelling.  Gastrointestinal: Negative for heartburn, abdominal pain, vomiting, diarrhea or consitpation Genitourinary: Negative for dysuria, urgency, frequency, hematuria Musculoskeletal: Negative for back pain or joint pain Neurological: Negative for dizziness, seizures, syncope, focal weakness,  numbness and headaches.  Hematological: Does not bruise/bleed easily.  Psychiatric/Behavioral: Negative for hallucinations, confusion, dysphoric mood    Tolerating Diet: yes      DRUG ALLERGIES:  No Known Allergies  VITALS:  Blood pressure 108/63, pulse 93, temperature 98.4 F (36.9 C), temperature source Oral, resp. rate (!) 28, height 5\' 7"  (1.702 m), weight 59.4 kg (130 lb 15.3 oz), SpO2 93 %.  PHYSICAL EXAMINATION:  Constitutional: Appears well-developed and well-nourished. No distress. HENT: Normocephalic. . On bipap Eyes: Conjunctivae and EOM are normal. PERRLA, no scleral icterus.  Neck: Normal ROM. Neck supple. No JVD. No tracheal deviation. CVS: RRR, S1/S2 +, no murmurs, no gallops, no carotid bruit.  Pulmonary: Normal respiratory effort with bilateral crackles at bases  abdominal: Soft. BS +,  no distension, tenderness, rebound or guarding.  Musculoskeletal: Normal range of motion. No edema and no tenderness.  Neuro: Alert. . No focal deficits. Skin: Skin is warm and dry. No rash noted. Psychiatric: Normal mood and affect.       LABORATORY PANEL:   CBC Recent Labs  Lab 03/07/18 0553  WBC 27.4*  HGB 7.8*  HCT 23.7*  PLT 116*   ------------------------------------------------------------------------------------------------------------------  Chemistries  Recent Labs  Lab 03/05/18 2127  03/07/18 0553  NA 136   < > 135  K 3.6   < > 3.5  CL 106   < > 109  CO2 23   < > 21*  GLUCOSE 111*   < > 88  BUN 21*   < > 15  CREATININE 0.96   < > 0.85  CALCIUM 7.5*   < > 7.4*  MG 2.2  --   --   AST  --   --  20  ALT  --   --  15*  ALKPHOS  --   --  61  BILITOT  --   --  1.0   < > = values in this interval not displayed.   ------------------------------------------------------------------------------------------------------------------  Cardiac Enzymes Recent Labs  Lab 03/05/18 2127 03/06/18 0256 03/06/18 0902  TROPONINI 0.03* 0.03* 0.03*   ------------------------------------------------------------------------------------------------------------------  RADIOLOGY:  Dg Chest Port 1 View  Result Date: 03/06/2018 CLINICAL DATA:  Increased respiratory failure, history of CHF and COPD, prostate carcinoma, former smoking history EXAM: PORTABLE CHEST 1 VIEW COMPARISON:  Chest x-ray of 03/02/2018 and 12/26/2017 FINDINGS: Although there are chronic markings at the lung bases when compared to prior images, there are more prominent interstitial markings not present diffusely. This pattern most likely represents interstitial edema superimposed on chronic change. Small effusions may be present. Cardiomegaly is stable. IMPRESSION: 1. Increase in interstitial markings diffusely most consistent with interstitial edema. Possible small effusions. 2. Stable mild cardiomegaly and chronic interstitial change. Electronically  Signed   By: Ivar Drape M.D.   On: 03/06/2018 09:28   [place] Ekg Site Rite  Result Date: 03/05/2018 If Site Rite image not attached, placement could not be confirmed due to current cardiac  rhythm.    ASSESSMENT AND PLAN:    84 year old male with history of essential hypertension hyperlipidemia, PAF  who presented to the emergency room due to nausea vomiting.  1.  Acute hypoxic respiratory failure in the setting of pneumonia Continue cefepime and azithromycin Weaned on BiPAP to high flow nasal cannula Intensivist consult appreciated. 2.  Sepsis due to multilobar pneumonia: Continue cefepime and azithromycin. Appreciate intensivist consult Lactic acid improving Procalcitonin level is improving 3.  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  White blood cell count has improved which suggests this is reactive due to pneumonia  4. Macrocytic Anemia: Hemoglobin is dropped today.  No indication for blood transfusion however if it is less than 7 then may benefit.  5.  PAF: Pharmacy dosing Coumadin would monitor hemoglobin closely continue digoxin   6.  Hypotension: Patient is asymptomatic and seems to be at baseline  7.  Depression: Continue Celexa  8.  Electrolyte abnormalities: Replete PRN  9.  Arthritis: Continue methotrexate  Management plans discussed with the patient and he is CODE STATUS: DNR  TOTAL TIME TAKING CARE OF THIS PATIENT: 25 minutes.  Palliative care consultation placed   POSSIBLE D/C 2-3 days, DEPENDING ON CLINICAL CONDITION.   Milburn Freeney M.D on 03/07/2018 at 11:14 AM  Between 7am to 6pm - Pager - [phone] After 6pm go to [email or web address] - password [email or web address] [facility]  Office  [phone]  CC: Primary care physician; Elby Beck, FNP  Note: This dictation was prepared with Dragon dictation along with smaller phrase technology. Any transcriptional errors that result from this process are unintentional.

## 2018-03-07 NOTE — Consult Note (Signed)
Consultation Note Date: 03/07/2018   Patient Name: Thomas Townsend  DOB: 12/01/1930  MRN: [id number]  Age / Sex: 84 y.o., male  PCP: Gessner, Deborah B, FNP Referring Physician: Mody, Sital, MD  Reason for Consultation: Establishing goals of care  HPI/Patient Profile: 84 y.o. male  with past medical history of CHF, afib, COPD, prostate CA, RA, HLD, HTN, stroke and ?leukemia admitted on 03/05/2018 with nausea, vomiting, and diarrhea.He was diagnosed with pna and sepsis - possibly aspiration? He was recently admitted to the hospital in April for the same - HCAP and sepsis. Patient has been on bipap and HFNC. Also required pressor support - still on 3 mcg neo.   Per oncology, pt has been watched for many years with a suspicion of underlying myeloproliferative neoplasm, such as CMML; however, this has never been confirmed. A bone marrow biopsy is recommended to confirm to CMML, however even if CMML diagnosis is confirmed,  there is no cure at patient's age and with his multiple comorbidities. Family agrees to no biopsy.  Patient has been seen by PMT before and is followed by outpatient palliative care. PMT consulted for GOC.    Clinical Assessment and Goals of Care: I have reviewed medical records including EPIC notes, labs and imaging, received report from Dr. Mody and bedside RN, assessed the patient and then met with patient's daughter to discuss diagnosis prognosis, GOC, EOL wishes, disposition and options.  I introduced Palliative Medicine as specialized medical care for people living with serious illness. It focuses on providing relief from the symptoms and stress of a serious illness. The goal is to improve quality of life for both the patient and the family.  We discussed a brief life review of the patient. 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 He has 4 children and is widowed. One of patient's sons lives with  him.   As far as functional and nutritional status, his daughter tells me of a significant decline. She does not think he is eating well. Chart review does not reveal weight loss but daughter feels that he has lost weight. She tells me of a functional decline as well - not as active as he once was. His vision and hearing are also declining and she feels this is impacting his function. She tells me of periods of confusion - wakes up in the middle of the night and talks about talking to people who are deceased.   She tells me patient is frequently confused since he has been admitted - telling her about being on an airplane, speaking to people who aren't there. We discussed ICU delirium. Maybe dementia given some episodes at home as well. He told me this morning he ate a full breakfast - per daughter this is not true - he only ate a few bites.    We discussed his current illness and what it means in the larger context of his on-going co-morbidities.  Natural disease trajectory and expectations at EOL were discussed. Specifically we discussed patient's multiple hospitalizations r/t pna. Discussed possible aspiration component.    Donna tells me she does not feel as though the patient has an acceptable quality of life. She feels as though he is ready for EOL. She believes her brothers feel the same.     The difference between aggressive medical intervention and comfort care was considered in light of the patient's goals of care. She wants to see how the patient does over   the next day or two but is interested in focusing on comfort.  Advanced directives, concepts specific to code status, artifical feeding and hydration, and rehospitalization were considered and discussed. She is HCPOA. Confirmed DNR. Patient would not want artificial feeding.   Hospice and Palliative Care services outpatient were explained and offered. They currently have outpatient palliative, interested in hospice.   Questions and  concerns were addressed. The family was encouraged to call with questions or concerns.   Primary Decision Maker PATIENT - has periods of confusions - joined by his daughter [name] who is HCPOA.    SUMMARY OF RECOMMENDATIONS   - await SLP eval - aspiration? -see how patient does over next 1-2 days - potentially shift to comfort depending on response to treatment -interested in hospice care - PMT will see again tomorrow and follow up with family  Code Status/Advance Care Planning:  DNR  Symptom Management:   Per primary - symptoms currently controlled per patient  She tells me patient has periods of agitation - patient is calm during my evaluation, no reports of agitation from nursing  Psycho-social/Spiritual:   Desire for further Chaplaincy support:no  Additional Recommendations: Education on Hospice  Prognosis:   Unable to determine - poor prognosis r/t multiple comorbidities, depends on response to current tx  Discharge Planning: To Be Determined      Primary Diagnoses: Present on Admission: . Sepsis ([place])   I have reviewed the medical record, interviewed the patient and family, and examined the patient. The following aspects are pertinent.  Past Medical History:  Diagnosis Date  . Arthritis   . Atrial fibrillation ([place])   . CHF (congestive heart failure) ([place])   . COPD (chronic obstructive pulmonary disease) ([place])   . Dysrhythmia   . GERD (gastroesophageal reflux disease)   . Hyperlipemia   . Hypertension   . Leukocytosis 01/14/2016  . Prostate cancer ([place])   . Stroke [facility])    Social History   Socioeconomic History  . Marital status: Widowed    Spouse name: Not on file  . Number of children: Not on file  . Years of education: Not on file  . Highest education level: Not on file  Occupational History  . Not on file  Social Needs  . Financial resource strain: Not on file  . Food insecurity:    Worry: Not on file    Inability: Not on file  .  Transportation needs:    Medical: Not on file    Non-medical: Not on file  Tobacco Use  . Smoking status: Former Smoker    Years: 20.00    Types: Cigarettes  . Smokeless tobacco: Never Used  Substance and Sexual Activity  . Alcohol use: Yes    Alcohol/week: 0.6 oz    Types: 1 Cans of beer per week    Comment: Wine every once in a while  . Drug use: No  . Sexual activity: Never  Lifestyle  . Physical activity:    Days per week: Not on file    Minutes per session: Not on file  . Stress: Not on file  Relationships  . Social connections:    Talks on phone: Not on file    Gets together: Not on file    Attends religious service: Not on file    Active member of club or organization: Not on file    Attends meetings of clubs or organizations: Not on file    Relationship status: Not on file  Other  Topics Concern  . Not on file  Social History Narrative   ** Merged History Encounter **       Family History  Problem Relation Age of Onset  . Hypertension Other    Scheduled Meds: . citalopram  20 mg Oral Daily  . digoxin  0.125 mg Oral BH-q7a  . ferrous sulfate  325 mg Oral q morning - 10a  . fluticasone furoate-vilanterol  1 puff Inhalation BH-q7a  . folic acid  1 mg Oral Daily  . methotrexate  2.5 mg Oral Weekly  . [START ON 03/08/2018] warfarin  2 mg Oral Once per day on Mon Wed Fri   And  . warfarin  4 mg Oral Once per day on Sun Tue Thu Sat  . Warfarin - Pharmacist Dosing Inpatient   Does not apply q1800   Continuous Infusions: . azithromycin Stopped (03/06/18 1140)  . ceFEPime (MAXIPIME) IV Stopped (03/07/18 0936)  . phenylephrine (NEO-SYNEPHRINE) Adult infusion Stopped (03/06/18 1500)   PRN Meds:.acetaminophen **OR** [DISCONTINUED] acetaminophen, HYDROcodone-acetaminophen, ipratropium-albuterol, [DISCONTINUED] ondansetron **OR** ondansetron (ZOFRAN) IV, senna-docusate No Known Allergies Review of Systems  Unable to perform ROS: Acuity of condition    Physical Exam   Constitutional: He is oriented to person, place, and time. He is cooperative.  Periods of confusion  HENT:  Head: Normocephalic and atraumatic.  Cardiovascular: An irregular rhythm present.  Pulmonary/Chest: Effort normal and breath sounds normal. No accessory muscle usage. No tachypnea. No respiratory distress.  On HFNC  Abdominal: Soft. Bowel sounds are normal.  Musculoskeletal:       Right lower leg: He exhibits no edema.       Left lower leg: He exhibits no edema.  Neurological: He is alert and oriented to person, place, and time.  Skin: Skin is warm and dry.    Vital Signs: BP 116/80   Pulse 93   Temp 98.4 F (36.9 C) (Oral)   Resp 19   Ht 5' 7" (1.702 m)   Wt 59.4 kg (130 lb 15.3 oz)   SpO2 96%   BMI 20.51 kg/m  Pain Scale: CPOT   Pain Score: Asleep   SpO2: SpO2: 96 % O2 Device:SpO2: 96 % O2 Flow Rate: .O2 Flow Rate (L/min): 45 L/min  IO: Intake/output summary:   Intake/Output Summary (Last 24 hours) at 03/07/2018 0941 Last data filed at 03/07/2018 0930 Gross per 24 hour  Intake 2222.63 ml  Output 1565 ml  Net 657.63 ml    LBM: Last BM Date: 03/06/18 Baseline Weight: Weight: 56.7 kg (124 lb 14.4 oz) Most recent weight: Weight: 59.4 kg (130 lb 15.3 oz)     Palliative Assessment/Data: PPS 40%     Time Total: 70 minutes Greater than 50%  of this time was spent counseling and coordinating care related to the above assessment and plan.   Lee , DNP, AGNP-C Palliative Medicine Team [phone] Pager: [phone] 

## 2018-03-07 NOTE — Evaluation (Addendum)
Clinical/Bedside Swallow Evaluation Patient Details  Name: Thomas Townsend MRN: [id number] Date of Birth: October 27, 1930  Today's Date: 03/07/2018 Time: SLP Start Time (ACUTE ONLY): 1500 SLP Stop Time (ACUTE ONLY): 1600 SLP Time Calculation (min) (ACUTE ONLY): 60 min  Past Medical History:  Past Medical History:  Diagnosis Date  . Arthritis   . Atrial fibrillation ([place])   . CHF (congestive heart failure) ([place])   . COPD (chronic obstructive pulmonary disease) ([place])   . Dysrhythmia   . GERD (gastroesophageal reflux disease)   . Hyperlipemia   . Hypertension   . Leukocytosis 01/14/2016  . Prostate cancer ([place])   . Stroke [facility])    Past Surgical History:  Past Surgical History:  Procedure Laterality Date  . CHOLECYSTECTOMY    . COLONOSCOPY WITH PROPOFOL N/A 04/04/2015   Procedure: COLONOSCOPY WITH PROPOFOL;  Surgeon: Manya Silvas, MD;  Location: [facility] ENDOSCOPY;  Service: Endoscopy;  Laterality: N/A;  . ESOPHAGOGASTRODUODENOSCOPY N/A 04/04/2015   Procedure: ESOPHAGOGASTRODUODENOSCOPY (EGD);  Surgeon: Manya Silvas, MD;  Location: [facility] ENDOSCOPY;  Service: Endoscopy;  Laterality: N/A;  . EYE SURGERY     HPI:  Pt is a 84 y.o. male  with past medical history of CHF, afib, COPD, GERD, prostate CA, RA, HLD, HTN, stroke and CLL (chronic lymphocytic leukemia) admitted on 03/05/2018 with nausea, vomiting, and diarrhea.He was diagnosed with pna and sepsis - possibly aspiration - of vomit(?). He was recently admitted to the hospital in April for the same - HCAP and sepsis. Patient has been on bipap and HFNC, now [place] support. Per oncology, pt has been watched for many years with a suspicion of underlying myeloproliferative neoplasm, such as CMML; however, this has never been confirmed. A bone marrow biopsy is recommended to confirm to CMML, however even if CMML diagnosis is confirmed, there is no cure at patient's age and with his multiple comorbidities. Family agrees to no biopsy. Currrentlyl, patient  is followed by outpatient Palliative at home.  NSG denied any overt issues w/ swallowing - no overt coughing w/ meals/Pills today reported. Pt denies same but unsure of pt's insight and baseline Cognitive status(this concern of his Cognitive status was mentioned occurring prior to this hospitalization as well). It does appear pt's overall oral intake may be less in recent time; noted his weight. Pt has a baseline of GERD - any N/V or Reflux behavior can increase risk for aspiration of the material during an episode and impact the Pulmonary status. Noted recent CXRs indicating interstitial markings diffusely most consistent with interstitial edema(03/06/18).   Assessment / Plan / Recommendation Clinical Impression  Pt appears to present w/ adequate oropharyngeal phase swallow function w/ toleration of trials of thin liquids via Cup/Straw, purees and a bite of mech soft food moistened. NO immediate, overt s/s of aspiration noted w/ the few po trials accepted by pt - pt had to be encouraged by SLP to take the po's he accepted as he often declined stating he was "fine" or didn't want any "right now but maybe later". Pt exhibited no decline in respiratory status or vocal quality during/post the po trials presented; O2 sats remained mid-90s. Oral phase was grossly [facility] for bolus management and clearing though mech soft consistency was utilized and time given d/t NO bottom dentures in place(pt stated they were ill-fitting which can occur when someone loses weight). Pt appears at reduced risk for aspiration from an oropharyngeal phase standpoint. Pt does have a baseline of GERD which any regurgitation/Reflux can increase risk  for aspiration of Reflux material thus leading to pulmonary issues. Pt required mod.+ verbal encouragement w/ oral intake - d/t Cognitive status, illness(?). He was able to hold Cup for self-drinking. SLP recommended a Dysphagia level 3 (cut meats, moist foods) for easier mastication d/t no bottom  dentition w/ Thin liquids. Recommended general aspiration and Reflux precautions d/t GERD. Recommend w/ Pill taking use of a puree or ice cream for easier swallowing as needed. ST services will be available for any further education if needed while admitted. Recommend f/u w/ Dietician as needed for nutritional support - drink form may be easier for pt.  SLP Visit Diagnosis: Dysphagia, unspecified (R13.10)(min oral phase d/t no bottom dentures)    Aspiration Risk  (reduced following general aspiration precautions)    Diet Recommendation  Mech Soft diet (meats cut, moistened foods d/t no bottom dentition); Thin liquids. General aspiration and REFLUX precautions.  Monitoring and support at meals; nutritional supplements as determined by Dietitian.  Medication Administration: Whole meds with puree(for safer, easier swallowing)    Other  Recommendations Recommended Consults: (Dietitian, GI f/u) Oral Care Recommendations: Oral care BID;Staff/trained caregiver to provide oral care Other Recommendations: (n/a)   Follow up Recommendations None      Frequency and Duration (n/a)  (n/a)       Prognosis Prognosis for Safe Diet Advancement: Fair(-Good) Barriers to Reach Goals: Cognitive deficits      Swallow Study   General Date of Onset: 03/05/18 HPI: Pt is a 84 y.o. male  with past medical history of CHF, afib, COPD, GERD, prostate CA, RA, HLD, HTN, stroke and CLL (chronic lymphocytic leukemia) admitted on 03/05/2018 with nausea, vomiting, and diarrhea.He was diagnosed with pna and sepsis - possibly aspiration? He was recently admitted to the hospital in April for the same - HCAP and sepsis. Patient has been on bipap and HFNC now [place] support. Per oncology, pt has been watched for many years with a suspicion of underlying myeloproliferative neoplasm, such as CMML; however, this has never been confirmed. A bone marrow biopsy is recommended to confirm to CMML, however even if CMML diagnosis is confirmed,   there is no cure at patient's age and with his multiple comorbidities. Family agrees to no biopsy. Currrentlyl, patient is followed by outpatient Palliative at home.  NSG deny any ongoing issues w/ swallowing - no overt coughing w/ meals today reported. Pt denies same but unusre of pt's insight and baseline Cognitive status(this concern of his Cognitive status was mentioned prior to this hospitalization as well). It does appear pt's overall oral intake may be less in recent time; noted his weight.  Type of Study: Bedside Swallow Evaluation Previous Swallow Assessment: none reported Diet Prior to this Study: Regular;Thin liquids Temperature Spikes Noted: No(wbc 27.4) Respiratory Status: Nasal cannula(4 liters) History of Recent Intubation: No(has been on HFNC for support) Behavior/Cognition: Alert;Cooperative;Pleasant mood;Confused;Distractible;Requires cueing(HOH) Oral Cavity Assessment: Within Functional Limits Oral Care Completed by SLP: Recent completion by staff Oral Cavity - Dentition: Dentures, top(edentulous on bottom) Vision: Functional for self-feeding Self-Feeding Abilities: Able to feed self;Needs assist;Needs set up;Total assist(needed encouragement) Patient Positioning: Upright in bed Baseline Vocal Quality: Normal Volitional Cough: Strong Volitional Swallow: Able to elicit    Oral/Motor/Sensory Function Overall Oral Motor/Sensory Function: Within functional limits   Ice Chips Ice chips: Within functional limits Presentation: Spoon(fed; 2 trials)   Thin Liquid Thin Liquid: Within functional limits Presentation: Cup;Self Fed;Straw(3 trials via straw; 2 via cup) Other Comments: needed encouragement    Nectar Thick Nectar  Thick Liquid: Not tested   Honey Thick Honey Thick Liquid: Not tested   Puree Puree: Within functional limits Presentation: Spoon(fed; 2 trials) Other Comments: needed encouragement   Solid   GO   Solid: Impaired(mech soft trial) Presentation: Spoon(fed;  1 trial accepted) Oral Phase Impairments: Impaired mastication(mmissing bottom dentition) Oral Phase Functional Implications: Impaired mastication Pharyngeal Phase Impairments: (none) Other Comments: pt stated his bottom denture plate did not fit well        Orinda Kenner, MS, CCC-SLP Jabin Tapp 03/07/2018,6:02 PM

## 2018-03-07 NOTE — Progress Notes (Signed)
No distress on [place] O2, 4 LPM Off vasopressors Mildly confused  Vitals:   03/07/18 0900 03/07/18 1000 03/07/18 1100 03/07/18 1200  BP: 116/80 127/71 108/63 102/67  Pulse: 93 93 93 86  Resp: 19 (!) 23 (!) 28 20  Temp:    98.4 F (36.9 C)  TempSrc:    Oral  SpO2: 96% 92% 93% 90%  Weight:      Height:      4 LPM   Frail, NAD Mildly confused Temporal wasting, otherwise WNL No JVD Bibasilar crackles, no wheezes IR IR, no M Abdomen soft, NABS Extremities warm, no edema No focal neurologic deficits  BMP Latest Ref Rng & Units 03/07/2018 03/06/2018 03/05/2018  Glucose 65 - 99 mg/dL 88 102(H) 111(H)  BUN 6 - 20 mg/dL 15 19 21(H)  Creatinine 0.61 - 1.24 mg/dL 0.85 0.89 0.96  Sodium 135 - 145 mmol/L 135 137 136  Potassium 3.5 - 5.1 mmol/L 3.5 3.7 3.6  Chloride 101 - 111 mmol/L 109 110 106  CO2 22 - 32 mmol/L 21(L) 22 23  Calcium 8.9 - 10.3 mg/dL 7.4(L) 7.3(L) 7.5(L)   CBC Latest Ref Rng & Units 03/07/2018 03/06/2018 03/05/2018  WBC 3.8 - 10.6 K/uL 27.4(H) 53.2(HH) 53.7(HH)  Hemoglobin 13.0 - 18.0 g/dL 7.8(L) 8.0(L) 8.0(L)  Hematocrit 40.0 - 52.0 % 23.7(L) 24.4(L) 24.6(L)  Platelets 150 - 440 K/uL 116(L) 135(L) 135(L)   CXR: No new film  IMP: CLL, he has refused treatment for this in the past Acute hypoxemic respiratory failure Elevated PCT, suspected CAP Chronic atrial fibrillation Hypotension, resolved AKI, resolved Acute on chronic anemia without evidence of acute blood loss Mild thrombocytopenia DNR  PLAN/REC: Continue supplemental oxygen to maintain SPO2 >90% Continue cefepime (initiated 06/09) Continue azithromycin (initiated 6/10) Monitor BMET intermittently Monitor I/Os Correct electrolytes as indicated  Continue chronic warfarin for AMS Monitor CBC intermittently Transfer to MedSurg with cardiac monitoring After transfer, PCCM will sign off. Please call if we can be of further assistance  Merton Border, MD PCCM service Mobile [phone] Pager  [phone] 03/07/2018 2:01 PM

## 2018-03-07 NOTE — Evaluation (Signed)
Physical Therapy Evaluation Patient Details Name: Thomas Townsend MRN: [id number] DOB: 10-22-1930 Today's Date: 03/07/2018   History of Present Illness  Pt is an 84 y.o. male with a known history of chronic atrial fibrillation on anticoagulation and COPD not on oxygen who presented to the emergency room via EMS due to nausea, vomiting, and diarrhea.   Family reported that patient was in his usual state of health but when patient's son went to check on him he was covered in feces.  He was brought to the ER for further evaluation.  In the emergency room chest x-ray was concerning for pneumonia.  He was also found to be hypoxic.  He was started on broad-spectrum antibiotics.  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      Clinical Impression  Pt presents with deficits in strength, transfers, mobility, gait, balance, and activity tolerance.  Pt was Mod Ind with bed mobility tasks with extra time and effort required.  Pt's SpO2 at rest on 4.5LO2/min was 92-93%.  Upon sitting up to the EOB SpO2 dropped to the upper 80s but improved back to baseline with cues for PLB.  Pt tolerated seated and supine therex similarly with SpO2 occasionally dropping into the upper 80s but improving back to the low 90s with cues for PLB.  Upon standing and taking several small steps near the EOB pt's SpO2 dropped to the upper 70s and upon sitting with cues for PLB SpO2 took around 30 sec to gradually return back to the upper 80s/low 90s, nursing notified.  Pt will benefit from PT services in a SNF setting upon discharge to safely address above deficits for decreased caregiver assistance and eventual return to PLOF.      Follow Up Recommendations SNF;Supervision for mobility/OOB    Equipment Recommendations  Other (comment)(TBD at next venue of care)     Recommendations for Other Services       Precautions / Restrictions Precautions Precautions: Fall Precaution Comments: Watch O2 Restrictions Weight Bearing Restrictions: No      Mobility  Bed Mobility Overal bed mobility: Modified Independent             General bed mobility comments: Extra time and effort required for bed mobility tasks  Transfers Overall transfer level: Needs assistance Equipment used: Rolling walker (2 wheeled) Transfers: Sit to/from Stand Sit to Stand: Min guard         General transfer comment: Min verbal cues for sequencing  Ambulation/Gait Ambulation/Gait assistance: Min guard Ambulation Distance (Feet): 2 Feet Assistive device: Rolling walker (2 wheeled) Gait Pattern/deviations: Step-through pattern;Decreased step length - left;Decreased step length - right     General Gait Details: SpO2 dropped into the upper 70s with amb, pt returned to sitting with cues for PLB with SpO2 returning to low 90s after around 30 sec  Stairs            Wheelchair Mobility    Modified Rankin (Stroke Patients Only)       Balance Overall balance assessment: Mild deficits observed, not formally tested                                           Pertinent Vitals/Pain Pain Assessment: No/denies pain    Home Living Family/patient expects to be discharged  to:: Private residence Living Arrangements: Children Available Help at Discharge: Family;Available PRN/intermittently Type of Home: House Home Access: Stairs to enter Entrance Stairs-Rails: Right;Left;Can reach both Entrance Stairs-Number of Steps: 3 Home Layout: One level Home Equipment: Walker - 4 wheels;Cane - single point      Prior Function Level of Independence: Independent         Comments: Ind with amb limited community distances, Ind with ADLs, no fall history     Hand Dominance        Extremity/Trunk Assessment   Upper Extremity Assessment Upper  Extremity Assessment: Generalized weakness    Lower Extremity Assessment Lower Extremity Assessment: Generalized weakness       Communication   Communication: HOH  Cognition Arousal/Alertness: Awake/alert Behavior During Therapy: WFL for tasks assessed/performed Overall Cognitive Status: Within Functional Limits for tasks assessed                                        General Comments      Exercises Total Joint Exercises Ankle Circles/Pumps: AROM;Both;5 reps;10 reps Quad Sets: Strengthening;Both;5 reps;10 reps Gluteal Sets: Strengthening;Both;10 reps Short Arc Quad: AROM;Both;10 reps Hip ABduction/ADduction: AAROM;Both;10 reps Straight Leg Raises: AAROM;Both;10 reps Long Arc Quad: AROM;Both;10 reps   Assessment/Plan    PT Assessment Patient needs continued PT services  PT Problem List Decreased strength;Decreased activity tolerance;Decreased balance;Decreased mobility       PT Treatment Interventions DME instruction;Gait training;Stair training;Functional mobility training;Balance training;Therapeutic exercise;Therapeutic activities;Patient/family education    PT Goals (Current goals can be found in the Care Plan section)  Acute Rehab PT Goals Patient Stated Goal: To get back home PT Goal Formulation: With patient Time For Goal Achievement: 03/20/18 Potential to Achieve Goals: Good    Frequency Min 2X/week   Barriers to discharge Inaccessible home environment;Decreased caregiver support      Co-evaluation               AM-PAC PT "6 Clicks" Daily Activity  Outcome Measure Difficulty turning over in bed (including adjusting bedclothes, sheets and blankets)?: A Little Difficulty moving from lying on back to sitting on the side of the bed? : A Little Difficulty sitting down on and standing up from a chair with arms (e.g., wheelchair, bedside commode, etc,.)?: Unable Help needed moving to and from a bed to chair (including a wheelchair)?: A  Little Help needed walking in hospital room?: Total Help needed climbing 3-5 steps with a railing? : Total 6 Click Score: 12    End of Session Equipment Utilized During Treatment: Gait belt;Oxygen Activity Tolerance: Treatment limited secondary to medical complications (Comment)(Pt limited by drop in SpO2 into the upper 70s with ambulation) Patient left: in bed;with bed alarm set;with family/visitor present;with call bell/phone within reach Nurse Communication: Mobility status;Other (comment)(SpO2 response to activity) PT Visit Diagnosis: Muscle weakness (generalized) (M62.81);Difficulty in walking, not elsewhere classified (R26.2)    Time: 3785-8850 PT Time Calculation (min) (ACUTE ONLY): 29 min   Charges:   PT Evaluation $PT Eval Moderate Complexity: 1 Mod PT Treatments $Therapeutic Exercise: 8-22 mins   PT G Codes:        DRoyetta Asal PT, DPT 03/07/18, 1:06 PM

## 2018-03-07 NOTE — Progress Notes (Signed)
Hematology/Oncology Progress Note [facility] [phone] [phone]  Patient Care Team: Elby Beck, FNP as PCP - General (Nurse Practitioner) Allyne Gee, MD (Internal Medicine)   Name of the patient: Thomas Townsend  [id number]  82-Jan-1932  Date of visit: 03/07/18   INTERVAL HISTORY-  Patient was seen and examined.  Patient's son at bedside.  Patient is on nasal cannula oxygen.  Clinically improving.  Reports breathing better..  Review of systems- Review of Systems  Unable to perform ROS: Critical illness    No Known Allergies  Patient Active Problem List   Diagnosis Date Noted  . Goals of care, counseling/discussion   . Palliative care by specialist   . Elevated lactic acid level   . Pneumonia due to infectious organism   . Long term (current) use of anticoagulants 01/11/2018  . Hypertension 07/14/2017  . Altered mental status   . Palliative care encounter   . CLL (chronic lymphocytic leukemia) ([place]) 04/12/2017  . Acute metabolic encephalopathy 32/44/0102  . Aspiration pneumonia ([place]) 03/22/2017  . Right upper lobe pneumonia ([place]) 10/05/2016  . Chronic neutrophilia 08/26/2016  . Iron deficiency anemia due to chronic blood loss 08/26/2016  . Myeloproliferative neoplasm ([place]) 06/25/2016  . Anemia due to other cause 06/25/2016  . Diarrhea 02/26/2016  . Leukocytosis 01/14/2016  . Acidosis, lactic 08/11/2015  . Acute respiratory failure with hypoxia ([place]) 08/11/2015  . Pulmonary hypertension ([place]) 08/11/2015  . Mild aortic stenosis 08/11/2015  . Generalized weakness 08/11/2015  . Anemia of chronic disease 08/11/2015  . Sepsis ([place]) 08/06/2015  . Atrial fibrillation with rapid ventricular response ([place]) 08/06/2015  . CAP (community acquired pneumonia) 08/06/2015  . Rheumatoid arthritis involving multiple joints ([place]) 01/28/2015  . CAFL (chronic airflow limitation) ([place]) 03/04/2014  . Acid reflux 03/04/2014  .  Chronic diastolic CHF (congestive heart failure) ([place]) 03/04/2014  . Atrial fibrillation ([place]) 03/04/2014  . COPD (chronic obstructive pulmonary disease) ([place]) 03/04/2014     Past Medical History:  Diagnosis Date  . Arthritis   . Atrial fibrillation ([place])   . CHF (congestive heart failure) ([place])   . COPD (chronic obstructive pulmonary disease) ([place])   . Dysrhythmia   . GERD (gastroesophageal reflux disease)   . Hyperlipemia   . Hypertension   . Leukocytosis 01/14/2016  . Prostate cancer ([place])   . Stroke [facility])      Past Surgical History:  Procedure Laterality Date  . CHOLECYSTECTOMY    . COLONOSCOPY WITH PROPOFOL N/A 04/04/2015   Procedure: COLONOSCOPY WITH PROPOFOL;  Surgeon: Manya Silvas, MD;  Location: [facility] ENDOSCOPY;  Service: Endoscopy;  Laterality: N/A;  . ESOPHAGOGASTRODUODENOSCOPY N/A 04/04/2015   Procedure: ESOPHAGOGASTRODUODENOSCOPY (EGD);  Surgeon: Manya Silvas, MD;  Location[facility] ENDOSCOPY;  Service: Endoscopy;  Laterality: N/A;  . EYE SURGERY      Social History   Socioeconomic History  . Marital status: Widowed    Spouse name: Not on file  . Number of children: Not on file  . Years of education: Not on file  . Highest education level: Not on file  Occupational History  . Not on file  Social Needs  . Financial resource strain: Not on file  . Food insecurity:    Worry: Not on file    Inability: Not on file  . Transportation needs:    Medical: Not on file    Non-medical: Not on file  Tobacco Use  . Smoking status: Former Smoker    Years: 20.00  Types: Cigarettes  . Smokeless tobacco: Never Used  Substance and Sexual Activity  . Alcohol use: Yes    Alcohol/week: 0.6 oz    Types: 1 Cans of beer per week    Comment: Wine every once in a while  . Drug use: No  . Sexual activity: Never  Lifestyle  . Physical activity:    Days per week: Not on file    Minutes per session: Not on file  . Stress: Not on file  Relationships  . Social  connections:    Talks on phone: Not on file    Gets together: Not on file    Attends religious service: Not on file    Active member of club or organization: Not on file    Attends meetings of clubs or organizations: Not on file    Relationship status: Not on file  . Intimate partner violence:    Fear of current or ex partner: Not on file    Emotionally abused: Not on file    Physically abused: Not on file    Forced sexual activity: Not on file  Other Topics Concern  . Not on file  Social History Narrative   ** Merged History Encounter **         Family History  Problem Relation Age of Onset  . Hypertension Other      Current Facility-Administered Medications:  .  acetaminophen (TYLENOL) tablet 650 mg, 650 mg, Oral, Q6H PRN **OR** [DISCONTINUED] acetaminophen (TYLENOL) suppository 650 mg, 650 mg, Rectal, Q6H PRN, Mody, Sital, MD .  azithromycin (ZITHROMAX) 500 mg in sodium chloride 0.9 % 250 mL IVPB, 500 mg, Intravenous, Q24H, Wilhelmina Mcardle, MD, Stopped at 03/07/18 1235 .  ceFEPIme (MAXIPIME) 2 g in sodium chloride 0.9 % 100 mL IVPB, 2 g, Intravenous, Q12H, Hallaji, Dani Gobble, RPH, Stopped at 03/07/18 0936 .  citalopram (CELEXA) tablet 20 mg, 20 mg, Oral, Daily, Mody, Sital, MD, 20 mg at 03/07/18 0854 .  digoxin (LANOXIN) tablet 0.125 mg, 0.125 mg, Oral, BH-q7a, Mody, Sital, MD, 0.125 mg at 03/07/18 0741 .  ferrous sulfate tablet 325 mg, 325 mg, Oral, q morning - 10a, Mody, Sital, MD, 325 mg at 03/07/18 0854 .  fluticasone furoate-vilanterol (BREO ELLIPTA) 100-25 MCG/INH 1 puff, 1 puff, Inhalation, Mechele Dawley, Sital, MD, 1 puff at 03/07/18 0743 .  folic acid (FOLVITE) tablet 1 mg, 1 mg, Oral, Daily, Mody, Sital, MD, 1 mg at 03/07/18 0853 .  HYDROcodone-acetaminophen (NORCO/VICODIN) 5-325 MG per tablet 1-2 tablet, 1-2 tablet, Oral, Q4H PRN, Mody, Sital, MD .  ipratropium-albuterol (DUONEB) 0.5-2.5 (3) MG/3ML nebulizer solution 3 mL, 3 mL, Nebulization, Q4H PRN, Wilhelmina Mcardle,  MD .  methotrexate (RHEUMATREX) tablet 2.5 mg, 2.5 mg, Oral, Weekly, Mody, Sital, MD, 2.5 mg at 03/05/18 1350 .  [DISCONTINUED] ondansetron (ZOFRAN) tablet 4 mg, 4 mg, Oral, Q6H PRN **OR** ondansetron (ZOFRAN) injection 4 mg, 4 mg, Intravenous, Q6H PRN, Benjie Karvonen, Sital, MD, 4 mg at 03/06/18 0928 .  senna-docusate (Senokot-S) tablet 1 tablet, 1 tablet, Oral, QHS PRN, Bettey Costa, MD .  Derrill Memo ON 03/08/2018] warfarin (COUMADIN) tablet 2 mg, 2 mg, Oral, Once per day on Mon Wed Fri **AND** warfarin (COUMADIN) tablet 4 mg, 4 mg, Oral, Once per day on Sun Tue Thu Sat, Hallaji, Sheema M, [place] .  Warfarin - Pharmacist Dosing Inpatient, , Does not apply, q1800, Candelaria Stagers, [facility]   Physical exam:  Vitals:   03/07/18 0900 03/07/18 1000 03/07/18 1100 03/07/18 1200  BP:  116/80 127/71 108/63 102/67  Pulse: 93 93 93 86  Resp: 19 (!) 23 (!) 28 20  Temp:    98.4 F (36.9 C)  TempSrc:    Oral  SpO2: 96% 92% 93% 90%  Weight:      Height:       Physical Exam  Constitutional: He is oriented to person, place, and time and well-developed, well-nourished, and in no distress. No distress.  HENT:  Head: Normocephalic and atraumatic.  Nose: Nose normal.  Mouth/Throat: Oropharynx is clear and moist. No oropharyngeal exudate.  Eyes: Pupils are equal, round, and reactive to light. EOM are normal. Left eye exhibits no discharge. No scleral icterus.  Neck: Normal range of motion. Neck supple. No JVD present.  Cardiovascular: Normal rate, regular rhythm and normal heart sounds.  No murmur heard. Pulmonary/Chest: Effort normal and breath sounds normal. No respiratory distress. He has no wheezes.    Diminished breath sound bilaterally.  Mild respiratory distress.  Abdominal: Soft. He exhibits no distension and no mass. There is no tenderness. There is no rebound.  Musculoskeletal: Normal range of motion. He exhibits no edema or tenderness.  Lymphadenopathy:    He has no cervical adenopathy.  Neurological: He is  alert and oriented to person, place, and time. No cranial nerve deficit. He exhibits normal muscle tone. Coordination normal.  Skin: Skin is warm and dry. No rash noted. He is not diaphoretic. No erythema.  Psychiatric: Affect and judgment normal.       CMP Latest Ref Rng & Units 03/07/2018  Glucose 65 - 99 mg/dL 88  BUN 6 - 20 mg/dL 15  Creatinine 0.61 - 1.24 mg/dL 0.85  Sodium 135 - 145 mmol/L 135  Potassium 3.5 - 5.1 mmol/L 3.5  Chloride 101 - 111 mmol/L 109  CO2 22 - 32 mmol/L 21(L)  Calcium 8.9 - 10.3 mg/dL 7.4(L)  Total Protein 6.5 - 8.1 g/dL 5.5(L)  Total Bilirubin 0.3 - 1.2 mg/dL 1.0  Alkaline Phos 38 - 126 U/L 61  AST 15 - 41 U/L 20  ALT 17 - 63 U/L 15(L)   CBC Latest Ref Rng & Units 03/07/2018  WBC 3.8 - 10.6 K/uL 27.4(H)  Hemoglobin 13.0 - 18.0 g/dL 7.8(L)  Hematocrit 40.0 - 52.0 % 23.7(L)  Platelets 150 - 440 K/uL 116(L)   RADIOGRAPHIC STUDIES: I have personally reviewed the radiological images as listed and agreed with the findings in the report. Dg Chest 2 View  Result Date: 03/05/2018 CLINICAL DATA:  Tachypnea EXAM: CHEST - 2 VIEW COMPARISON:  12/26/2017 chest radiograph. FINDINGS: Stable cardiomediastinal silhouette with normal heart size. No pneumothorax. No pleural effusion. Emphysema. Hyperinflated lungs. No pulmonary edema. Patchy reticular and consolidative opacities at both lung bases, which appear chronic and slightly worsened at the right lung base. IMPRESSION: 1. Patchy reticular and consolidative opacities at both lung bases, chronic, slightly worsened at the right lung base, cannot exclude acute pneumonia or aspiration superimposed on chronic interstitial lung disease. 2. Hyperinflated lungs and emphysema, suggesting COPD. Electronically Signed   By: Ilona Sorrel M.D.   On: 03/05/2018 08:35   Dg Chest Port 1 View  Result Date: 03/06/2018 CLINICAL DATA:  Increased respiratory failure, history of CHF and COPD, prostate carcinoma, former smoking history EXAM:  PORTABLE CHEST 1 VIEW COMPARISON:  Chest x-ray of 03/02/2018 and 12/26/2017 FINDINGS: Although there are chronic markings at the lung bases when compared to prior images, there are more prominent interstitial markings not present diffusely. This pattern most likely represents  interstitial edema superimposed on chronic change. Small effusions may be present. Cardiomegaly is stable. IMPRESSION: 1. Increase in interstitial markings diffusely most consistent with interstitial edema. Possible small effusions. 2. Stable mild cardiomegaly and chronic interstitial change. Electronically Signed   By: Ivar Drape M.D.   On: 03/06/2018 09:28   [place] Ekg Site Rite  Result Date: 03/05/2018 If Site Rite image not attached, placement could not be confirmed due to current cardiac rhythm.   Assessment and plan-  Patient is a 84 y.o. male With a history of chronic leukocytosis presented with generalized weakness and nausea and vomiting.  Sepsis likely secondary to multilobar pneumonia,#associated with lactic acid elevation and hypotension.  Continue IV antibiotics plus azithromycin.  Management per ICU.  #Leukocytosis: Continue to improve.  Trending down to 27.4, close to patient's baseline.  Likely reactive secondary to sepsis. He can follow-up with Dr. Zachery Conch on the outpatient. #Macrocytic anemia: Normal folate and borderline B12 level.  Patient can follow-up with Dr. Gust Rung on the outpatient for additional work-up. Thank you for allowing me to participate in the care of this patient.   Earlie Server, MD, PhD Hematology Oncology [facility] Pager- [phone] 03/07/2018

## 2018-03-07 NOTE — Progress Notes (Signed)
Pharmacy Antibiotic Note  Thomas Townsend is a 84 y.o. male admitted on 03/05/2018 with pneumonia.  Pharmacy has been consulted for cefepime dosing. Azithromycin started 6/10.   Plan: Continue Cefepime 2g IV q12h   Height: 5\' 7"  (170.2 cm) Weight: 130 lb 15.3 oz (59.4 kg) IBW/kg (Calculated) : 66.1  Temp (24hrs), Avg:97.9 F (36.6 C), Min:97.4 F (36.3 C), Max:98.6 F (37 C)  Recent Labs  Lab 02/28/18 1011 03/05/18 0744 03/05/18 1124 03/05/18 1405 03/05/18 2127 03/06/18 0256 03/07/18 0553  WBC 14.9* 76.7*  --   --  53.7* 53.2* 27.4*  CREATININE  --  1.17  --  0.96 0.96 0.89 0.85  LATICACIDVEN  --  4.4* 3.1*  --  1.6  --   --     Estimated Creatinine Clearance: 54.4 mL/min (by C-G formula based on SCr of 0.85 mg/dL).    No Known Allergies  Antimicrobials this admission: 6/9 Vanco >> once 6/9 Cefepime >>  6/10 Azithromycin >>    Microbiology results: 6/9 BCx: pending 6/9 MRSA PCR: negative   6/9 Procalcitonin: 25>> 8.92   Thank you for allowing pharmacy to be a part of this patient's care.  Pernell Dupre, PharmD, BCPS Clinical Pharmacist 03/07/2018 7:48 AM

## 2018-03-07 NOTE — Progress Notes (Signed)
   03/07/18 1415  Clinical Encounter Type  Visited With Patient  Visit Type Follow-up  Spiritual Encounters  Spiritual Needs Prayer   Chaplain attempted visit; patient sleeping, did not respond to chaplain's voice.  Chaplain offered silent prayer for patient, family, and care team.

## 2018-03-07 NOTE — Progress Notes (Signed)
Report called to [facility], pt will transport on 4 liters OS, no belongings in room, his $20 is in an envelope in his chart, His daughter [name] is aware.

## 2018-03-07 NOTE — Care Management (Signed)
Patient requiring HFO2 55%; will need PT eval when stable. Per note, son feels patient will need rehab prior to returning home. Patient will need to be included in this decision. RNCM will follow.

## 2018-03-07 NOTE — Progress Notes (Signed)
Please note patient is currently followed by outpatient Palliative at home. CMRN Marshell Garfinkel made aware. Flo Shanks RN, BSN, [facility] [place], hospital Liaison [phone]

## 2018-03-08 ENCOUNTER — Inpatient Hospital Stay: Payer: Medicare Other

## 2018-03-08 LAB — CBC
HCT: 27.3 % — ABNORMAL LOW (ref 40.0–52.0)
Hemoglobin: 8.8 g/dL — ABNORMAL LOW (ref 13.0–18.0)
MCH: 32.2 pg (ref 26.0–34.0)
MCHC: 32.3 g/dL (ref 32.0–36.0)
MCV: 99.8 fL (ref 80.0–100.0)
PLATELETS: 137 10*3/uL — AB (ref 150–440)
RBC: 2.74 MIL/uL — AB (ref 4.40–5.90)
RDW: 19.4 % — ABNORMAL HIGH (ref 11.5–14.5)
WBC: 20.2 10*3/uL — AB (ref 3.8–10.6)

## 2018-03-08 LAB — BASIC METABOLIC PANEL
Anion gap: 6 (ref 5–15)
BUN: 16 mg/dL (ref 6–20)
CALCIUM: 7.9 mg/dL — AB (ref 8.9–10.3)
CO2: 23 mmol/L (ref 22–32)
CREATININE: 0.77 mg/dL (ref 0.61–1.24)
Chloride: 111 mmol/L (ref 101–111)
Glucose, Bld: 95 mg/dL (ref 65–99)
Potassium: 3.4 mmol/L — ABNORMAL LOW (ref 3.5–5.1)
SODIUM: 140 mmol/L (ref 135–145)

## 2018-03-08 LAB — MAGNESIUM: Magnesium: 2.2 mg/dL (ref 1.7–2.4)

## 2018-03-08 LAB — PROTIME-INR
INR: 2.23
PROTHROMBIN TIME: 24.5 s — AB (ref 11.4–15.2)

## 2018-03-08 LAB — PROCALCITONIN: PROCALCITONIN: 5.1 ng/mL

## 2018-03-08 MED ORDER — POTASSIUM CHLORIDE CRYS ER 20 MEQ PO TBCR
40.0000 meq | EXTENDED_RELEASE_TABLET | Freq: Once | ORAL | Status: AC
  Administered 2018-03-08: 40 meq via ORAL
  Filled 2018-03-08: qty 2

## 2018-03-08 MED ORDER — SENNOSIDES-DOCUSATE SODIUM 8.6-50 MG PO TABS
1.0000 | ORAL_TABLET | Freq: Every day | ORAL | Status: DC
  Administered 2018-03-08 – 2018-03-09 (×2): 1 via ORAL
  Filled 2018-03-08 (×2): qty 1

## 2018-03-08 MED ORDER — POLYETHYLENE GLYCOL 3350 17 G PO PACK
17.0000 g | PACK | Freq: Every day | ORAL | Status: DC
  Administered 2018-03-08 – 2018-03-09 (×2): 17 g via ORAL
  Filled 2018-03-08 (×2): qty 1

## 2018-03-08 NOTE — Clinical Social Work Note (Signed)
Clinical Social Work Assessment  Patient Details  Name: Thomas Townsend MRN: [id number] Date of Birth: 23-Aug-1931  Date of referral:  03/08/18               Reason for consult:  Facility Placement                Permission sought to share information with:  Facility Sport and exercise psychologist, Family Supports Permission granted to share information::  Yes, Verbal Permission Granted  Name::     [place] Careers information officer::  SNF admissions  Relationship::     Contact Information:     Housing/Transportation Living arrangements for the past 2 months:  Single Family Home Source of Information:  Patient, Power of [place], Adult Children Patient Interpreter Needed:  None Criminal Activity/Legal Involvement Pertinent to Current Situation/Hospitalization:  No - Comment as needed Significant Relationships:  Adult Children Lives with:  Siblings Do you feel safe going back to the place where you live?  No Need for family participation in patient care:  Yes (Comment)  Care giving concerns:  Patient and family feel he needs some short term rehab before he is able to return back home.   Social Worker assessment / plan:  Patient is an 84 year old male who is alert and oriented x3.  Patient lives at home with his brother and her wife.  Patient had some confusion, however patient's daughter was at bedside, CSW spoke to daughter and patient to complete assessment.  CSW explained role and process of trying to find placement at SNF for short term rehab.  CSW was informed that patient has not been to SNF for rehab, CSW explained to patient and his daughter what to expect at SNF and how insurance will pay for stay.  Patient's daughter stated that he has had home health before, and also has had palliative follow as an outpatient.  Patient's daughter did not express any other questions and gave CSW permission to begin bed search in [place].  Employment status:   Retired Nurse, adult PT Recommendations:  [place] / Referral to community resources:  [place]  Patient/Family's Response to care:  Patient and family are in agreement to going to SNF for short term rehab with palliative to follow per recommendations by palliative team.  Patient/Family's Understanding of and Emotional Response to Diagnosis, Current Treatment, and Prognosis:  Patient and daughter are hopeful that he will not have to be in SNF for very long.  Patient is motivated to work with therapy.  Emotional Assessment Appearance:  Appears stated age Attitude/Demeanor/Rapport:    Affect (typically observed):  Appropriate, Calm Orientation:  Oriented to Self, Oriented to Place Alcohol / Substance use:  Not Applicable Psych involvement (Current and /or in the community):  No (Comment)  Discharge Needs  Concerns to be addressed:  Care Coordination, Lack of Support Readmission within the last 30 days:  No Current discharge risk:  Lack of support system Barriers to Discharge:  Ship broker, Continued Medical Work up   Anell Barr 03/08/2018, 7:29 PM

## 2018-03-08 NOTE — Progress Notes (Signed)
ANTICOAGULATION CONSULT NOTE   Pharmacy Consult for warfarin Indication: atrial fibrillation  No Known Allergies  Patient Measurements: Height: 5\' 7"  (170.2 cm) Weight: 130 lb 15.3 oz (59.4 kg) IBW/kg (Calculated) : 66.1  Vital Signs: Temp: 97.5 F (36.4 C) (06/12 0747) Temp Source: Oral (06/12 0747) BP: 107/68 (06/12 0747) Pulse Rate: 43 (06/12 0747)  Labs: Recent Labs    03/05/18 2127 03/06/18 0256 03/06/18 0902 03/07/18 0553 03/08/18 0516  HGB 8.0* 8.0*  --  7.8* 8.8*  HCT 24.6* 24.4*  --  23.7* 27.3*  PLT 135* 135*  --  116* 137*  LABPROT 22.7* 23.0*  --  24.0* 24.5*  INR 2.02 2.06  --  2.17 2.23  CREATININE 0.96 0.89  --  0.85 0.77  TROPONINI 0.03* 0.03* 0.03*  --   --     Estimated Creatinine Clearance: 57.8 mL/min (by C-G formula based on SCr of 0.77 mg/dL).   Medical History: Past Medical History:  Diagnosis Date  . Arthritis   . Atrial fibrillation ([place])   . CHF (congestive heart failure) ([place])   . COPD (chronic obstructive pulmonary disease) ([place])   . Dysrhythmia   . GERD (gastroesophageal reflux disease)   . Hyperlipemia   . Hypertension   . Leukocytosis 01/14/2016  . Prostate cancer ([place])   . Stroke [facility])     Assessment: 84 year old male presents to ED due to vomiting and diarrhea which started yesterday. Per patient's daughter, patient has history of leukemia and has refused chemo/radiation. Patient on warfarin PTA for atrial fibrillation. Daughter states patient last took warfarin yesterday. Home warfarin regimen 2mg  PO every M, W F and 4mg  PO every Sun, Tues, Thurs, Sat.    Dosing History:  Date INR Dose 6/9 1.61 4mg  6/10 2.06 2mg  6/11 2.17 4 mg 6/12 2.23  Goal of Therapy:  INR 2-3  Plan:  INR remains therapeutic this morning. Patient receiving Abx. Will continue with patient's home regimen at this time and order warfarin 2 mg this evening.   Patient is concurrently on cefepime and azithromycin which can increase the anticoagulant  effect of warfarin. Will monitor daily INR's while patient on antibiotics.  Continue to check CBC's at least every 3 days.   Noralee Space, PharmD, BCPS Clinical Pharmacist 03/08/2018 8:27 AM

## 2018-03-08 NOTE — Plan of Care (Signed)
  Problem: Safety: Goal: Ability to remain free from injury will improve Outcome: Progressing   

## 2018-03-08 NOTE — NC FL2 (Addendum)
[place] LEVEL OF CARE SCREENING TOOL     IDENTIFICATION  Patient Name: Thomas Townsend Birthdate: 1931/02/23 Sex: male Admission Date (Current Location): 03/05/2018  [place] and [place] Number:  Engineering geologist and Address:  [facility], [address] [place], [place] [address]      Provider Number: [id number]  Attending Physician Name and Address:  Bettey Costa, MD  Relative Name and Phone Number:     Cristina Gong Daughter [phone]       Current Level of Care: Hospital Recommended Level of Care: [place] Prior Approval Number:    Date Approved/Denied:   PASRR Number: [id number]  Discharge Plan: SNF    Current Diagnoses: Patient Active Problem List   Diagnosis Date Noted  . Goals of care, counseling/discussion   . Palliative care by specialist   . Elevated lactic acid level   . Pneumonia due to infectious organism   . Long term (current) use of anticoagulants 01/11/2018  . Hypertension 07/14/2017  . Altered mental status   . Palliative care encounter   . CLL (chronic lymphocytic leukemia) ([place]) 04/12/2017  . Acute metabolic encephalopathy 02/54/2706  . Aspiration pneumonia ([place]) 03/22/2017  . Right upper lobe pneumonia ([place]) 10/05/2016  . Chronic neutrophilia 08/26/2016  . Iron deficiency anemia due to chronic blood loss 08/26/2016  . Myeloproliferative neoplasm ([place]) 06/25/2016  . Anemia due to other cause 06/25/2016  . Diarrhea 02/26/2016  . Leukocytosis 01/14/2016  . Acidosis, lactic 08/11/2015  . Acute respiratory failure with hypoxia ([place]) 08/11/2015  . Pulmonary hypertension ([place]) 08/11/2015  . Mild aortic stenosis 08/11/2015  . Generalized weakness 08/11/2015  . Anemia of chronic disease 08/11/2015  . Sepsis ([place]) 08/06/2015  . Atrial fibrillation with rapid ventricular response ([place]) 08/06/2015  . CAP (community acquired pneumonia) 08/06/2015  . Rheumatoid  arthritis involving multiple joints ([place]) 01/28/2015  . CAFL (chronic airflow limitation) ([place]) 03/04/2014  . Acid reflux 03/04/2014  . Chronic diastolic CHF (congestive heart failure) ([place]) 03/04/2014  . Atrial fibrillation ([place]) 03/04/2014  . COPD (chronic obstructive pulmonary disease) ([place]) 03/04/2014    Orientation RESPIRATION BLADDER Height & Weight     Self  O2(5L) Continent Weight: 130 lb 15.3 oz (59.4 kg) Height:  5\' 7"  (170.2 cm)  BEHAVIORAL SYMPTOMS/MOOD NEUROLOGICAL BOWEL NUTRITION STATUS      Continent Diet(Regular)  AMBULATORY STATUS COMMUNICATION OF NEEDS Skin   Limited Assist Verbally Normal                       Personal Care Assistance Level of Assistance  Bathing, Feeding, Dressing Bathing Assistance: Limited assistance Feeding assistance: Independent Dressing Assistance: Limited assistance     Functional Limitations Info  Sight, Hearing, Speech Sight Info: Adequate Hearing Info: Adequate Speech Info: Adequate    SPECIAL CARE FACTORS FREQUENCY  PT (By licensed PT), OT (By licensed OT)     PT Frequency: 5x a week OT Frequency: 5x a week            Contractures Contractures Info: Not present    Additional Factors Info  Psychotropic Code Status Info: DNR Allergies Info: NKA Psychotropic Info: citalopram (CELEXA) tablet 20 mg and haloperidol lactate (HALDOL) injection 2 mg          Current Medications (03/08/2018):  This is the current hospital active medication list Current Facility-Administered Medications  Medication Dose Route Frequency Provider Last Rate Last Dose  . acetaminophen (TYLENOL) tablet 650 mg  650 mg Oral Q6H PRN Bettey Costa, MD   650 mg at 03/07/18 1958  . azithromycin (ZITHROMAX) 500 mg in sodium chloride 0.9 % 250 mL IVPB  500 mg Intravenous Q24H Wilhelmina Mcardle, MD   Stopped at 03/08/18 1215  . ceFEPIme (MAXIPIME) 2 g in sodium chloride 0.9 % 100 mL IVPB  2 g Intravenous Q12H Hallaji, Dani Gobble, RPH   Stopped at  03/08/18 1330  . citalopram (CELEXA) tablet 20 mg  20 mg Oral Daily Bettey Costa, MD   20 mg at 03/08/18 0810  . digoxin (LANOXIN) tablet 0.125 mg  0.125 mg Oral Mechele Dawley, Sital, MD   0.125 mg at 03/08/18 0810  . ferrous sulfate tablet 325 mg  325 mg Oral q morning - 10a Bettey Costa, MD   325 mg at 03/08/18 0810  . fluticasone furoate-vilanterol (BREO ELLIPTA) 100-25 MCG/INH 1 puff  1 puff Inhalation Oretha Ellis, MD   1 puff at 03/08/18 0810  . folic acid (FOLVITE) tablet 1 mg  1 mg Oral Daily Mody, Sital, MD   1 mg at 03/08/18 0810  . haloperidol lactate (HALDOL) injection 2 mg  2 mg Intravenous Once Harrie Foreman, MD      . HYDROcodone-acetaminophen (NORCO/VICODIN) 5-325 MG per tablet 1-2 tablet  1-2 tablet Oral Q4H PRN Mody, Sital, MD      . ipratropium-albuterol (DUONEB) 0.5-2.5 (3) MG/3ML nebulizer solution 3 mL  3 mL Nebulization Q4H PRN Wilhelmina Mcardle, MD   3 mL at 03/07/18 1948  . methotrexate (RHEUMATREX) tablet 2.5 mg  2.5 mg Oral Weekly Mody, Sital, MD   2.5 mg at 03/05/18 1350  . ondansetron (ZOFRAN) injection 4 mg  4 mg Intravenous Q6H PRN Bettey Costa, MD   4 mg at 03/06/18 0928  . polyethylene glycol (MIRALAX / GLYCOLAX) packet 17 g  17 g Oral Daily Bettey Costa, MD   17 g at 03/08/18 1653  . senna-docusate (Senokot-S) tablet 1 tablet  1 tablet Oral QHS PRN Bettey Costa, MD      . senna-docusate (Senokot-S) tablet 1 tablet  1 tablet Oral Q1500 Bettey Costa, MD   1 tablet at 03/08/18 1653  . warfarin (COUMADIN) tablet 2 mg  2 mg Oral Once per day on Mon Wed Fri Nancy Fetter M, [place]   2 mg at 03/08/18 1653   And  . warfarin (COUMADIN) tablet 4 mg  4 mg Oral Once per day on Sun Tue Thu Sat Nancy Fetter M, RPH   4 mg at 03/07/18 1717  . Warfarin - Pharmacist Dosing Inpatient   Does not apply [id number] Candelaria Stagers, [facility]         Discharge Medications: Please see discharge summary for a list of discharge medications.  Relevant Imaging Results:  Relevant Lab  Results:   Additional Information SSN [id number]  Ross Ludwig, [place]

## 2018-03-08 NOTE — Progress Notes (Signed)
[place] [facility] [place] NAME: Thomas Townsend    MR#:  [id number]  DATE OF BIRTH:  10-12-1930  SUBJECTIVE:  Patient on nasal cannula this morning.  He reports he is doing well.    REVIEW OF SYSTEMS:    Review of Systems  Constitutional: Negative for fever, chills weight loss + Generalized weakness  HENT: Negative for ear pain, nosebleeds, congestion, facial swelling, rhinorrhea, neck pain, neck stiffness and ear discharge.   Respiratory: ++ for cough, shortness of breath, no wheezing  Cardiovascular: Negative for chest pain, palpitations and leg swelling.  Gastrointestinal: Negative for heartburn, abdominal pain, vomiting, diarrhea or consitpation Genitourinary: Negative for dysuria, urgency, frequency, hematuria Musculoskeletal: Negative for back pain or joint pain Neurological: Negative for dizziness, seizures, syncope, focal weakness,  numbness and headaches.  Hematological: Does not bruise/bleed easily.  Psychiatric/Behavioral: Negative for hallucinations, confusion, dysphoric mood    Tolerating Diet: yes      DRUG ALLERGIES:  No Known Allergies  VITALS:  Blood pressure 107/68, pulse (!) 43, temperature (!) 97.5 F (36.4 C), temperature source Oral, resp. rate 20, height 5\' 7"  (1.702 m), weight 59.4 kg (130 lb 15.3 oz), SpO2 93 %.  PHYSICAL EXAMINATION:  Constitutional: Appears well-developed and well-nourished. No distress. HENT: Normocephalic. .  Eyes: Conjunctivae and EOM are normal. PERRLA, no scleral icterus.  Neck: Normal ROM. Neck supple. No JVD. No tracheal deviation. CVS: Irregular, irregular, S1/S2 +, no murmurs, no gallops, no carotid bruit.  Pulmonary: Normal respiratory effort with bilateral crackles at bases  abdominal: Soft. BS +,  no distension, tenderness, rebound or guarding.  Musculoskeletal: Normal range of motion. No edema and no tenderness.  Neuro: Alert.  No focal deficits. Skin: Skin is warm and dry.  No rash noted. Psychiatric: Normal mood and affect.      LABORATORY PANEL:   CBC Recent Labs  Lab 03/08/18 0516  WBC 20.2*  HGB 8.8*  HCT 27.3*  PLT 137*   ------------------------------------------------------------------------------------------------------------------  Chemistries  Recent Labs  Lab 03/05/18 2127  03/07/18 0553 03/08/18 0516  NA 136   < > 135 140  K 3.6   < > 3.5 3.4*  CL 106   < > 109 111  CO2 23   < > 21* 23  GLUCOSE 111*   < > 88 95  BUN 21*   < > 15 16  CREATININE 0.96   < > 0.85 0.77  CALCIUM 7.5*   < > 7.4* 7.9*  MG 2.2  --   --   --   AST  --   --  20  --   ALT  --   --  15*  --   ALKPHOS  --   --  61  --   BILITOT  --   --  1.0  --    < > = values in this interval not displayed.   ------------------------------------------------------------------------------------------------------------------  Cardiac Enzymes Recent Labs  Lab 03/05/18 2127 03/06/18 0256 03/06/18 0902  TROPONINI 0.03* 0.03* 0.03*   ------------------------------------------------------------------------------------------------------------------  RADIOLOGY:  Dg Chest Port 1 View  Result Date: 03/08/2018 CLINICAL DATA:  Respiratory failure EXAM: PORTABLE CHEST 1 VIEW COMPARISON:  March 06, 2018 and December 26, 2017 FINDINGS: There is patchy airspace opacity in the right base, suspicious for a focal area of pneumonia. There is underlying diffuse interstitial prominence consistent with chronic fibrotic type change. There is also felt to be a degree of underlying interstitial pulmonary edema. There are small  pleural effusions bilaterally. There is cardiomegaly with pulmonary venous hypertension. There is aortic atherosclerosis. No adenopathy evident. No evident bone lesions. IMPRESSION: Underlying chronic interstitial disease with probable degree of superimposed congestive heart failure. New airspace opacity right base concerning for developing pneumonia in this area  superimposed on other changes. Cardiac silhouette is stable. There remains aortic atherosclerosis. Aortic Atherosclerosis (ICD10-I70.0). Electronically Signed   By: Lowella Grip III M.D.   On: 03/08/2018 07:23     ASSESSMENT AND PLAN:    84 year old male with history of essential hypertension hyperlipidemia, PAF  who presented to the emergency room due to nausea vomiting.  1.  Acute hypoxic respiratory failure in the setting of pneumonia Continue cefepime and azithromycin today. He has been weaned from BiPAP to high flow nasal cannula to regular nasal cannula.     2.  Sepsis due to multilobar pneumonia: Continue cefepime and azithromycin. Lactic acid improving Procalcitonin level is improving  3.  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  He will follow-up with oncology after discharge. White blood cell count has improved which suggests this is reactive due to pneumonia  4. Macrocytic Anemia: Hemoglobin is stable.  Patient will follow-up with Dr. Gust Rung on outpatient basis for additional work-up.    5.  PAF: Pharmacy dosing Coumadin would monitor hemoglobin closely continue digoxin   6.  Hypotension: This is improved.  He is off of pressors.  7.  Depression: Continue Celexa  8.  Electrolyte abnormalities: Replete PRN  9.  Arthritis: Continue methotrexate  Patient will continue outpatient palliative care upon discharge  Physical therapy is recommending skilled nursing facility upon discharge. Discussed with clinical Education officer, museum.   Management plans discussed with the patient and he is in agreement. CODE STATUS: DNR  TOTAL TIME TAKING CARE OF THIS PATIENT: 25 minutes.     POSSIBLE D/C 1-2 days, DEPENDING ON CLINICAL CONDITION.   Jazlyn Tippens M.D on 03/08/2018 at 11:00 AM  Between 7am to 6pm - Pager - [phone] After 6pm go to [email or web address] - password [email or web address] [place]  Office  [phone]  CC: Primary  care physician; Elby Beck, FNP  Note: This dictation was prepared with Dragon dictation along with smaller phrase technology. Any transcriptional errors that result from this process are unintentional.

## 2018-03-08 NOTE — Progress Notes (Signed)
Daily Progress Note   Patient Name: Thomas Townsend       Date: 03/08/2018 DOB: 1931-04-05  Age: 84 y.o. MRN#: [id number] Attending Physician: Bettey Costa, MD Primary Care Physician: Elby Beck, FNP Admit Date: 03/05/2018  Reason for Consultation/Follow-up: Establishing goals of care  Subjective: Sleeping on my arrival, wakes easily, tells me he feels better than yesterday and ate 2 boiled eggs for breakfast. On 4L [place]  Length of Stay: 3  Current Medications: Scheduled Meds:  . citalopram  20 mg Oral Daily  . digoxin  0.125 mg Oral BH-q7a  . ferrous sulfate  325 mg Oral q morning - 10a  . fluticasone furoate-vilanterol  1 puff Inhalation BH-q7a  . folic acid  1 mg Oral Daily  . haloperidol lactate  2 mg Intravenous Once  . methotrexate  2.5 mg Oral Weekly  . warfarin  2 mg Oral Once per day on Mon Wed Fri   And  . warfarin  4 mg Oral Once per day on Sun Tue Thu Sat  . Warfarin - Pharmacist Dosing Inpatient   Does not apply q1800    Continuous Infusions: . azithromycin Stopped (03/07/18 1235)  . ceFEPime (MAXIPIME) IV Stopped (03/07/18 2327)    PRN Meds: acetaminophen **OR** [DISCONTINUED] acetaminophen, HYDROcodone-acetaminophen, ipratropium-albuterol, [DISCONTINUED] ondansetron **OR** ondansetron (ZOFRAN) IV, senna-docusate  Physical Exam  Constitutional: He is oriented to person, place, and time. He is sleeping. He is easily aroused. He appears ill. No distress.  HENT:  Head: Normocephalic and atraumatic.  Hard of hearing  Cardiovascular: An irregular rhythm present.  Pulmonary/Chest: Effort normal. He has decreased breath sounds. He has wheezes.  Abdominal: Soft.  Musculoskeletal:       Right lower leg: He exhibits no edema.       Left lower leg: He exhibits no edema.    Neurological: He is oriented to person, place, and time and easily aroused.  Periods of confusion  Skin: There is pallor.            Vital Signs: BP 107/68 (BP Location: Left Arm)   Pulse (!) 43   Temp (!) 97.5 F (36.4 C) (Oral)   Resp 20   Ht '5\' 7"'  (1.702 m)   Wt 59.4 kg (130 lb 15.3 oz)   SpO2 93%   BMI 20.51 kg/m  SpO2: SpO2: 93 % O2 Device: O2 Device: Nasal Cannula O2 Flow Rate: O2 Flow Rate (L/min): 5 L/min  Intake/output summary:   Intake/Output Summary (Last 24 hours) at 03/08/2018 1037 Last data filed at 03/08/2018 1015 Gross per 24 hour  Intake 570 ml  Output 625 ml  Net -55 ml   LBM: Last BM Date: 03/05/18 Baseline Weight: Weight: 56.7 kg (124 lb 14.4 oz) Most recent weight: Weight: 59.4 kg (130 lb 15.3 oz)       Palliative Assessment/Data: PPS 40%    Flowsheet Rows     Most Recent Value  Intake Tab  Referral Department  Hospitalist  Unit at Time of Referral  Intermediate Care Unit  Palliative Care Primary Diagnosis  Sepsis/Infectious Disease  Date Notified  03/05/18  Palliative Care Type  Return patient Palliative Care  Reason for referral  Clarify Goals  of Care  Date of Admission  03/05/18  Date first seen by Palliative Care  03/07/18  # of days Palliative referral response time  2 Day(s)  # of days IP prior to Palliative referral  0  Clinical Assessment  Palliative Performance Scale Score  40%  Psychosocial & Spiritual Assessment  Palliative Care Outcomes  Patient/Family meeting held?  Yes  Who was at the meeting?  daughter, [name]  Palliative Care Outcomes  Counseled regarding hospice, Provided psychosocial or spiritual support, Clarified goals of care      Patient Active Problem List   Diagnosis Date Noted  . Goals of care, counseling/discussion   . Palliative care by specialist   . Elevated lactic acid level   . Pneumonia due to infectious organism   . Long term (current) use of anticoagulants 01/11/2018  . Hypertension 07/14/2017  .  Altered mental status   . Palliative care encounter   . CLL (chronic lymphocytic leukemia) ([place]) 04/12/2017  . Acute metabolic encephalopathy 64/33/2951  . Aspiration pneumonia ([place]) 03/22/2017  . Right upper lobe pneumonia ([place]) 10/05/2016  . Chronic neutrophilia 08/26/2016  . Iron deficiency anemia due to chronic blood loss 08/26/2016  . Myeloproliferative neoplasm ([place]) 06/25/2016  . Anemia due to other cause 06/25/2016  . Diarrhea 02/26/2016  . Leukocytosis 01/14/2016  . Acidosis, lactic 08/11/2015  . Acute respiratory failure with hypoxia ([place]) 08/11/2015  . Pulmonary hypertension ([place]) 08/11/2015  . Mild aortic stenosis 08/11/2015  . Generalized weakness 08/11/2015  . Anemia of chronic disease 08/11/2015  . Sepsis ([place]) 08/06/2015  . Atrial fibrillation with rapid ventricular response ([place]) 08/06/2015  . CAP (community acquired pneumonia) 08/06/2015  . Rheumatoid arthritis involving multiple joints ([place]) 01/28/2015  . CAFL (chronic airflow limitation) ([place]) 03/04/2014  . Acid reflux 03/04/2014  . Chronic diastolic CHF (congestive heart failure) ([place]) 03/04/2014  . Atrial fibrillation ([place]) 03/04/2014  . COPD (chronic obstructive pulmonary disease) ([place]) 03/04/2014    Palliative Care Assessment & Plan   HPI:  84 y.o. male  with past medical history of CHF, afib, COPD, prostate CA, RA, HLD, HTN, stroke and ?leukemia admitted on 03/05/2018 with nausea, vomiting, and diarrhea.He was diagnosed with pna and sepsis - possibly aspiration? He was recently admitted to the hospital in April for the same - HCAP and sepsis. Patient has been on bipap and HFNC. Also required pressor support. Now off of pressors and on 4L [place].   Per oncology, pt has been watched for many years with a suspicion of underlying myeloproliferative neoplasm, such as CMML; however, this has never been confirmed. A bone marrow biopsy is recommended to confirm to CMML, however even if CMML diagnosis is confirmed, there  is no cure at patient's age and with his multiple comorbidities. Family agrees to no biopsy.  Patient has been seen by PMT before and is followed by outpatient palliative care. PMT consulted for [place].   Assessment: Patient out of ICU, off pressors, on nasal cannula. Noted that he did well with SLP eval. Tells me he feels better. Family relieved with improvement. They tell me he is eating better but still having periods of confusion.  Discussed with daughter plan of care moving forward. She is thinking about rehab - discussed palliative continue to follow patient at rehab. We discussed that patient is likely eligible for his hospice benefit and going home with hospice is also an option. She is going to speak to her brothers about these options.  We also discussed MOST form -  talked about if patient should be rehospitalized if he were to get sick again after discharge. She feels that he should not be hospitalized again and if he declined after discharge we should focus on his comfort with the support of hospice. She wants to discuss this with her brothers as well before completing MOST form.   Recommendations/Plan:  PMT will follow up with daughter this afternoon or tomorrow about discharge options (home w/hospice vs rehab w/palliative) and MOST form  Code Status:  DNR  Prognosis:   Unable to determine  Discharge Planning:  To Be Determined - home w/hospice vs rehab w/palliative  Care plan was discussed with patient's daughter, [name]  Thank you for allowing the Palliative Medicine Team to assist in the care of this patient.   Total Time 40 minutes Prolonged Time Billed  no       Greater than 50%  of this time was spent counseling and coordinating care related to the above assessment and plan.  Juel Burrow, DNP, [facility] Palliative Medicine Team Team Phone # [phone]  Pager [phone]

## 2018-03-08 NOTE — Care Management Important Message (Signed)
Copy of signed IM left with patient in room.  

## 2018-03-08 NOTE — Care Management (Signed)
Patient is followed by outpatient palliative through [facility] He is currently being treated for pneumonia.  He has been weaned nasal cannula at  4 liters oxygen which is acute. Patient does have underlying chronic dx of heart failure and copd.   Physical therapy has recommended SNF.  Updated CSW. History chronic leukocytosis and condition has been followed for many years. CMML has never been confirmed.  Patient/son choose not to pursue bone marrow biopsy or further treatment/ workup  for possible CMML.   Updated CSW of physical therapy recommendations.  He will need home 02 assessment if discharges home rather than to a facility.

## 2018-03-09 DIAGNOSIS — J9601 Acute respiratory failure with hypoxia: Secondary | ICD-10-CM | POA: Diagnosis not present

## 2018-03-09 DIAGNOSIS — A419 Sepsis, unspecified organism: Secondary | ICD-10-CM | POA: Diagnosis not present

## 2018-03-09 DIAGNOSIS — M6281 Muscle weakness (generalized): Secondary | ICD-10-CM | POA: Diagnosis not present

## 2018-03-09 DIAGNOSIS — Z7189 Other specified counseling: Secondary | ICD-10-CM | POA: Diagnosis not present

## 2018-03-09 DIAGNOSIS — I10 Essential (primary) hypertension: Secondary | ICD-10-CM | POA: Diagnosis not present

## 2018-03-09 DIAGNOSIS — R2689 Other abnormalities of gait and mobility: Secondary | ICD-10-CM | POA: Diagnosis not present

## 2018-03-09 DIAGNOSIS — Z7401 Bed confinement status: Secondary | ICD-10-CM | POA: Diagnosis not present

## 2018-03-09 DIAGNOSIS — I509 Heart failure, unspecified: Secondary | ICD-10-CM | POA: Diagnosis not present

## 2018-03-09 DIAGNOSIS — I4892 Unspecified atrial flutter: Secondary | ICD-10-CM | POA: Diagnosis not present

## 2018-03-09 DIAGNOSIS — J189 Pneumonia, unspecified organism: Secondary | ICD-10-CM | POA: Diagnosis not present

## 2018-03-09 DIAGNOSIS — Z515 Encounter for palliative care: Secondary | ICD-10-CM | POA: Diagnosis not present

## 2018-03-09 DIAGNOSIS — E785 Hyperlipidemia, unspecified: Secondary | ICD-10-CM | POA: Diagnosis not present

## 2018-03-09 DIAGNOSIS — I959 Hypotension, unspecified: Secondary | ICD-10-CM | POA: Diagnosis not present

## 2018-03-09 LAB — BASIC METABOLIC PANEL
ANION GAP: 6 (ref 5–15)
BUN: 13 mg/dL (ref 6–20)
CALCIUM: 8 mg/dL — AB (ref 8.9–10.3)
CHLORIDE: 108 mmol/L (ref 101–111)
CO2: 23 mmol/L (ref 22–32)
Creatinine, Ser: 0.66 mg/dL (ref 0.61–1.24)
GFR calc Af Amer: >60 mL/min (ref >60)
GFR calc non Af Amer: >60 mL/min (ref >60)
GLUCOSE: 93 mg/dL (ref 65–99)
Potassium: 3.8 mmol/L (ref 3.5–5.1)
Sodium: 137 mmol/L (ref 135–145)

## 2018-03-09 LAB — CBC
HEMATOCRIT: 26.8 % — AB (ref 40.0–52.0)
HEMOGLOBIN: 8.9 g/dL — AB (ref 13.0–18.0)
MCH: 33 pg (ref 26.0–34.0)
MCHC: 33.2 g/dL (ref 32.0–36.0)
MCV: 99.4 fL (ref 80.0–100.0)
Platelets: 156 10*3/uL (ref 150–440)
RBC: 2.69 MIL/uL — ABNORMAL LOW (ref 4.40–5.90)
RDW: 18.5 % — AB (ref 11.5–14.5)
WBC: 18.1 10*3/uL — AB (ref 3.8–10.6)

## 2018-03-09 LAB — PROTIME-INR
INR: 2.18
Prothrombin Time: 24.1 seconds — ABNORMAL HIGH (ref 11.4–15.2)

## 2018-03-09 MED ORDER — CEFUROXIME AXETIL 500 MG PO TABS: 500.0000 mg | ORAL_TABLET | Freq: Two times a day (BID) | ORAL | 0 refills | Status: AC

## 2018-03-09 MED ORDER — FUROSEMIDE 20 MG PO TABS: 20.0000 mg | ORAL_TABLET | ORAL | Status: DC

## 2018-03-09 MED ORDER — AZITHROMYCIN 500 MG PO TABS: 500.0000 mg | ORAL_TABLET | Freq: Every day | ORAL | 0 refills | Status: AC

## 2018-03-09 NOTE — Plan of Care (Signed)
  Problem: Health Behavior/Discharge Planning: Goal: Ability to manage health-related needs will improve Outcome: Progressing   Problem: Clinical Measurements: Goal: Ability to maintain clinical measurements within normal limits will improve Outcome: Progressing   Problem: Safety: Goal: Ability to remain free from injury will improve Outcome: Progressing   

## 2018-03-09 NOTE — Progress Notes (Signed)
ANTICOAGULATION CONSULT NOTE   Pharmacy Consult for warfarin Indication: atrial fibrillation  No Known Allergies  Patient Measurements: Height: 5\' 7"  (170.2 cm) Weight: 137 lb 1.6 oz (62.2 kg) IBW/kg (Calculated) : 66.1  Vital Signs: Temp: 97.5 F (36.4 C) (06/13 0816) Temp Source: Oral (06/13 0816) BP: 124/84 (06/13 0816) Pulse Rate: 92 (06/13 0816)  Labs: Recent Labs    03/07/18 0553 03/08/18 0516 03/09/18 0623  HGB 7.8* 8.8* 8.9*  HCT 23.7* 27.3* 26.8*  PLT 116* 137* 156  LABPROT 24.0* 24.5* 24.1*  INR 2.17 2.23 2.18  CREATININE 0.85 0.77 0.66    Estimated Creatinine Clearance: 60.5 mL/min (by C-G formula based on SCr of 0.66 mg/dL).   Medical History: Past Medical History:  Diagnosis Date  . Arthritis   . Atrial fibrillation ([place])   . CHF (congestive heart failure) ([place])   . COPD (chronic obstructive pulmonary disease) ([place])   . Dysrhythmia   . GERD (gastroesophageal reflux disease)   . Hyperlipemia   . Hypertension   . Leukocytosis 01/14/2016  . Prostate cancer ([place])   . Stroke [facility])     Assessment: 84 year old male presents to ED due to vomiting and diarrhea which started yesterday. Per patient's daughter, patient has history of leukemia and has refused chemo/radiation. Patient on warfarin PTA for atrial fibrillation. Daughter states patient last took warfarin yesterday. Home warfarin regimen 2mg  PO every M, W F and 4mg  PO every Sun, Tues, Thurs, Sat.    Dosing History:  Date INR Dose 6/9 1.61 4mg  6/10 2.06 2mg  6/11 2.17 4 mg 6/12 2.23  Goal of Therapy:  INR 2-3  Plan:  Continue warfarin 2mg  daily. Will monitor daily INRs.   Continue to check CBC's at least every 3 days.   Pharmacy will continue to monitor and adjust per consult.   Mally Gavina L 03/09/2018 3:38 PM

## 2018-03-09 NOTE — Clinical Social Work Note (Addendum)
CSW spoke to patient's daughter [name] [phone] and presented bed offers.  Patient's daughter chose [facility] [place] contacted [facility] they can accept patient once insurance Josem Kaufmann has been received.  [facility] has [facility].  1:50pm  CSW spoke to [facility], they have received insurance authorization for patient to go to SNF.  Patient to be d/c'ed today to [facility] room 1205.  Patient and family agreeable to plans will transport via ems RN to call report [phone].  CSW updated patient's daughter [name] and she is aware that patient will be discharging today.  Jones Broom. [place], MSW, [place]  03/09/2018 11:22 AM

## 2018-03-09 NOTE — Progress Notes (Signed)
Physical Therapy Treatment Patient Details Name: Thomas Townsend MRN: [id number] DOB: Jul 22, 1931 Today's Date: 03/09/2018    History of Present Illness Pt is an 84 y.o. male with a known history of chronic atrial fibrillation on anticoagulation and COPD not on oxygen who presented to the emergency room via EMS due to nausea, vomiting, and diarrhea.   Family reported that patient was in his usual state of health but when patient's son went to check on him he was covered in feces.  He was brought to the ER for further evaluation.  In the emergency room chest x-ray was concerning for pneumonia.  He was also found to be hypoxic.  He was started on broad-spectrum antibiotics.  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      PT Comments    Pt ready for gait.  Pt on 2 lpm.  Sats observed throughout on 2 lpm and fluctuated from 86-95% during session.  Rest periods given to maintain sats.  He was able to ambulate 40' x 2 with walker and min guard/assist x 1.  Participated in exercises as described below.  Pt stated he did not use RW at home prior to admit, he would benefit from a rollator (which he has at home from wife) for energy conservation and balance at this time.   Follow Up Recommendations  SNF;Supervision for mobility/OOB     Equipment Recommendations       Recommendations for Other Services       Precautions / Restrictions Precautions Precautions: Fall Precaution Comments: Watch O2 Restrictions Weight Bearing Restrictions: No    Mobility  Bed Mobility Overal bed mobility: Modified Independent                Transfers Overall transfer level: Needs assistance Equipment used: Rolling walker (2 wheeled) Transfers: Sit to/from Stand Sit to Stand: Min guard             Ambulation/Gait Ambulation/Gait assistance: Min guard Gait Distance (Feet): 40 Feet Assistive device: Rolling walker (2 wheeled) Gait Pattern/deviations: Step-through pattern   Gait velocity interpretation: <1.8 ft/sec, indicate of risk for recurrent falls     Stairs             Wheelchair Mobility    Modified Rankin (Stroke Patients Only)       Balance Overall balance assessment: Mild deficits observed, not formally tested                                          Cognition Arousal/Alertness: Awake/alert Behavior During Therapy: WFL for tasks assessed/performed Overall Cognitive Status: Within Functional Limits for tasks assessed                                        Exercises Other Exercises Other Exercises: Standing heel raises, marches, SLR x 10    General Comments        Pertinent Vitals/Pain Pain Assessment: No/denies pain    Home Living                      Prior Function            PT Goals (current goals can  now be found in the care plan section) Progress towards PT goals: Progressing toward goals    Frequency    Min 2X/week      PT Plan Current plan remains appropriate    Co-evaluation              AM-PAC PT "6 Clicks" Daily Activity  Outcome Measure  Difficulty turning over in bed (including adjusting bedclothes, sheets and blankets)?: None Difficulty moving from lying on back to sitting on the side of the bed? : None Difficulty sitting down on and standing up from a chair with arms (e.g., wheelchair, bedside commode, etc,.)?: None Help needed moving to and from a bed to chair (including a wheelchair)?: A Little Help needed walking in hospital room?: Total Help needed climbing 3-5 steps with a railing? : Total 6 Click Score: 17    End of Session Equipment Utilized During Treatment: Gait belt;Oxygen Activity Tolerance: Treatment limited secondary to medical complications  (Comment) Patient left: in chair;with chair alarm set;with call bell/phone within reach;with family/visitor present Nurse Communication: Mobility status       Time: 1030-1053 PT Time Calculation (min) (ACUTE ONLY): 23 min  Charges:  $Gait Training: 8-22 mins $Therapeutic Exercise: 8-22 mins                    G Codes:       Chesley Noon, PTA 03/09/18, 11:42 AM'

## 2018-03-09 NOTE — Plan of Care (Signed)
  Problem: Health Behavior/Discharge Planning: Goal: Ability to manage health-related needs will improve Outcome: Adequate for Discharge   Problem: Clinical Measurements: Goal: Ability to maintain clinical measurements within normal limits will improve Outcome: Adequate for Discharge Goal: Will remain free from infection Outcome: Adequate for Discharge Goal: Diagnostic test results will improve Outcome: Adequate for Discharge Goal: Respiratory complications will improve Outcome: Adequate for Discharge Goal: Cardiovascular complication will be avoided Outcome: Adequate for Discharge   Problem: Activity: Goal: Risk for activity intolerance will decrease Outcome: Adequate for Discharge   Problem: Nutrition: Goal: Adequate nutrition will be maintained Outcome: Adequate for Discharge   Problem: Elimination: Goal: Will not experience complications related to bowel motility Outcome: Adequate for Discharge Goal: Will not experience complications related to urinary retention Outcome: Adequate for Discharge   Problem: Safety: Goal: Ability to remain free from injury will improve Outcome: Adequate for Discharge   Problem: Skin Integrity: Goal: Risk for impaired skin integrity will decrease Outcome: Adequate for Discharge   Problem: Spiritual Needs Goal: Ability to function at adequate level Outcome: Adequate for Discharge

## 2018-03-09 NOTE — Progress Notes (Signed)
Daily Progress Note   Patient Name: Thomas Townsend       Date: 03/09/2018 DOB: 04/04/1931  Age: 84 y.o. MRN#: [id number] Attending Physician: Bettey Costa, MD Primary Care Physician: Elby Beck, FNP Admit Date: 03/05/2018  Reason for Consultation/Follow-up: Establishing goals of care  Subjective: Patient sitting up in bed, much more alert. Tells me he does not want to go to rehab; however, agrees to whatever is recommended. Tells me he does not want to eat because he is upset. Ready to go home.   Length of Stay: 4  Current Medications: Scheduled Meds:  . citalopram  20 mg Oral Daily  . digoxin  0.125 mg Oral BH-q7a  . ferrous sulfate  325 mg Oral q morning - 10a  . fluticasone furoate-vilanterol  1 puff Inhalation BH-q7a  . folic acid  1 mg Oral Daily  . haloperidol lactate  2 mg Intravenous Once  . methotrexate  2.5 mg Oral Weekly  . polyethylene glycol  17 g Oral Daily  . senna-docusate  1 tablet Oral Q1500  . warfarin  2 mg Oral Once per day on Mon Wed Fri   And  . warfarin  4 mg Oral Once per day on Sun Tue Thu Sat  . Warfarin - Pharmacist Dosing Inpatient   Does not apply q1800    Continuous Infusions: . azithromycin Stopped (03/08/18 1215)  . ceFEPime (MAXIPIME) IV Stopped (03/08/18 2308)    PRN Meds: acetaminophen **OR** [DISCONTINUED] acetaminophen, HYDROcodone-acetaminophen, ipratropium-albuterol, [DISCONTINUED] ondansetron **OR** ondansetron (ZOFRAN) IV, senna-docusate  Physical Exam   Constitutional: He is oriented to person, place, and time. He is alert. Head: Normocephalic and atraumatic.  Hard of hearing  Cardiovascular: An irregular rhythm present.  Pulmonary/Chest: Effort normal. He has decreased breath sounds. Abdominal: Soft.  Musculoskeletal:       Right  lower leg: He exhibits no edema.       Left lower leg: He exhibits no edema.  Neurological: He is oriented to person, place, and time and easily aroused.  Periods of confusion  Skin: There is pallor.         Vital Signs: BP 124/84 (BP Location: Right Arm)   Pulse 92   Temp (!) 97.5 F (36.4 C) (Oral)   Resp (!) 26   Ht '5\' 7"'  (1.702 m)   Wt 59.4 kg (130 lb 15.3 oz)   SpO2 96%   BMI 20.51 kg/m  SpO2: SpO2: 96 % O2 Device: O2 Device: Nasal Cannula O2 Flow Rate: O2 Flow Rate (L/min): 2 L/min  Intake/output summary:   Intake/Output Summary (Last 24 hours) at 03/09/2018 0856 Last data filed at 03/09/2018 1025 Gross per 24 hour  Intake 270 ml  Output 525 ml  Net -255 ml   LBM: Last BM Date: 03/08/18 Baseline Weight: Weight: 56.7 kg (124 lb 14.4 oz) Most recent weight: Weight: 59.4 kg (130 lb 15.3 oz)       Palliative Assessment/Data: PPS 40%    Flowsheet Rows     Most Recent Value  Intake Tab  Referral Department  Hospitalist  Unit at Time of Referral  Intermediate Care Unit  Palliative Care Primary Diagnosis  Sepsis/Infectious Disease  Date Notified  03/05/18  Palliative Care Type  Return patient Palliative Care  Reason for referral  Clarify Goals of Care  Date of Admission  03/05/18  Date first seen by Palliative Care  03/07/18  # of days Palliative referral response time  2 Day(s)  # of days IP prior to Palliative referral  0  Clinical Assessment  Palliative Performance Scale Score  40%  Psychosocial & Spiritual Assessment  Palliative Care Outcomes  Patient/Family meeting held?  Yes  Who was at the meeting?  daughter, [name]  Palliative Care Outcomes  Counseled regarding hospice, Provided psychosocial or spiritual support, Clarified goals of care      Patient Active Problem List   Diagnosis Date Noted  . Goals of care, counseling/discussion   . Palliative care by specialist   . Elevated lactic acid level   . Pneumonia due to infectious organism   . Long  term (current) use of anticoagulants 01/11/2018  . Hypertension 07/14/2017  . Altered mental status   . Palliative care encounter   . CLL (chronic lymphocytic leukemia) ([place]) 04/12/2017  . Acute metabolic encephalopathy 14/97/0263  . Aspiration pneumonia ([place]) 03/22/2017  . Right upper lobe pneumonia ([place]) 10/05/2016  . Chronic neutrophilia 08/26/2016  . Iron deficiency anemia due to chronic blood loss 08/26/2016  . Myeloproliferative neoplasm ([place]) 06/25/2016  . Anemia due to other cause 06/25/2016  . Diarrhea 02/26/2016  . Leukocytosis 01/14/2016  . Acidosis, lactic 08/11/2015  . Acute respiratory failure with hypoxia ([place]) 08/11/2015  . Pulmonary hypertension ([place]) 08/11/2015  . Mild aortic stenosis 08/11/2015  . Generalized weakness 08/11/2015  . Anemia of chronic disease 08/11/2015  . Sepsis ([place]) 08/06/2015  . Atrial fibrillation with rapid ventricular response ([place]) 08/06/2015  . CAP (community acquired pneumonia) 08/06/2015  . Rheumatoid arthritis involving multiple joints ([place]) 01/28/2015  . CAFL (chronic airflow limitation) ([place]) 03/04/2014  . Acid reflux 03/04/2014  . Chronic diastolic CHF (congestive heart failure) ([place]) 03/04/2014  . Atrial fibrillation ([place]) 03/04/2014  . COPD (chronic obstructive pulmonary disease) ([place]) 03/04/2014    Palliative Care Assessment & Plan   HPI: 84 y.o.malewith past medical history of CHF, afib, COPD, prostate CA, RA, HLD, HTN, stroke and ?leukemiaadmitted on 6/9/2019with nausea, vomiting, and diarrhea.He was diagnosed with pna and sepsis - possibly aspiration? He was recently admitted to the hospital in April for the same - HCAP and sepsis. Patient has been on bipap and HFNC. Also required pressor support. Now off of pressors and on 2L [place].   Per oncology, pthas been watched for many years with a suspicion of underlying myeloproliferative neoplasm, such as CMML; however,this hasnever beenconfirmed.Abone marrow biopsy is  recommended to confirm to CMML, however even ifCMML diagnosis is confirmed, there is no cure at patient's ageand with hismultiple comorbidities.Family agrees to no biopsy.  Patient has been seen by PMT before and is followed by outpatient palliative care. PMT consulted for [place].  Assessment: Patient out of ICU, off pressors, on nasal cannula. Noted that he did well with SLP eval. Tells me he feels better. Family relieved with improvement. They tell me he is eating better but still having periods of confusion. This morning he does not want to eat because he is upset about going to rehab.  Discussed with daughter plan of care moving forward. After speaking with her brothers, she has decided rehab with palliative to follow.   We discussed that patient is likely eligible for his hospice benefit and going home with hospice is also an option.   Daughter  discussed MOST form with patient and her brothers and has completed it - elects DNR, limited additional interventions, abx if indicated, iv fluids if indicated, and feeding tube if indicated. We discussed that some of these options do not align with what has previously been shared about [name]'s wishes. She tells me she understands and likely, she would never agree to feeding tube but would wait until the specific situation to decide. Encouraged her to continue these conversations with outpatient palliative. Her wishes seem to be for full comfort care but these are not the wishes of other family members. She has given my contact to her brothers who may call later today.  Recommendations/Plan:  Rehab with outpatient palliative  MOST form completed and placed on chart  Code Status:  DNR  Prognosis:   Unable to determine  Discharge Planning:  [place] for rehab with Palliative care service follow-up  Care plan was discussed with patient's daughter, [name]  Thank you for allowing the Palliative Medicine Team to assist  in the care of this patient.   Total Time 30 minutes Prolonged Time Billed  no       Greater than 50%  of this time was spent counseling and coordinating care related to the above assessment and plan.  Juel Burrow, DNP, [facility] Palliative Medicine Team Team Phone # [phone]  Pager [phone]

## 2018-03-09 NOTE — Progress Notes (Signed)
Report given to nurse at [facility]. Discharge paperwork printed and put in discharge packet. One IV taken out and tele monitor off. EMS called to transport patient. Once EMS arrived, secretary notified it would be a minute before I could go in because I was in another patient's room. When I went into patient's room, the patient was no longer there. EMS did not wait for me to go in there and take the other IV out or take a set of vital signs. Paperwork taken.

## 2018-03-09 NOTE — Discharge Summary (Signed)
[facility] NAME: Thomas Townsend    MR#:  [id number]  DATE OF BIRTH:  1931-06-19  DATE OF ADMISSION:  03/05/2018 ADMITTING PHYSICIAN: Bettey Costa, MD  DATE OF DISCHARGE: 03/09/2018  PRIMARY CARE PHYSICIAN: Elby Beck, FNP    ADMISSION DIAGNOSIS:  Elevated lactic acid level [R79.89] Nausea vomiting and diarrhea [R11.2, R19.7] Sepsis, due to unspecified organism ([place]) [A41.9] Leukocytosis, unspecified type [D72.829] Pneumonia due to infectious organism, unspecified laterality, unspecified part of lung [J18.9]  DISCHARGE DIAGNOSIS:  Active Problems:   Sepsis ([place])   Pneumonia due to infectious organism   Elevated lactic acid level   Goals of care, counseling/discussion   Palliative care by specialist   SECONDARY DIAGNOSIS:   Past Medical History:  Diagnosis Date  . Arthritis   . Atrial fibrillation ([place])   . CHF (congestive heart failure) ([place])   . COPD (chronic obstructive pulmonary disease) ([place])   . Dysrhythmia   . GERD (gastroesophageal reflux disease)   . Hyperlipemia   . Hypertension   . Leukocytosis 01/14/2016  . Prostate cancer ([place])   . Stroke [facility])     HOSPITAL COURSE:  84 year old male with history of essential hypertension hyperlipidemia, PAF  who presented to the emergency room due to nausea vomiting.  1.  Acute hypoxic respiratory failure in the setting of pneumonia He was initiated on cefepime and azithromycin and was discharged on Ceftin and Azithromycin. Stop date for antibiotics are 6/16. He has been weaned from BiPAP to high flow nasal cannula to regular nasal cannula.    Nasal cannula can be weaned off   2.  Sepsis due to multilobar pneumonia: Sepsis has resolved.  3.  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 He will follow-up with oncology after discharge. White blood cell count has improved which suggests this is reactive due to pneumonia.  4. Macrocytic  Anemia: Hemoglobin is stable.  Patient will follow-up with Dr. Rogue Bussing on outpatient basis for additional work-up.    5.  PAF: He will continue Coumadin and digoxin.  INR should be checked daily while he is on antibiotics.  6.  Hypotension: This has resolved.  He is off of pressors.  7.  Depression: Continue Celexa  8.  Electrolyte abnormalities: Replete PRN  9.  Arthritis: Continue methotrexate  Patient will continue outpatient palliative care upon discharge     DISCHARGE CONDITIONS AND DIET:   Stable for discharge on regular diet  CONSULTS OBTAINED:  Treatment Team:  Twana First, MD  DRUG ALLERGIES:  No Known Allergies  DISCHARGE MEDICATIONS:   Allergies as of 03/09/2018   No Known Allergies     Medication List    STOP taking these medications   acetaminophen 325 MG tablet Commonly known as:  TYLENOL   nystatin 100000 UNIT/ML suspension Commonly known as:  MYCOSTATIN     TAKE these medications   albuterol 108 (90 Base) MCG/ACT inhaler Commonly known as:  PROVENTIL HFA;VENTOLIN HFA Inhale 2 puffs into the lungs every 6 (six) hours as needed for wheezing or shortness of breath.   azithromycin 500 MG tablet Commonly known as:  ZITHROMAX Take 1 tablet (500 mg total) by mouth daily for 3 days. Take 1 tablet daily for 3 days.   BREO ELLIPTA 100-25 MCG/INH Aepb Generic drug:  fluticasone furoate-vilanterol Inhale 1 puff into the lungs every morning.   cefUROXime 500 MG tablet Commonly known as:  CEFTIN Take 1 tablet (500 mg total) by  mouth 2 (two) times daily with a meal for 4 days.   citalopram 20 MG tablet Commonly known as:  CELEXA Take 20 mg by mouth daily.   digoxin 0.125 MG tablet Commonly known as:  LANOXIN Take 1 tablet (0.125 mg total) every morning by mouth.   ferrous sulfate 325 (65 FE) MG tablet Take 325 mg by mouth every morning. Reported on 03/24/3150   folic acid 1 MG tablet Commonly known as:  FOLVITE Take 1 mg by mouth  daily.   furosemide 20 MG tablet Commonly known as:  LASIX Take 1 tablet (20 mg total) by mouth every other day. In the morning What changed:  additional instructions   methotrexate 2.5 MG tablet Commonly known as:  RHEUMATREX TAKE 6 TABLETS (15 MG TOTAL) BY MOUTH EVERY 7 (SEVEN) DAYS.   montelukast 10 MG tablet Commonly known as:  SINGULAIR Take 10 mg by mouth daily.   PRESERVISION AREDS PO Take 1 tablet by mouth 2 (two) times daily.   warfarin 4 MG tablet Commonly known as:  COUMADIN Take as directed. If you are unsure how to take this medication, talk to your nurse or doctor. Original instructions:  Take 2-4 mg by mouth one time only at 6 PM. 2MG -MONDAY,WEDNESDAY,FRIDAY 4MG -Sunday,TUESDAY,THURSDAY,SATURDAY         Today   CHIEF COMPLAINT:  Doing well this morning no acute issues   VITAL SIGNS:  Blood pressure 124/84, pulse 92, temperature (!) 97.5 F (36.4 C), temperature source Oral, resp. rate (!) 26, height 5\' 7"  (1.702 m), weight 59.4 kg (130 lb 15.3 oz), SpO2 96 %.   REVIEW OF SYSTEMS:  Review of Systems  Constitutional: Positive for malaise/fatigue. Negative for chills and fever.  HENT: Negative.  Negative for ear discharge, ear pain, hearing loss, nosebleeds and sore throat.   Eyes: Negative.  Negative for blurred vision and pain.  Respiratory: Negative.  Negative for cough, hemoptysis, shortness of breath and wheezing.   Cardiovascular: Negative.  Negative for chest pain, palpitations and leg swelling.  Gastrointestinal: Negative.  Negative for abdominal pain, blood in stool, diarrhea, nausea and vomiting.  Genitourinary: Negative.  Negative for dysuria.  Musculoskeletal: Negative.  Negative for back pain.  Skin: Negative.   Neurological: Negative for dizziness, tremors, speech change, focal weakness, seizures and headaches.  Endo/Heme/Allergies: Negative.  Does not bruise/bleed easily.  Psychiatric/Behavioral: Negative.  Negative for depression,  hallucinations and suicidal ideas.     PHYSICAL EXAMINATION:  GENERAL:  84 y.o.-year-old patient lying in the bed with no acute distress.  NECK:  Supple, no jugular venous distention. No thyroid enlargement, no tenderness.  LUNGS: Normal breath sounds bilaterally, no wheezing, rales,rhonchi  No use of accessory muscles of respiration.  CARDIOVASCULAR: S1, S2 normal. No murmurs, rubs, or gallops.  ABDOMEN: Soft, non-tender, non-distended. Bowel sounds present. No organomegaly or mass.  EXTREMITIES: No pedal edema, cyanosis, or clubbing.  PSYCHIATRIC: The patient is alert and oriented x 3.  SKIN: No obvious rash, lesion, or ulcer.   DATA REVIEW:   CBC Recent Labs  Lab 03/09/18 0623  WBC 18.1*  HGB 8.9*  HCT 26.8*  PLT 156    Chemistries  Recent Labs  Lab 03/07/18 0553 03/08/18 0516 03/09/18 0623  NA 135 140 137  K 3.5 3.4* 3.8  CL 109 111 108  CO2 21* 23 23  GLUCOSE 88 95 93  BUN 15 16 13   CREATININE 0.85 0.77 0.66  CALCIUM 7.4* 7.9* 8.0*  MG  --  2.2  --  AST 20  --   --   ALT 15*  --   --   ALKPHOS 61  --   --   BILITOT 1.0  --   --     Cardiac Enzymes Recent Labs  Lab 03/05/18 2127 03/06/18 0256 03/06/18 0902  TROPONINI 0.03* 0.03* 0.03*    Microbiology Results  @MICRORSLT48 @  RADIOLOGY:  Dg Chest Port 1 View  Result Date: 03/08/2018 CLINICAL DATA:  Respiratory failure EXAM: PORTABLE CHEST 1 VIEW COMPARISON:  March 06, 2018 and December 26, 2017 FINDINGS: There is patchy airspace opacity in the right base, suspicious for a focal area of pneumonia. There is underlying diffuse interstitial prominence consistent with chronic fibrotic type change. There is also felt to be a degree of underlying interstitial pulmonary edema. There are small pleural effusions bilaterally. There is cardiomegaly with pulmonary venous hypertension. There is aortic atherosclerosis. No adenopathy evident. No evident bone lesions. IMPRESSION: Underlying chronic interstitial disease with  probable degree of superimposed congestive heart failure. New airspace opacity right base concerning for developing pneumonia in this area superimposed on other changes. Cardiac silhouette is stable. There remains aortic atherosclerosis. Aortic Atherosclerosis (ICD10-I70.0). Electronically Signed   By: Lowella Grip III M.D.   On: 03/08/2018 07:23      Allergies as of 03/09/2018   No Known Allergies     Medication List    STOP taking these medications   acetaminophen 325 MG tablet Commonly known as:  TYLENOL   nystatin 100000 UNIT/ML suspension Commonly known as:  MYCOSTATIN     TAKE these medications   albuterol 108 (90 Base) MCG/ACT inhaler Commonly known as:  PROVENTIL HFA;VENTOLIN HFA Inhale 2 puffs into the lungs every 6 (six) hours as needed for wheezing or shortness of breath.   azithromycin 500 MG tablet Commonly known as:  ZITHROMAX Take 1 tablet (500 mg total) by mouth daily for 3 days. Take 1 tablet daily for 3 days.   BREO ELLIPTA 100-25 MCG/INH Aepb Generic drug:  fluticasone furoate-vilanterol Inhale 1 puff into the lungs every morning.   cefUROXime 500 MG tablet Commonly known as:  CEFTIN Take 1 tablet (500 mg total) by mouth 2 (two) times daily with a meal for 4 days.   citalopram 20 MG tablet Commonly known as:  CELEXA Take 20 mg by mouth daily.   digoxin 0.125 MG tablet Commonly known as:  LANOXIN Take 1 tablet (0.125 mg total) every morning by mouth.   ferrous sulfate 325 (65 FE) MG tablet Take 325 mg by mouth every morning. Reported on 7/74/1287   folic acid 1 MG tablet Commonly known as:  FOLVITE Take 1 mg by mouth daily.   furosemide 20 MG tablet Commonly known as:  LASIX Take 1 tablet (20 mg total) by mouth every other day. In the morning What changed:  additional instructions   methotrexate 2.5 MG tablet Commonly known as:  RHEUMATREX TAKE 6 TABLETS (15 MG TOTAL) BY MOUTH EVERY 7 (SEVEN) DAYS.   montelukast 10 MG tablet Commonly  known as:  SINGULAIR Take 10 mg by mouth daily.   PRESERVISION AREDS PO Take 1 tablet by mouth 2 (two) times daily.   warfarin 4 MG tablet Commonly known as:  COUMADIN Take as directed. If you are unsure how to take this medication, talk to your nurse or doctor. Original instructions:  Take 2-4 mg by mouth one time only at 6 PM. 2MG -MONDAY,WEDNESDAY,FRIDAY 4MG -Sunday,TUESDAY,THURSDAY,SATURDAY         Management plans discussed with  the patient and he is in agreement. Stable for discharge   Patient should follow up with pcp  CODE STATUS:     Code Status Orders  (From admission, onward)        Start     Ordered   03/05/18 0948  Do not attempt resuscitation (DNR)  Continuous    Question Answer Comment  In the event of cardiac or respiratory ARREST Do not call a "code blue"   In the event of cardiac or respiratory ARREST Do not perform Intubation, CPR, defibrillation or ACLS   In the event of cardiac or respiratory ARREST Use medication by any route, position, wound care, and other measures to relive pain and suffering. May use oxygen, suction and manual treatment of airway obstruction as needed for comfort.      03/05/18 0947    Code Status History    Date Active Date Inactive Code Status Order ID Comments User Context   12/27/2017 0311 12/29/2017 1935 DNR [id number]  Amelia Jo, MD Inpatient   06/30/2017 0039 07/03/2017 1915 DNR [id number]  Lance Coon, MD Inpatient   03/22/2017 2206 03/25/2017 1441 DNR [id number]  Ivor Costa, MD ED   10/05/2016 1048 10/07/2016 1534 Full Code [id number]  Hillary Bow, MD ED   06/21/2016 0222 06/26/2016 1939 Partial Code [id number]  Lance Coon, MD Inpatient   02/26/2016 1434 02/27/2016 1503 Partial Code [id number]  Vaughan Basta, MD Inpatient   08/06/2015 0259 08/11/2015 1521 Full Code [id number]  Hower, Aaron Mose, MD ED      TOTAL TIME TAKING CARE OF THIS PATIENT: 38 minutes.    Note: This dictation was prepared with Dragon dictation along  with smaller phrase technology. Any transcriptional errors that result from this process are unintentional.  Kailer Heindel M.D on 03/09/2018 at 9:32 AM  Between 7am to 6pm - Pager - [phone] After 6pm go to [email or web address] - password [email or web address] [facility]  Office  [phone]  CC: Primary care physician; Elby Beck, FNP

## 2018-03-09 NOTE — Clinical Social Work Placement (Signed)
   CLINICAL SOCIAL WORK PLACEMENT  NOTE  Date:  03/09/2018  Patient Details  Name: Thomas Townsend MRN: [id number] Date of Birth: July 02, 1931  Clinical Social Work is seeking post-discharge placement for this patient at the [place] level of care (*CSW will initial, date and re-position this form in  chart as items are completed):  Yes   Patient/family provided with [place] Work Department's list of facilities offering this level of care within the geographic area requested by the patient (or if unable, by the patient's family).  Yes   Patient/family informed of their freedom to choose among providers that offer the needed level of care, that participate in Medicare, Medicaid or managed care program needed by the patient, have an available bed and are willing to accept the patient.  Yes   Patient/family informed of [facility]'s ownership interest in [facility] and [facility], as well as of the fact that they are under no obligation to receive care at these facilities.  PASRR submitted to EDS on 03/08/18     PASRR number received on 03/08/18     Existing PASRR number confirmed on       FL2 transmitted to all facilities in geographic area requested by pt/family on 03/08/18     FL2 transmitted to all facilities within larger geographic area on       Patient informed that his/her managed care company has contracts with or will negotiate with certain facilities, including the following:        Yes   Patient/family informed of bed offers received.  Patient chooses bed at [facility]     Physician recommends and patient chooses bed at      Patient to be transferred to [facility] on 03/09/18.  Patient to be transferred to facility by [facility]     Patient family notified on 03/09/18 of transfer.  Name of family member notified:  Patient's daughter [name]     PHYSICIAN Please sign FL2, Please sign DNR     Additional  Comment:    _______________________________________________ Ross Ludwig, LCSWA 03/09/2018, 4:11 PM

## 2018-03-10 LAB — CULTURE, BLOOD (ROUTINE X 2)
Culture: NO GROWTH
Culture: NO GROWTH
SPECIAL REQUESTS: ADEQUATE
Special Requests: ADEQUATE

## 2018-03-14 ENCOUNTER — Non-Acute Institutional Stay: Payer: Self-pay | Admitting: Hospice and Palliative Medicine

## 2018-03-14 ENCOUNTER — Ambulatory Visit: Payer: Medicare Other

## 2018-03-14 DIAGNOSIS — Z515 Encounter for palliative care: Secondary | ICD-10-CM | POA: Diagnosis not present

## 2018-03-15 NOTE — Progress Notes (Signed)
PALLIATIVE CARE CONSULT VISIT   PATIENT NAME: Thomas Townsend DOB: [birth year] MRN: [id number]  PRIMARY CARE PROVIDER:   Elby Beck, FNP  REFERRING PROVIDER:  No referring provider defined for this encounter.  RESPONSIBLE PARTY:     ASSESSMENT:      I met with patient and his son. Recommend review of inpatient palliative care notes. Today, patient says he feels he is improving and is anxious to return home. Son says that prior to his recent hospitalization, patient was essentially independent with all care. He is now ambulatory for short distances with assistance but will clearly need increased assistance in the home. Son says the plan is to discharge home later this week. We talked about home hospice vs palliative care.   RECOMMENDATIONS and PLAN:  1. Recommend discharge home with palliative care following and plan for transition to hospice in the event of further decline 2. DNR  I spent 30 minutes providing this consultation,  from 1300 to 1330. More than 50% of the time in this consultation was spent coordinating communication.   HISTORY OF PRESENT ILLNESS:  Thomas Townsend is a 84 y.o. year old male with multiple medical problems including CHF, afib, COPD, prostate CA, RA, and leukocytosis concerning for CMML, who was recently hospitalized 03/05/18 to 03/09/18 with sepsis from PNA. Patient was seen by palliative care while hospitalized and we are asked to follow at the SNF.   CODE STATUS: DNR  PPS: 40% HOSPICE ELIGIBILITY/DIAGNOSIS: TBD  PAST MEDICAL HISTORY:  Past Medical History:  Diagnosis Date  . Arthritis   . Atrial fibrillation ([place])   . CHF (congestive heart failure) ([place])   . COPD (chronic obstructive pulmonary disease) ([place])   . Dysrhythmia   . GERD (gastroesophageal reflux disease)   . Hyperlipemia   . Hypertension   . Leukocytosis 01/14/2016  . Prostate cancer ([place])   . Stroke [facility])     SOCIAL HX:  Social History   Tobacco Use  . Smoking status:  Former Smoker    Years: 20.00    Types: Cigarettes  . Smokeless tobacco: Never Used  Substance Use Topics  . Alcohol use: Yes    Alcohol/week: 0.6 oz    Types: 1 Cans of beer per week    Comment: Wine every once in a while    ALLERGIES: No Known Allergies   PERTINENT MEDICATIONS:  Outpatient Encounter Medications as of 03/14/2018  Medication Sig  . albuterol (PROVENTIL HFA;VENTOLIN HFA) 108 (90 Base) MCG/ACT inhaler Inhale 2 puffs into the lungs every 6 (six) hours as needed for wheezing or shortness of breath. (Patient not taking: Reported on 01/18/2018)  . BREO ELLIPTA 100-25 MCG/INH AEPB Inhale 1 puff into the lungs every morning.  . citalopram (CELEXA) 20 MG tablet Take 20 mg by mouth daily.  . digoxin (LANOXIN) 0.125 MG tablet Take 1 tablet (0.125 mg total) every morning by mouth.  . ferrous sulfate 325 (65 FE) MG tablet Take 325 mg by mouth every morning. Reported on 05/28/5175  . folic acid (FOLVITE) 1 MG tablet Take 1 mg by mouth daily.  . furosemide (LASIX) 20 MG tablet Take 1 tablet (20 mg total) by mouth every other day. In the morning  . methotrexate (RHEUMATREX) 2.5 MG tablet TAKE 6 TABLETS (15 MG TOTAL) BY MOUTH EVERY 7 (SEVEN) DAYS.  Marland Kitchen montelukast (SINGULAIR) 10 MG tablet Take 10 mg by mouth daily.   . Multiple Vitamins-Minerals (PRESERVISION AREDS PO) Take 1 tablet by mouth 2 (  two) times daily.   Marland Kitchen warfarin (COUMADIN) 4 MG tablet Take 2-4 mg by mouth one time only at 6 PM. 2MG-MONDAY,WEDNESDAY,FRIDAY 4MG-Sunday,TUESDAY,THURSDAY,SATURDAY   No facility-administered encounter medications on file as of 03/14/2018.     PHYSICAL EXAM:   General: NAD, frail appearing, thin Cardiovascular: irregular Pulmonary: clear ant fields Abdomen: soft, nontender, + bowel sounds GU: no suprapubic tenderness Extremities: no edema, no joint deformities Skin: no rashes Neurological: Weakness but otherwise nonfocal  Irean Hong, NP

## 2018-03-17 ENCOUNTER — Inpatient Hospital Stay: Payer: Medicare Other | Admitting: Family Medicine

## 2018-03-20 ENCOUNTER — Telehealth: Payer: Self-pay | Admitting: Family Medicine

## 2018-03-20 NOTE — Telephone Encounter (Signed)
Copied from [place] [id number]. Topic: Quick Communication - See Telephone Encounter >> Mar 20, 2018 10:26 AM Synthia Innocent wrote: CRM for notification. See Telephone encounter for: 03/20/18. Requesting verbal orders for palliative care, patient has been discharged back home. Please advise

## 2018-03-20 NOTE — Telephone Encounter (Signed)
Called and gave verbal orders as requested.  

## 2018-03-23 ENCOUNTER — Ambulatory Visit (INDEPENDENT_AMBULATORY_CARE_PROVIDER_SITE_OTHER): Payer: Medicare Other | Admitting: General Practice

## 2018-03-23 DIAGNOSIS — Z7901 Long term (current) use of anticoagulants: Secondary | ICD-10-CM | POA: Diagnosis not present

## 2018-03-23 DIAGNOSIS — I4891 Unspecified atrial fibrillation: Secondary | ICD-10-CM

## 2018-03-23 LAB — POCT INR: INR: 2.3 (ref 2.0–3.0)

## 2018-03-23 NOTE — Patient Instructions (Addendum)
Pre visit review using our clinic review tool, if applicable. No additional management support is needed unless otherwise documented below in the visit note.  Continue to take 1 tablet daily except take 1/2 take tablet on Monday/Wed/Fri.  Re-check in 4 weeks.

## 2018-03-26 DIAGNOSIS — J189 Pneumonia, unspecified organism: Secondary | ICD-10-CM | POA: Diagnosis not present

## 2018-03-27 ENCOUNTER — Ambulatory Visit: Payer: Medicare Other | Admitting: Family Medicine

## 2018-03-27 DIAGNOSIS — Z0289 Encounter for other administrative examinations: Secondary | ICD-10-CM

## 2018-04-05 ENCOUNTER — Encounter: Payer: Self-pay | Admitting: Family Medicine

## 2018-04-05 ENCOUNTER — Ambulatory Visit (INDEPENDENT_AMBULATORY_CARE_PROVIDER_SITE_OTHER): Payer: Medicare Other | Admitting: Family Medicine

## 2018-04-05 VITALS — BP 92/52 | HR 77 | Temp 97.5°F | Ht 67.0 in | Wt 133.0 lb

## 2018-04-05 DIAGNOSIS — M25512 Pain in left shoulder: Secondary | ICD-10-CM

## 2018-04-05 DIAGNOSIS — I482 Chronic atrial fibrillation, unspecified: Secondary | ICD-10-CM

## 2018-04-05 DIAGNOSIS — I959 Hypotension, unspecified: Secondary | ICD-10-CM

## 2018-04-05 DIAGNOSIS — L608 Other nail disorders: Secondary | ICD-10-CM | POA: Diagnosis not present

## 2018-04-05 NOTE — Progress Notes (Signed)
Subjective:    Patient ID: Thomas Townsend, male    DOB: [birth year], 84 y.o.   MRN: [id number]  HPI This is an 84 yo male, accompanied by his son. He has been doing well. Good appetite. No falls. No chest pain, no SOB, some left shoulder pain that started yesterday (no known injury), no dizziness/lightheadedness.  Continued decrease in vision, sees Dr. Zigmund Daniel for macular degeneration. Can't read. Can watch TV, maneuver around his home without difficulty.  Went for pedicure and right great toenail is "crumbling." No pain.  Son reports no needs currently.   Past Medical History:  Diagnosis Date  . Arthritis   . Atrial fibrillation ([place])   . CHF (congestive heart failure) ([place])   . COPD (chronic obstructive pulmonary disease) ([place])   . Dysrhythmia   . GERD (gastroesophageal reflux disease)   . Hyperlipemia   . Hypertension   . Leukocytosis 01/14/2016  . Prostate cancer ([place])   . Stroke [facility])    Past Surgical History:  Procedure Laterality Date  . CHOLECYSTECTOMY    . COLONOSCOPY WITH PROPOFOL N/A 04/04/2015   Procedure: COLONOSCOPY WITH PROPOFOL;  Surgeon: Manya Silvas, MD;  Location[facility]COPY;  Service: Endoscopy;  Laterality: N/A;  . ESOPHAGOGASTRODUODENOSCOPY N/A 04/04/2015   Procedure: ESOPHAGOGASTRODUODENOSCOPY (EGD);  Surgeon: Manya Silvas, MD;  Location[facility] ENDOSCOPY;  Service: Endoscopy;  Laterality: N/A;  . EYE SURGERY     Family History  Problem Relation Age of Onset  . Hypertension Other    Social History   Tobacco Use  . Smoking status: Former Smoker    Years: 20.00    Types: Cigarettes  . Smokeless tobacco: Never Used  Substance Use Topics  . Alcohol use: Yes    Alcohol/week: 0.6 oz    Types: 1 Cans of beer per week    Comment: Wine every once in a while  . Drug use: No      Review of Systems Per HPI    Objective:   Physical Exam  Constitutional: He is oriented to person, place, and time. He appears well-developed and  well-nourished. No distress.  HENT:  Head: Normocephalic and atraumatic.  Eyes: Conjunctivae are normal.  Cardiovascular: Normal rate. An irregular rhythm present.  Pulmonary/Chest: Effort normal and breath sounds normal.  Musculoskeletal:  Left shoulder with good ROM. Left UE with good strength. Non tender to palpation.  Easily arises from chair and ambulates independently.   Neurological: He is alert and oriented to person, place, and time.  Skin: Skin is warm and dry. He is not diaphoretic.  Right great toenail with distal 2/3 thickened and yellow, new growth without discoloration.   Psychiatric: He has a normal mood and affect. His behavior is normal. Judgment and thought content normal.  Vitals reviewed.     BP (!) 86/49 (BP Location: Left Arm, Patient Position: Sitting, Cuff Size: Normal)   Pulse 77   Temp (!) 97.5 F (36.4 C) (Oral)   Ht 5\' 7"  (1.702 m)   Wt 133 lb (60.3 kg)   SpO2 96%   BMI 20.83 kg/m  Wt Readings from Last 3 Encounters:  04/05/18 133 lb (60.3 kg)  03/09/18 137 lb 1.6 oz (62.2 kg)  01/06/18 132 lb (59.9 kg)   BP Readings from Last 3 Encounters:  04/05/18 (!) 86/49  03/09/18 124/84  01/18/18 110/70   Recheck BP 92/52    Assessment & Plan:  1. Hypotension, unspecified hypotension type - asymptomatic, encouraged good fluid intake -  will decrease furosemide to 10 mg every other day- written instructions provided - recheck BP at next coumadin visit - RTC if any dizziness/lightheadedness  2. Acute pain of left shoulder - gentle ROM, tylenol as needed  3. Acquired dysmorphic toenail - reassured patient  4. Chronic atrial fibrillation (HCC) - rate controlled, last INR 2.3 03/23/18   Clarene Reamer, FNP-BC  [facility]  04/05/2018 9:26 AM

## 2018-04-05 NOTE — Patient Instructions (Signed)
Blood pressure a little low today, decrease lasix (furosemide) to 1/2 tablet every other day  Will recheck blood pressure at coumadin visit  Gentle range of motion of shoulder and tylenol for pain  Follow up in 3-4 months, sooner if needed

## 2018-04-07 ENCOUNTER — Ambulatory Visit: Payer: Medicare Other | Admitting: Family Medicine

## 2018-04-10 DIAGNOSIS — Z515 Encounter for palliative care: Secondary | ICD-10-CM | POA: Diagnosis not present

## 2018-04-12 ENCOUNTER — Inpatient Hospital Stay: Payer: Medicare Other | Attending: Internal Medicine | Admitting: Internal Medicine

## 2018-04-12 ENCOUNTER — Inpatient Hospital Stay: Payer: Medicare Other

## 2018-04-12 VITALS — BP 99/57 | HR 66 | Temp 97.1°F | Resp 16 | Wt 133.6 lb

## 2018-04-12 DIAGNOSIS — D471 Chronic myeloproliferative disease: Secondary | ICD-10-CM

## 2018-04-12 DIAGNOSIS — I4891 Unspecified atrial fibrillation: Secondary | ICD-10-CM

## 2018-04-12 DIAGNOSIS — M069 Rheumatoid arthritis, unspecified: Secondary | ICD-10-CM | POA: Diagnosis not present

## 2018-04-12 DIAGNOSIS — I509 Heart failure, unspecified: Secondary | ICD-10-CM | POA: Diagnosis not present

## 2018-04-12 DIAGNOSIS — Z7901 Long term (current) use of anticoagulants: Secondary | ICD-10-CM | POA: Insufficient documentation

## 2018-04-12 DIAGNOSIS — D72829 Elevated white blood cell count, unspecified: Secondary | ICD-10-CM | POA: Diagnosis not present

## 2018-04-12 DIAGNOSIS — C61 Malignant neoplasm of prostate: Secondary | ICD-10-CM

## 2018-04-12 DIAGNOSIS — D649 Anemia, unspecified: Secondary | ICD-10-CM | POA: Diagnosis not present

## 2018-04-12 DIAGNOSIS — Z87891 Personal history of nicotine dependence: Secondary | ICD-10-CM | POA: Insufficient documentation

## 2018-04-12 LAB — CBC WITH DIFFERENTIAL/PLATELET
BASOS PCT: 0 %
Basophils Absolute: 0.1 10*3/uL (ref 0–0.1)
EOS ABS: 0.1 10*3/uL (ref 0–0.7)
EOS PCT: 1 %
HCT: 27.3 % — ABNORMAL LOW (ref 40.0–52.0)
HEMOGLOBIN: 9 g/dL — AB (ref 13.0–18.0)
Lymphocytes Relative: 7 %
Lymphs Abs: 1.1 10*3/uL (ref 1.0–3.6)
MCH: 33.2 pg (ref 26.0–34.0)
MCHC: 33 g/dL (ref 32.0–36.0)
MCV: 100.4 fL — ABNORMAL HIGH (ref 80.0–100.0)
Monocytes Absolute: 1.3 10*3/uL — ABNORMAL HIGH (ref 0.2–1.0)
Monocytes Relative: 9 %
NEUTROS PCT: 83 %
Neutro Abs: 13 10*3/uL — ABNORMAL HIGH (ref 1.4–6.5)
PLATELETS: 119 10*3/uL — AB (ref 150–440)
RBC: 2.72 MIL/uL — AB (ref 4.40–5.90)
RDW: 18.3 % — ABNORMAL HIGH (ref 11.5–14.5)
WBC: 15.6 10*3/uL — AB (ref 3.8–10.6)

## 2018-04-12 LAB — COMPREHENSIVE METABOLIC PANEL
ALK PHOS: 75 U/L (ref 38–126)
ALT: 9 U/L (ref 0–44)
AST: 17 U/L (ref 15–41)
Albumin: 4 g/dL (ref 3.5–5.0)
Anion gap: 8 (ref 5–15)
BILIRUBIN TOTAL: 0.8 mg/dL (ref 0.3–1.2)
BUN: 18 mg/dL (ref 8–23)
CALCIUM: 8.6 mg/dL — AB (ref 8.9–10.3)
CO2: 25 mmol/L (ref 22–32)
CREATININE: 0.96 mg/dL (ref 0.61–1.24)
Chloride: 103 mmol/L (ref 98–111)
Glucose, Bld: 123 mg/dL — ABNORMAL HIGH (ref 70–99)
Potassium: 3.4 mmol/L — ABNORMAL LOW (ref 3.5–5.1)
Sodium: 136 mmol/L (ref 135–145)
TOTAL PROTEIN: 7.1 g/dL (ref 6.5–8.1)

## 2018-04-12 LAB — PSA: Prostatic Specific Antigen: <0.01 ng/mL (ref 0.00–4.00)

## 2018-04-12 NOTE — Assessment & Plan Note (Addendum)
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  Today white count is 15 hemoglobin 9-10 platelets of 100.  Overall stable/patient clinically asymptomatic.  Continue surveillance at this time.  #Given patient's overall frail clinical status/multiple medical problems-including multiple pneumonias; I think it is reasonable to hold off any further work-up like bone marrow biopsy at this time.  Patient and his daughter agree.  # /A.fib/CHF controlled- compensated; stable on Coumadin  # Prostate cancer early stage; clinically stable; off therapy.  Check PSA at next visit.  Work- [phone]/ [name] [daughter/HPOA] to call this number.  Given overall frail condition multiple comorbidities-I agree with patient being followed by palliative care.  # Follow-up in 4 months with labs. The above plan of care was discussed with the patient and his daughter in detail.

## 2018-04-12 NOTE — Progress Notes (Signed)
[facility] OFFICE PROGRESS NOTE  Patient Care Team: Elby Beck, FNP as PCP - General (Nurse Practitioner) Allyne Gee, MD (Internal Medicine)   SUMMARY OF HEMATOLOGIC/ONCOLOGIC HISTORY:  # Leucocytosis- 20-30K [Dr.Pandit-BMBx 2013-No definitive features of a Myeloproliferative neoplasia including CMML, normal cytogenetics (68 XY. Normocellular to focally mildly hypocellular marrow for age (30-50%) with trilineage hematopoiesis, no overt monocytosis ; Flow study unremarkable. Peripheral blood flow- suggestive of chronic myelomonocytic leukemia]; JAK2/Bcr-Abl-NEG.2013- [place]- No hepato-splenomegaly.   # Prostate cancer [early stage] f/u Dr.Tennenbaum [place]- on surveillance  # IDA- ? Etiology.  # RA on MXT; prednisone 70m discon;    INTERVAL HISTORY:  A very pleasant 84-year-old male patient with a prior history of long-standing leukocytosis/myeloproliferative neoplasm unclassified and mild anemia is here for follow-up.    Patient interim has not had any pneumonias.  Has not had any recent hospitalizations.  Chronic shortness of breath chronic cough.  Not any worse.  Denies any significant fatigue.  Appetite is fair.  No night sweats or fevers or chills.  Denies any bone pain.  REVIEW OF SYSTEMS:  A complete 10 point review of system is done which is negative except mentioned above/history of present illness.   PAST MEDICAL HISTORY :  Past Medical History:  Diagnosis Date  . Arthritis   . Atrial fibrillation ([place]   . CHF (congestive heart failure) ([place]   . COPD (chronic obstructive pulmonary disease) ([place]   . Dysrhythmia   . GERD (gastroesophageal reflux disease)   . Hyperlipemia   . Hypertension   . Leukocytosis 01/14/2016  . [place]state cancer ([place]   . Stroke ([facility]     PAST SURGICAL HISTORY :   Past Surgical History:  Procedure Laterality Date  . CHOLECYSTECTOMY    . COLONOSCOPY WITH PROPOFOL N/A 04/04/2015   Procedure: COLONOSCOPY WITH PROPOFOL;   Surgeon: RManya Silvas MD;  [facility];  Service: Endoscopy;  Laterality: N/A;  . ESOPHAGOGASTRODUODENOSCOPY N/A 04/04/2015   Procedure: ESOPHAGOGASTRODUODENOSCOPY (EGD);  Surgeon: RManya Silvas MD;  [facility];  Service: Endoscopy;  Laterality: N/A;  . EYE SURGERY      FAMILY HISTORY :   Family History  Problem Relation Age of Onset  . Hypertension Other     SOCIAL HISTORY:   Social History   Tobacco Use  . Smoking status: Former Smoker    Years: 20.00    Types: Cigarettes  . Smokeless tobacco: Never Used  Substance Use Topics  . Alcohol use: Yes    Alcohol/week: 0.6 oz    Types: 1 Cans of beer per week    Comment: Wine every once in a while  . Drug use: No    ALLERGIES:  has No Known Allergies.  MEDICATIONS:  Current Outpatient Medications  Medication Sig Dispense Refill  . albuterol (PROVENTIL HFA;VENTOLIN HFA) 108 (90 Base) MCG/ACT inhaler Inhale 2 puffs into the lungs every 6 (six) hours as needed for wheezing or shortness of breath. 8 g 0  . BREO ELLIPTA 100-25 MCG/INH AEPB Inhale 1 puff into the lungs every morning. 60 each 5  . citalopram (CELEXA) 20 MG tablet Take 20 mg by mouth daily.    . digoxin (LANOXIN) 0.125 MG tablet Take 1 tablet (0.125 mg total) every morning by mouth. 90 tablet 1  . ferrous sulfate 325 (65 FE) MG tablet Take 325 mg by mouth every morning. Reported on 51/32/4401   . folic acid (FOLVITE) 1 MG tablet Take 1 mg by  mouth daily.    . furosemide (LASIX) 20 MG tablet Take 1 tablet (20 mg total) by mouth every other day. In the morning    . methotrexate (RHEUMATREX) 2.5 MG tablet TAKE 6 TABLETS (15 MG TOTAL) BY MOUTH EVERY 7 (SEVEN) DAYS.  4  . montelukast (SINGULAIR) 10 MG tablet Take 10 mg by mouth daily.     . Multiple Vitamins-Minerals (PRESERVISION AREDS PO) Take 1 tablet by mouth 2 (two) times daily.     Marland Kitchen warfarin (COUMADIN) 4 MG tablet Take 2-4 mg by mouth one time only at 6 PM.  2MG-MONDAY,WEDNESDAY,FRIDAY 4MG-Sunday,TUESDAY,THURSDAY,SATURDAY     No current facility-administered medications for this visit.     PHYSICAL EXAMINATION: ECOG PERFORMANCE STATUS: \  BP (!) 99/57 (BP Location: Left Arm, Patient Position: Sitting)   Pulse 66   Temp (!) 97.1 F (36.2 C) (Tympanic)   Resp 16   Wt 133 lb 9.6 oz (60.6 kg)   BMI 20.92 kg/m   Filed Weights   04/12/18 1546  Weight: 133 lb 9.6 oz (60.6 kg)    GENERAL: Frail-appearing Caucasian male patient alert, no distress and comfortable.  He is accompanied by his daughter EYES: no pallor or icterus OROPHARYNX: no thrush or ulceration; NECK: supple; no lymph nodes felt. LYMPH:  no palpable lymphadenopathy in the axillary or inguinal regions LUNGS: Decreased breath sounds auscultation bilaterally. No wheeze or crackles HEART/CVS: regular rate & rhythm and no murmurs; No lower extremity edema ABDOMEN:abdomen soft, non-tender and normal bowel sounds. No hepatomegaly or splenomegaly.  Musculoskeletal:no cyanosis of digits and no clubbing  PSYCH: alert & oriented x 3 with fluent speech NEURO: no focal motor/sensory deficits SKIN:  n multiple bruises. LABORATORY DATA:  I have reviewed the data as listed    Component Value Date/Time   NA 136 04/12/2018 1455   NA 140 01/22/2015 0345   K 3.4 (L) 04/12/2018 1455   K 3.7 01/22/2015 0345   CL 103 04/12/2018 1455   CL 106 01/22/2015 0345   CO2 25 04/12/2018 1455   CO2 25 01/22/2015 0345   GLUCOSE 123 (H) 04/12/2018 1455   GLUCOSE 89 01/22/2015 0345   BUN 18 04/12/2018 1455   BUN 16 01/22/2015 0345   CREATININE 0.96 04/12/2018 1455   CREATININE 0.88 01/22/2015 0345   CALCIUM 8.6 (L) 04/12/2018 1455   CALCIUM 7.7 (L) 01/22/2015 0345   PROT 7.1 04/12/2018 1455   PROT 6.8 01/21/2015 1946   ALBUMIN 4.0 04/12/2018 1455   ALBUMIN 3.3 (L) 01/21/2015 1946   AST 17 04/12/2018 1455   AST 20 01/21/2015 1946   ALT 9 04/12/2018 1455   ALT 6 (L) 01/21/2015 1946    ALKPHOS 75 04/12/2018 1455   ALKPHOS 86 01/21/2015 1946   BILITOT 0.8 04/12/2018 1455   BILITOT 0.9 01/21/2015 1946   GFRNONAA >60 04/12/2018 1455   GFRNONAA >60 01/22/2015 0345   GFRAA >60 04/12/2018 1455   GFRAA >60 01/22/2015 0345    No results found for: SPEP, UPEP  Lab Results  Component Value Date   WBC 15.6 (H) 04/12/2018   NEUTROABS 13.0 (H) 04/12/2018   HGB 9.0 (L) 04/12/2018   HCT 27.3 (L) 04/12/2018   MCV 100.4 (H) 04/12/2018   PLT 119 (L) 04/12/2018      Chemistry      Component Value Date/Time   NA 136 04/12/2018 1455   NA 140 01/22/2015 0345   K 3.4 (L) 04/12/2018 1455   K 3.7 01/22/2015 0345   CL  103 04/12/2018 1455   CL 106 01/22/2015 0345   CO2 25 04/12/2018 1455   CO2 25 01/22/2015 0345   BUN 18 04/12/2018 1455   BUN 16 01/22/2015 0345   CREATININE 0.96 04/12/2018 1455   CREATININE 0.88 01/22/2015 0345      Component Value Date/Time   CALCIUM 8.6 (L) 04/12/2018 1455   CALCIUM 7.7 (L) 01/22/2015 0345   ALKPHOS 75 04/12/2018 1455   ALKPHOS 86 01/21/2015 1946   AST 17 04/12/2018 1455   AST 20 01/21/2015 1946   ALT 9 04/12/2018 1455   ALT 6 (L) 01/21/2015 1946   BILITOT 0.8 04/12/2018 1455   BILITOT 0.9 01/21/2015 1946         ASSESSMENT & PLAN:   Myeloproliferative neoplasm ([place]) # Leukocytosis predominant neutrophilia- again unclear etiology- 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  Today white count is 15 hemoglobin 9-10 platelets of 100.  Overall stable/patient clinically asymptomatic.  Continue surveillance at this time.  #Given patient's overall frail clinical status/multiple medical problems-including multiple pneumonias; I think it is reasonable to hold off any further work-up like bone marrow biopsy at this time.  Patient and his daughter agree.  # /A.fib/CHF controlled- compensated; stable on Coumadin  # Prostate cancer early stage; clinically stable; off therapy.  Check PSA at next visit.  Work- [phone]/ [name]  [daughter/HPOA] to call this number.  Given overall frail condition multiple comorbidities-I agree with patient being followed by palliative care.  # Follow-up in 4 months with labs. The above plan of care was discussed with the patient and his daughter in detail.   Cammie Sickle, MD 04/12/2018 7:47 PM

## 2018-04-16 ENCOUNTER — Other Ambulatory Visit: Payer: Self-pay | Admitting: Internal Medicine

## 2018-04-19 ENCOUNTER — Other Ambulatory Visit: Payer: Medicare Other

## 2018-04-19 ENCOUNTER — Ambulatory Visit: Payer: Medicare Other | Admitting: Internal Medicine

## 2018-04-20 ENCOUNTER — Ambulatory Visit (INDEPENDENT_AMBULATORY_CARE_PROVIDER_SITE_OTHER): Payer: Medicare Other | Admitting: General Practice

## 2018-04-20 DIAGNOSIS — Z7901 Long term (current) use of anticoagulants: Secondary | ICD-10-CM

## 2018-04-20 DIAGNOSIS — I4891 Unspecified atrial fibrillation: Secondary | ICD-10-CM | POA: Diagnosis not present

## 2018-04-20 LAB — POCT INR: INR: 1.3 — AB (ref 2.0–3.0)

## 2018-04-20 NOTE — Patient Instructions (Addendum)
Pre visit review using our clinic review tool, if applicable. No additional management support is needed unless otherwise documented below in the visit note.  Take 1 1/2 tablets today (7/25) and take 1 tablet tomorrow (7/26), take 1 1/2 tablets on Saturday, (7/27).  On Sunday continue to take 1 tablet daily except 1/2 tablet on Monday/Wed/Fridays.  Re-check in 2 to 3 weeks.

## 2018-04-24 ENCOUNTER — Telehealth: Payer: Self-pay | Admitting: Family Medicine

## 2018-04-24 DIAGNOSIS — Z79899 Other long term (current) drug therapy: Secondary | ICD-10-CM | POA: Diagnosis not present

## 2018-04-24 DIAGNOSIS — M0579 Rheumatoid arthritis with rheumatoid factor of multiple sites without organ or systems involvement: Secondary | ICD-10-CM | POA: Diagnosis not present

## 2018-04-24 NOTE — Telephone Encounter (Signed)
Glenda Chroman FNP out of office today;Note brought in by daughter is in Dr Josefine Class in basket.Please advise. Pt last seen 04/05/18.

## 2018-04-24 NOTE — Telephone Encounter (Signed)
Copied from [place] [id number]. Topic: Quick Communication - See Telephone Encounter >> Apr 24, 2018 12:50 PM Antonieta Iba C wrote: CRM for notification. See Telephone encounter for: 04/24/18.  Pt's daughter called in to be advised. She said that pt was just seen by PCP recently. She said that pt made the statement to her that he is depressed. Pt is currently taking an anti-depressant, she would like to be advised on is provider could increase anti-depressant OR if another ov is needed. Daughter is not sure of the name of medication, she has an ov later today, she will let office know the name of the medication then to be advised

## 2018-04-24 NOTE — Telephone Encounter (Signed)
Best number [phone] Daughter (donna) dropped of name of anxiety med for mr Flatley  She stated he needed a different one and was told debbie needed name of med pt was on  Paperwork in [place]

## 2018-04-25 DIAGNOSIS — J189 Pneumonia, unspecified organism: Secondary | ICD-10-CM | POA: Diagnosis not present

## 2018-04-25 NOTE — Telephone Encounter (Signed)
Patient has a coumadin visit for next week. Please call and see if he can come a day earlier or a day later and have an office visit scheduled with me before or after his coumadin visit so we can talk about his symptoms and whether medication should be increased. He nor his son expressed concerns about worsening depression when I saw him the other week.

## 2018-04-25 NOTE — Telephone Encounter (Signed)
Is PCP going to be checking messages this week?  There wasn't a long discussion noted about this in the chart and I would like her input.

## 2018-04-25 NOTE — Telephone Encounter (Signed)
Per POA donna is ok with making two appt one for coumadin and one for ov with gessner. Coumadin is not opening everyday.

## 2018-04-25 NOTE — Telephone Encounter (Signed)
Spoke with POA, [name].  She is very concerned about her father and would like him seen before the end of next week.  I was able to schedule him a 30 minute appointment with Glenda Chroman, NP for Monday 05/01/18 at 945am. I will do coumadin check at the same time as the appointment on Monday.  [name] is aware that if patient worsens she should either take him to the ER or call for sooner appointment this week, symptoms permitting, with another provider as D. Carlean Purl, NP is out on vacay this week.  She thanks me for coordinating the earlier appointment and thinks that Monday will be fine.

## 2018-05-01 ENCOUNTER — Ambulatory Visit (INDEPENDENT_AMBULATORY_CARE_PROVIDER_SITE_OTHER): Payer: Medicare Other | Admitting: Family Medicine

## 2018-05-01 ENCOUNTER — Ambulatory Visit: Payer: Medicare Other

## 2018-05-01 ENCOUNTER — Encounter: Payer: Self-pay | Admitting: Family Medicine

## 2018-05-01 VITALS — BP 102/52 | HR 89 | Temp 97.8°F | Ht 67.0 in | Wt 135.0 lb

## 2018-05-01 DIAGNOSIS — Z7901 Long term (current) use of anticoagulants: Secondary | ICD-10-CM

## 2018-05-01 DIAGNOSIS — I4891 Unspecified atrial fibrillation: Secondary | ICD-10-CM

## 2018-05-01 DIAGNOSIS — M546 Pain in thoracic spine: Secondary | ICD-10-CM

## 2018-05-01 DIAGNOSIS — G8929 Other chronic pain: Secondary | ICD-10-CM

## 2018-05-01 DIAGNOSIS — F341 Dysthymic disorder: Secondary | ICD-10-CM | POA: Diagnosis not present

## 2018-05-01 DIAGNOSIS — B370 Candidal stomatitis: Secondary | ICD-10-CM | POA: Diagnosis not present

## 2018-05-01 LAB — POCT INR: INR: 1.8 — AB (ref 2.0–3.0)

## 2018-05-01 NOTE — Patient Instructions (Signed)
Please go back to using your mouth wash for your thrush on your tongue  Can use tylenol, two tablets daily as needed for pain  Follow up in 2 months

## 2018-05-01 NOTE — Progress Notes (Signed)
Subjective:    Patient ID: Thomas Townsend, male    DOB: 1931-04-09, 84 y.o.   MRN: [id number]  HPI This is an 84 yo male, accompanied by his son, [name], who presents today following a fall 2 weeks ago and with increased depression.   Depression- Currently on citalopram 20 mg. His daughter called in last week to report that her father's depression has worsened. He has been followed by palliative care and has myeloproliferative disorder for which he is not seeking treatment. He reports that he is not unhappy. 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 His hearing is poor and his vision is failing (has regular eye appointments, unable to read and watch TV. Sleeping ok. Appetite decreased. He thinks about dying and ways to take his life (gun- but doesn't have a gun, carbon monoxide poisoning) but states multiple times that he would not take his life because he believes God is in control of his time to die.  Fall/back pain- fell two weeks ago. According to patient, he fell going through interior door way and was unable to grab anything to get up. His son, [name], with whom he lives, was able to get him up. His mid back was a little sore, not sore now. No bruising/bleeding or LOC. Has several old injuries (fell off tractor, was hit by a bull) and has occasional back pain. No radiation, weakness, numbness/tingling.   Oral thrush- takes breo ellipta, doesn't always rinse mouth after using. Has coating on tongue. Has bottle of ?nystatin swish and swallow at home that he hasn't used recently.   Past Medical History:  Diagnosis Date  . Arthritis   . Atrial fibrillation ([place])   . CHF (congestive heart failure) ([place])   . COPD (chronic obstructive pulmonary disease) ([place])   . Dysrhythmia   . GERD (gastroesophageal reflux disease)   . Hyperlipemia   . Hypertension   .  Leukocytosis 01/14/2016  . Prostate cancer ([place])   . Stroke [facility])    Past Surgical History:  Procedure Laterality Date  . CHOLECYSTECTOMY    . COLONOSCOPY WITH PROPOFOL N/A 04/04/2015   Procedure: COLONOSCOPY WITH PROPOFOL;  Surgeon: Manya Silvas, MD;  Location[facility]COPY;  Service: Endoscopy;  Laterality: N/A;  . ESOPHAGOGASTRODUODENOSCOPY N/A 04/04/2015   Procedure: ESOPHAGOGASTRODUODENOSCOPY (EGD);  Surgeon: Manya Silvas, MD;  Location[facility];  Service: Endoscopy;  Laterality: N/A;  . EYE SURGERY     Family History  Problem Relation Age of Onset  . Hypertension Other    Social History   Tobacco Use  . Smoking status: Former Smoker    Years: 20.00    Types: Cigarettes  . Smokeless tobacco: Never Used  Substance Use Topics  . Alcohol use: Yes    Alcohol/week: 0.6 oz    Types: 1 Cans of beer per week    Comment: Wine every once in a while  . Drug use: No      Review of Systems Per HPI    Objective:   Physical Exam  Constitutional: He appears well-developed and well-nourished.  HENT:  Head: Normocephalic and atraumatic.  Tongue with white coating posterior 2/3.   Eyes: Conjunctivae are normal.  Neck: Neck supple.  Cardiovascular: Normal rate, regular rhythm and normal heart sounds.  Pulmonary/Chest: Effort normal and breath sounds normal.  Musculoskeletal: He exhibits no edema.  Steady, unassisted gait. Mild tenderness over left thoracic paraspinals. UE/LE strength 4/5. UE/LE good ROM.   Neurological: He is alert.  Answers questions appropriately  Skin: Skin is warm and dry.  Psychiatric: He has a normal mood and affect. His behavior is normal. Judgment and thought content normal.  Vitals reviewed.  BP (!) 102/52 (BP Location: Right Arm, Patient Position: Sitting, Cuff Size: Normal)   Pulse 89   Temp 97.8 F (36.6 C) (Oral)   Ht 5\' 7"  (1.702 m)   Wt 135 lb (61.2 kg)   SpO2 94%   BMI 21.14 kg/m  BP Readings from Last 3 Encounters:    05/01/18 (!) 102/52  04/20/18 118/60  04/12/18 (!) 99/57   Wt Readings from Last 3 Encounters:  05/01/18 135 lb (61.2 kg)  04/12/18 133 lb 9.6 oz (60.6 kg)  04/05/18 133 lb (60.3 kg)   Depression screen [facility] 2/9 05/01/2018 05/01/2018 07/12/2017 07/07/2016  Decreased Interest 0 0 0 0  Down, Depressed, Hopeless 1 1 1  0  PHQ - 2 Score 1 1 1  0  Altered sleeping 0 - - -  Tired, decreased energy 1 - - -  Change in appetite 0 - - -  Feeling bad or failure about yourself  3 - - -  Trouble concentrating 0 - - -  Moving slowly or fidgety/restless 0 - - -  PHQ-9 Score 5 - - -  Difficult doing work/chores Somewhat difficult - - -       Assessment & Plan:  1. Dysthymia - citalopram at max recommended dose for age - discussed his chronic health conditions and limitations and things he is able to do. Encouraged him to continue to be as active as able  - spoke with patient's daughter after our visit and natural progression of his chronic conditions and ways to promote safety, adequate caloric intake and socialization.  - follow up in 2 months, sooner if symptoms worsen  2. Chronic left-sided thoracic back pain - intermittent in nature, no worrisome findings on PE or history, encouraged him to try acetaminophen prn  3. Oral thrush - Discussed resuming nystatin  Over 30 minutes were spent face-to-face with the patient during this encounter and >50% of that time was spent on counseling and coordination of care  Clarene Reamer, FNP-BC  [facility], [place]  05/01/2018 9:09 PM

## 2018-05-01 NOTE — Progress Notes (Signed)
I have reviewed this visit and I agree on the patient's plan of dosage and recommendations. Maksymilian Mabey B Neema Fluegge, FNP   

## 2018-05-01 NOTE — Patient Instructions (Signed)
INR today 1.8  Take 1 tablet today (8/5) and then increase weekly dose to taking 1 whole tablet (4mg ) daily EXCEPT for 1/2 pill (2mg ) on Mondays and Fridays only.  Recheck in 2 weeks. \  Son present today with patient and verbalizes understanding of directions.  Copy of written instructions also provided.

## 2018-05-04 ENCOUNTER — Ambulatory Visit: Payer: Medicare Other

## 2018-05-04 DIAGNOSIS — M0579 Rheumatoid arthritis with rheumatoid factor of multiple sites without organ or systems involvement: Secondary | ICD-10-CM | POA: Diagnosis not present

## 2018-05-04 DIAGNOSIS — Z79899 Other long term (current) drug therapy: Secondary | ICD-10-CM | POA: Diagnosis not present

## 2018-05-04 DIAGNOSIS — I482 Chronic atrial fibrillation: Secondary | ICD-10-CM | POA: Diagnosis not present

## 2018-05-04 DIAGNOSIS — C9190 Lymphoid leukemia, unspecified not having achieved remission: Secondary | ICD-10-CM | POA: Diagnosis not present

## 2018-05-05 ENCOUNTER — Ambulatory Visit: Payer: Medicare Other | Admitting: Family Medicine

## 2018-05-09 ENCOUNTER — Ambulatory Visit: Payer: Medicare Other

## 2018-05-16 ENCOUNTER — Ambulatory Visit (INDEPENDENT_AMBULATORY_CARE_PROVIDER_SITE_OTHER): Payer: Medicare Other

## 2018-05-16 DIAGNOSIS — Z7901 Long term (current) use of anticoagulants: Secondary | ICD-10-CM

## 2018-05-16 DIAGNOSIS — I4891 Unspecified atrial fibrillation: Secondary | ICD-10-CM

## 2018-05-16 LAB — POCT INR: INR: 1.5 — AB (ref 2.0–3.0)

## 2018-05-16 NOTE — Patient Instructions (Addendum)
INR today 1.5  Patient and son deny any missed doses; however, I am not sure what is going on with his low levels at this point.  I am hesitant to increase his dose again but both deny any changes to diet, health or medications.  Patient states he thinks he has had some upset stomach this past week.  He does have vision trouble, so I did ask the son to watch for dropped pills and that his dad takes his meds daily.  He states his father often takes his medication early in the am before he gets home from work.  Son and patient both verbalizes that they know which pill is the warfarin (son fills pill box weekly) and they will both pay extra attention to this to ensure he is taking correctly.  Take 1.5 tablets (6mg )today (8/20) and 1.5 tablets (6mg ) tomorrow (8/21) and then continue taking weekly dose to taking 1 whole tablet (4mg ) daily EXCEPT for 1/2 pill (2mg ) on Mondays and Fridays only.  Recheck in 1 week.  Son present today with patient and verbalizes understanding of directions.  Copy of written instructions also provided

## 2018-05-17 NOTE — Progress Notes (Signed)
Agree. Thanks

## 2018-05-23 ENCOUNTER — Ambulatory Visit (INDEPENDENT_AMBULATORY_CARE_PROVIDER_SITE_OTHER): Payer: Medicare Other

## 2018-05-23 DIAGNOSIS — Z7901 Long term (current) use of anticoagulants: Secondary | ICD-10-CM

## 2018-05-23 DIAGNOSIS — I4891 Unspecified atrial fibrillation: Secondary | ICD-10-CM

## 2018-05-23 LAB — POCT INR: INR: 1.8 — AB (ref 2.0–3.0)

## 2018-05-23 NOTE — Patient Instructions (Signed)
INR today 1.8  Take 1.5 tablets (6mg )today (8/27) and then increase weekly dose to taking 1 whole tablet (4mg ) daily EXCEPT for 1/2 pill (2mg ) on Mondays only.  Recheck in 2 weeks.  Son present today with patient and verbalizes understanding of directions.  Copy of written instructions also provided.

## 2018-05-26 DIAGNOSIS — J189 Pneumonia, unspecified organism: Secondary | ICD-10-CM | POA: Diagnosis not present

## 2018-05-30 ENCOUNTER — Other Ambulatory Visit: Payer: Self-pay | Admitting: *Deleted

## 2018-05-30 MED ORDER — CITALOPRAM HYDROBROMIDE 20 MG PO TABS: 20.0000 mg | ORAL_TABLET | Freq: Every day | ORAL | 3 refills | Status: DC

## 2018-06-06 ENCOUNTER — Ambulatory Visit (INDEPENDENT_AMBULATORY_CARE_PROVIDER_SITE_OTHER): Payer: Medicare Other

## 2018-06-06 DIAGNOSIS — I4891 Unspecified atrial fibrillation: Secondary | ICD-10-CM

## 2018-06-06 DIAGNOSIS — Z7901 Long term (current) use of anticoagulants: Secondary | ICD-10-CM | POA: Diagnosis not present

## 2018-06-06 LAB — POCT INR: INR: 2.3 (ref 2.0–3.0)

## 2018-06-06 NOTE — Patient Instructions (Signed)
INR today 2.3  Continue taking 1 whole tablet (4mg ) daily EXCEPT for 1/2 pill (2mg ) on Mondays only.  Recheck in 4 weeks.  Son present today with patient and verbalizes understanding of directions.  Copy of written instructions also provided.

## 2018-06-07 NOTE — Progress Notes (Signed)
Agree. Thanks

## 2018-06-15 ENCOUNTER — Other Ambulatory Visit: Payer: Self-pay | Admitting: Internal Medicine

## 2018-06-19 ENCOUNTER — Telehealth: Payer: Self-pay

## 2018-06-19 NOTE — Telephone Encounter (Signed)
Unable to reach [facility]) for update.

## 2018-06-19 NOTE — Telephone Encounter (Signed)
PLEASE NOTE: All timestamps contained within this report are represented as [place] Standard Time. CONFIDENTIALTY NOTICE: This fax transmission is intended only for the addressee. It contains information that is legally privileged, confidential or otherwise protected from use or disclosure. If you are not the intended recipient, you are strictly prohibited from reviewing, disclosing, copying using or disseminating any of this information or taking any action in reliance on or regarding this information. If you have received this fax in error, please notify us immediately by telephone so that we can arrange for its return to [place]. Phone: [phone], Toll-Free: [phone], Fax: [phone] Page: 1 of 2 Call Id: [id number] [place] Patient Name: Thomas Townsend Gender: Male DOB: 02-Nov-1930 Age: 84 Y 9 M 13 D Return Phone Number: [phone] (Primary), [phone] (Secondary) Address[place] [address] Client [place] [facility] - Client Client Site [place] Physician Tor Netters- NP Contact Type Call Who Is Calling Patient / Member / Family / Caregiver Call Type Triage / Clinical Caller Name [name] Relationship To Patient Daughter Return Phone Number [phone] (Primary) Chief Complaint Weakness, Generalized Reason for Call Symptomatic / Request for Sharon states her father has had diarrhea for 4 days and is weak. Translation No Nurse Assessment Nurse: Ronnald Ramp, RN, Olivia Mackie Date/Time (Eastern Time): 06/18/2018 9:20:41 AM Confirm and document reason for call. If symptomatic, describe symptoms. ---Caller states that her father has diarrhea. It's been going on for 4 days now. He can't make it to the bathroom. Not feverish. He's feeling really weak. He's on palliative care. In and out of  the hospital with pneumonia. Does the patient have any new or worsening symptoms? ---Yes Will a triage be completed? ---Yes Related visit to physician within the last 2 weeks? ---No Does the PT have any chronic conditions? (i.e. diabetes, asthma, this includes High risk factors for pregnancy, etc.) ---Yes List chronic conditions. ---Palliative care, CHF, AFIB, Leukemia (older people, not treating) Is this a behavioral health or substance abuse call? ---No Guidelines Guideline Title Affirmed Question Affirmed Notes Nurse Date/Time (Eastern Time) Diarrhea [1] Drinking very little AND [2] dehydration suspected (e.g., no urine > 12 hours, very dry mouth, very lightheaded) Ronnald Ramp, RN, Olivia Mackie 06/18/2018 9:24:21 AM Disp. Time Eil[place] Ghazi Time) Disposition Final User 06/18/2018 9:28:32 AM Go to ED Now (or PCP triage) Yes Ronnald Ramp, RN, Olivia Mackie PLEASE NOTE: All timestamps contained within this report are represented as [place] Standard Time. CONFIDENTIALTY NOTICE: This fax transmission is intended only for the addressee. It contains information that is legally privileged, confidential or otherwise protected from use or disclosure. If you are not the intended recipient, you are strictly prohibited from reviewing, disclosing, copying using or disseminating any of this information or taking any action in reliance on or regarding this information. If you have received this fax in error, please notify us immediately by telephone so that we can arrange for its return to [place]. Phone: [phone], Toll-Free: [phone], Fax: [phone] Page: 2 of 2 Call Id: [id number] [place] Disagree/Comply Comply Caller Understands Yes PreDisposition Call Doctor Care Advice Given Per Guideline GO TO ED NOW (OR PCP TRIAGE): * IF NO PCP (PRIMARY CARE PROVIDER) SECOND-LEVEL TRIAGE: You need to be seen within the next hour. Go to the [facility] at _____________ [place] as soon as you can. CARE ADVICE given per Diarrhea  (Adult) guideline. DRIVING: Another adult should drive. Comments User:  Gifford Shave, RN Date/Time Eil[place] Ghazi Time): 06/18/2018 9:25:23 AM macular degeneration Referrals GO TO FACILITY UNDECIDED

## 2018-06-20 NOTE — Telephone Encounter (Signed)
Called left message for Thomas Townsend to give the office a return an update on her father. And if he is not felling better schedule an appointment for him to com in and be seen.

## 2018-06-26 DIAGNOSIS — J189 Pneumonia, unspecified organism: Secondary | ICD-10-CM | POA: Diagnosis not present

## 2018-06-27 ENCOUNTER — Other Ambulatory Visit: Payer: Self-pay | Admitting: Internal Medicine

## 2018-06-29 ENCOUNTER — Ambulatory Visit (INDEPENDENT_AMBULATORY_CARE_PROVIDER_SITE_OTHER): Payer: Medicare Other | Admitting: General Practice

## 2018-06-29 DIAGNOSIS — I4891 Unspecified atrial fibrillation: Secondary | ICD-10-CM

## 2018-06-29 DIAGNOSIS — Z7901 Long term (current) use of anticoagulants: Secondary | ICD-10-CM

## 2018-06-29 LAB — POCT INR: INR: 1.6 — AB (ref 2.0–3.0)

## 2018-06-29 NOTE — Patient Instructions (Addendum)
Pre visit review using our clinic review tool, if applicable. No additional management support is needed unless otherwise documented below in the visit note.  Take 1 1/2 tablets today and tomorrow (10/3 and 10/4) and then continue taking 1 whole tablet (4mg ) daily EXCEPT for 1/2 pill (2mg ) on Mondays only.  Recheck in 3 to 4 weeks.  Son present today with patient and verbalizes understanding of directions.  Copy of written instructions also provided.

## 2018-07-07 ENCOUNTER — Ambulatory Visit (INDEPENDENT_AMBULATORY_CARE_PROVIDER_SITE_OTHER): Payer: Medicare Other | Admitting: Family Medicine

## 2018-07-07 ENCOUNTER — Encounter: Payer: Self-pay | Admitting: Family Medicine

## 2018-07-07 VITALS — BP 96/52 | HR 75 | Ht 67.0 in | Wt 131.0 lb

## 2018-07-07 DIAGNOSIS — F341 Dysthymic disorder: Secondary | ICD-10-CM | POA: Diagnosis not present

## 2018-07-07 DIAGNOSIS — I4891 Unspecified atrial fibrillation: Secondary | ICD-10-CM

## 2018-07-07 DIAGNOSIS — H3530 Unspecified macular degeneration: Secondary | ICD-10-CM

## 2018-07-07 NOTE — Patient Instructions (Signed)
Good to see you today  Please follow up in 4 months

## 2018-07-07 NOTE — Progress Notes (Signed)
Subjective:    Patient ID: Thomas Townsend, male    DOB: 11-30-1930, 84 y.o.   MRN: [id number]  HPI This is an 84 yo male who presents today for follow up of dysthymia, CHF, afib. He is accompanied by his son, Thomas Townsend.  He reports he is doing well. He has gotten a new, bigger TV and is waiting for headphones that should enable him to enjoy watching TV more. He has an opthalmology appointment for a second opinion for his declining eyesight.  He denies any pain, falls, dizziness/lightheadedness. Reports that his sleep and appetite are good. He is staying busy.  Both of his parents died at age 81. His mother comes to him in dreams and wakes him. "I know it is a dream."  Had flu shot.   Past Medical History:  Diagnosis Date  . Arthritis   . Atrial fibrillation ([place])   . CHF (congestive heart failure) ([place])   . COPD (chronic obstructive pulmonary disease) ([place])   . Dysrhythmia   . GERD (gastroesophageal reflux disease)   . Hyperlipemia   . Hypertension   . Leukocytosis 01/14/2016  . Prostate cancer ([place])   . Stroke [facility])    Past Surgical History:  Procedure Laterality Date  . CHOLECYSTECTOMY    . COLONOSCOPY WITH PROPOFOL N/A 04/04/2015   Procedure: COLONOSCOPY WITH PROPOFOL;  Surgeon: Manya Silvas, MD;  Location: [facility] ENDOSCOPY;  Service: Endoscopy;  Laterality: N/A;  . ESOPHAGOGASTRODUODENOSCOPY N/A 04/04/2015   Procedure: ESOPHAGOGASTRODUODENOSCOPY (EGD);  Surgeon: Manya Silvas, MD;  Location: [facility] ENDOSCOPY;  Service: Endoscopy;  Laterality: N/A;  . EYE SURGERY     Family History  Problem Relation Age of Onset  . Hypertension Other    Social History   Tobacco Use  . Smoking status: Former Smoker    Years: 20.00    Types: Cigarettes  . Smokeless tobacco: Never Used  Substance Use Topics  . Alcohol use: Yes    Alcohol/week: 1.0 standard drinks    Types: 1 Cans of beer per week    Comment: Wine every once in a while  . Drug use: No      Review of Systems Per  HPI    Objective:   Physical Exam Physical Exam  Constitutional: Oriented to person, place, and time. He appears well-developed and well-nourished.  HENT:  Head: Normocephalic and atraumatic.  Eyes: Conjunctivae are normal.  Neck: Normal range of motion. Neck supple.  Cardiovascular: Normal rate, irregular rhythm and normal heart sounds.   Pulmonary/Chest: Effort normal and breath sounds normal.  Musculoskeletal: No lower extremity edema Neurological: Alert and oriented to person, place, and time.  Skin: Skin is warm and dry.  Psychiatric: Normal mood and affect. Behavior is normal. Judgment and thought content normal.  Vitals reviewed.     BP (!) 96/52 (BP Location: Left Arm, Patient Position: Sitting, Cuff Size: Normal)   Pulse 75   Ht 5\' 7"  (1.702 m)   Wt 131 lb (59.4 kg)   SpO2 99%   BMI 20.52 kg/m  Wt Readings from Last 3 Encounters:  07/07/18 131 lb (59.4 kg)  05/01/18 135 lb (61.2 kg)  04/12/18 133 lb 9.6 oz (60.6 kg)       Assessment & Plan:  1. Dysthymia - mood brighter today - continue current meds  2. Atrial fibrillation, unspecified type ([place]) - reviewed last coumadin visit and follow up is on file  3. Macular degeneration, unspecified laterality, unspecified type - he is getting second  opinion for his vision  - follow up in 4 months, sooner if needed   Clarene Reamer, FNP-BC  [facility], [place]  07/07/2018 10:11 AM

## 2018-07-26 DIAGNOSIS — J189 Pneumonia, unspecified organism: Secondary | ICD-10-CM | POA: Diagnosis not present

## 2018-07-27 ENCOUNTER — Ambulatory Visit (INDEPENDENT_AMBULATORY_CARE_PROVIDER_SITE_OTHER): Payer: Medicare Other | Admitting: General Practice

## 2018-07-27 DIAGNOSIS — Z7901 Long term (current) use of anticoagulants: Secondary | ICD-10-CM

## 2018-07-27 DIAGNOSIS — I4891 Unspecified atrial fibrillation: Secondary | ICD-10-CM

## 2018-07-27 LAB — POCT INR: INR: 2.7 (ref 2.0–3.0)

## 2018-07-27 NOTE — Patient Instructions (Signed)
Pre visit review using our clinic review tool, if applicable. No additional management support is needed unless otherwise documented below in the visit note.  Continue taking 1 whole tablet (4mg) daily EXCEPT for 1/2 pill (2mg) on Mondays only.  Recheck in 3 to 4 weeks.  Son present today with patient and verbalizes understanding of directions.  Copy of written instructions also provided.    

## 2018-08-03 ENCOUNTER — Telehealth: Payer: Self-pay

## 2018-08-03 NOTE — Telephone Encounter (Signed)
Noted. Will continue to monitor electrolytes.

## 2018-08-03 NOTE — Telephone Encounter (Signed)
  [facility] about possible interaction between Digoxin and Lasix. Per TH note Dr Jenny Reichmann advised OK to fill Rx. Copy of TH note in D. Carlean Purl FNP in box.

## 2018-08-07 DIAGNOSIS — H353134 Nonexudative age-related macular degeneration, bilateral, advanced atrophic with subfoveal involvement: Secondary | ICD-10-CM | POA: Diagnosis not present

## 2018-08-07 DIAGNOSIS — H35362 Drusen (degenerative) of macula, left eye: Secondary | ICD-10-CM | POA: Diagnosis not present

## 2018-08-07 DIAGNOSIS — H35033 Hypertensive retinopathy, bilateral: Secondary | ICD-10-CM | POA: Diagnosis not present

## 2018-08-07 DIAGNOSIS — Z961 Presence of intraocular lens: Secondary | ICD-10-CM | POA: Diagnosis not present

## 2018-08-07 DIAGNOSIS — M0579 Rheumatoid arthritis with rheumatoid factor of multiple sites without organ or systems involvement: Secondary | ICD-10-CM | POA: Diagnosis not present

## 2018-08-07 DIAGNOSIS — Z79899 Other long term (current) drug therapy: Secondary | ICD-10-CM | POA: Diagnosis not present

## 2018-08-14 DIAGNOSIS — J449 Chronic obstructive pulmonary disease, unspecified: Secondary | ICD-10-CM | POA: Diagnosis not present

## 2018-08-14 DIAGNOSIS — I10 Essential (primary) hypertension: Secondary | ICD-10-CM | POA: Diagnosis not present

## 2018-08-14 DIAGNOSIS — I4811 Longstanding persistent atrial fibrillation: Secondary | ICD-10-CM | POA: Diagnosis not present

## 2018-08-16 ENCOUNTER — Other Ambulatory Visit: Payer: Self-pay

## 2018-08-16 ENCOUNTER — Inpatient Hospital Stay: Payer: Medicare Other | Attending: Internal Medicine

## 2018-08-16 ENCOUNTER — Encounter: Payer: Self-pay | Admitting: Internal Medicine

## 2018-08-16 ENCOUNTER — Inpatient Hospital Stay (HOSPITAL_BASED_OUTPATIENT_CLINIC_OR_DEPARTMENT_OTHER): Payer: Medicare Other | Admitting: Internal Medicine

## 2018-08-16 VITALS — BP 110/76 | HR 114 | Temp 97.6°F | Resp 18 | Ht 67.0 in | Wt 132.7 lb

## 2018-08-16 DIAGNOSIS — M069 Rheumatoid arthritis, unspecified: Secondary | ICD-10-CM

## 2018-08-16 DIAGNOSIS — R233 Spontaneous ecchymoses: Secondary | ICD-10-CM | POA: Insufficient documentation

## 2018-08-16 DIAGNOSIS — I509 Heart failure, unspecified: Secondary | ICD-10-CM | POA: Insufficient documentation

## 2018-08-16 DIAGNOSIS — Z7901 Long term (current) use of anticoagulants: Secondary | ICD-10-CM

## 2018-08-16 DIAGNOSIS — D471 Chronic myeloproliferative disease: Secondary | ICD-10-CM

## 2018-08-16 DIAGNOSIS — I4891 Unspecified atrial fibrillation: Secondary | ICD-10-CM | POA: Diagnosis not present

## 2018-08-16 DIAGNOSIS — Z87891 Personal history of nicotine dependence: Secondary | ICD-10-CM | POA: Insufficient documentation

## 2018-08-16 DIAGNOSIS — R0602 Shortness of breath: Secondary | ICD-10-CM

## 2018-08-16 DIAGNOSIS — C61 Malignant neoplasm of prostate: Secondary | ICD-10-CM | POA: Diagnosis not present

## 2018-08-16 DIAGNOSIS — R5382 Chronic fatigue, unspecified: Secondary | ICD-10-CM

## 2018-08-16 DIAGNOSIS — D72829 Elevated white blood cell count, unspecified: Secondary | ICD-10-CM

## 2018-08-16 DIAGNOSIS — D649 Anemia, unspecified: Secondary | ICD-10-CM

## 2018-08-16 LAB — FERRITIN: Ferritin: 548 ng/mL — ABNORMAL HIGH (ref 24–336)

## 2018-08-16 LAB — BASIC METABOLIC PANEL
Anion gap: 7 (ref 5–15)
BUN: 25 mg/dL — ABNORMAL HIGH (ref 8–23)
CALCIUM: 8.6 mg/dL — AB (ref 8.9–10.3)
CO2: 24 mmol/L (ref 22–32)
CREATININE: 1.18 mg/dL (ref 0.61–1.24)
Chloride: 102 mmol/L (ref 98–111)
GFR, EST NON AFRICAN AMERICAN: 55 mL/min — AB (ref >60)
Glucose, Bld: 107 mg/dL — ABNORMAL HIGH (ref 70–99)
Potassium: 3.9 mmol/L (ref 3.5–5.1)
SODIUM: 133 mmol/L — AB (ref 135–145)

## 2018-08-16 LAB — CBC WITH DIFFERENTIAL/PLATELET
Abs Immature Granulocytes: 0.21 10*3/uL — ABNORMAL HIGH (ref 0.00–0.07)
BASOS ABS: 0 10*3/uL (ref 0.0–0.1)
BASOS PCT: 0 %
EOS ABS: 0.1 10*3/uL (ref 0.0–0.5)
EOS PCT: 1 %
HEMATOCRIT: 25.7 % — AB (ref 39.0–52.0)
HEMOGLOBIN: 8.1 g/dL — AB (ref 13.0–17.0)
Immature Granulocytes: 2 %
LYMPHS ABS: 1 10*3/uL (ref 0.7–4.0)
Lymphocytes Relative: 7 %
MCH: 32.1 pg (ref 26.0–34.0)
MCHC: 31.5 g/dL (ref 30.0–36.0)
MCV: 102 fL — AB (ref 80.0–100.0)
Monocytes Absolute: 0.6 10*3/uL (ref 0.1–1.0)
Monocytes Relative: 5 %
NEUTROS PCT: 85 %
Neutro Abs: 12.2 10*3/uL — ABNORMAL HIGH (ref 1.7–7.7)
Platelets: 258 10*3/uL (ref 150–400)
RBC: 2.52 MIL/uL — AB (ref 4.22–5.81)
RDW: 17.4 % — ABNORMAL HIGH (ref 11.5–15.5)
WBC: 14.1 10*3/uL — AB (ref 4.0–10.5)
nRBC: 0 % (ref 0.0–0.2)

## 2018-08-16 LAB — IRON AND TIBC
Iron: 99 ug/dL (ref 45–182)
Saturation Ratios: 51 % — ABNORMAL HIGH (ref 17.9–39.5)
TIBC: 193 ug/dL — AB (ref 250–450)
UIBC: 95 ug/dL

## 2018-08-16 LAB — LACTATE DEHYDROGENASE: LDH: 144 U/L (ref 98–192)

## 2018-08-16 LAB — PSA

## 2018-08-16 NOTE — Progress Notes (Signed)
[place] OFFICE PROGRESS NOTE  Patient Care Team: Elby Beck, FNP as PCP - General (Nurse Practitioner) Allyne Gee, MD (Internal Medicine)   SUMMARY OF HEMATOLOGIC/ONCOLOGIC HISTORY:  # Leucocytosis- 20-30K [Dr.Pandit-BMBx 2013-No definitive features of a Myeloproliferative neoplasia including CMML, normal cytogenetics (65 XY. Normocellular to focally mildly hypocellular marrow for age (30-50%) with trilineage hematopoiesis, no overt monocytosis ; Flow study unremarkable. Peripheral blood flow- suggestive of chronic myelomonocytic leukemia]; JAK2/Bcr-Abl-NEG.2013- [place]- No hepato-splenomegaly.   # Prostate cancer [early stage] previous f/u Dr.Tennenbaum [place]; on surveillance.  # IDA- ? Etiology.  # RA on MXT; prednisone 5mg  discon;    INTERVAL HISTORY:  A very pleasant 84 -year-old male patient with a prior history of long-standing leukocytosis/myeloproliferative neoplasm unclassified and mild anemia is here for follow-up.    Patient has not had any hospitalizations.  He is currently at home.   In the interim he had episode of nausea vomiting diarrhea multiple times.  No fevers or chills.  Resolved by itself.  Continues her chronic shortness of breath chronic fatigue.  Chronic easy bruising.  Not any worse.  Denies any bone pain.  Denies any fevers or chills.   Review of Systems  Constitutional: Positive for malaise/fatigue. Negative for chills, diaphoresis, fever and weight loss.  HENT: Negative for nosebleeds and sore throat.   Eyes: Negative for double vision.  Respiratory: Positive for cough and shortness of breath. Negative for hemoptysis, sputum production and wheezing.   Cardiovascular: Negative for chest pain, palpitations, orthopnea and leg swelling.  Gastrointestinal: Negative for abdominal pain, blood in stool, constipation, diarrhea, heartburn, melena, nausea and vomiting.  Genitourinary: Negative for dysuria, frequency and urgency.   Musculoskeletal: Negative for back pain and joint pain.  Skin: Negative.  Negative for itching and rash.  Neurological: Negative for dizziness, tingling, focal weakness, weakness and headaches.  Endo/Heme/Allergies: Bruises/bleeds easily.  Psychiatric/Behavioral: Negative for depression. The patient is not nervous/anxious and does not have insomnia.      PAST MEDICAL HISTORY :  Past Medical History:  Diagnosis Date  . Arthritis   . Atrial fibrillation ([place])   . CHF (congestive heart failure) ([place])   . COPD (chronic obstructive pulmonary disease) ([place])   . Dysrhythmia   . GERD (gastroesophageal reflux disease)   . Hyperlipemia   . Hypertension   . Leukocytosis 01/14/2016  . Prostate cancer ([place])   . Stroke [facility])     PAST SURGICAL HISTORY :   Past Surgical History:  Procedure Laterality Date  . CHOLECYSTECTOMY    . COLONOSCOPY WITH PROPOFOL N/A 04/04/2015   Procedure: COLONOSCOPY WITH PROPOFOL;  Surgeon: Manya Silvas, MD;  Location: [facility] ENDOSCOPY;  Service: Endoscopy;  Laterality: N/A;  . ESOPHAGOGASTRODUODENOSCOPY N/A 04/04/2015   Procedure: ESOPHAGOGASTRODUODENOSCOPY (EGD);  Surgeon: Manya Silvas, MD;  Location: [facility] ENDOSCOPY;  Service: Endoscopy;  Laterality: N/A;  . EYE SURGERY      FAMILY HISTORY :   Family History  Problem Relation Age of Onset  . Hypertension Other     SOCIAL HISTORY:   Social History   Tobacco Use  . Smoking status: Former Smoker    Years: 20.00    Types: Cigarettes  . Smokeless tobacco: Never Used  Substance Use Topics  . Alcohol use: Yes    Alcohol/week: 1.0 standard drinks    Types: 1 Cans of beer per week    Comment: Wine every once in a while  . Drug use: No    ALLERGIES:  has No  Known Allergies.  MEDICATIONS:  Current Outpatient Medications  Medication Sig Dispense Refill  . BREO ELLIPTA 100-25 MCG/INH AEPB Inhale 1 puff into the lungs every morning. 60 each 5  . citalopram (CELEXA) 20 MG tablet TAKE 1 TABLET BY MOUTH  EVERY DAY 90 tablet 0  . digoxin (LANOXIN) 0.125 MG tablet Take 1 tablet (0.125 mg total) every morning by mouth. 90 tablet 1  . ferrous sulfate 325 (65 FE) MG tablet Take 325 mg by mouth every morning. Reported on 9/79/8921    . folic acid (FOLVITE) 1 MG tablet Take 1 mg by mouth daily.    . furosemide (LASIX) 20 MG tablet Take 1 tablet (20 mg total) by mouth every other day. In the morning    . methotrexate (RHEUMATREX) 2.5 MG tablet TAKE 6 TABLETS (15 MG TOTAL) BY MOUTH EVERY 7 (SEVEN) DAYS.  4  . Multiple Vitamins-Minerals (PRESERVISION AREDS PO) Take 1 tablet by mouth 2 (two) times daily.     Marland Kitchen warfarin (COUMADIN) 4 MG tablet Take 2-4 mg by mouth one time only at 6 PM. 2MG -MONDAY,WEDNESDAY,FRIDAY 4MG -Sunday,TUESDAY,THURSDAY,SATURDAY    . montelukast (SINGULAIR) 10 MG tablet Take 10 mg by mouth daily.      No current facility-administered medications for this visit.     PHYSICAL EXAMINATION: ECOG PERFORMANCE STATUS: \  BP 110/76   Pulse (!) 114   Temp 97.6 F (36.4 C) (Oral)   Resp 18   Ht 5\' 7"  (1.702 m)   Wt 132 lb 11.2 oz (60.2 kg)   BMI 20.78 kg/m   Filed Weights   08/16/18 1458  Weight: 132 lb 11.2 oz (60.2 kg)    Physical Exam  Constitutional: He is oriented to person, place, and time and well-developed, well-nourished, and in no distress.  Frail-appearing Caucasian male patient. Accompanied by his daughter.  Walking without any assistive devices.  HENT:  Head: Normocephalic and atraumatic.  Mouth/Throat: Oropharynx is clear and moist. No oropharyngeal exudate.  Eyes: Pupils are equal, round, and reactive to light.  Neck: Normal range of motion. Neck supple.  Cardiovascular: Normal rate and regular rhythm.  Pulmonary/Chest: No respiratory distress. He has no wheezes.  Decreased air entry.  Abdominal: Soft. Bowel sounds are normal. He exhibits no distension and no mass. There is no tenderness. There is no rebound and no guarding.  Musculoskeletal: Normal range of  motion. He exhibits no edema or tenderness.  Neurological: He is alert and oriented to person, place, and time.  Skin: Skin is warm.  Chronic bruises noted.  Psychiatric: Affect normal.    LABORATORY DATA:  I have reviewed the data as listed    Component Value Date/Time   NA 133 (L) 08/16/2018 1424   NA 140 01/22/2015 0345   K 3.9 08/16/2018 1424   K 3.7 01/22/2015 0345   CL 102 08/16/2018 1424   CL 106 01/22/2015 0345   CO2 24 08/16/2018 1424   CO2 25 01/22/2015 0345   GLUCOSE 107 (H) 08/16/2018 1424   GLUCOSE 89 01/22/2015 0345   BUN 25 (H) 08/16/2018 1424   BUN 16 01/22/2015 0345   CREATININE 1.18 08/16/2018 1424   CREATININE 0.88 01/22/2015 0345   CALCIUM 8.6 (L) 08/16/2018 1424   CALCIUM 7.7 (L) 01/22/2015 0345   PROT 7.1 04/12/2018 1455   PROT 6.8 01/21/2015 1946   ALBUMIN 4.0 04/12/2018 1455   ALBUMIN 3.3 (L) 01/21/2015 1946   AST 17 04/12/2018 1455   AST 20 01/21/2015 1946   ALT 9 04/12/2018  1455   ALT 6 (L) 01/21/2015 1946   ALKPHOS 75 04/12/2018 1455   ALKPHOS 86 01/21/2015 1946   BILITOT 0.8 04/12/2018 1455   BILITOT 0.9 01/21/2015 1946   GFRNONAA 55 (L) 08/16/2018 1424   GFRNONAA >60 01/22/2015 0345   GFRAA >60 08/16/2018 1424   GFRAA >60 01/22/2015 0345    No results found for: SPEP, UPEP  Lab Results  Component Value Date   WBC 14.1 (H) 08/16/2018   NEUTROABS 12.2 (H) 08/16/2018   HGB 8.1 (L) 08/16/2018   HCT 25.7 (L) 08/16/2018   MCV 102.0 (H) 08/16/2018   PLT 258 08/16/2018      Chemistry      Component Value Date/Time   NA 133 (L) 08/16/2018 1424   NA 140 01/22/2015 0345   K 3.9 08/16/2018 1424   K 3.7 01/22/2015 0345   CL 102 08/16/2018 1424   CL 106 01/22/2015 0345   CO2 24 08/16/2018 1424   CO2 25 01/22/2015 0345   BUN 25 (H) 08/16/2018 1424   BUN 16 01/22/2015 0345   CREATININE 1.18 08/16/2018 1424   CREATININE 0.88 01/22/2015 0345      Component Value Date/Time   CALCIUM 8.6 (L) 08/16/2018 1424   CALCIUM 7.7 (L)  01/22/2015 0345   ALKPHOS 75 04/12/2018 1455   ALKPHOS 86 01/21/2015 1946   AST 17 04/12/2018 1455   AST 20 01/21/2015 1946   ALT 9 04/12/2018 1455   ALT 6 (L) 01/21/2015 1946   BILITOT 0.8 04/12/2018 1455   BILITOT 0.9 01/21/2015 1946         ASSESSMENT & PLAN:   Myeloproliferative neoplasm (HCC) # Leukocytosis predominant neutrophilia- again unclear etiology- 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  Check iron studies.  Elevated recommend Aranesp.  #Prostate cancer-early stage; currently off therapy/multiple comorbidities clinically stable.  November 2019 PSA less than 0.01.  #A. fib/CHF stable.  On Coumadin.  # nauea/vomitting/diarrhea- ? Viral infection- 1week-   #Intermittent hallucinations- ? Risperdol/defer to PCP.   # we will call re: Iron studies/ferritin- re: PO iron.   # DISPOSITION:  # Follow-up in 4 months with labs-cbc/cmp/ldh-Dr.B  #Addendum iron studies-not suggestive of iron deficiency;    Cammie Sickle, MD 08/18/2018 7:50 AM

## 2018-08-16 NOTE — Assessment & Plan Note (Addendum)
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  Check iron studies.  Elevated recommend Aranesp.  #Prostate cancer-early stage; currently off therapy/multiple comorbidities clinically stable.  November 2019 PSA less than 0.01.  #A. fib/CHF stable.  On Coumadin.  #Nausea/vomitting/diarrhea- ? Viral infection; currently resolved.  #Intermittent hallucinations- ? Risperdol/defer to PCP.   # We will call re: Iron studies/ferritin- re: PO iron.   # DISPOSITION:  # Follow-up in 4 months with labs-cbc/cmp/ldh-Dr.B  # Addendum iron studies-not suggestive of iron deficiency; recommend monitoring hemoglobin for now.  Will repeat in 1 month.  If hemoglobin continues to drop would recommend Aranesp injections to boost the hemoglobin.  Will inform daughter.

## 2018-08-18 ENCOUNTER — Telehealth: Payer: Self-pay | Admitting: Internal Medicine

## 2018-08-18 ENCOUNTER — Ambulatory Visit: Payer: Medicare Other | Admitting: Family Medicine

## 2018-08-18 NOTE — Telephone Encounter (Signed)
#  Heather/Brooke-please inform daughter that iron studies-not suggestive of iron deficiency; continue oral iron currently taking.  # I would recommend monitoring hemoglobin for now.  Will repeat in 1 month.  If hemoglobin continues to drop would recommend Aranesp injections to boost the hemoglobin.    #Please order CBC in 1 month.  Please let me know daughter has questions/ I could talk to her.

## 2018-08-18 NOTE — Telephone Encounter (Signed)
Daughter made aware and new apts given.

## 2018-08-26 DIAGNOSIS — J189 Pneumonia, unspecified organism: Secondary | ICD-10-CM | POA: Diagnosis not present

## 2018-08-29 ENCOUNTER — Telehealth: Payer: Self-pay | Admitting: Internal Medicine

## 2018-08-29 DIAGNOSIS — D471 Chronic myeloproliferative disease: Secondary | ICD-10-CM

## 2018-08-29 NOTE — Telephone Encounter (Signed)
Pt has seen Glenda Chroman FNP since this phone note.

## 2018-08-29 NOTE — Telephone Encounter (Signed)
Also will order erythorpoietin levels.

## 2018-08-30 ENCOUNTER — Other Ambulatory Visit: Payer: Self-pay | Admitting: *Deleted

## 2018-08-30 DIAGNOSIS — C9110 Chronic lymphocytic leukemia of B-cell type not having achieved remission: Secondary | ICD-10-CM

## 2018-08-30 DIAGNOSIS — D638 Anemia in other chronic diseases classified elsewhere: Secondary | ICD-10-CM

## 2018-08-31 ENCOUNTER — Encounter: Payer: Self-pay | Admitting: Family Medicine

## 2018-08-31 ENCOUNTER — Ambulatory Visit (INDEPENDENT_AMBULATORY_CARE_PROVIDER_SITE_OTHER): Payer: Medicare Other | Admitting: Family Medicine

## 2018-08-31 ENCOUNTER — Ambulatory Visit (INDEPENDENT_AMBULATORY_CARE_PROVIDER_SITE_OTHER): Payer: Medicare Other | Admitting: General Practice

## 2018-08-31 VITALS — BP 112/64 | HR 78 | Temp 97.8°F | Ht 67.0 in | Wt 135.5 lb

## 2018-08-31 DIAGNOSIS — I4891 Unspecified atrial fibrillation: Secondary | ICD-10-CM

## 2018-08-31 DIAGNOSIS — Z7901 Long term (current) use of anticoagulants: Secondary | ICD-10-CM | POA: Diagnosis not present

## 2018-08-31 DIAGNOSIS — D471 Chronic myeloproliferative disease: Secondary | ICD-10-CM | POA: Diagnosis not present

## 2018-08-31 DIAGNOSIS — R42 Dizziness and giddiness: Secondary | ICD-10-CM | POA: Diagnosis not present

## 2018-08-31 DIAGNOSIS — M069 Rheumatoid arthritis, unspecified: Secondary | ICD-10-CM | POA: Diagnosis not present

## 2018-08-31 DIAGNOSIS — D638 Anemia in other chronic diseases classified elsewhere: Secondary | ICD-10-CM

## 2018-08-31 LAB — POCT INR: INR: 2.2 (ref 2.0–3.0)

## 2018-08-31 MED ORDER — FLUTICASONE PROPIONATE 50 MCG/ACT NA SUSP: 2.0000 | Freq: Every day | NASAL | 1 refills | Status: DC

## 2018-08-31 NOTE — Patient Instructions (Addendum)
Dizziness may be from worsening anemia or may be from sinus congestion.  Keep blood work appointment with blood doctor later this month.  May try nasal saline irrigation throughout the day, try flonase nasal steroid (flonase) in each nose daily for 1 week to see if any improvement. If nosebleeds, stop nasal steroid (flonase)

## 2018-08-31 NOTE — Assessment & Plan Note (Addendum)
Negative orthostatic vital signs. Overall benign neurological exam today. Anticipate combination of progressive chronic anemia and sinus congestion. Encouraged f/u with heme for labs later this month - considering aranesp. Will trial nasal saline irrigation and flonase nasal steroid spray - discussed monitoring for epistaxis with coumadin use.

## 2018-08-31 NOTE — Progress Notes (Signed)
BP 112/64 (BP Location: Right Arm, Patient Position: Sitting, Cuff Size: Normal)   Pulse 78   Temp 97.8 F (36.6 C) (Oral)   Ht 5\' 7"  (1.702 m)   Wt 135 lb 8 oz (61.5 kg)   SpO2 99%   BMI 21.22 kg/m   Orthostatic VS for the past 24 hrs (Last 3 readings):  BP- Lying BP- Standing at 0 minutes  08/31/18 0929 - 106/60  08/31/18 0926 110/64 -    CC: dizziness Subjective:    Patient ID: Thomas Townsend, male    DOB: 1930/12/04, 84 y.o.   MRN: [id number]  HPI: Thomas Townsend is a 84 y.o. male presenting on 08/31/2018 for Dizziness (Per pt's son, [name]- here with him today, pt has had some dizziness last week since being sick with head congestion. )   "I'm feeling good, no problems". Here with son [name]. Hard of hearing, not using hearing aides.   Occasional dizziness, worse with prolonged car rides. No presyncope, LOC, unsteadiness. Describes dizziness as vertigo and lightheadedness. No orthostatic dizziness. Mild congestion and rhinorrhea for months. No fevers, cough or watery eyes. He was ill last month.   On coumadin (h/o afib) and methotrexate (rheumatoid arthritis).  H/o anemia from myeloproliferative disorder ?chronic myelomonocytic leukemia.  Lab Results  Component Value Date   INR 2.2 08/31/2018   INR 2.7 07/27/2018   INR 1.6 (A) 06/29/2018     Relevant past medical, surgical, family and social history reviewed and updated as indicated. Interim medical history since our last visit reviewed. Allergies and medications reviewed and updated. Outpatient Medications Prior to Visit  Medication Sig Dispense Refill  . BREO ELLIPTA 100-25 MCG/INH AEPB Inhale 1 puff into the lungs every morning. 60 each 5  . citalopram (CELEXA) 20 MG tablet TAKE 1 TABLET BY MOUTH EVERY DAY 90 tablet 0  . digoxin (LANOXIN) 0.125 MG tablet Take 1 tablet (0.125 mg total) every morning by mouth. 90 tablet 1  . ferrous sulfate 325 (65 FE) MG tablet Take 325 mg by mouth every morning. Reported on 03/23/349     . folic acid (FOLVITE) 1 MG tablet Take 1 mg by mouth daily.    . methotrexate (RHEUMATREX) 2.5 MG tablet TAKE 6 TABLETS (15 MG TOTAL) BY MOUTH EVERY 7 (SEVEN) DAYS.  4  . montelukast (SINGULAIR) 10 MG tablet Take 10 mg by mouth daily.     . Multiple Vitamins-Minerals (PRESERVISION AREDS PO) Take 1 tablet by mouth 2 (two) times daily.     Marland Kitchen warfarin (COUMADIN) 4 MG tablet Take 2-4 mg by mouth one time only at 6 PM. 2MG -MONDAY,WEDNESDAY,FRIDAY 4MG -Sunday,TUESDAY,THURSDAY,SATURDAY    . furosemide (LASIX) 20 MG tablet Take 1 tablet (20 mg total) by mouth every other day. In the morning (Patient not taking: Reported on 08/31/2018)     No facility-administered medications prior to visit.      Per HPI unless specifically indicated in ROS section below Review of Systems     Objective:    BP 112/64 (BP Location: Right Arm, Patient Position: Sitting, Cuff Size: Normal)   Pulse 78   Temp 97.8 F (36.6 C) (Oral)   Ht 5\' 7"  (1.702 m)   Wt 135 lb 8 oz (61.5 kg)   SpO2 99%   BMI 21.22 kg/m   Wt Readings from Last 3 Encounters:  08/31/18 135 lb 8 oz (61.5 kg)  08/16/18 132 lb 11.2 oz (60.2 kg)  07/07/18 131 lb (59.4 kg)    Physical Exam  Constitutional: He appears well-developed and well-nourished. No distress.  HENT:  Head: Normocephalic and atraumatic.  Right Ear: Tympanic membrane, external ear and ear canal normal. Decreased hearing is noted.  Left Ear: Tympanic membrane, external ear and ear canal normal. Decreased hearing is noted.  Nose: No mucosal edema or rhinorrhea (mild). Right sinus exhibits no maxillary sinus tenderness and no frontal sinus tenderness. Left sinus exhibits maxillary sinus tenderness (he hit L cheek after fall recently). Left sinus exhibits no frontal sinus tenderness.  Mouth/Throat: Uvula is midline, oropharynx is clear and moist and mucous membranes are normal. No oropharyngeal exudate, posterior oropharyngeal edema, posterior oropharyngeal erythema or  tonsillar abscesses.  Pale nasal mucosa Does not wear hearing aides today  Eyes: Pupils are equal, round, and reactive to light. Conjunctivae and EOM are normal. No scleral icterus.  Subconjunctival pallor  Neck: Normal range of motion. Neck supple.  Cardiovascular: Normal rate, regular rhythm, normal heart sounds and intact distal pulses.  No murmur heard. Pulmonary/Chest: Effort normal and breath sounds normal. No respiratory distress. He has no wheezes. He has no rales.  Lymphadenopathy:    He has no cervical adenopathy.  Neurological: He is alert. He has normal strength. No cranial nerve deficit or sensory deficit. Coordination and gait normal.  CN 2-12 intact EOMI  Able to get on exam table easily on his own without unsteadiness  Skin: Skin is warm and dry. No rash noted. There is pallor.  Nursing note and vitals reviewed.  Results for orders placed or performed in visit on 08/16/18  PSA  Result Value Ref Range   Prostatic Specific Antigen <0.01 0.00 - 4.00 ng/mL  Lactate dehydrogenase  Result Value Ref Range   LDH 144 98 - 192 U/L  Ferritin  Result Value Ref Range   Ferritin 548 (H) 24 - 336 ng/mL  Iron and TIBC  Result Value Ref Range   Iron 99 45 - 182 ug/dL   TIBC 193 (L) 250 - 450 ug/dL   Saturation Ratios 51 (H) 17.9 - 39.5 %   UIBC 95 ug/dL  Basic metabolic panel  Result Value Ref Range   Sodium 133 (L) 135 - 145 mmol/L   Potassium 3.9 3.5 - 5.1 mmol/L   Chloride 102 98 - 111 mmol/L   CO2 24 22 - 32 mmol/L   Glucose, Bld 107 (H) 70 - 99 mg/dL   BUN 25 (H) 8 - 23 mg/dL   Creatinine, Ser 1.18 0.61 - 1.24 mg/dL   Calcium 8.6 (L) 8.9 - 10.3 mg/dL   GFR calc non Af Amer 55 (L) >60 mL/min   GFR calc Af Amer >60 >60 mL/min   Anion gap 7 5 - 15  CBC with Differential/Platelet  Result Value Ref Range   WBC 14.1 (H) 4.0 - 10.5 K/uL   RBC 2.52 (L) 4.22 - 5.81 MIL/uL   Hemoglobin 8.1 (L) 13.0 - 17.0 g/dL   HCT 25.7 (L) 39.0 - 52.0 %   MCV 102.0 (H) 80.0 - 100.0 fL    MCH 32.1 26.0 - 34.0 pg   MCHC 31.5 30.0 - 36.0 g/dL   RDW 17.4 (H) 11.5 - 15.5 %   Platelets 258 150 - 400 K/uL   nRBC 0.0 0.0 - 0.2 %   Neutrophils Relative % 85 %   Neutro Abs 12.2 (H) 1.7 - 7.7 K/uL   Lymphocytes Relative 7 %   Lymphs Abs 1.0 0.7 - 4.0 K/uL   Monocytes Relative 5 %   Monocytes Absolute  0.6 0.1 - 1.0 K/uL   Eosinophils Relative 1 %   Eosinophils Absolute 0.1 0.0 - 0.5 K/uL   Basophils Relative 0 %   Basophils Absolute 0.0 0.0 - 0.1 K/uL   Immature Granulocytes 2 %   Abs Immature Granulocytes 0.21 (H) 0.00 - 0.07 K/uL      Assessment & Plan:   Problem List Items Addressed This Visit    Rheumatoid arthritis involving multiple joints ([place])   Myeloproliferative neoplasm ([place])   Long term (current) use of anticoagulants   Dizziness - Primary    Negative orthostatic vital signs. Overall benign neurological exam today. Anticipate combination of progressive chronic anemia and sinus congestion. Encouraged f/u with heme for labs later this month - considering aranesp. Will trial nasal saline irrigation and flonase nasal steroid spray - discussed monitoring for epistaxis with coumadin use.      Atrial fibrillation with rapid ventricular response (HCC)   Anemia in other chronic diseases classified elsewhere       Meds ordered this encounter  Medications  . fluticasone (FLONASE) 50 MCG/ACT nasal spray    Sig: Place 2 sprays into both nostrils daily.    Dispense:  16 g    Refill:  1   No orders of the defined types were placed in this encounter.   Follow up plan: No follow-ups on file.  Ria Bush, MD

## 2018-08-31 NOTE — Patient Instructions (Addendum)
Pre visit review using our clinic review tool, if applicable. No additional management support is needed unless otherwise documented below in the visit note.  Continue taking 1 whole tablet (4mg ) daily EXCEPT for 1/2 pill (2mg ) on Mondays only.  Recheck in 3 to 4 weeks.  Son present today with patient and verbalizes understanding of directions.  Copy of written instructions also provided.

## 2018-09-01 ENCOUNTER — Other Ambulatory Visit: Payer: Self-pay | Admitting: Family Medicine

## 2018-09-05 ENCOUNTER — Other Ambulatory Visit: Payer: Self-pay | Admitting: Family Medicine

## 2018-09-05 NOTE — Telephone Encounter (Signed)
Spoke with [facility] relaying refill info for Flonase.  Says they will get it filled for pt.

## 2018-09-05 NOTE — Telephone Encounter (Signed)
plz phone in. Received E prescribe error.

## 2018-09-14 ENCOUNTER — Other Ambulatory Visit: Payer: Medicare Other

## 2018-09-18 ENCOUNTER — Inpatient Hospital Stay: Payer: Medicare Other | Attending: Internal Medicine

## 2018-09-18 DIAGNOSIS — C946 Myelodysplastic disease, not classified: Secondary | ICD-10-CM | POA: Diagnosis not present

## 2018-09-18 DIAGNOSIS — C9110 Chronic lymphocytic leukemia of B-cell type not having achieved remission: Secondary | ICD-10-CM

## 2018-09-18 DIAGNOSIS — D471 Chronic myeloproliferative disease: Secondary | ICD-10-CM

## 2018-09-18 DIAGNOSIS — D638 Anemia in other chronic diseases classified elsewhere: Secondary | ICD-10-CM

## 2018-09-18 LAB — CBC WITH DIFFERENTIAL/PLATELET
Abs Immature Granulocytes: 0.15 10*3/uL — ABNORMAL HIGH (ref 0.00–0.07)
Basophils Absolute: 0.1 10*3/uL (ref 0.0–0.1)
Basophils Relative: 0 %
Eosinophils Absolute: 0.3 10*3/uL (ref 0.0–0.5)
Eosinophils Relative: 2 %
HEMATOCRIT: 28.7 % — AB (ref 39.0–52.0)
Hemoglobin: 8.8 g/dL — ABNORMAL LOW (ref 13.0–17.0)
Immature Granulocytes: 1 %
LYMPHS ABS: 0.9 10*3/uL (ref 0.7–4.0)
LYMPHS PCT: 7 %
MCH: 31.2 pg (ref 26.0–34.0)
MCHC: 30.7 g/dL (ref 30.0–36.0)
MCV: 101.8 fL — ABNORMAL HIGH (ref 80.0–100.0)
Monocytes Absolute: 2.8 10*3/uL — ABNORMAL HIGH (ref 0.1–1.0)
Monocytes Relative: 22 %
Neutro Abs: 8.9 10*3/uL — ABNORMAL HIGH (ref 1.7–7.7)
Neutrophils Relative %: 68 %
Platelets: 191 10*3/uL (ref 150–400)
RBC: 2.82 MIL/uL — ABNORMAL LOW (ref 4.22–5.81)
RDW: 18.2 % — ABNORMAL HIGH (ref 11.5–15.5)
WBC: 13.2 10*3/uL — ABNORMAL HIGH (ref 4.0–10.5)
nRBC: 0 % (ref 0.0–0.2)

## 2018-09-19 ENCOUNTER — Telehealth: Payer: Self-pay | Admitting: Internal Medicine

## 2018-09-19 ENCOUNTER — Other Ambulatory Visit: Payer: Self-pay | Admitting: *Deleted

## 2018-09-19 ENCOUNTER — Other Ambulatory Visit: Payer: Self-pay | Admitting: Internal Medicine

## 2018-09-19 DIAGNOSIS — C9110 Chronic lymphocytic leukemia of B-cell type not having achieved remission: Secondary | ICD-10-CM

## 2018-09-19 LAB — ERYTHROPOIETIN: Erythropoietin: 32.3 m[IU]/mL — ABNORMAL HIGH (ref 2.6–18.5)

## 2018-09-19 NOTE — Telephone Encounter (Signed)
Thomas Townsend/Thomas Townsend -please inform family/patient that his hemoglobin continues to be low at 8.8/overall stable.  However I would recommend Aranesp to boost his blood counts/hemoglobin-this might give him more energy.  This was previously discussed at the last visit.  If interested please schedule Aranesp every 2 weeks; labs- H&H every 2 weeks; follow up with me/labs-H&H/aranesp in 6 weeks-Dr.B.

## 2018-09-19 NOTE — Telephone Encounter (Signed)
Colette, please schedule Thomas Townsend- starting next week for Aranesp and labs. Lab/aranesp every 2 weeks-starting next week See Dr. Rogue Bussing in approx. 6 weeks with lab/md/aranesp  I left vm for daughter with plan of care.

## 2018-09-19 NOTE — Progress Notes (Signed)
aranesp ordered.

## 2018-09-22 ENCOUNTER — Other Ambulatory Visit: Payer: Self-pay | Admitting: Nurse Practitioner

## 2018-09-22 ENCOUNTER — Encounter: Payer: Self-pay | Admitting: Internal Medicine

## 2018-09-25 ENCOUNTER — Inpatient Hospital Stay: Payer: Medicare Other

## 2018-09-25 ENCOUNTER — Other Ambulatory Visit: Payer: Self-pay | Admitting: Internal Medicine

## 2018-09-25 VITALS — BP 101/65 | HR 79

## 2018-09-25 DIAGNOSIS — J189 Pneumonia, unspecified organism: Secondary | ICD-10-CM | POA: Diagnosis not present

## 2018-09-25 DIAGNOSIS — D4620 Refractory anemia with excess of blasts, unspecified: Secondary | ICD-10-CM | POA: Insufficient documentation

## 2018-09-25 DIAGNOSIS — C9110 Chronic lymphocytic leukemia of B-cell type not having achieved remission: Secondary | ICD-10-CM

## 2018-09-25 DIAGNOSIS — C946 Myelodysplastic disease, not classified: Secondary | ICD-10-CM | POA: Diagnosis not present

## 2018-09-25 LAB — HEMATOCRIT: HCT: 27.2 % — ABNORMAL LOW (ref 39.0–52.0)

## 2018-09-25 LAB — HEMOGLOBIN: Hemoglobin: 8.5 g/dL — ABNORMAL LOW (ref 13.0–17.0)

## 2018-09-25 MED ORDER — EPOETIN ALFA-EPBX 40000 UNIT/ML IJ SOLN
40000.0000 [IU] | Freq: Once | INTRAMUSCULAR | Status: AC
  Administered 2018-09-25: 40000 [IU] via SUBCUTANEOUS
  Filled 2018-09-25: qty 1

## 2018-09-28 ENCOUNTER — Ambulatory Visit (INDEPENDENT_AMBULATORY_CARE_PROVIDER_SITE_OTHER): Payer: Medicare Other | Admitting: General Practice

## 2018-09-28 DIAGNOSIS — Z7901 Long term (current) use of anticoagulants: Secondary | ICD-10-CM | POA: Diagnosis not present

## 2018-09-28 DIAGNOSIS — I4891 Unspecified atrial fibrillation: Secondary | ICD-10-CM

## 2018-09-28 LAB — POCT INR: INR: 1.6 — AB (ref 2.0–3.0)

## 2018-09-28 NOTE — Patient Instructions (Addendum)
Pre visit review using our clinic review tool, if applicable. No additional management support is needed unless otherwise documented below in the visit note.  Take extra 1/2 tablet today and tomorrow and then continue taking 1 whole tablet (4mg ) daily EXCEPT for 1/2 pill (2mg ) on Mondays only.  Recheck in 3 to 4 weeks. Patient's son is with him today and verbalizes understanding of dosing instructions.

## 2018-10-01 ENCOUNTER — Other Ambulatory Visit: Payer: Self-pay | Admitting: Internal Medicine

## 2018-10-04 IMAGING — CR DG CHEST 2V
1 series · 2 of 2 positions shown · non-contrast
Comparison: PA and lateral chest x-ray and chest CT scan of
June 24, 2016

CLINICAL DATA: Episode of pneumonia 2 weeks ago. Follow-up study.
No current complaints. History of COPD, CHF, prostate malignancy.

EXAM:
CHEST  2 VIEW

[Series 1: dg chest 2 view · 0.14mm/px · 2 of 2 slices shown]
[im 1/2]
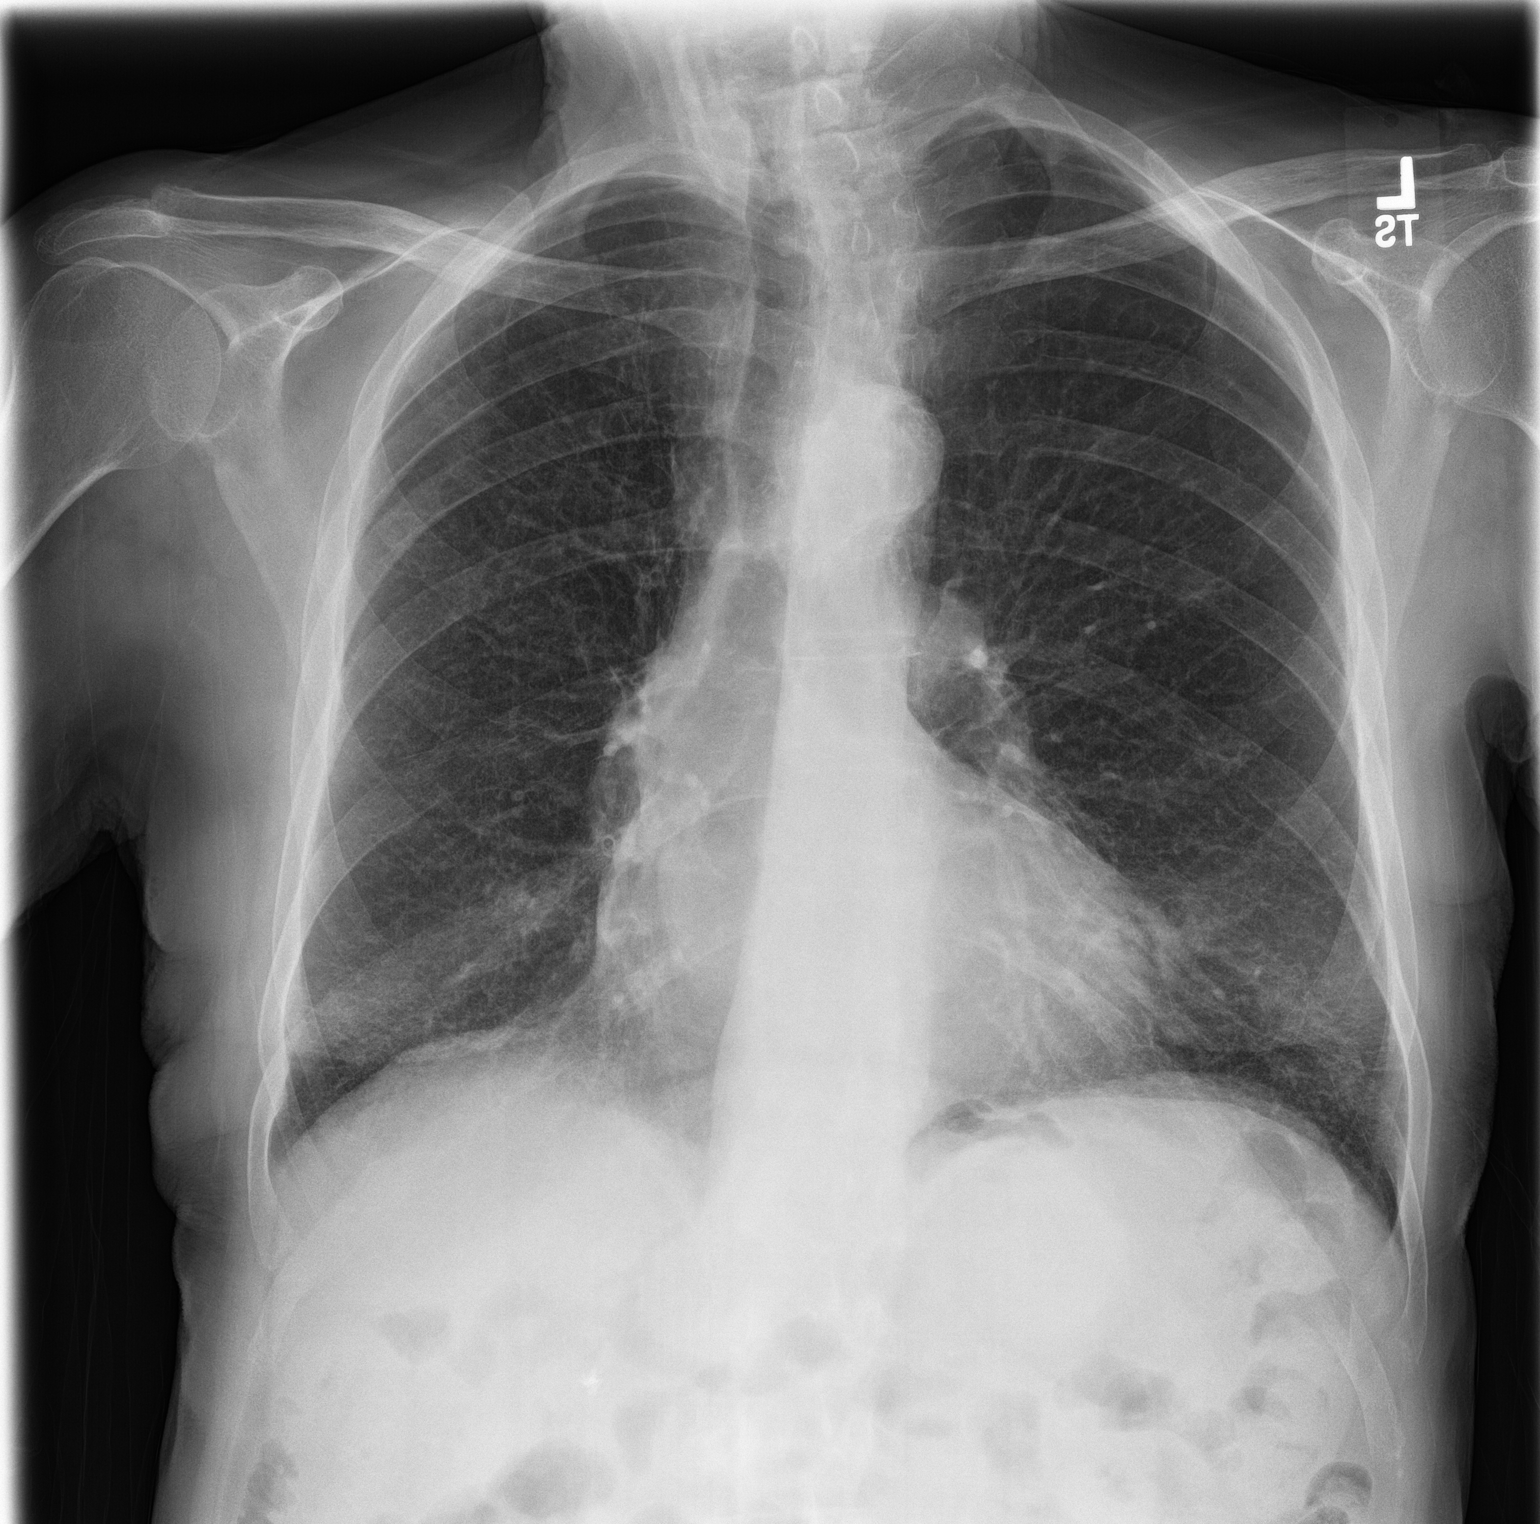
[im 2/2]
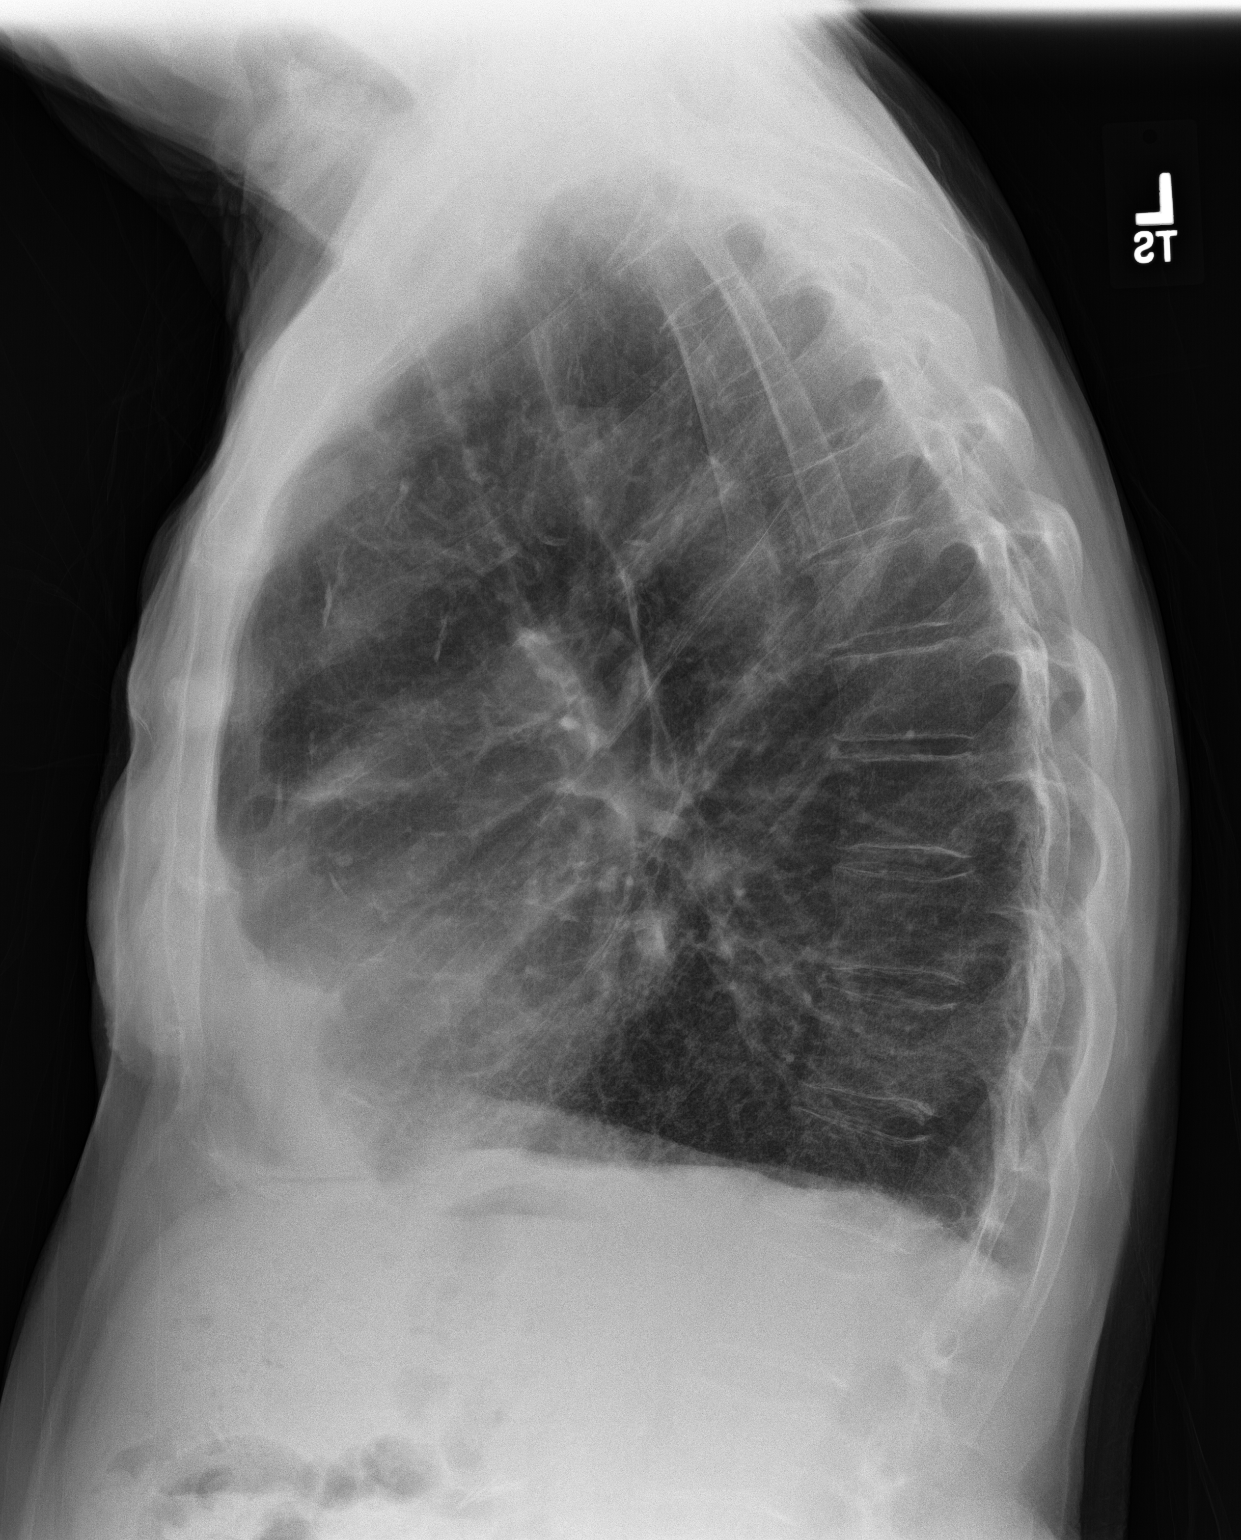

[2 of 2 positions shown; findings below may reference images not displayed]

FINDINGS: The lungs remain mildly hyperinflated with hemidiaphragm flattening.
The interstitial markings have improved throughout both lungs. No
air bronchograms are observed. There remains density in the
retrosternal region that may reflect a confluence of shadows but
residual infiltrate, pleural thickening, or scarring could produce
similar findings. Soft tissue fullness in the paratracheal regions
is less conspicuous on this study likely due to less lordosis. The
heart and pulmonary vascularity are normal. The mediastinum is
normal in width. There is no pleural effusion.
IMPRESSION: COPD. Improvement in the interstitium diffusely. Subtle increased
density in the retrosternal region merits an additional follow-up
radiograph. No CHF.

A follow-up chest x-ray in an additional 2-3 weeks is recommended to
assure complete clearing.

## 2018-10-09 ENCOUNTER — Encounter (INDEPENDENT_AMBULATORY_CARE_PROVIDER_SITE_OTHER): Payer: Self-pay

## 2018-10-09 ENCOUNTER — Inpatient Hospital Stay: Payer: Medicare Other | Attending: Internal Medicine

## 2018-10-09 ENCOUNTER — Inpatient Hospital Stay: Payer: Medicare Other

## 2018-10-09 VITALS — BP 114/72 | HR 82

## 2018-10-09 DIAGNOSIS — D4620 Refractory anemia with excess of blasts, unspecified: Secondary | ICD-10-CM

## 2018-10-09 DIAGNOSIS — E860 Dehydration: Secondary | ICD-10-CM | POA: Diagnosis not present

## 2018-10-09 DIAGNOSIS — D649 Anemia, unspecified: Secondary | ICD-10-CM | POA: Diagnosis not present

## 2018-10-09 DIAGNOSIS — D638 Anemia in other chronic diseases classified elsewhere: Secondary | ICD-10-CM | POA: Insufficient documentation

## 2018-10-09 DIAGNOSIS — C946 Myelodysplastic disease, not classified: Secondary | ICD-10-CM | POA: Insufficient documentation

## 2018-10-09 DIAGNOSIS — C61 Malignant neoplasm of prostate: Secondary | ICD-10-CM | POA: Diagnosis not present

## 2018-10-09 DIAGNOSIS — D72829 Elevated white blood cell count, unspecified: Secondary | ICD-10-CM | POA: Diagnosis not present

## 2018-10-09 DIAGNOSIS — Z87891 Personal history of nicotine dependence: Secondary | ICD-10-CM | POA: Insufficient documentation

## 2018-10-09 DIAGNOSIS — C9110 Chronic lymphocytic leukemia of B-cell type not having achieved remission: Secondary | ICD-10-CM

## 2018-10-09 DIAGNOSIS — R410 Disorientation, unspecified: Secondary | ICD-10-CM | POA: Insufficient documentation

## 2018-10-09 LAB — HEMOGLOBIN: Hemoglobin: 9.6 g/dL — ABNORMAL LOW (ref 13.0–17.0)

## 2018-10-09 LAB — HEMATOCRIT: HCT: 31.4 % — ABNORMAL LOW (ref 39.0–52.0)

## 2018-10-09 MED ORDER — EPOETIN ALFA-EPBX 40000 UNIT/ML IJ SOLN
40000.0000 [IU] | Freq: Once | INTRAMUSCULAR | Status: AC
  Administered 2018-10-09: 40000 [IU] via SUBCUTANEOUS
  Filled 2018-10-09: qty 1

## 2018-10-10 IMAGING — CR DG CHEST 2V
1 series · 2 of 2 positions shown · non-contrast
Comparison: 07/07/2016

CLINICAL DATA: Pneumonia 3 weeks ago, followup, history COPD, CHF,
hypertension, atrial fibrillation, prostate cancer, former smoker

EXAM:
CHEST  2 VIEW

[Series 1: dg chest 2 view · 0.14mm/px · 2 of 2 slices shown]
[im 1/2]
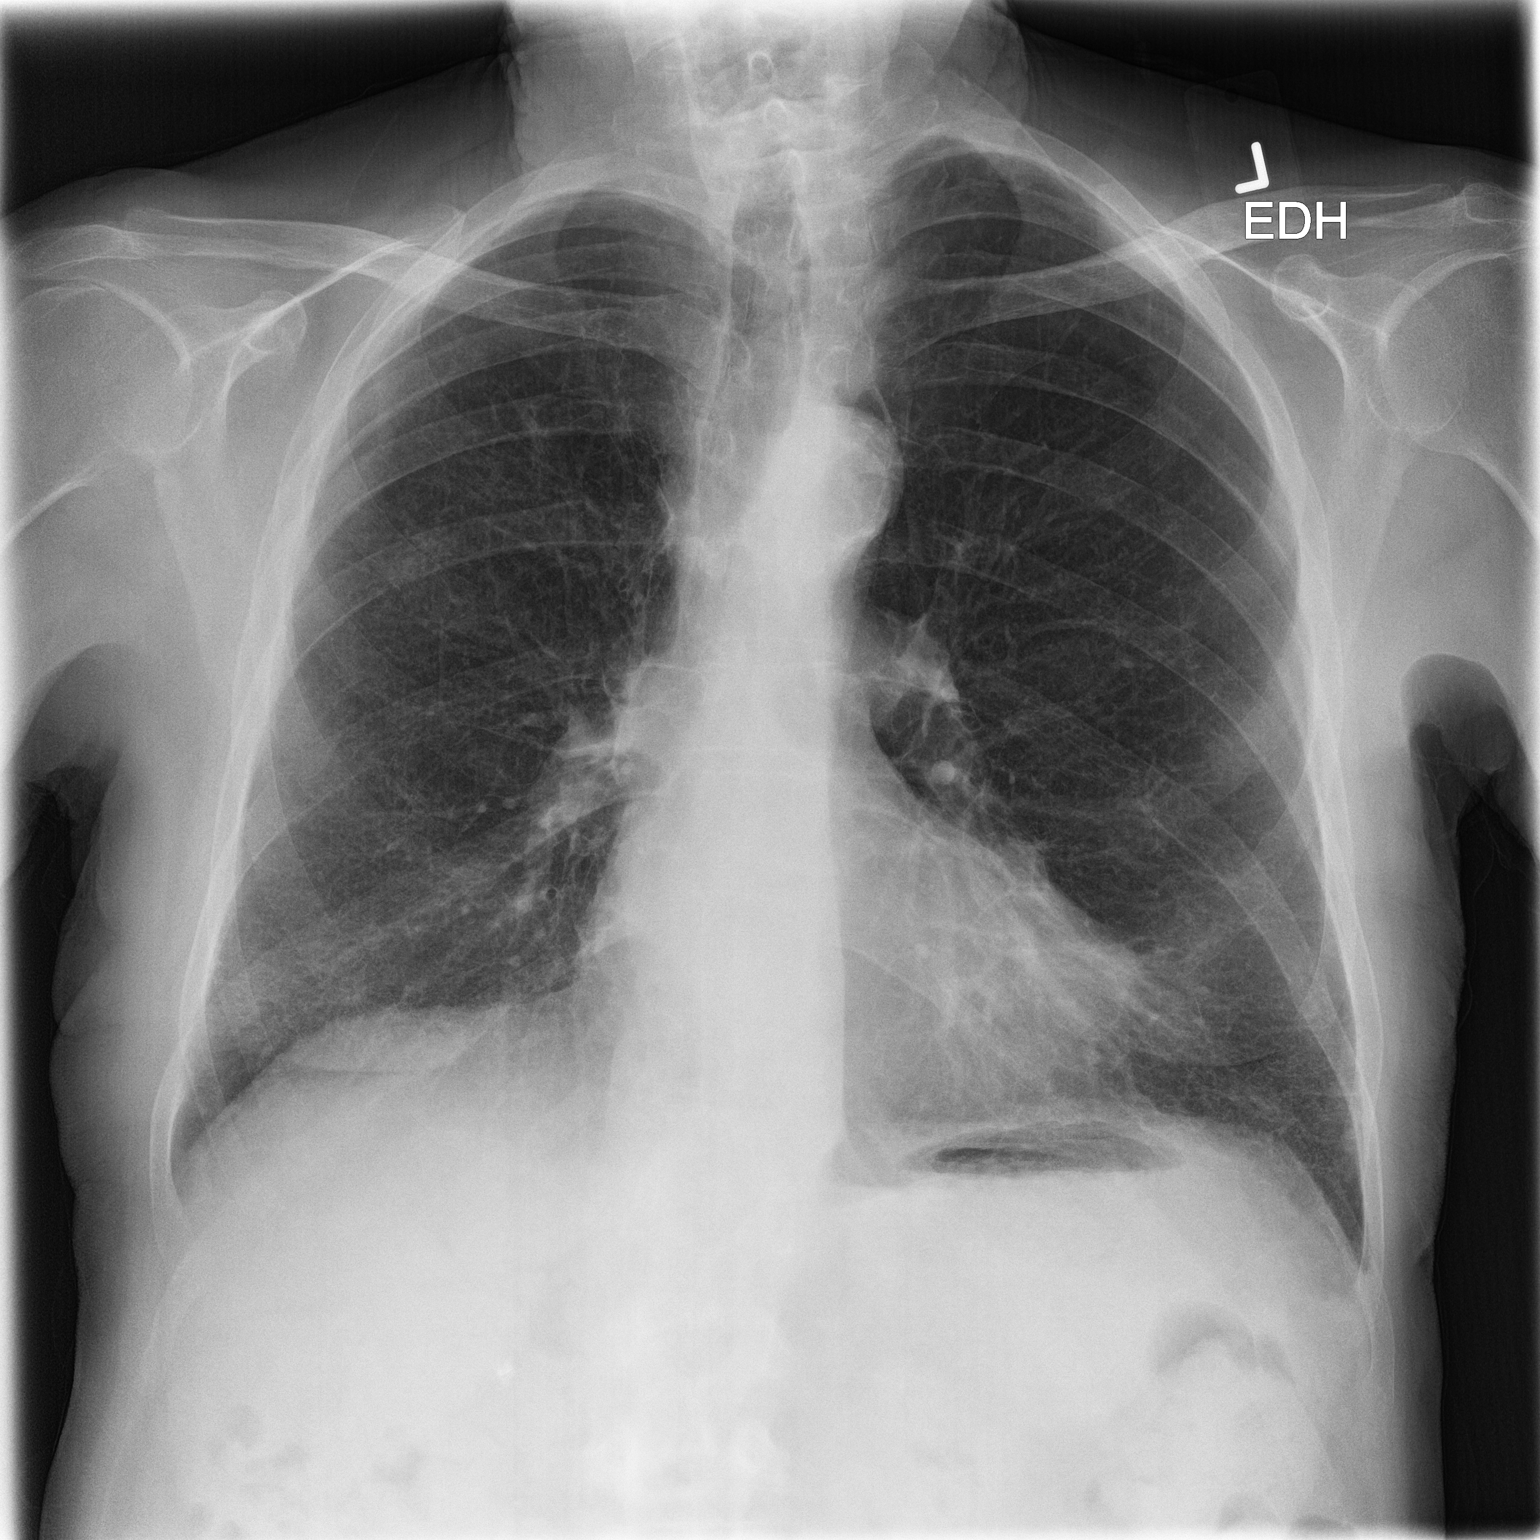
[im 2/2]
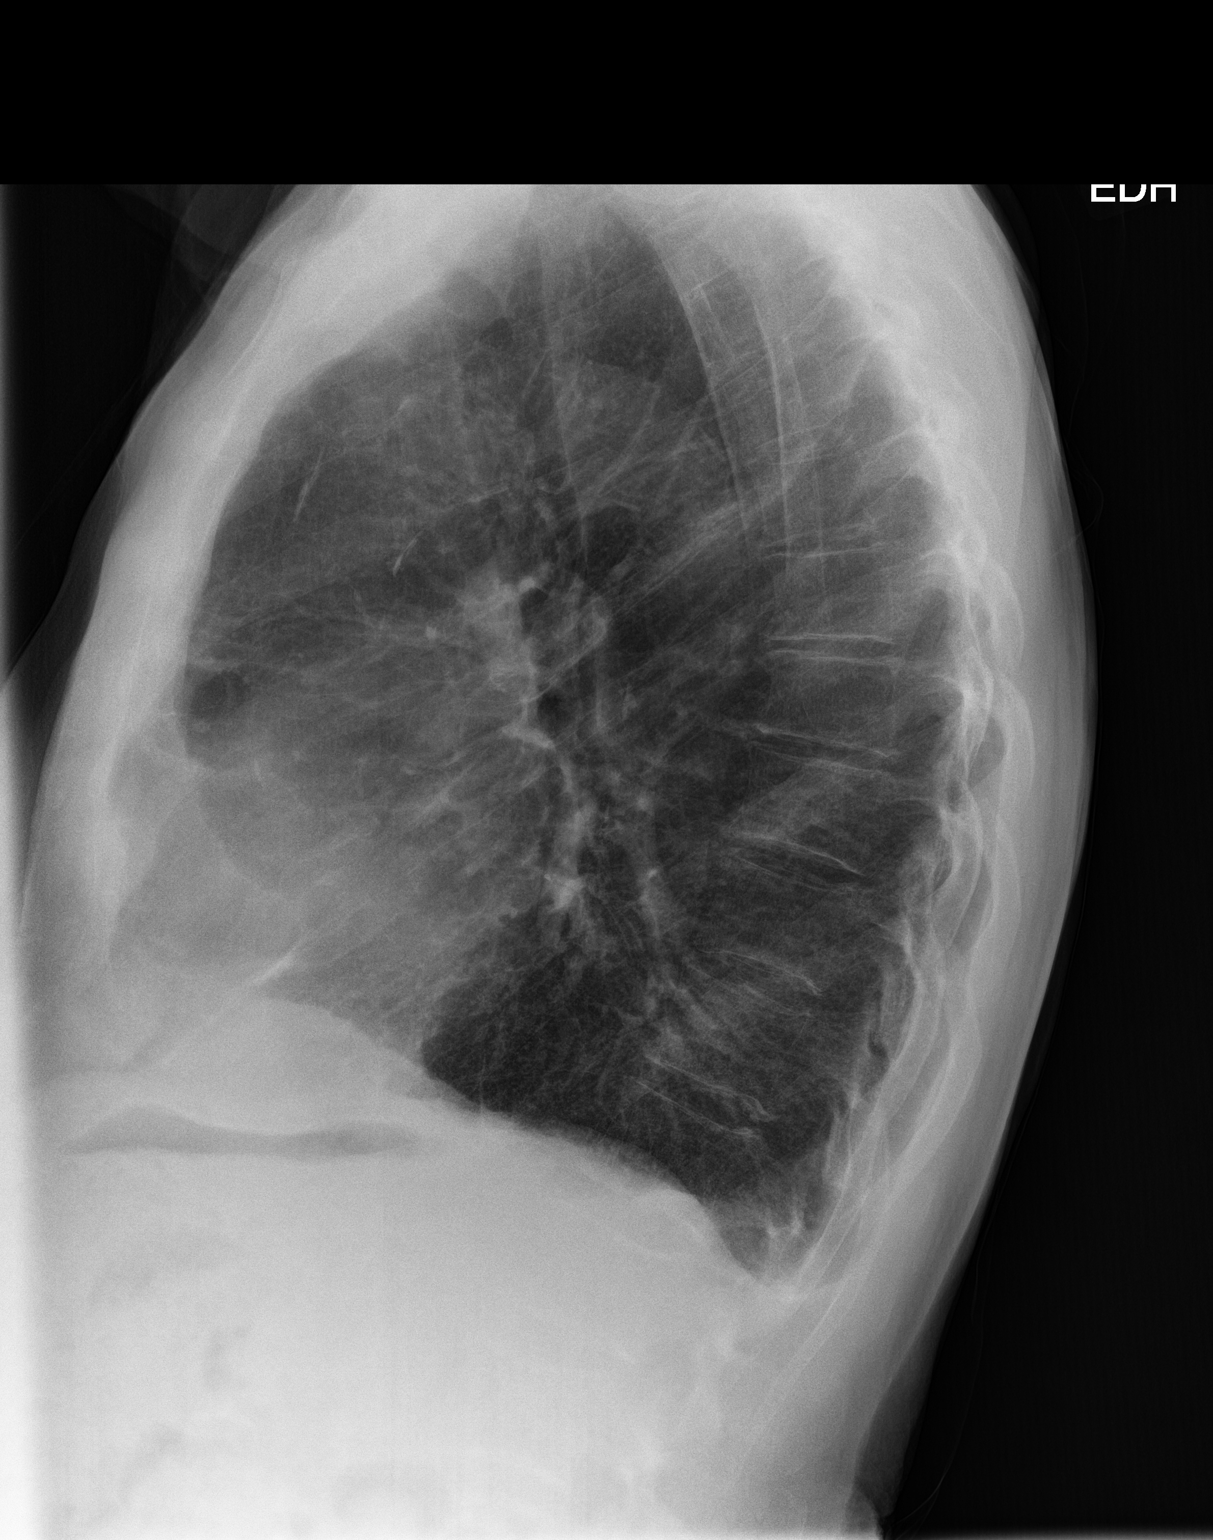

[2 of 2 positions shown; findings below may reference images not displayed]

FINDINGS: Normal heart size, mediastinal contours, and pulmonary vascularity.

Atherosclerotic calcification aorta.

Emphysematous and bronchitic changes consistent with history of
COPD.

Chronic accentuation of basilar interstitial markings unchanged.

Minimal residual atelectasis anterior lung bases on lateral view.

No acute infiltrate, pleural effusion or pneumothorax.

Bones demineralized.
IMPRESSION: COPD changes with minimal residual anterior basilar atelectasis.

No acute infiltrate.

## 2018-10-19 ENCOUNTER — Other Ambulatory Visit: Payer: Self-pay | Admitting: Family Medicine

## 2018-10-19 DIAGNOSIS — I5032 Chronic diastolic (congestive) heart failure: Secondary | ICD-10-CM

## 2018-10-19 DIAGNOSIS — R600 Localized edema: Secondary | ICD-10-CM

## 2018-10-20 NOTE — Telephone Encounter (Signed)
Received refill request for furosemide 20 mg; per med list on 08/31/18 noted pt is not taking furosemide. I spoke with [name] and she said pt was taking the furosemide but he was out of med. Pt has swelling in ankles on and off; swelling goes down overnight. Thomas Townsend request refill done on furosemide. On med list Dr Benjie Karvonen  was last to prescribe med; Thomas Townsend said that was doctor who treated pt while in hospital.Please advise.

## 2018-10-20 NOTE — Telephone Encounter (Signed)
Noted.  Reviewed PCPs office visit from summer 2019 where furosemide was cut in half to 10 mg due to hypotension.  Spoke with patient via phone who endorses "normal" blood pressure readings when taking furosemide.  We will send a refill with directions to take 1/2-1 full tablet daily as needed.  He will update.

## 2018-10-23 ENCOUNTER — Encounter: Payer: Self-pay | Admitting: Nurse Practitioner

## 2018-10-23 ENCOUNTER — Inpatient Hospital Stay: Payer: Medicare Other

## 2018-10-23 ENCOUNTER — Other Ambulatory Visit: Payer: Self-pay

## 2018-10-23 ENCOUNTER — Telehealth: Payer: Self-pay | Admitting: *Deleted

## 2018-10-23 ENCOUNTER — Inpatient Hospital Stay (HOSPITAL_BASED_OUTPATIENT_CLINIC_OR_DEPARTMENT_OTHER): Payer: Medicare Other | Admitting: Oncology

## 2018-10-23 VITALS — BP 96/60 | HR 61 | Temp 97.1°F | Resp 18 | Ht 67.0 in | Wt 122.2 lb

## 2018-10-23 DIAGNOSIS — D4620 Refractory anemia with excess of blasts, unspecified: Secondary | ICD-10-CM

## 2018-10-23 DIAGNOSIS — D638 Anemia in other chronic diseases classified elsewhere: Secondary | ICD-10-CM | POA: Diagnosis not present

## 2018-10-23 DIAGNOSIS — D471 Chronic myeloproliferative disease: Secondary | ICD-10-CM

## 2018-10-23 DIAGNOSIS — E860 Dehydration: Secondary | ICD-10-CM | POA: Diagnosis not present

## 2018-10-23 DIAGNOSIS — C61 Malignant neoplasm of prostate: Secondary | ICD-10-CM

## 2018-10-23 DIAGNOSIS — D72829 Elevated white blood cell count, unspecified: Secondary | ICD-10-CM

## 2018-10-23 DIAGNOSIS — R410 Disorientation, unspecified: Secondary | ICD-10-CM | POA: Diagnosis not present

## 2018-10-23 DIAGNOSIS — Z87891 Personal history of nicotine dependence: Secondary | ICD-10-CM | POA: Diagnosis not present

## 2018-10-23 DIAGNOSIS — C946 Myelodysplastic disease, not classified: Secondary | ICD-10-CM

## 2018-10-23 DIAGNOSIS — D649 Anemia, unspecified: Secondary | ICD-10-CM | POA: Diagnosis not present

## 2018-10-23 DIAGNOSIS — F05 Delirium due to known physiological condition: Secondary | ICD-10-CM

## 2018-10-23 LAB — CBC WITH DIFFERENTIAL/PLATELET
Abs Immature Granulocytes: 0.09 10*3/uL — ABNORMAL HIGH (ref 0.00–0.07)
Basophils Absolute: 0.1 10*3/uL (ref 0.0–0.1)
Basophils Relative: 0 %
Eosinophils Absolute: 0.1 10*3/uL (ref 0.0–0.5)
Eosinophils Relative: 1 %
HEMATOCRIT: 30.2 % — AB (ref 39.0–52.0)
Hemoglobin: 9.4 g/dL — ABNORMAL LOW (ref 13.0–17.0)
Immature Granulocytes: 1 %
Lymphocytes Relative: 5 %
Lymphs Abs: 0.6 10*3/uL — ABNORMAL LOW (ref 0.7–4.0)
MCH: 32.6 pg (ref 26.0–34.0)
MCHC: 31.1 g/dL (ref 30.0–36.0)
MCV: 104.9 fL — AB (ref 80.0–100.0)
MONO ABS: 1.1 10*3/uL — AB (ref 0.1–1.0)
Monocytes Relative: 9 %
Neutro Abs: 10 10*3/uL — ABNORMAL HIGH (ref 1.7–7.7)
Neutrophils Relative %: 84 %
Platelets: 193 10*3/uL (ref 150–400)
RBC: 2.88 MIL/uL — ABNORMAL LOW (ref 4.22–5.81)
RDW: 20.3 % — ABNORMAL HIGH (ref 11.5–15.5)
WBC: 11.9 10*3/uL — ABNORMAL HIGH (ref 4.0–10.5)
nRBC: 0 % (ref 0.0–0.2)

## 2018-10-23 LAB — LACTATE DEHYDROGENASE: LDH: 143 U/L (ref 98–192)

## 2018-10-23 LAB — COMPREHENSIVE METABOLIC PANEL
ALT: 10 U/L (ref 0–44)
AST: 18 U/L (ref 15–41)
Albumin: 3.7 g/dL (ref 3.5–5.0)
Alkaline Phosphatase: 99 U/L (ref 38–126)
Anion gap: 9 (ref 5–15)
BUN: 24 mg/dL — ABNORMAL HIGH (ref 8–23)
CO2: 25 mmol/L (ref 22–32)
Calcium: 8.4 mg/dL — ABNORMAL LOW (ref 8.9–10.3)
Chloride: 103 mmol/L (ref 98–111)
Creatinine, Ser: 1.01 mg/dL (ref 0.61–1.24)
GFR calc Af Amer: >60 mL/min (ref >60)
GFR calc non Af Amer: >60 mL/min (ref >60)
Glucose, Bld: 124 mg/dL — ABNORMAL HIGH (ref 70–99)
Potassium: 3.6 mmol/L (ref 3.5–5.1)
Sodium: 137 mmol/L (ref 135–145)
Total Bilirubin: 0.6 mg/dL (ref 0.3–1.2)
Total Protein: 7.1 g/dL (ref 6.5–8.1)

## 2018-10-23 MED ORDER — EPOETIN ALFA-EPBX 40000 UNIT/ML IJ SOLN
40000.0000 [IU] | Freq: Once | INTRAMUSCULAR | Status: AC
  Administered 2018-10-23: 40000 [IU] via SUBCUTANEOUS
  Filled 2018-10-23: qty 1

## 2018-10-23 MED ORDER — SODIUM CHLORIDE 0.9 % IV SOLN
Freq: Once | INTRAVENOUS | Status: AC
  Administered 2018-10-23: 17:00:00 via INTRAVENOUS
  Filled 2018-10-23: qty 250

## 2018-10-23 MED ORDER — CYANOCOBALAMIN 1000 MCG/ML IJ SOLN
1000.0000 ug | Freq: Once | INTRAMUSCULAR | Status: AC
  Administered 2018-10-23: 1000 ug via INTRAMUSCULAR
  Filled 2018-10-23: qty 1

## 2018-10-23 NOTE — Telephone Encounter (Signed)
Daughter called. Reports that her father has intermittent confusion. Denies any signs of dysuria or shortness of breath. Daughter requesting for additional labs if possible to determine cause of confusion. She has not laid eyes on her dad today. She will call our office back if necessary

## 2018-10-23 NOTE — Telephone Encounter (Signed)
Spoke with Sonia Baller, NP- v/o to obtain cbc, metc, ldh and u/a

## 2018-10-23 NOTE — Progress Notes (Signed)
[place] OFFICE PROGRESS NOTE  Patient Care Team: Elby Beck, FNP as PCP - General (Nurse Practitioner) Allyne Gee, MD (Internal Medicine)   SUMMARY OF HEMATOLOGIC/ONCOLOGIC HISTORY: # Leukocytosis- 20-30K [Dr.Pandit-BMBx 2013-No definitive features of a Myeloproliferative neoplasia including CMML, normal cytogenetics (58 XY. Normocellular to focally mildly hypocellular marrow for age (30-50%) with trilineage hematopoiesis, no overt monocytosis ; Flow study unremarkable. Peripheral blood flow- suggestive of chronic myelomonocytic leukemia]; JAK2/Bcr-Abl-NEG.2013- [place]- No hepato-splenomegaly  # Prostate cancer [early stage] previous f/u Dr.Tennenbaum [place]; on surveillance  # IDA- ? Etiology  # RA on MXT; prednisone 5mg  discon  INTERVAL HISTORY: Thomas Townsend, 85 year old male with above, longstanding history of leukocytosis/myeloproliferative neoplasm-unclassified, and mild anemia, who returns to clinic for follow-up. Patient follows up with Dr. Rogue Bussing who is off today. I am covering Dr. B to evaluate this patient.  Daughter reports that patient has been more confused and disorientated recently.  Denies any fever or chills.  Due to his advanced age, chronic hearing impairment, patient is a poor historian.  Providing limited history. Daughter reports the patient still eats however does not drink enough fluid.  No reports of nausea, vomiting, diarrhea.  Abdominal pain, shortness of breath. Patient has lasted 13 pounds since last weight that was recorded on 09/10/2018.  Review of Systems  Constitutional: Positive for malaise/fatigue. Negative for chills, diaphoresis, fever and weight loss.  HENT: Negative for nosebleeds and sore throat.   Eyes: Negative for double vision and redness.  Respiratory: Positive for shortness of breath. Negative for cough, hemoptysis, sputum production and wheezing.   Cardiovascular: Negative for chest pain, palpitations, orthopnea  and leg swelling.  Gastrointestinal: Negative for abdominal pain, blood in stool, constipation, diarrhea, heartburn, melena, nausea and vomiting.  Genitourinary: Negative for dysuria, frequency and urgency.  Musculoskeletal: Negative for back pain, joint pain and myalgias.  Skin: Negative.  Negative for itching and rash.  Neurological: Negative for dizziness, tingling, tremors, focal weakness, weakness and headaches.  Endo/Heme/Allergies: Bruises/bleeds easily.  Psychiatric/Behavioral: Negative for depression and hallucinations. The patient is not nervous/anxious and does not have insomnia.     PAST MEDICAL HISTORY :  Past Medical History:  Diagnosis Date  . Arthritis   . Atrial fibrillation ([place])   . CHF (congestive heart failure) ([place])   . COPD (chronic obstructive pulmonary disease) ([place])   . Dysrhythmia   . GERD (gastroesophageal reflux disease)   . Hyperlipemia   . Hypertension   . Leukocytosis 01/14/2016  . Prostate cancer ([place])   . Stroke [facility])     PAST SURGICAL HISTORY :   Past Surgical History:  Procedure Laterality Date  . CHOLECYSTECTOMY    . COLONOSCOPY WITH PROPOFOL N/A 04/04/2015   Procedure: COLONOSCOPY WITH PROPOFOL;  Surgeon: Manya Silvas, MD;  Location: [facility] ENDOSCOPY;  Service: Endoscopy;  Laterality: N/A;  . ESOPHAGOGASTRODUODENOSCOPY N/A 04/04/2015   Procedure: ESOPHAGOGASTRODUODENOSCOPY (EGD);  Surgeon: Manya Silvas, MD;  Location: [facility] ENDOSCOPY;  Service: Endoscopy;  Laterality: N/A;  . EYE SURGERY      FAMILY HISTORY :   Family History  Problem Relation Age of Onset  . Hypertension Other     SOCIAL HISTORY:   Social History   Tobacco Use  . Smoking status: Former Smoker    Years: 20.00    Types: Cigarettes  . Smokeless tobacco: Never Used  Substance Use Topics  . Alcohol use: Yes    Alcohol/week: 1.0 standard drinks    Types: 1 Cans of beer  per week    Comment: Wine every once in a while  . Drug use: No   ALLERGIES:  has No Known  Allergies.  MEDICATIONS:  Current Outpatient Medications  Medication Sig Dispense Refill  . BREO ELLIPTA 100-25 MCG/INH AEPB Inhale 1 puff into the lungs every morning. 60 each 5  . citalopram (CELEXA) 20 MG tablet TAKE 1 TABLET BY MOUTH EVERY DAY 90 tablet 0  . digoxin (LANOXIN) 0.125 MG tablet Take 1 tablet (0.125 mg total) every morning by mouth. 90 tablet 1  . ferrous sulfate 325 (65 FE) MG tablet Take 325 mg by mouth every morning. Reported on 02/25/2016    . fluticasone (FLONASE) 50 MCG/ACT nasal spray SPRAY 2 SPRAYS INTO EACH NOSTRIL EVERY DAY 48 g 0  . folic acid (FOLVITE) 1 MG tablet Take 1 mg by mouth daily.    . furosemide (LASIX) 20 MG tablet Take 0.5-1 tablets (10-20 mg total) by mouth daily as needed. For leg swelling. 30 tablet 0  . methotrexate (RHEUMATREX) 2.5 MG tablet TAKE 6 TABLETS (15 MG TOTAL) BY MOUTH EVERY 7 (SEVEN) DAYS.  4  . montelukast (SINGULAIR) 10 MG tablet Take 10 mg by mouth daily.     . Multiple Vitamins-Minerals (PRESERVISION AREDS PO) Take 1 tablet by mouth 2 (two) times daily.     Marland Kitchen warfarin (COUMADIN) 4 MG tablet Take 2-4 mg by mouth one time only at 6 PM. 2MG -MONDAY,WEDNESDAY,FRIDAY 4MG -Sunday,TUESDAY,THURSDAY,SATURDAY     No current facility-administered medications for this visit.     PHYSICAL EXAMINATION:  ECOG PERFORMANCE STATUS: 3 BP 96/60 (BP Location: Left Arm, Patient Position: Sitting)   Pulse 61   Temp (!) 97.1 F (36.2 C) (Tympanic)   Resp 18   Ht 5\' 7"  (1.702 m)   Wt 122 lb 3.2 oz (55.4 kg)   BMI 19.14 kg/m  There were no vitals filed for this visit.  Physical Exam  Constitutional: He is oriented to person, place, and time and well-developed, well-nourished, and in no distress. No distress.  Frail-appearing Caucasian male patient. Accompanied by his daughter.  Walking without any assistive devices.  HENT:  Head: Normocephalic and atraumatic.  Nose: Nose normal.  Mouth/Throat: Oropharynx is clear and moist. No oropharyngeal  exudate.  Crusted lip Dry oral mucosa  Eyes: Pupils are equal, round, and reactive to light. EOM are normal. No scleral icterus.  Neck: Normal range of motion. Neck supple.  Cardiovascular: Normal rate and regular rhythm.  No murmur heard. Pulmonary/Chest: Effort normal. No respiratory distress. He has no wheezes. He has no rales.  Decreased air entry.  Abdominal: Soft. Bowel sounds are normal. He exhibits no distension. There is no abdominal tenderness. There is no rebound.  Musculoskeletal: Normal range of motion.        General: No tenderness or edema.  Neurological: He is alert and oriented to person, place, and time. No cranial nerve deficit. He exhibits normal muscle tone. Coordination normal.  Skin: Skin is warm and dry. He is not diaphoretic. No erythema.  Chronic bruises noted.  Psychiatric: Affect normal.    LABORATORY DATA:  I have reviewed the data as listed    Component Value Date/Time   NA 137 10/23/2018 1445   NA 140 01/22/2015 0345   K 3.6 10/23/2018 1445   K 3.7 01/22/2015 0345   CL 103 10/23/2018 1445   CL 106 01/22/2015 0345   CO2 25 10/23/2018 1445   CO2 25 01/22/2015 0345   GLUCOSE 124 (H) 10/23/2018 1445  GLUCOSE 89 01/22/2015 0345   BUN 24 (H) 10/23/2018 1445   BUN 16 01/22/2015 0345   CREATININE 1.01 10/23/2018 1445   CREATININE 0.88 01/22/2015 0345   CALCIUM 8.4 (L) 10/23/2018 1445   CALCIUM 7.7 (L) 01/22/2015 0345   PROT 7.1 10/23/2018 1445   PROT 6.8 01/21/2015 1946   ALBUMIN 3.7 10/23/2018 1445   ALBUMIN 3.3 (L) 01/21/2015 1946   AST 18 10/23/2018 1445   AST 20 01/21/2015 1946   ALT 10 10/23/2018 1445   ALT 6 (L) 01/21/2015 1946   ALKPHOS 99 10/23/2018 1445   ALKPHOS 86 01/21/2015 1946   BILITOT 0.6 10/23/2018 1445   BILITOT 0.9 01/21/2015 1946   GFRNONAA >60 10/23/2018 1445   GFRNONAA >60 01/22/2015 0345   GFRAA >60 10/23/2018 1445   GFRAA >60 01/22/2015 0345    No results found for: SPEP, UPEP  Lab Results  Component Value Date    WBC 11.9 (H) 10/23/2018   NEUTROABS 10.0 (H) 10/23/2018   HGB 9.4 (L) 10/23/2018   HCT 30.2 (L) 10/23/2018   MCV 104.9 (H) 10/23/2018   PLT 193 10/23/2018      Chemistry      Component Value Date/Time   NA 137 10/23/2018 1445   NA 140 01/22/2015 0345   K 3.6 10/23/2018 1445   K 3.7 01/22/2015 0345   CL 103 10/23/2018 1445   CL 106 01/22/2015 0345   CO2 25 10/23/2018 1445   CO2 25 01/22/2015 0345   BUN 24 (H) 10/23/2018 1445   BUN 16 01/22/2015 0345   CREATININE 1.01 10/23/2018 1445   CREATININE 0.88 01/22/2015 0345      Component Value Date/Time   CALCIUM 8.4 (L) 10/23/2018 1445   CALCIUM 7.7 (L) 01/22/2015 0345   ALKPHOS 99 10/23/2018 1445   ALKPHOS 86 01/21/2015 1946   AST 18 10/23/2018 1445   AST 20 01/21/2015 1946   ALT 10 10/23/2018 1445   ALT 6 (L) 01/21/2015 1946   BILITOT 0.6 10/23/2018 1445   BILITOT 0.9 01/21/2015 1946         ASSESSMENT & PLAN:  1. Myeloproliferative neoplasm ([place])   2. Anemia of chronic disease   3. Malignant neoplasm of prostate ([place])   4. Dehydration   5. Confusion    #Confusion and dehydration/borderline low blood pressure, likely due to decreased oral intake. Etiology unknown.  UA has been obtained and result is pending. We will proceed with 500 cc IV fluid hydration given in 1 hour History of borderline low vitamin B12.  B12 cannot be added to today's lab work.  I will give patient a dose of parenteral vitamin B12 injection empirically.  #Myeloproliferative disorder, MDS/CMML. Hemoglobin stable.  Proceed with Retacrit Chronic leukocytosis, likely secondary to myeloproliferative disorder. # Weight loss, with declining function status. If no improvement with supportive care, recommend establish care with palliative service with Thomas Townsend.   Recommend short term follow Up with Dr.Brahmanday.   We spent sufficient time to discuss many aspect of care, questions were answered to patient's satisfaction. Total face to face  encounter time for this patient visit was 25 min. >50% of the time was  spent in counseling and coordination of care.   Earlie Server, MD, PhD Hematology Oncology [facility] Pager- [phone] 10/23/2018

## 2018-10-24 ENCOUNTER — Other Ambulatory Visit: Payer: Self-pay | Admitting: *Deleted

## 2018-10-24 ENCOUNTER — Telehealth: Payer: Self-pay

## 2018-10-24 DIAGNOSIS — D4620 Refractory anemia with excess of blasts, unspecified: Secondary | ICD-10-CM

## 2018-10-24 DIAGNOSIS — C9110 Chronic lymphocytic leukemia of B-cell type not having achieved remission: Secondary | ICD-10-CM

## 2018-10-24 NOTE — Telephone Encounter (Signed)
Patient was seen in the office yesterday, and a UA was to be collected. Patient told RN that he left a urine specimen in the lab but per the Josh in the lab, a UA was not collected.  I left message for patient's daughter that patient needs to come in for a lab appt to leave a urine sample.  Colette was able to get in contact with patient's daughter and she states she will have the patient collect a urine specimen tonight and she will bring it in tomorrow and drop it off in the lab. Thanks!

## 2018-10-24 NOTE — Progress Notes (Signed)
UA order from yesterday was d/c.  Patient called saying that they would bring urine in the office tomorrow morning, UA reordered

## 2018-10-24 NOTE — Telephone Encounter (Signed)
Thank you :)

## 2018-10-25 ENCOUNTER — Other Ambulatory Visit: Payer: Self-pay

## 2018-10-25 DIAGNOSIS — Z87891 Personal history of nicotine dependence: Secondary | ICD-10-CM | POA: Diagnosis not present

## 2018-10-25 DIAGNOSIS — C9110 Chronic lymphocytic leukemia of B-cell type not having achieved remission: Secondary | ICD-10-CM

## 2018-10-25 DIAGNOSIS — C946 Myelodysplastic disease, not classified: Secondary | ICD-10-CM | POA: Diagnosis not present

## 2018-10-25 DIAGNOSIS — D649 Anemia, unspecified: Secondary | ICD-10-CM | POA: Diagnosis not present

## 2018-10-25 DIAGNOSIS — E860 Dehydration: Secondary | ICD-10-CM | POA: Diagnosis not present

## 2018-10-25 DIAGNOSIS — D638 Anemia in other chronic diseases classified elsewhere: Secondary | ICD-10-CM | POA: Diagnosis not present

## 2018-10-25 DIAGNOSIS — D72829 Elevated white blood cell count, unspecified: Secondary | ICD-10-CM | POA: Diagnosis not present

## 2018-10-25 DIAGNOSIS — R410 Disorientation, unspecified: Secondary | ICD-10-CM | POA: Diagnosis not present

## 2018-10-25 LAB — URINALYSIS, COMPLETE (UACMP) WITH MICROSCOPIC
Bilirubin Urine: NEGATIVE
Glucose, UA: NEGATIVE mg/dL
HGB URINE DIPSTICK: NEGATIVE
Ketones, ur: NEGATIVE mg/dL
Leukocytes, UA: NEGATIVE
Nitrite: NEGATIVE
PROTEIN: NEGATIVE mg/dL
Specific Gravity, Urine: 1.02 (ref 1.005–1.030)
pH: 5 (ref 5.0–8.0)

## 2018-10-26 ENCOUNTER — Ambulatory Visit (INDEPENDENT_AMBULATORY_CARE_PROVIDER_SITE_OTHER): Payer: Medicare Other | Admitting: General Practice

## 2018-10-26 DIAGNOSIS — J189 Pneumonia, unspecified organism: Secondary | ICD-10-CM | POA: Diagnosis not present

## 2018-10-26 DIAGNOSIS — I4891 Unspecified atrial fibrillation: Secondary | ICD-10-CM

## 2018-10-26 DIAGNOSIS — Z7901 Long term (current) use of anticoagulants: Secondary | ICD-10-CM | POA: Diagnosis not present

## 2018-10-26 LAB — URINE CULTURE: Culture: <10000 — AB

## 2018-10-26 LAB — POCT INR: INR: 3.2 — AB (ref 2.0–3.0)

## 2018-10-26 NOTE — Patient Instructions (Addendum)
Pre visit review using our clinic review tool, if applicable. No additional management support is needed unless otherwise documented below in the visit note.  Skip coumadin today and then continue taking 1 whole tablet (4mg ) daily EXCEPT for 1/2 pill (2mg ) on Mondays only.  Recheck in 3 to 4 weeks. Patient's son is with him today and verbalizes understanding of dosing instructions.

## 2018-10-30 ENCOUNTER — Other Ambulatory Visit: Payer: Self-pay | Admitting: Family Medicine

## 2018-10-30 DIAGNOSIS — Z79899 Other long term (current) drug therapy: Secondary | ICD-10-CM | POA: Diagnosis not present

## 2018-10-30 DIAGNOSIS — M0579 Rheumatoid arthritis with rheumatoid factor of multiple sites without organ or systems involvement: Secondary | ICD-10-CM | POA: Diagnosis not present

## 2018-11-02 ENCOUNTER — Telehealth: Payer: Self-pay | Admitting: *Deleted

## 2018-11-02 NOTE — Telephone Encounter (Signed)
Received phone call from daughter. Pt had labs drawn at [facility] on 2/3. hgb dropped slightly to 8.8, but pt is asymptomatic. Daughter would like to keep apts as scheduled for now. If pt becomes short of breath, she will contact our office back. She would like to her dad to have an apt with Josh Borders to establish care due to slight decline in her dad's health with MDS. Apt made per daughter's request for next Friday 2/14 at 10 am

## 2018-11-10 ENCOUNTER — Encounter: Payer: Medicare Other | Admitting: Hospice and Palliative Medicine

## 2018-11-15 ENCOUNTER — Other Ambulatory Visit: Payer: Self-pay | Admitting: Family Medicine

## 2018-11-15 DIAGNOSIS — I5032 Chronic diastolic (congestive) heart failure: Secondary | ICD-10-CM

## 2018-11-15 DIAGNOSIS — R600 Localized edema: Secondary | ICD-10-CM

## 2018-11-15 NOTE — Telephone Encounter (Signed)
Electronic refill request Furosemide Last office visit 08/31/18 acute Last refill 10/20/18 See office note on 07/07/18

## 2018-11-15 NOTE — Telephone Encounter (Signed)
Please call his son or daughter and ask if he is taking this every day? Is he having leg swelling? Does he need a refill or did pharmacy request on his behalf?

## 2018-11-16 NOTE — Telephone Encounter (Signed)
Spoke to pt's daughter per DPR. She said she thinks he takes it every other day. He is swelling more often. This is set up to do automatic refills.

## 2018-11-23 ENCOUNTER — Ambulatory Visit (INDEPENDENT_AMBULATORY_CARE_PROVIDER_SITE_OTHER): Payer: Medicare Other | Admitting: General Practice

## 2018-11-23 DIAGNOSIS — Z7901 Long term (current) use of anticoagulants: Secondary | ICD-10-CM

## 2018-11-23 DIAGNOSIS — I4891 Unspecified atrial fibrillation: Secondary | ICD-10-CM

## 2018-11-23 LAB — POCT INR: INR: 1.9 — AB (ref 2.0–3.0)

## 2018-11-23 NOTE — Patient Instructions (Addendum)
Pre visit review using our clinic review tool, if applicable. No additional management support is needed unless otherwise documented below in the visit note.  Take 1 1/2 tablets today (2/27) and then continue taking 1 whole tablet (4mg ) daily EXCEPT for 1/2 pill (2mg ) on Mondays only.  Recheck in 3 to 4 weeks. Patient's son is with him today and verbalizes understanding of dosing instructions.

## 2018-11-25 DIAGNOSIS — J189 Pneumonia, unspecified organism: Secondary | ICD-10-CM | POA: Diagnosis not present

## 2018-11-27 ENCOUNTER — Other Ambulatory Visit: Payer: Self-pay | Admitting: Family Medicine

## 2018-12-15 ENCOUNTER — Other Ambulatory Visit: Payer: Self-pay | Admitting: Family Medicine

## 2018-12-15 DIAGNOSIS — I5032 Chronic diastolic (congestive) heart failure: Secondary | ICD-10-CM

## 2018-12-15 DIAGNOSIS — R600 Localized edema: Secondary | ICD-10-CM

## 2018-12-20 ENCOUNTER — Other Ambulatory Visit: Payer: Medicare Other

## 2018-12-20 ENCOUNTER — Ambulatory Visit: Payer: Medicare Other | Admitting: Internal Medicine

## 2018-12-21 ENCOUNTER — Other Ambulatory Visit (INDEPENDENT_AMBULATORY_CARE_PROVIDER_SITE_OTHER): Payer: Medicare Other

## 2018-12-21 ENCOUNTER — Other Ambulatory Visit: Payer: Self-pay

## 2018-12-21 ENCOUNTER — Ambulatory Visit (INDEPENDENT_AMBULATORY_CARE_PROVIDER_SITE_OTHER): Payer: Medicare Other | Admitting: General Practice

## 2018-12-21 DIAGNOSIS — Z7901 Long term (current) use of anticoagulants: Secondary | ICD-10-CM

## 2018-12-21 DIAGNOSIS — I4891 Unspecified atrial fibrillation: Secondary | ICD-10-CM | POA: Diagnosis not present

## 2018-12-21 LAB — POCT INR: INR: 2.5 (ref 2.0–3.0)

## 2018-12-21 NOTE — Patient Instructions (Signed)
Pre visit review using our clinic review tool, if applicable. No additional management support is needed unless otherwise documented below in the visit note.  Please continue to take 1 whole tablet (4mg ) daily EXCEPT for 1/2 pill (2mg ) on Mondays only.  Recheck in 4 weeks. Patient's son is with him today.  Patient's INR was checked in the lab.  Patient and son have been called and given dosing instructions.  Patient to return on 4/23 @ 9:30

## 2018-12-25 DIAGNOSIS — J189 Pneumonia, unspecified organism: Secondary | ICD-10-CM | POA: Diagnosis not present

## 2018-12-25 DIAGNOSIS — R531 Weakness: Secondary | ICD-10-CM | POA: Diagnosis not present

## 2018-12-28 ENCOUNTER — Other Ambulatory Visit: Payer: Self-pay | Admitting: Internal Medicine

## 2019-01-18 ENCOUNTER — Other Ambulatory Visit (INDEPENDENT_AMBULATORY_CARE_PROVIDER_SITE_OTHER): Payer: Medicare Other

## 2019-01-18 ENCOUNTER — Other Ambulatory Visit: Payer: Self-pay

## 2019-01-18 ENCOUNTER — Ambulatory Visit (INDEPENDENT_AMBULATORY_CARE_PROVIDER_SITE_OTHER): Payer: Medicare Other | Admitting: General Practice

## 2019-01-18 DIAGNOSIS — I4891 Unspecified atrial fibrillation: Secondary | ICD-10-CM

## 2019-01-18 DIAGNOSIS — Z7901 Long term (current) use of anticoagulants: Secondary | ICD-10-CM

## 2019-01-18 LAB — POCT INR: INR: 3 (ref 2.0–3.0)

## 2019-01-18 NOTE — Patient Instructions (Signed)
Pre visit review using our clinic review tool, if applicable. No additional management support is needed unless otherwise documented below in the visit note.  Please continue to take 1 whole tablet (4mg ) daily EXCEPT for 1/2 pill (2mg ) on Mondays only.  Recheck in 4 weeks. Patient's son is with him today.  Patient's INR was checked in the lab.  Patient and son have been called and given dosing instructions.  Patient to return on 5/21.

## 2019-01-20 ENCOUNTER — Other Ambulatory Visit: Payer: Self-pay | Admitting: Family Medicine

## 2019-01-20 DIAGNOSIS — R600 Localized edema: Secondary | ICD-10-CM

## 2019-01-20 DIAGNOSIS — I5032 Chronic diastolic (congestive) heart failure: Secondary | ICD-10-CM

## 2019-01-22 NOTE — Telephone Encounter (Signed)
Left message for  Wynona Canes (daughter), to see if patient needs this refill at this time? In March the note said patient did not need it then, per last note patient is using this medication every other day. Want to verify.

## 2019-01-24 NOTE — Telephone Encounter (Signed)
Left message for [name] to call back to discuss.

## 2019-01-24 NOTE — Telephone Encounter (Signed)
Spoke with Thomas Townsend, patient takes Lasix as needed (for example took 1 tablet in the last 3 weeks). Called pharmacy and told them patient does not need this refilled and has plenty now. Advised daughter to just call pharmacy when they need it. Pharmacist-Brittany-said it is not on automatic refill but patient did not request a refill so I am not sure why it came over for refill then.

## 2019-01-25 DIAGNOSIS — R531 Weakness: Secondary | ICD-10-CM | POA: Diagnosis not present

## 2019-01-25 DIAGNOSIS — J189 Pneumonia, unspecified organism: Secondary | ICD-10-CM | POA: Diagnosis not present

## 2019-02-12 ENCOUNTER — Telehealth: Payer: Self-pay

## 2019-02-12 NOTE — Telephone Encounter (Signed)
Left detailed VM w COVID screen and curbside info   

## 2019-02-15 ENCOUNTER — Ambulatory Visit (INDEPENDENT_AMBULATORY_CARE_PROVIDER_SITE_OTHER): Payer: Medicare Other | Admitting: General Practice

## 2019-02-15 ENCOUNTER — Other Ambulatory Visit (INDEPENDENT_AMBULATORY_CARE_PROVIDER_SITE_OTHER): Payer: Medicare Other

## 2019-02-15 DIAGNOSIS — I4891 Unspecified atrial fibrillation: Secondary | ICD-10-CM | POA: Diagnosis not present

## 2019-02-15 DIAGNOSIS — Z7901 Long term (current) use of anticoagulants: Secondary | ICD-10-CM

## 2019-02-15 LAB — POCT INR: INR: 2.4 (ref 2.0–3.0)

## 2019-02-15 NOTE — Patient Instructions (Signed)
Pre visit review using our clinic review tool, if applicable. No additional management support is needed unless otherwise documented below in the visit note.  Please continue to take 1 whole tablet (4mg ) daily EXCEPT for 1/2 pill (2mg ) on Mondays only.  Recheck in 6 weeks. Patient's son is with him today.  Patient's INR was checked in the lab.  Patient and son have been called and given dosing instructions.  Patient to return on 7/2

## 2019-02-21 ENCOUNTER — Inpatient Hospital Stay: Payer: Medicare Other | Admitting: Internal Medicine

## 2019-02-21 ENCOUNTER — Inpatient Hospital Stay: Payer: Medicare Other

## 2019-02-24 DIAGNOSIS — J189 Pneumonia, unspecified organism: Secondary | ICD-10-CM | POA: Diagnosis not present

## 2019-02-24 DIAGNOSIS — R531 Weakness: Secondary | ICD-10-CM | POA: Diagnosis not present

## 2019-03-05 ENCOUNTER — Telehealth: Payer: Self-pay | Admitting: Pulmonary Disease

## 2019-03-05 NOTE — Telephone Encounter (Signed)
LM for [name] to call back regarding apt for her dad.

## 2019-03-05 NOTE — Telephone Encounter (Signed)
Spoke to Daughter [name]), patient is using 2L continuous during the day, and his sats are staying around 90%, when he is up and exerts himself he will drop down to 88%. Will set up with apt with Dr. Alva Garnet for next week.

## 2019-03-05 NOTE — Telephone Encounter (Signed)
Got a call from pt's daughter [name] ([facility], Stated that pt is not doing well, feeling weak and having trouble breathing. No other symptoms. She wonders if he should come in to be seen and also would like to see if getting a portable tank would be a good idea to help him better.

## 2019-03-12 ENCOUNTER — Ambulatory Visit
Admission: RE | Admit: 2019-03-12 | Discharge: 2019-03-12 | Disposition: A | Discharge status: Home / Self Care | Payer: Medicare Other | Source: Ambulatory Visit | Attending: Pulmonary Disease | Admitting: Pulmonary Disease

## 2019-03-12 ENCOUNTER — Other Ambulatory Visit: Payer: Self-pay

## 2019-03-12 ENCOUNTER — Ambulatory Visit: Payer: Medicare Other | Admitting: Pulmonary Disease

## 2019-03-12 ENCOUNTER — Encounter: Payer: Self-pay | Admitting: Pulmonary Disease

## 2019-03-12 ENCOUNTER — Other Ambulatory Visit
Admission: RE | Admit: 2019-03-12 | Discharge: 2019-03-12 | Disposition: A | Discharge status: Home / Self Care | Payer: Medicare Other | Source: Ambulatory Visit | Attending: Pulmonary Disease | Admitting: Pulmonary Disease

## 2019-03-12 ENCOUNTER — Telehealth: Payer: Self-pay

## 2019-03-12 VITALS — BP 98/52 | HR 75 | Temp 97.5°F | Ht 67.0 in | Wt 122.0 lb

## 2019-03-12 DIAGNOSIS — I700 Atherosclerosis of aorta: Secondary | ICD-10-CM | POA: Diagnosis not present

## 2019-03-12 DIAGNOSIS — J449 Chronic obstructive pulmonary disease, unspecified: Secondary | ICD-10-CM | POA: Insufficient documentation

## 2019-03-12 DIAGNOSIS — M4854XA Collapsed vertebra, not elsewhere classified, thoracic region, initial encounter for fracture: Secondary | ICD-10-CM | POA: Insufficient documentation

## 2019-03-12 DIAGNOSIS — Z87891 Personal history of nicotine dependence: Secondary | ICD-10-CM

## 2019-03-12 DIAGNOSIS — I482 Chronic atrial fibrillation, unspecified: Secondary | ICD-10-CM | POA: Insufficient documentation

## 2019-03-12 DIAGNOSIS — R0609 Other forms of dyspnea: Secondary | ICD-10-CM | POA: Insufficient documentation

## 2019-03-12 DIAGNOSIS — R531 Weakness: Secondary | ICD-10-CM | POA: Insufficient documentation

## 2019-03-12 DIAGNOSIS — C9110 Chronic lymphocytic leukemia of B-cell type not having achieved remission: Secondary | ICD-10-CM | POA: Diagnosis not present

## 2019-03-12 DIAGNOSIS — J9611 Chronic respiratory failure with hypoxia: Secondary | ICD-10-CM

## 2019-03-12 LAB — CBC WITH DIFFERENTIAL/PLATELET
Abs Immature Granulocytes: 0.62 10*3/uL — ABNORMAL HIGH (ref 0.00–0.07)
Basophils Absolute: 0.1 10*3/uL (ref 0.0–0.1)
Basophils Relative: 0 %
Eosinophils Absolute: 0.2 10*3/uL (ref 0.0–0.5)
Eosinophils Relative: 1 %
HCT: 26.2 % — ABNORMAL LOW (ref 39.0–52.0)
Hemoglobin: 8 g/dL — ABNORMAL LOW (ref 13.0–17.0)
Immature Granulocytes: 3 %
Lymphocytes Relative: 4 %
Lymphs Abs: 0.9 10*3/uL (ref 0.7–4.0)
MCH: 32.9 pg (ref 26.0–34.0)
MCHC: 30.5 g/dL (ref 30.0–36.0)
MCV: 107.8 fL — ABNORMAL HIGH (ref 80.0–100.0)
Monocytes Absolute: 5.5 10*3/uL — ABNORMAL HIGH (ref 0.1–1.0)
Monocytes Relative: 22 %
Neutro Abs: 17.8 10*3/uL — ABNORMAL HIGH (ref 1.7–7.7)
Neutrophils Relative %: 70 %
Platelets: 336 10*3/uL (ref 150–400)
RBC: 2.43 MIL/uL — ABNORMAL LOW (ref 4.22–5.81)
RDW: 19.3 % — ABNORMAL HIGH (ref 11.5–15.5)
Smear Review: NORMAL
WBC: 25.2 10*3/uL — ABNORMAL HIGH (ref 4.0–10.5)
nRBC: 0 % (ref 0.0–0.2)

## 2019-03-12 LAB — BRAIN NATRIURETIC PEPTIDE: B Natriuretic Peptide: 430 pg/mL — ABNORMAL HIGH (ref 0.0–100.0)

## 2019-03-12 NOTE — Telephone Encounter (Signed)
Spoke to daughter, [name], regarding lab results. She is aware this has been relayed to Dr. Rogue Bussing. Nothing further needed at this time.

## 2019-03-12 NOTE — Telephone Encounter (Signed)
-----   Message from Wilhelmina Mcardle, MD sent at 03/12/2019 12:04 PM EDT ----- Let his daughter know that his anemia is somewhat worse than when it was last checked in January.  This might be contributing to worsening shortness of breath and weakness.  I have notified Dr Rogue Bussing and he will decide if there is anything else to do right now.  Thanks  Waunita Schooner

## 2019-03-12 NOTE — Addendum Note (Signed)
Addended by: Darreld Mclean on: 03/12/2019 03:00 PM   Modules accepted: Orders

## 2019-03-12 NOTE — Progress Notes (Signed)
PULMONARY OFFICE FOLLOW-UP NOTE  Requesting MD/Service: Self referred Date of initial consultation: 08/05/17 Reason for consultation: h/o COPD  PT PROFILE: 85 y.o. male former remote smoker previously seen by Dr Humphrey Rolls with RA, prior dx of COPD and pulmonary fibrosis  DATA: Echocardiogram 08/08/15: LVEF 55-60%. Mild AS. RVSP est 55 mmHg CT chest 06/24/16: Mild cardiac enlargement, multi vessel coronary artery calcifications, pleural effusions and interstitial edema suspicious for CHF. Diffuse bronchial wall thickening with emphysema, as above; imaging findings suggestive of underlying COPD. Borderline enlarged right paratracheal lymph nodes identified. Nonspecific in the setting of CHF CXR 06/29/17: lingular opacity PFTs 09/29/17: Moderate obstruction.  FEV1 1.79 L (76% predicted), FEV1/FVC 48%, lung volumes normal, diffusion capacity moderately reduced at 51% predicted  INTERVAL: Last visit January 2019.  SUBJ: This appointment was made at the request of the patient's daughter.  I last saw him in the office in January 2019.  I also saw him when hospitalized for pneumonia in June 2019.  The patient's sister (daughter's aunt) is a retired Statistician and suggested that he be seen by a pulmonologist.  The patient is not able to provide much history due to dementia and severe hearing loss.  His daughter reports that he is extremely limited in exercise tolerance but believes that it is due more to weakness and shortness of breath.  He has oxygen at home which he wears at night but does not wear it much during the day due to the weight of the tanks.  He was previously prescribed a Breo inhaler which she is not using due to lack of perceived benefit.  His daughter reports that he has minimal cough and does not report chest pain.  He does have chronic ankle edema which is variable in severity.  He takes furosemide as needed for increased lower extremity edema.  In addition to dementia, he has  previously been diagnosed with CLL, most recently termed myeloproliferative disorder.  He also has chronic atrial fibrillation.   Vitals:   03/12/19 0907  BP: (!) 98/52  Pulse: 75  Temp: (!) 97.5 F (36.4 C)  TempSrc: Oral  SpO2: 91%  Weight: 122 lb (55.3 kg)  Height: 5\' 7"  (1.702 m)  RA   EXAM:  Gen: W very frail, pale, in no overt respiratory distress HEENT: NCAT, sclerae white, very pale conjunctivae, oropharynx normal Neck: No LAN, no JVD noted Lungs: full BS, no adventitious sounds Cardiovascular: IR IR, rate controlled, no M noted Abdomen: Soft, NT, +BS Ext: Symmetric ankle edema Neuro: Generalized weakness, PERRL, EOMI, motor/sensory grossly intact Skin: No lesions noted   DATA:   BMP Latest Ref Rng & Units 10/23/2018 08/16/2018 04/12/2018  Glucose 70 - 99 mg/dL 124(H) 107(H) 123(H)  BUN 8 - 23 mg/dL 24(H) 25(H) 18  Creatinine 0.61 - 1.24 mg/dL 1.01 1.18 0.96  Sodium 135 - 145 mmol/L 137 133(L) 136  Potassium 3.5 - 5.1 mmol/L 3.6 3.9 3.4(L)  Chloride 98 - 111 mmol/L 103 102 103  CO2 22 - 32 mmol/L 25 24 25   Calcium 8.9 - 10.3 mg/dL 8.4(L) 8.6(L) 8.6(L)    CBC Latest Ref Rng & Units 03/12/2019 10/23/2018 10/09/2018  WBC 4.0 - 10.5 K/uL 25.2(H) 11.9(H) -  Hemoglobin 13.0 - 17.0 g/dL 8.0(L) 9.4(L) 9.6(L)  Hematocrit 39.0 - 52.0 % 26.2(L) 30.2(L) 31.4(L)  Platelets 150 - 400 K/uL 336 193 -    CXR 03/12/19: Moderate kyphosis, generally accentuated interstitium, chronic appearing lingular opacity.  Compared to prior film 03/09/2019, bilateral infiltrates/effusions are  much improved.  IMPRESSION:     ICD-10-CM   1. Former smoker  Z87.891 DG Chest 2 View  2. Chronic hypoxemic respiratory failure (HCC)  J96.11 DG Chest 2 View  3. Dyspnea on exertion  R06.09 DG Chest 2 View    Brain natriuretic peptide    CANCELED: Brain natriuretic peptide  4. Weakness  R53.1 CBC with Differential/Platelet    CANCELED: CBC with Differential/Platelet  5. CLL (chronic lymphocytic  leukemia) (HCC)  C91.10 CBC with Differential/Platelet    CANCELED: CBC with Differential/Platelet  6. Mild-moderate COPD.  Previously, no benefit from bronchodilator therapy  J44.9 DG Chest 2 View  7. Chronic atrial fibrillation  I48.20 Brain natriuretic peptide    CANCELED: Brain natriuretic peptide  8. Chronic ILD - likely related to RA. Does not appear to be progressive  Progressive dyspnea (and weakness) are possibly related to worsening of his lung disease but more likely attributable to his other chronic illnesses, specifically myeloproliferative disorder (with consequent anemia) and/or chronic atrial fibrillation with possible component of CHF (noting increased LE edema)  PLAN:  CXR ordered today and reviewed above Blood test today: CBC, BMP If he has significant anemia, I will alert Dr. Rogue Bussing to consider further management of this If BNP is significantly elevated, I will notify Dr. Saralyn Pilar as indicated I have encouraged him to continue to wear oxygen as close to 24 hours/day as possible We will attempt to procure a portable oxygen concentrator Follow-up in 6 months.  Call sooner if needed   Merton Border, MD PCCM service Mobile [phone] Pager [phone] 03/12/2019 11:52 AM

## 2019-03-12 NOTE — Patient Instructions (Addendum)
Continue oxygen therapy with sleep and as much of daytime as possible We will work to obtain a portable oxygen concentrator Chest x-ray today Blood tests today: CBC, BNP Follow-up 6 months.  Call sooner if needed

## 2019-03-14 ENCOUNTER — Ambulatory Visit: Payer: Medicare Other | Admitting: Pulmonary Disease

## 2019-03-14 ENCOUNTER — Telehealth: Payer: Self-pay | Admitting: *Deleted

## 2019-03-14 NOTE — Telephone Encounter (Signed)
Patient went to see Dr Alva Garnet who did lab on him and said he is anemic. Patient May appointment was cancelled by physician and he has no future follow up appts with Dr B. Please advise  Contains abnormal data CBC with Differential/Platelet Order: [id number] Status:  Final result  Visible to patient:  No (not released)  Next appt:  03/29/2019 at 09:45 AM in Family Medicine ([facility])  Dx:  Weakness; CLL (chronic lymphocytic le...  Ref Range & Units 2d ago  WBC 4.0 - 10.5 K/uL 25.2High    RBC 4.22 - 5.81 MIL/uL 2.43Low    Hemoglobin 13.0 - 17.0 g/dL 8.0Low    HCT 39.0 - 52.0 % 26.2Low    MCV 80.0 - 100.0 fL 107.8High    MCH 26.0 - 34.0 pg 32.9   MCHC 30.0 - 36.0 g/dL 30.5   RDW 11.5 - 15.5 % 19.3High    Platelets 150 - 400 K/uL 336   nRBC 0.0 - 0.2 % 0.0   Neutrophils Relative % % 70   Neutro Abs 1.7 - 7.7 K/uL 17.8High    Lymphocytes Relative % 4   Lymphs Abs 0.7 - 4.0 K/uL 0.9   Monocytes Relative % 22   Monocytes Absolute 0.1 - 1.0 K/uL 5.5High    Eosinophils Relative % 1   Eosinophils Absolute 0.0 - 0.5 K/uL 0.2   Basophils Relative % 0   Basophils Absolute 0.0 - 0.1 K/uL 0.1   WBC Morphology  MORPHOLOGY UNREMARKABLE   RBC Morphology  MIXED RBC POPULATION   Smear Review  Normal platelet morphology   Immature Granulocytes % 3   Abs Immature Granulocytes 0.00 - 0.07 K/uL 0.62High    Comment: Performed at [facility], [address] [place], [place] [address]  Resulting Agency  [facility]      Specimen Collected: 03/12/19 10:27  Last Resulted: 03/12/19 11:20

## 2019-03-14 NOTE — Telephone Encounter (Signed)
Per dr. Jacinto Reap - md and retracrit only. No labs needed as labs were drawn 2 days ago.

## 2019-03-14 NOTE — Telephone Encounter (Signed)
Talked with daughter. apts provided. Reassurance provided that we would provide exceptional care for her dad. Pt has dementia and hearing loss. Daughter concerned about patient not being able to answer questions or concerns. She will send a list of meds with her dad and a phone number to contact them with questions or concerns. She will be working this apt date in radiology; however, her husband will bring pt to office. They understand they visitors are not allowed in the clinic.

## 2019-03-16 ENCOUNTER — Other Ambulatory Visit: Payer: Self-pay

## 2019-03-16 ENCOUNTER — Encounter: Payer: Self-pay | Admitting: Internal Medicine

## 2019-03-16 ENCOUNTER — Inpatient Hospital Stay: Payer: Medicare Other | Attending: Internal Medicine | Admitting: Internal Medicine

## 2019-03-16 ENCOUNTER — Inpatient Hospital Stay: Payer: Medicare Other | Attending: Internal Medicine

## 2019-03-16 DIAGNOSIS — D4620 Refractory anemia with excess of blasts, unspecified: Secondary | ICD-10-CM

## 2019-03-16 DIAGNOSIS — F0390 Unspecified dementia without behavioral disturbance: Secondary | ICD-10-CM | POA: Insufficient documentation

## 2019-03-16 DIAGNOSIS — I4891 Unspecified atrial fibrillation: Secondary | ICD-10-CM | POA: Diagnosis not present

## 2019-03-16 DIAGNOSIS — D649 Anemia, unspecified: Secondary | ICD-10-CM | POA: Insufficient documentation

## 2019-03-16 DIAGNOSIS — D471 Chronic myeloproliferative disease: Secondary | ICD-10-CM

## 2019-03-16 DIAGNOSIS — I509 Heart failure, unspecified: Secondary | ICD-10-CM | POA: Diagnosis not present

## 2019-03-16 DIAGNOSIS — C61 Malignant neoplasm of prostate: Secondary | ICD-10-CM | POA: Diagnosis not present

## 2019-03-16 DIAGNOSIS — Z87891 Personal history of nicotine dependence: Secondary | ICD-10-CM | POA: Diagnosis not present

## 2019-03-16 DIAGNOSIS — R0609 Other forms of dyspnea: Secondary | ICD-10-CM

## 2019-03-16 DIAGNOSIS — D72828 Other elevated white blood cell count: Secondary | ICD-10-CM | POA: Insufficient documentation

## 2019-03-16 MED ORDER — EPOETIN ALFA-EPBX 40000 UNIT/ML IJ SOLN
40000.0000 [IU] | Freq: Once | INTRAMUSCULAR | Status: AC
  Administered 2019-03-16: 40000 [IU] via SUBCUTANEOUS
  Filled 2019-03-16: qty 1

## 2019-03-16 NOTE — Progress Notes (Signed)
Using the labs that were drawn on 03/12/19 with hgb result of 8.0

## 2019-03-16 NOTE — Assessment & Plan Note (Addendum)
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   # Anemia-suspect secondary to MDS as above hemoglobin 8.0 worsened from baseline. Proceed with retacrit today.  Patient poor candidate for any aggressive therapies.   #Prostate cancer-early stage; currently off therapy/multiple comorbiditie;  November 2019 PSA less than 0.01.  #A. Fib/CHF- on coumadin- stable.   #Intermittent hallucinations/dementia- ?  Worsening.  Risperdol/defer to PCP.   # DISPOSITION:  # retacrit today # Follow-up in 2 weeks- H&H retacrit # follow up MD- 4 weeks- cbc/bmp; retacrit- Dr.B

## 2019-03-16 NOTE — Progress Notes (Signed)
[facility] OFFICE PROGRESS NOTE  Patient Care Team: Elby Beck, FNP as PCP - General (Nurse Practitioner) Allyne Gee, MD (Internal Medicine)   SUMMARY OF HEMATOLOGIC/ONCOLOGIC HISTORY:  # Leucocytosis- 20-30K [Dr.Pandit-[facility]Bx 2013-No definitive features of a Myeloproliferative neoplasia including CMML, normal cytogenetics (76 XY. Normocellular to focally mildly hypocellular marrow for age (30-50%) with trilineage hematopoiesis, no overt monocytosis ; Flow study unremarkable. Peripheral blood flow- suggestive of chronic myelomonocytic leukemia]; JAK2/Bcr-Abl-NEG.2013- [place]- No hepato-splenomegaly.   # Prostate cancer [early stage] previous f/u Dr.Tennenbaum [place]; on surveillance.  # IDA- ? Etiology.  # RA on MXT; prednisone 5mg  discon;    INTERVAL HISTORY: Patient is a poor historian because of dementia  A very pleasant 85 -year-old male patient with a prior history of long-standing leukocytosis/myeloproliferative neoplasm unclassified and mild anemia is here for follow-up.   Patient is currently home.  He has not had any recent hospitalizations.  Had a recent blood work with his PCP that showed a hemoglobin of 8.  Given worsening shortness of breath on exertion is here for retacrit  injections.   Review of Systems  Unable to perform ROS: Dementia     PAST MEDICAL HISTORY :  Past Medical History:  Diagnosis Date  . Arthritis   . Atrial fibrillation ([place])   . CHF (congestive heart failure) ([place])   . COPD (chronic obstructive pulmonary disease) ([place])   . Dysrhythmia   . GERD (gastroesophageal reflux disease)   . Hyperlipemia   . Hypertension   . Leukocytosis 01/14/2016  . Prostate cancer ([place])   . Stroke [facility])     PAST SURGICAL HISTORY :   Past Surgical History:  Procedure Laterality Date  . CHOLECYSTECTOMY    . COLONOSCOPY WITH PROPOFOL N/A 04/04/2015   Procedure: COLONOSCOPY WITH PROPOFOL;  Surgeon: Manya Silvas, MD;  Location[facility]  ENDOSCOPY;  Service: Endoscopy;  Laterality: N/A;  . ESOPHAGOGASTRODUODENOSCOPY N/A 04/04/2015   Procedure: ESOPHAGOGASTRODUODENOSCOPY (EGD);  Surgeon: Manya Silvas, MD;  Location: [facility] ENDOSCOPY;  Service: Endoscopy;  Laterality: N/A;  . EYE SURGERY      FAMILY HISTORY :   Family History  Problem Relation Age of Onset  . Hypertension Other     SOCIAL HISTORY:   Social History   Tobacco Use  . Smoking status: Former Smoker    Years: 20.00    Types: Cigarettes  . Smokeless tobacco: Never Used  Substance Use Topics  . Alcohol use: Yes    Alcohol/week: 1.0 standard drinks    Types: 1 Cans of beer per week    Comment: Wine every once in a while  . Drug use: No    ALLERGIES:  has No Known Allergies.  MEDICATIONS:  Current Outpatient Medications  Medication Sig Dispense Refill  . digoxin (LANOXIN) 0.125 MG tablet TAKE 1 TABLET (0.125 MG TOTAL) EVERY MORNING BY MOUTH. 90 tablet 1  . ferrous sulfate 325 (65 FE) MG tablet Take 325 mg by mouth every morning. Reported on 02/25/2016    . furosemide (LASIX) 20 MG tablet TAKE 0.5-1 TABLETS (10-20 MG TOTAL) BY MOUTH DAILY AS NEEDED. FOR LEG SWELLING. 30 tablet 0  . methotrexate (RHEUMATREX) 2.5 MG tablet TAKE 6 TABLETS (15 MG TOTAL) BY MOUTH EVERY 7 (SEVEN) DAYS.  4  . Multiple Vitamins-Minerals (PRESERVISION AREDS PO) Take 1 tablet by mouth 2 (two) times daily.     Marland Kitchen warfarin (COUMADIN) 4 MG tablet Take 2-4 mg by mouth one time only at 6 PM. 2MG -MONDAY,WEDNESDAY,FRIDAY 4MG -Sunday,TUESDAY,THURSDAY,SATURDAY    .  folic acid (FOLVITE) 1 MG tablet Take 1 mg by mouth daily.     No current facility-administered medications for this visit.     PHYSICAL EXAMINATION: ECOG PERFORMANCE STATUS: \  BP 126/66 (BP Location: Right Arm, Patient Position: Sitting, Cuff Size: Normal)   Pulse 76   Temp 97.7 F (36.5 C) (Tympanic)   Resp (!) 22   Ht 5\' 7"  (1.702 m)   Wt 122 lb (55.3 kg)   BMI 19.11 kg/m   Filed Weights   03/16/19 1402   Weight: 122 lb (55.3 kg)    Physical Exam  Constitutional: He is well-developed, well-nourished, and in no distress.  Frail-appearing Caucasian male patient.  Lives alone.  Is sitting in a wheelchair.  HENT:  Head: Normocephalic and atraumatic.  Mouth/Throat: Oropharynx is clear and moist. No oropharyngeal exudate.  Eyes: Pupils are equal, round, and reactive to light.  Neck: Normal range of motion. Neck supple.  Cardiovascular: Normal rate and regular rhythm.  Pulmonary/Chest: No respiratory distress. He has no wheezes.  Decreased air entry.  Abdominal: Soft. Bowel sounds are normal. He exhibits no distension and no mass. There is no abdominal tenderness. There is no rebound and no guarding.  Musculoskeletal: Normal range of motion.        General: No tenderness or edema.  Neurological: He is alert.  Oriented x1-2.  Skin: Skin is warm.  Chronic bruises noted.  Psychiatric: Affect normal.    LABORATORY DATA:  I have reviewed the data as listed    Component Value Date/Time   NA 137 10/23/2018 1445   NA 140 01/22/2015 0345   K 3.6 10/23/2018 1445   K 3.7 01/22/2015 0345   CL 103 10/23/2018 1445   CL 106 01/22/2015 0345   CO2 25 10/23/2018 1445   CO2 25 01/22/2015 0345   GLUCOSE 124 (H) 10/23/2018 1445   GLUCOSE 89 01/22/2015 0345   BUN 24 (H) 10/23/2018 1445   BUN 16 01/22/2015 0345   CREATININE 1.01 10/23/2018 1445   CREATININE 0.88 01/22/2015 0345   CALCIUM 8.4 (L) 10/23/2018 1445   CALCIUM 7.7 (L) 01/22/2015 0345   PROT 7.1 10/23/2018 1445   PROT 6.8 01/21/2015 1946   ALBUMIN 3.7 10/23/2018 1445   ALBUMIN 3.3 (L) 01/21/2015 1946   AST 18 10/23/2018 1445   AST 20 01/21/2015 1946   ALT 10 10/23/2018 1445   ALT 6 (L) 01/21/2015 1946   ALKPHOS 99 10/23/2018 1445   ALKPHOS 86 01/21/2015 1946   BILITOT 0.6 10/23/2018 1445   BILITOT 0.9 01/21/2015 1946   GFRNONAA >60 10/23/2018 1445   GFRNONAA >60 01/22/2015 0345   GFRAA >60 10/23/2018 1445   GFRAA >60  01/22/2015 0345    No results found for: SPEP, UPEP  Lab Results  Component Value Date   WBC 25.2 (H) 03/12/2019   NEUTROABS 17.8 (H) 03/12/2019   HGB 8.0 (L) 03/12/2019   HCT 26.2 (L) 03/12/2019   MCV 107.8 (H) 03/12/2019   PLT 336 03/12/2019      Chemistry      Component Value Date/Time   NA 137 10/23/2018 1445   NA 140 01/22/2015 0345   K 3.6 10/23/2018 1445   K 3.7 01/22/2015 0345   CL 103 10/23/2018 1445   CL 106 01/22/2015 0345   CO2 25 10/23/2018 1445   CO2 25 01/22/2015 0345   BUN 24 (H) 10/23/2018 1445   BUN 16 01/22/2015 0345   CREATININE 1.01 10/23/2018 1445   CREATININE 0.88  01/22/2015 0345      Component Value Date/Time   CALCIUM 8.4 (L) 10/23/2018 1445   CALCIUM 7.7 (L) 01/22/2015 0345   ALKPHOS 99 10/23/2018 1445   ALKPHOS 86 01/21/2015 1946   AST 18 10/23/2018 1445   AST 20 01/21/2015 1946   ALT 10 10/23/2018 1445   ALT 6 (L) 01/21/2015 1946   BILITOT 0.6 10/23/2018 1445   BILITOT 0.9 01/21/2015 1946         ASSESSMENT & PLAN:   Myeloproliferative neoplasm (HCC) # Leukocytosis predominant neutrophilia- again unclear etiology- 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   # Anemia-suspect secondary to MDS as above hemoglobin 8.0 worsened from baseline. Proceed with retacrit today.  Patient poor candidate for any aggressive therapies.   #Prostate cancer-early stage; currently off therapy/multiple comorbiditie;  November 2019 PSA less than 0.01.  #A. Fib/CHF- on coumadin- stable.   #Intermittent hallucinations/dementia- ?  Worsening.  Risperdol/defer to PCP.   # DISPOSITION:  # retacrit today # Follow-up in 2 weeks- H&H retacrit # follow up MD- 4 weeks- cbc/bmp; retacrit- Dr.B   Cammie Sickle, MD 03/16/2019 3:07 PM

## 2019-03-20 ENCOUNTER — Telehealth: Payer: Self-pay

## 2019-03-20 ENCOUNTER — Telehealth: Payer: Self-pay | Admitting: *Deleted

## 2019-03-20 ENCOUNTER — Other Ambulatory Visit: Payer: Self-pay | Admitting: Hospice and Palliative Medicine

## 2019-03-20 DIAGNOSIS — D4620 Refractory anemia with excess of blasts, unspecified: Secondary | ICD-10-CM

## 2019-03-20 NOTE — Progress Notes (Signed)
Amb referral for palliative care in the home.

## 2019-03-20 NOTE — Telephone Encounter (Signed)
Patient's daughter [name] contacted the cancer center. She states that she doesn't understand why [facility] is "unable to draw at cbc at the inr lab draw visit on 7/1 and why the dept. Doesn't already have these orders." She stressed that it is very difficult to transport her father to  Multiple appointments given his current condition and weakness.  She stated that she wanted to discuss her concerns and lack of coordination of her father's care with administration.  I asked the daughter to allow me time to review the chart and speak to the provider to further collaborate her father's care. reassurance provided to [name] that I will advocate in what ever way I can to help she and the patient. She gave verbal understanding and asked me to contact her back tomorrow.    I reviewed the chart and also contacted Dr. Rogue Bussing. Per Md- he did not have theconversation with the daughter that orders would be sent to [facility].  He will personally reach out to the daughter and further discuss her concerns tomorrow. Per Amanda's note in [place], her covering provider will review the daughter's request as the patient's provider was not available.

## 2019-03-20 NOTE — Telephone Encounter (Signed)
I called today to see if we can reschedule patient's INR check on 7/2 as Cindy and I will be out of the office.  Daughter [name] states that he needs to come that day because he is needing "other labs" drawn to that the cancer center wanted checked including a CBC.  I am not aware of what is needed and she didn't know exactly either but wanted to make you aware.  As Jenny Reichmann and I are out of the office that day, I have asked that Terri route this to an in house covering provider on that Thursday to review results and contact family around dosing and recheck instructions.   This is just an FYI but wanted to make sure all parties were aware.

## 2019-03-20 NOTE — Telephone Encounter (Signed)
VO called to Triage nurse Crystal 

## 2019-03-20 NOTE — Telephone Encounter (Signed)
Daughter called asking foe Home Palliative Care services. Please send a referral in Epic if in agreement

## 2019-03-20 NOTE — Telephone Encounter (Signed)
Spoke to [name] (daughter), explained we are not able to [facility] orders. We are only able to do [facility] clinics orders. She became very upset, is going to call the cancer center, [facility] admin. She feels we should limit her fathers exposure and do all his blood work here. I did explain that a message was also sent to [facility]. Possibly she will place orders.

## 2019-03-20 NOTE — Telephone Encounter (Signed)
I am happy to coordinate lab draws with oncology to minimize the patient's outings. Either oncology can order INR and route to me or I am happy to order needed labs for oncology and route to Dr. Rogue Bussing.

## 2019-03-20 NOTE — Telephone Encounter (Signed)
Order sent.

## 2019-03-20 NOTE — Telephone Encounter (Signed)
Fine- with me; reasonable with palliative care referral at home.  GB

## 2019-03-21 ENCOUNTER — Telehealth: Payer: Self-pay | Admitting: Internal Medicine

## 2019-03-21 ENCOUNTER — Telehealth: Payer: Self-pay | Admitting: Student

## 2019-03-21 DIAGNOSIS — D471 Chronic myeloproliferative disease: Secondary | ICD-10-CM

## 2019-03-21 NOTE — Telephone Encounter (Signed)
Colette, please schedule a lab encounter on 7/2. Thanks.

## 2019-03-21 NOTE — Telephone Encounter (Signed)
Spoke to daughter, Thomas Townsend re: coordinating blood work with PCP for PT/INR. Also, awaiting palliative care evaluation.   Thomas Townsend - I have ordered PT/INR for 7/2. Will forward to PCP re: adjustment of coumadin.  Thomas Townsend- Thank you, for your cooperation.  GB

## 2019-03-21 NOTE — Telephone Encounter (Signed)
Talked with patient's daughter Juanetta Snow and have scheduled an In-Person Palliative consult visit for 03/26/19 2 11:30 AM.  Verbal consent rec'd from [facility]).

## 2019-03-23 ENCOUNTER — Telehealth: Payer: Self-pay | Admitting: Internal Medicine

## 2019-03-23 NOTE — Telephone Encounter (Signed)
x

## 2019-03-26 ENCOUNTER — Telehealth: Payer: Self-pay | Admitting: Pulmonary Disease

## 2019-03-26 ENCOUNTER — Other Ambulatory Visit: Payer: Self-pay

## 2019-03-26 ENCOUNTER — Other Ambulatory Visit: Payer: Medicare Other | Admitting: Student

## 2019-03-26 DIAGNOSIS — Z515 Encounter for palliative care: Secondary | ICD-10-CM | POA: Diagnosis not present

## 2019-03-26 NOTE — Telephone Encounter (Signed)
Order was placed for POC to [facility] on 03/12/2019, however pt has been not contacted as of yet.   Suanne Marker, can you help with this.

## 2019-03-26 NOTE — Telephone Encounter (Signed)
Called and spoke with pt's daughter, [name] and advised her that per [facility] they sent order in for Management review and approval. Levada Dy with [employer] sent an email to check on status of order. [name] was advised and should have an answer for her tomorrow or the next day. Rhonda J Cobb

## 2019-03-26 NOTE — Progress Notes (Signed)
[facility] Consult Note Telephone: [phone]  Fax: [phone]  PATIENT NAME: Thomas Townsend DOB: [birth year] MRN: [id number]  PRIMARY CARE PROVIDER:   Elby Beck, FNP  REFERRING PROVIDER:  Altha Harm, NP  RESPONSIBLE PARTY:  [name], has HCPOA.   ASSESSMENT:  Palliative NP met with Thomas Townsend, daughter [name] and caregiver Clarene Critchley. Explained role of Palliative Medicine. Thomas Townsend did answer direct questions and participated in conversation although he would close his eyes at times. [name] and Clarene Critchley also contributed to conversation. We discussed disease processes. We discussed goals of care. Family would like to keep patient out of the hospital, maintain as much independence as possible, remain in the home and for Thomas Townsend to be comfortable. [name] states that patient has been given prognosis of 6 months x 2 and has 'bounced back." She would like for Thomas Townsend to have upcoming lab work to see if injections are effective. She is uncertain at this time on whether patient will receive blood transfusions if needed. We discussed aggressive vs. Comfort path as well as Palliative vs. Hospice. We discussed symptom management. We briefly discussed advanced care planning. He does have a DNR in the home. [name] would like to discuss MOST form with her four brothers. NP will fill out MOST form on the next visit. Patient and family were given opportunity to ask questions.     RECOMMENDATIONS and PLAN:  1. Code status: DNR; presently in the home. 2. Medical goals of treatment: To meet with Dr. Rogue Bussing after having lab work to discuss if injections have been effective. Follow up with Oncology as scheduled. Palliative Medicine will continue to provide support and make recommendations as needed. 3. Symptom management: shortness of breath-patient is encouraged to wear oxygen via nasal canula at 2 liters per minute. Daughter following up with  Pulmonologist regarding smaller portable tanks.   Palliative Medicine will follow up in 2 weeks or sooner, if needed.   I spent 60 minutes providing this consultation,  from 11:30am to 12:30pm. More than 50% of the time in this consultation was spent coordinating communication.   HISTORY OF PRESENT ILLNESS:  Thomas Townsend is a 85 y.o.  male with multiple medical problems including myelodysplastic syndrome, low grade, myeloproliferative neoplasm, CMML, atrial fibrilation, anemia of chronic disease, COPD, congestive heart failure, dementia, hypertension, aortic stenosis, rheumatoid arthritis. Palliative Care was asked to help address goals of care. Thomas Townsend presently lives at home. He has family support as well as caregiver Clarene Critchley. Clarene Critchley states that patient had been more tired, fatigued and was sleeping more. His appetite varies from poor to fair. Meals are prepared for him; breakfast is his best meal. His last weight was 122 pounds; [name] and Clarene Critchley report patient losing weight and his clothes fitting loser. Weight in 2015 was 150 pounds; 135.8 pounds 08/2018, 126.9 pounds 11/23/2018. He is still able to complete adl's, still continent of bowel and bladder. He is sleeping okay at night. He denies pain or nausea. He does report shortness of breath; he is presently on room air. He has oxygen concentrator in the home. Daughter was following up on smaller portable oxygen tank. He reports a bowel movement 3 days ago; Clarene Critchley states she believes he has had bowel movement more recently. He still goes out to drive his golf cart and mows his yard. He does have a life alert. He last had a blood transfusion about four months ago.Daughter [name] is his HCPOA.  CODE STATUS: DNR  PPS: 60% HOSPICE ELIGIBILITY/DIAGNOSIS: TBD  PAST MEDICAL HISTORY:  Past Medical History:  Diagnosis Date  . Arthritis   . Atrial fibrillation ([place])   . CHF (congestive heart failure) ([place])   . COPD (chronic obstructive  pulmonary disease) ([place])   . Dysrhythmia   . GERD (gastroesophageal reflux disease)   . Hyperlipemia   . Hypertension   . Leukocytosis 01/14/2016  . Prostate cancer ([place])   . Stroke [facility])     SOCIAL HX:  Social History   Tobacco Use  . Smoking status: Former Smoker    Years: 20.00    Types: Cigarettes  . Smokeless tobacco: Never Used  Substance Use Topics  . Alcohol use: Yes    Alcohol/week: 1.0 standard drinks    Types: 1 Cans of beer per week    Comment: Wine every once in a while    ALLERGIES: No Known Allergies   PERTINENT MEDICATIONS:  Outpatient Encounter Medications as of 03/26/2019  Medication Sig  . digoxin (LANOXIN) 0.125 MG tablet TAKE 1 TABLET (0.125 MG TOTAL) EVERY MORNING BY MOUTH.  . ferrous sulfate 325 (65 FE) MG tablet Take 325 mg by mouth every morning. Reported on 7/36/6815  . folic acid (FOLVITE) 1 MG tablet Take 1 mg by mouth daily.  . furosemide (LASIX) 20 MG tablet TAKE 0.5-1 TABLETS (10-20 MG TOTAL) BY MOUTH DAILY AS NEEDED. FOR LEG SWELLING.  . methotrexate (RHEUMATREX) 2.5 MG tablet TAKE 6 TABLETS (15 MG TOTAL) BY MOUTH EVERY 7 (SEVEN) DAYS.  Marland Kitchen Multiple Vitamins-Minerals (PRESERVISION AREDS PO) Take 1 tablet by mouth 2 (two) times daily.   Marland Kitchen warfarin (COUMADIN) 4 MG tablet Take 2-4 mg by mouth one time only at 6 PM. 2MG-MONDAY,WEDNESDAY,FRIDAY 4MG-Sunday,TUESDAY,THURSDAY,SATURDAY   No facility-administered encounter medications on file as of 03/26/2019.     PHYSICAL EXAM:   General: NAD, frail appearing, thin Cardiovascular: regular rate and rhythm Pulmonary: clear ant fields, decreased bases Abdomen: soft, nontender, + bowel sounds GU: no suprapubic tenderness Extremities: no edema, no joint deformities Skin: no rashes Neurological: Weakness but otherwise nonfocal  Ezekiel Slocumb, NP

## 2019-03-26 NOTE — Telephone Encounter (Signed)
Message sent to [facility] Stenson/Angela C with [facility] on status of order. Waiting on response. Rhonda J Cobb

## 2019-03-27 DIAGNOSIS — J189 Pneumonia, unspecified organism: Secondary | ICD-10-CM | POA: Diagnosis not present

## 2019-03-27 DIAGNOSIS — R531 Weakness: Secondary | ICD-10-CM | POA: Diagnosis not present

## 2019-03-28 ENCOUNTER — Telehealth: Payer: Self-pay | Admitting: *Deleted

## 2019-03-28 NOTE — Telephone Encounter (Signed)
Patient's daughter [name]) answered NO to all COVID19 pre screening questions for pt.

## 2019-03-28 NOTE — Telephone Encounter (Signed)
Per Gardiner Rhyme at [facility] POC order has been sent to [facility] Dept to arrange a POC evaluation. Nothing else needed at this time. Rhonda J Cobb

## 2019-03-29 ENCOUNTER — Inpatient Hospital Stay: Payer: Medicare Other

## 2019-03-29 ENCOUNTER — Inpatient Hospital Stay: Payer: Medicare Other | Attending: Internal Medicine

## 2019-03-29 ENCOUNTER — Other Ambulatory Visit: Payer: Medicare Other

## 2019-03-29 ENCOUNTER — Other Ambulatory Visit: Payer: Self-pay

## 2019-03-29 VITALS — BP 114/61 | HR 82

## 2019-03-29 DIAGNOSIS — D4620 Refractory anemia with excess of blasts, unspecified: Secondary | ICD-10-CM | POA: Insufficient documentation

## 2019-03-29 DIAGNOSIS — I4891 Unspecified atrial fibrillation: Secondary | ICD-10-CM | POA: Diagnosis not present

## 2019-03-29 DIAGNOSIS — D72828 Other elevated white blood cell count: Secondary | ICD-10-CM | POA: Insufficient documentation

## 2019-03-29 DIAGNOSIS — D471 Chronic myeloproliferative disease: Secondary | ICD-10-CM

## 2019-03-29 LAB — HEMATOCRIT: HCT: 23.1 % — ABNORMAL LOW (ref 39.0–52.0)

## 2019-03-29 LAB — PROTIME-INR
INR: 2.7 — ABNORMAL HIGH (ref 0.8–1.2)
Prothrombin Time: 28.6 seconds — ABNORMAL HIGH (ref 11.4–15.2)

## 2019-03-29 LAB — HEMOGLOBIN: Hemoglobin: 7 g/dL — ABNORMAL LOW (ref 13.0–17.0)

## 2019-03-29 MED ORDER — EPOETIN ALFA-EPBX 40000 UNIT/ML IJ SOLN
40000.0000 [IU] | Freq: Once | INTRAMUSCULAR | Status: AC
  Administered 2019-03-29: 15:00:00 40000 [IU] via SUBCUTANEOUS
  Filled 2019-03-29: qty 1

## 2019-04-02 ENCOUNTER — Telehealth: Payer: Self-pay

## 2019-04-02 NOTE — Telephone Encounter (Signed)
[name] (DPR signed) said that on 03/29/19 INR was drawn after being ordered from Dr Rogue Bussing but has not heard back yet about PT/INR report; Leafy Ro RN said PT/INR was not transferred to [facility] and INR was 2.7 which is normal. Pt should continue same dose and reck in 4 wks. Coumadin clinic appt scheduled on 04/26/19 at 9:15 AM for coumadin clinic.[name] voiced understanding and when pt comes to [facility] on 04/26/19 [name] does not want pt to come into office and request someone to come out to car for the coumadin clinic appt. Leafy Ro said to send her the note. Also [name] mentioned about hospice coming out to see pt and I advised that hospice could draw PT/INR and call the results to [facility] here at [facility]. [name] said she would ck with Hospice. FYI to [place].

## 2019-04-09 ENCOUNTER — Other Ambulatory Visit: Payer: Self-pay

## 2019-04-09 ENCOUNTER — Telehealth: Payer: Self-pay | Admitting: *Deleted

## 2019-04-09 ENCOUNTER — Other Ambulatory Visit: Payer: Medicare Other | Admitting: Student

## 2019-04-09 DIAGNOSIS — Z515 Encounter for palliative care: Secondary | ICD-10-CM

## 2019-04-09 NOTE — Telephone Encounter (Signed)
Spoke with daughter [name]. Results from last hgb reviewed with patient's daughter. Family is requesting hospice referral. Patient continues to decline. I spoke with Dr. Rogue Bussing and he agrees to be the attending. Hospice referral faxed.

## 2019-04-09 NOTE — Progress Notes (Signed)
[facility] Consult Note Telephone: [phone]  Fax: [phone]  PATIENT NAME: Thomas Townsend DOB: 08-03-1931 MRN: [id number]  PRIMARY CARE PROVIDER:   Elby Beck, FNP  REFERRING PROVIDER:  Altha Harm, NP  RESPONSIBLE PARTY: Daughter, Thomas Townsend    ASSESSMENT: Palliative NP met with patient, daughter Thomas Townsend and caregiver Thomas Townsend. Thomas Townsend was asleep for the first part of visit. We discussed plan of care going forward as they report patient declining further in the past two weeks. Thomas Townsend had stated in past, he did not want to continue treatments. We discussed his advanced disease process and most recent lab work. We discussed patient being eligible for Hospice services due to advanced disease and if he is no longer receiving aggressive treatment such as transfusions or retacrit injections. Thomas Townsend would like for patient to be managed in the home as best as possible. Family is provided education on services hospice can provide for both patient and family. Thomas Townsend placed call to [facility] during visit to speak with nurse also regarding recent lab work. He will be referred to Hospice for assessment. We discussed equipment needs such as hospital bed to assist in breathing as well as for lower extremity edema. Emotional support provided as daughter exhibits some caregiver fatigue.    RECOMMENDATIONS and PLAN:  1. Code status: DNR; presently in the home. 2. Medical goals of treatment: Thomas Townsend will be referred for Hospice assessment due to diagnosis of MDS. 3. Symptom management: shortness of breath-patient is encouraged to wear oxygen via nasal canula at 2 liters per minute. Fall/safety-monitor for falls; continue monitors in the home.  Palliative Medicine will follow through admission to Hospice services.   I spent 60 minutes providing this consultation,  from 11:30am to 12:30pm. More than 50% of the time in this consultation was  spent coordinating communication.   HISTORY OF PRESENT ILLNESS:  Thomas Townsend is a 85 y.o.  male with multiple medical problems including myelodysplastic syndrome, low grade, myeloproliferative neoplasm, CMML, atrial fibrilation, anemia of chronic disease, COPD, congestive heart failure, dementia, hypertension, aortic stenosis, rheumatoid arthritis. Palliative Care was asked to help address goals of care; Thomas Townsend is seen for follow up visit today. Thomas Townsend reports patient having a fall this past Thursday. Thomas Townsend states he was walking to the bathroom. In the past two weeks, he has become weaker, more withdrawn, sleeping more during the day. Thomas Townsend reports patient not sleeping as well some nights. He denies having pain. He is more short of breath. He does wear oxygen at 2 liters per minute, but removes at times. He endorses shortness of breath at rest. He is unable to carry portable tank. Thomas Townsend has contacted pulmonologist regarding smaller tanks, but is awaiting response. His appetite varies; he is eating one good meal daily, but snacks as well. He has lower extremity edema, takes furosemide prn. No bleeding reported.  CODE STATUS: DNR  PPS: 60% HOSPICE ELIGIBILITY/DIAGNOSIS:Yes, MDS.  PAST MEDICAL HISTORY:  Past Medical History:  Diagnosis Date  . Arthritis   . Atrial fibrillation ([place])   . CHF (congestive heart failure) ([place])   . COPD (chronic obstructive pulmonary disease) ([place])   . Dysrhythmia   . GERD (gastroesophageal reflux disease)   . Hyperlipemia   . Hypertension   . Leukocytosis 01/14/2016  . Prostate cancer ([place])   . Stroke [facility])     SOCIAL HX:  Social History   Tobacco Use  . Smoking status: Former Smoker  Years: 20.00    Types: Cigarettes  . Smokeless tobacco: Never Used  Substance Use Topics  . Alcohol use: Yes    Alcohol/week: 1.0 standard drinks    Types: 1 Cans of beer per week    Comment: Wine every once in a while    ALLERGIES: No Known Allergies    PERTINENT MEDICATIONS:  Outpatient Encounter Medications as of 04/09/2019  Medication Sig  . digoxin (LANOXIN) 0.125 MG tablet TAKE 1 TABLET (0.125 MG TOTAL) EVERY MORNING BY MOUTH.  . ferrous sulfate 325 (65 FE) MG tablet Take 325 mg by mouth every morning. Reported on 7/67/2094  . folic acid (FOLVITE) 1 MG tablet Take 1 mg by mouth daily.  . furosemide (LASIX) 20 MG tablet TAKE 0.5-1 TABLETS (10-20 MG TOTAL) BY MOUTH DAILY AS NEEDED. FOR LEG SWELLING.  . methotrexate (RHEUMATREX) 2.5 MG tablet TAKE 6 TABLETS (15 MG TOTAL) BY MOUTH EVERY 7 (SEVEN) DAYS.  Marland Kitchen Multiple Vitamins-Minerals (PRESERVISION AREDS PO) Take 1 tablet by mouth 2 (two) times daily.   Marland Kitchen warfarin (COUMADIN) 4 MG tablet Take 2-4 mg by mouth one time only at 6 PM. 2MG-MONDAY,WEDNESDAY,FRIDAY 4MG-Sunday,TUESDAY,THURSDAY,SATURDAY   No facility-administered encounter medications on file as of 04/09/2019.     PHYSICAL EXAM:   General: NAD, frail appearing, thin Cardiovascular: regular rate and rhythm Pulmonary: clear ant fields Abdomen: soft, nontender, + bowel sounds GU: no suprapubic tenderness Extremities: 2+ edema, no joint deformities Skin: no rashes, contusion to left parietal region of scalp Neurological: Weakness but otherwise nonfocal  Thomas Slocumb, NP

## 2019-04-11 NOTE — Telephone Encounter (Signed)
Daughter [name] called back on 04/09/19 to notify me that her Dad will now be followed under Hospice for his INR checks.  We will cancel his next appointment and they will contact us with the readings for future management.   I called [name] back on her cell number and left message confirming receipt and understanding on 04/11/19.  Appointment cancelled and did ask her to please have hospice send results to our office for management.   FYI to Tor Netters, NP, next INR check due on 04/26/19.  I will make note of this to be watching for reading.

## 2019-04-11 NOTE — Telephone Encounter (Signed)
Noted  

## 2019-04-13 ENCOUNTER — Ambulatory Visit: Payer: Medicare Other | Admitting: Internal Medicine

## 2019-04-13 ENCOUNTER — Other Ambulatory Visit: Payer: Medicare Other

## 2019-04-13 ENCOUNTER — Ambulatory Visit: Payer: Medicare Other

## 2019-04-20 ENCOUNTER — Telehealth: Payer: Self-pay | Admitting: *Deleted

## 2019-04-20 ENCOUNTER — Other Ambulatory Visit: Payer: Self-pay | Admitting: Hospice and Palliative Medicine

## 2019-04-20 MED ORDER — MORPHINE SULFATE (CONCENTRATE) 10 MG /0.5 ML PO SOLN: 5.0000 mg | ORAL | 0 refills | Status: AC | PRN

## 2019-04-20 NOTE — Telephone Encounter (Signed)
Called and spoke with hospice nurse. Will send order for morphine elixir.

## 2019-04-20 NOTE — Progress Notes (Signed)
I spoke with patient's hospice nurse. She says that patient is having generalized pain. He was started on lorazepam for anxiety. Hospitalization was discussed by hospice nurse but family opted to keep him home and comfortable. Will send Rx for morphine elixir.

## 2019-04-20 NOTE — Telephone Encounter (Signed)
Deandra with hospice called asking for prescription for Roxanol for this patient for pain and shortness of breath. Please send if in agreement

## 2019-04-23 ENCOUNTER — Telehealth: Payer: Self-pay

## 2019-04-23 NOTE — Telephone Encounter (Signed)
Thomas Townsend pts daughter left v/m that pt passed away on May 01, 2019 in AM. [name] wanted our office to know since pt was coming here for coumadin cks. FYI to [place], Glenda Chroman FNP who is out of office until 04/30/19, and Dr Damita Dunnings who is in the office.

## 2019-04-23 NOTE — Telephone Encounter (Signed)
Noted.  So sorry to hear this, my condolences to family.

## 2019-04-24 NOTE — Telephone Encounter (Signed)
Noted  

## 2019-04-24 NOTE — Telephone Encounter (Signed)
Noted.  Thanks.  I will route this to PCP as FYI.

## 2019-04-26 ENCOUNTER — Ambulatory Visit: Payer: Medicare Other

## 2019-04-28 DEATH — deceased

## 2020-06-04 IMAGING — DX DG CHEST 1V PORT
2 series · 2 of 2 positions shown · non-contrast
Comparison: March 06, 2018 and December 26, 2017

CLINICAL DATA: Respiratory failure

EXAM:
PORTABLE CHEST 1 VIEW

[chest ap (1 of 2)]
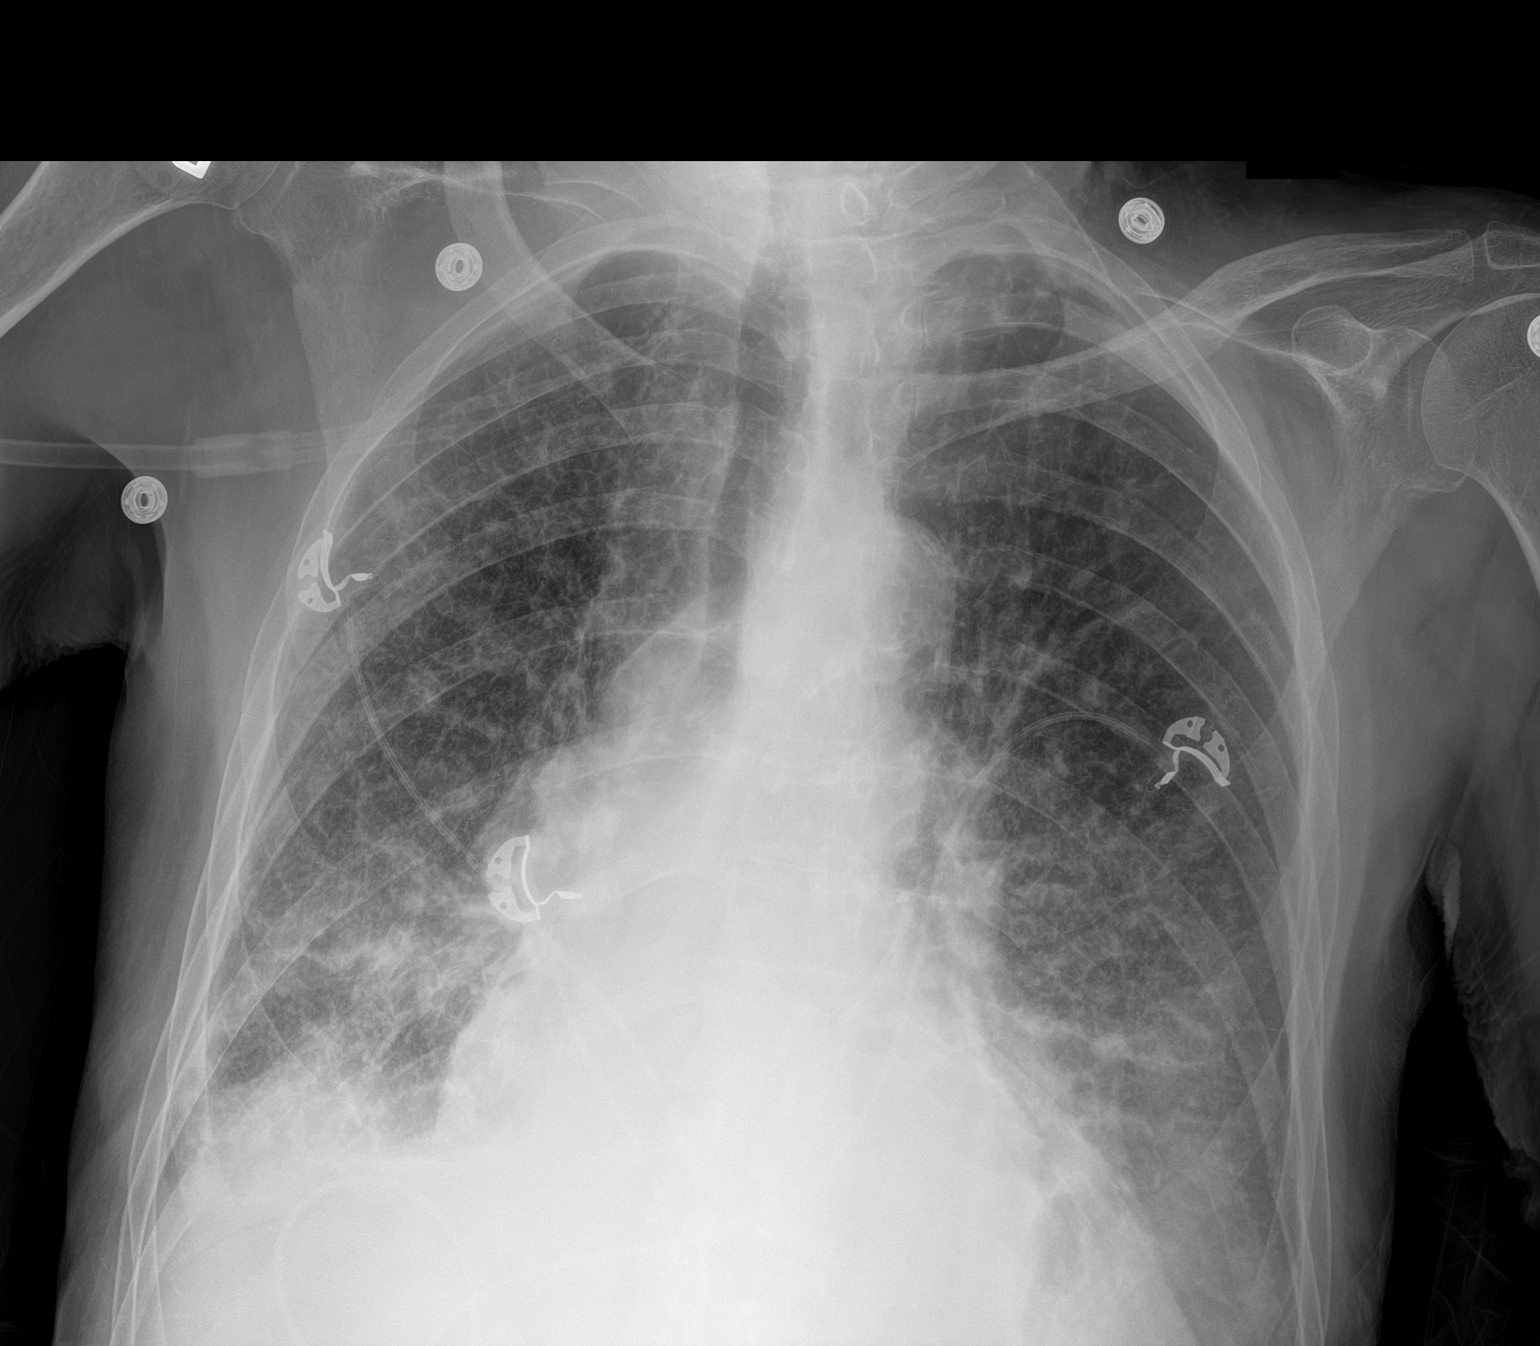

[chest ap (2 of 2)]
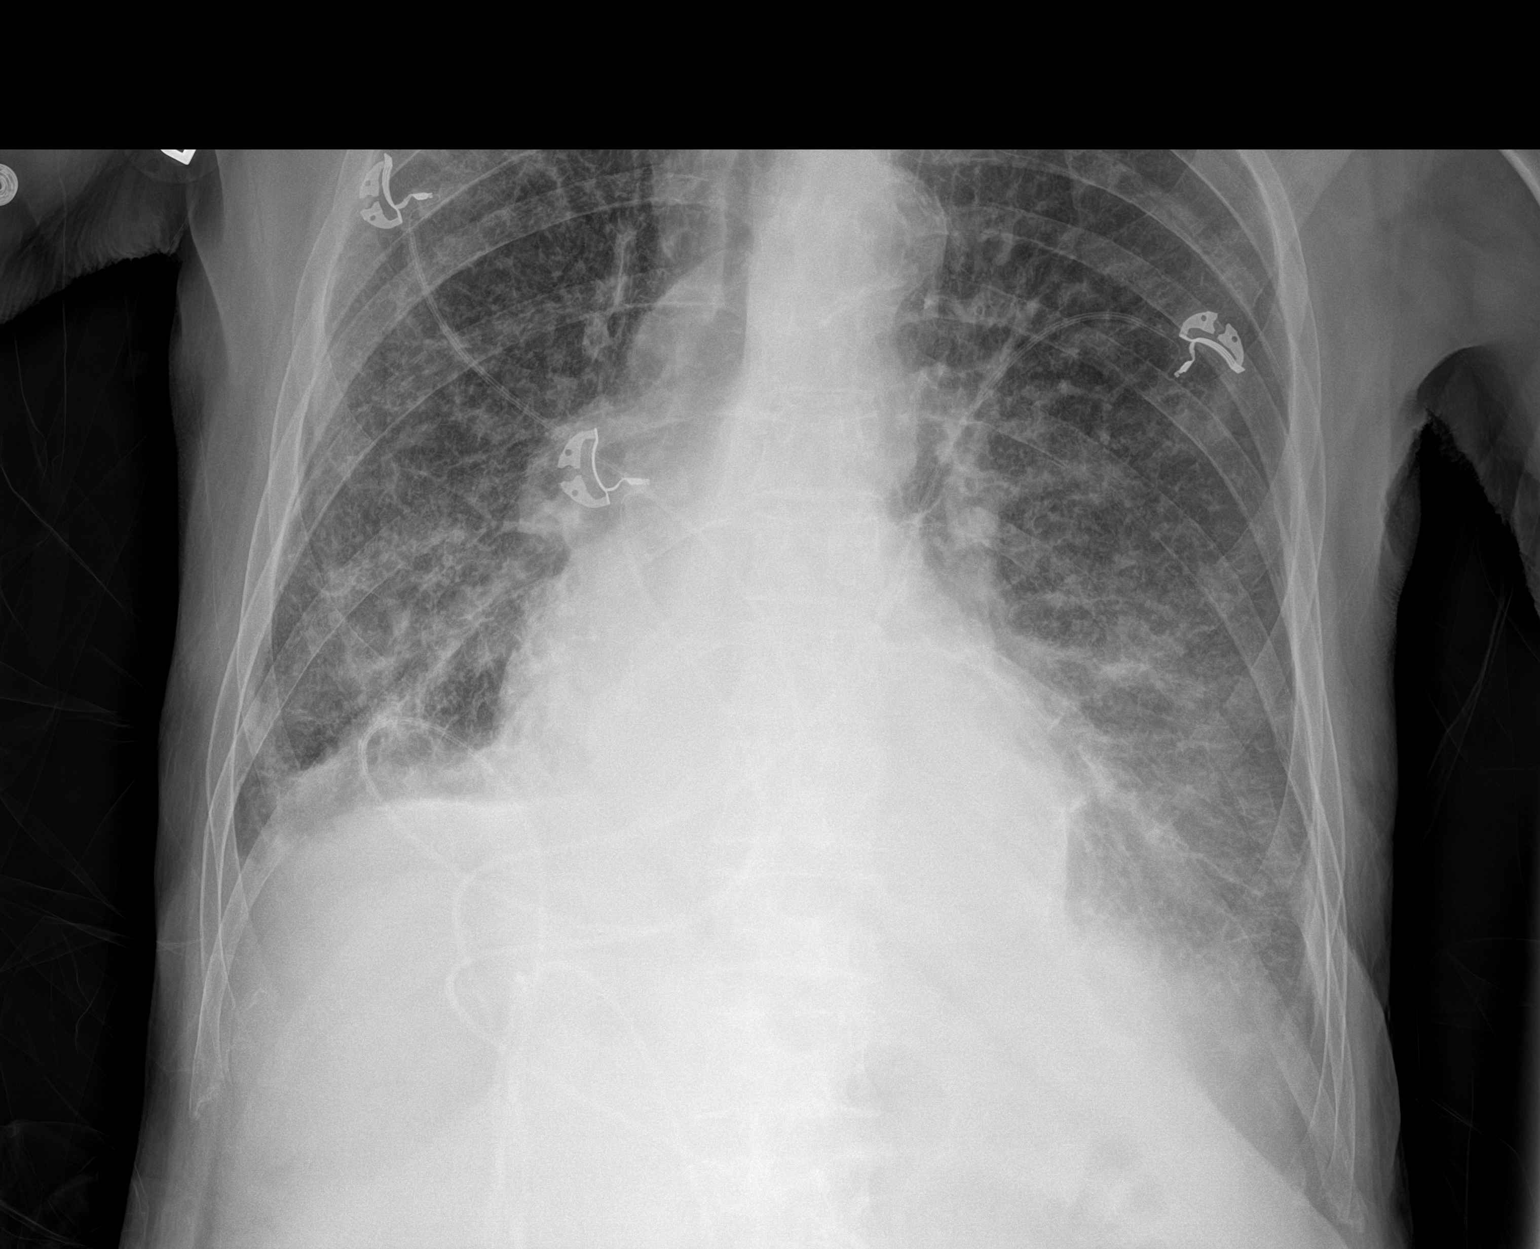

[2 of 2 positions shown; findings below may reference images not displayed]

FINDINGS: There is patchy airspace opacity in the right base, suspicious for a
focal area of pneumonia. There is underlying diffuse interstitial
prominence consistent with chronic fibrotic type change. There is
also felt to be a degree of underlying interstitial pulmonary edema.
There are small pleural effusions bilaterally.

There is cardiomegaly with pulmonary venous hypertension. There is
aortic atherosclerosis. No adenopathy evident. No evident bone
lesions.
IMPRESSION: Underlying chronic interstitial disease with probable degree of
superimposed congestive heart failure. New airspace opacity right
base concerning for developing pneumonia in this area superimposed
on other changes. Cardiac silhouette is stable. There remains aortic
atherosclerosis.

Aortic Atherosclerosis (639Z8-6Z7.7).
